# Patient Record
Sex: Female | Born: 1940 | Hispanic: No | Smoking: Never smoker
Health system: Southern US, Community
[De-identification: ages and names within clinical notes are randomized; demographics above are authoritative.]

## PROBLEM LIST (undated history)

## (undated) DIAGNOSIS — R531 Weakness: Secondary | ICD-10-CM

## (undated) DIAGNOSIS — D649 Anemia, unspecified: Secondary | ICD-10-CM

## (undated) DIAGNOSIS — R55 Syncope and collapse: Secondary | ICD-10-CM

## (undated) DIAGNOSIS — I10 Essential (primary) hypertension: Secondary | ICD-10-CM

## (undated) DIAGNOSIS — M1990 Unspecified osteoarthritis, unspecified site: Secondary | ICD-10-CM

## (undated) DIAGNOSIS — M810 Age-related osteoporosis without current pathological fracture: Secondary | ICD-10-CM

## (undated) DIAGNOSIS — N189 Chronic kidney disease, unspecified: Secondary | ICD-10-CM

## (undated) DIAGNOSIS — E119 Type 2 diabetes mellitus without complications: Secondary | ICD-10-CM

## (undated) DIAGNOSIS — R001 Bradycardia, unspecified: Secondary | ICD-10-CM

## (undated) DIAGNOSIS — H269 Unspecified cataract: Secondary | ICD-10-CM

## (undated) HISTORY — DX: Age-related osteoporosis without current pathological fracture: M81.0

## (undated) HISTORY — DX: Anemia, unspecified: D64.9

## (undated) HISTORY — PX: EYE SURGERY: SHX253

## (undated) HISTORY — DX: Essential (primary) hypertension: I10

## (undated) HISTORY — PX: COLON SURGERY: SHX602

## (undated) HISTORY — DX: Unspecified cataract: H26.9

## (undated) HISTORY — DX: Bradycardia, unspecified: R00.1

## (undated) HISTORY — DX: Chronic kidney disease, unspecified: N18.9

## (undated) HISTORY — DX: Unspecified osteoarthritis, unspecified site: M19.90

## (undated) HISTORY — DX: Weakness: R53.1

## (undated) HISTORY — PX: FRACTURE SURGERY: SHX138

## (undated) HISTORY — DX: Type 2 diabetes mellitus without complications: E11.9

## (undated) HISTORY — DX: Syncope and collapse: R55

## (undated) HISTORY — PX: ABDOMINAL HYSTERECTOMY: SHX81

---

## 2011-06-30 ENCOUNTER — Ambulatory Visit (INDEPENDENT_AMBULATORY_CARE_PROVIDER_SITE_OTHER): Payer: Medicare Other

## 2011-06-30 DIAGNOSIS — E119 Type 2 diabetes mellitus without complications: Secondary | ICD-10-CM

## 2011-06-30 DIAGNOSIS — E7800 Pure hypercholesterolemia, unspecified: Secondary | ICD-10-CM

## 2011-06-30 DIAGNOSIS — Z79899 Other long term (current) drug therapy: Secondary | ICD-10-CM

## 2011-06-30 DIAGNOSIS — I10 Essential (primary) hypertension: Secondary | ICD-10-CM

## 2011-07-22 ENCOUNTER — Ambulatory Visit (INDEPENDENT_AMBULATORY_CARE_PROVIDER_SITE_OTHER): Payer: Medicare Other | Admitting: Family Medicine

## 2011-07-22 VITALS — BP 157/72 | HR 53 | Temp 98.7°F | Resp 18 | Ht 65.5 in | Wt 197.8 lb

## 2011-07-22 DIAGNOSIS — I10 Essential (primary) hypertension: Secondary | ICD-10-CM | POA: Insufficient documentation

## 2011-07-22 DIAGNOSIS — E1165 Type 2 diabetes mellitus with hyperglycemia: Secondary | ICD-10-CM | POA: Insufficient documentation

## 2011-07-22 DIAGNOSIS — E1169 Type 2 diabetes mellitus with other specified complication: Secondary | ICD-10-CM | POA: Insufficient documentation

## 2011-07-22 DIAGNOSIS — E119 Type 2 diabetes mellitus without complications: Secondary | ICD-10-CM

## 2011-07-22 DIAGNOSIS — R5381 Other malaise: Secondary | ICD-10-CM

## 2011-07-22 DIAGNOSIS — R531 Weakness: Secondary | ICD-10-CM

## 2011-07-22 LAB — POCT CBC
Granulocyte percent: 38.4 %G (ref 37–80)
HCT, POC: 37.9 % (ref 37.7–47.9)
Hemoglobin: 11.8 g/dL — AB (ref 12.2–16.2)
Lymph, poc: 2.2 (ref 0.6–3.4)
MCH, POC: 26.4 pg — AB (ref 27–31.2)
MCHC: 31.1 g/dL — AB (ref 31.8–35.4)
MCV: 84.8 fL (ref 80–97)
MID (cbc): 0.2 (ref 0–0.9)
MPV: 13.8 fL (ref 0–99.8)
POC Granulocyte: 1.5 — AB (ref 2–6.9)
POC LYMPH PERCENT: 55.6 %L — AB (ref 10–50)
POC MID %: 6 %M (ref 0–12)
Platelet Count, POC: 140 10*3/uL — AB (ref 142–424)
RBC: 4.47 M/uL (ref 4.04–5.48)
RDW, POC: 15 %
WBC: 4 10*3/uL — AB (ref 4.6–10.2)

## 2011-07-22 LAB — POCT GLYCOSYLATED HEMOGLOBIN (HGB A1C): Hemoglobin A1C: 7

## 2011-07-22 MED ORDER — KETOCONAZOLE 2 % EX CREA: TOPICAL_CREAM | Freq: Two times a day (BID) | CUTANEOUS | Status: AC

## 2011-07-22 MED ORDER — GLIPIZIDE 5 MG PO TABS: 5.0000 mg | ORAL_TABLET | Freq: Every day | ORAL | Status: DC

## 2011-07-22 NOTE — Patient Instructions (Signed)
Weakness Weakness can be caused by many things. Your doctor has checked for the most common causes. HOME CARE  Rest.   Eat a well-balanced diet.   Exercise every day.   Go to follow-up appointments.  GET HELP RIGHT AWAY IF:   There is chest pain or chest pressure.   You have problems breathing.   You cannot do usual daily activities.   You have problems speaking or swallowing.   You have a very bad headache or belly (abdominal) pain.   The heartbeat does not feel normal or the pulse is very fast.   You are confused.   You have vision problems or problems walking.   You have chills.   You or your child has a temperature by mouth above 102 F (38.9 C), not controlled by medicine.   Your baby is older than 3 months with a rectal temperature of 102 F (38.9 C) or higher.   Your baby is 3 months old or younger with a rectal temperature of 100.4 F (38 C) or higher.  MAKE SURE YOU:   Understand these instructions.   Will watch this condition.   Will get help right away if you are not doing well or get worse.  Document Released: 05/20/2008 Document Revised: 02/17/2011 Document Reviewed: 05/20/2008 ExitCare Patient Information 2012 [employer]. 

## 2011-07-22 NOTE — Progress Notes (Signed)
This is a 71 yo woman from [place] who has been having weakness x 3 days with one fall two days ago.  Associated with polyuria, diabetes, hypertension. No chest pain, shortness of breath, bowel problems, abd pain .    Appetite is fine.   Taking meds as prescribed Seen with husband. Speaks [place]  She also has a rash on her face x 5-6 months:  Dry, scaly, getting worse.  O:  Overweight woman in NAD Facial rash:  Dry, scaly papular rash over malar area of face Edentulous Neck normal Chest clear Heart reg with bradycardia no murmur Ext: 1+ pretibial edema  CBC is essentially normal in woman who has always been slightly anemic HgBA1C is 7%  A:  Weakness may be related to diabetes, low potassium, hypothyroidism. Rash suspicious for tinea corporis  P:  Will check labs Hold Valsartan tonight Start Nizoral cream bid Follow up in a week

## 2011-07-24 LAB — COMPREHENSIVE METABOLIC PANEL
ALT: 16 U/L (ref 0–35)
AST: 22 U/L (ref 0–37)
Albumin: 4.1 g/dL (ref 3.5–5.2)
Alkaline Phosphatase: 79 U/L (ref 39–117)
BUN: 14 mg/dL (ref 6–23)
CO2: 27 mEq/L (ref 19–32)
Calcium: 9.1 mg/dL (ref 8.4–10.5)
Chloride: 101 mEq/L (ref 96–112)
Creat: 0.93 mg/dL (ref 0.50–1.10)
Glucose, Bld: 127 mg/dL — ABNORMAL HIGH (ref 70–99)
Potassium: 3.9 mEq/L (ref 3.5–5.3)
Sodium: 137 mEq/L (ref 135–145)
Total Bilirubin: 0.3 mg/dL (ref 0.3–1.2)
Total Protein: 7 g/dL (ref 6.0–8.3)

## 2011-07-24 LAB — TSH: TSH: 0.919 u[IU]/mL (ref 0.350–4.500)

## 2011-07-30 ENCOUNTER — Ambulatory Visit (INDEPENDENT_AMBULATORY_CARE_PROVIDER_SITE_OTHER): Payer: Medicare Other | Admitting: Family Medicine

## 2011-07-30 VITALS — BP 150/78 | HR 48 | Temp 98.2°F | Resp 16 | Ht 63.25 in | Wt 195.4 lb

## 2011-07-30 DIAGNOSIS — I498 Other specified cardiac arrhythmias: Secondary | ICD-10-CM

## 2011-07-30 DIAGNOSIS — R001 Bradycardia, unspecified: Secondary | ICD-10-CM

## 2011-07-30 DIAGNOSIS — R55 Syncope and collapse: Secondary | ICD-10-CM

## 2011-07-30 NOTE — Progress Notes (Signed)
Results for orders placed in visit on 07/22/11  COMPREHENSIVE METABOLIC PANEL      Component Value Range   Sodium 137  135 - 145 (mEq/L)   Potassium 3.9  3.5 - 5.3 (mEq/L)   Chloride 101  96 - 112 (mEq/L)   CO2 27  19 - 32 (mEq/L)   Glucose, Bld 127 (*) 70 - 99 (mg/dL)   BUN 14  6 - 23 (mg/dL)   Creat 1.47  8.29 - 5.62 (mg/dL)   Total Bilirubin 0.3  0.3 - 1.2 (mg/dL)   Alkaline Phosphatase 79  39 - 117 (U/L)   AST 22  0 - 37 (U/L)   ALT 16  0 - 35 (U/L)   Total Protein 7.0  6.0 - 8.3 (g/dL)   Albumin 4.1  3.5 - 5.2 (g/dL)   Calcium 9.1  8.4 - 13.0 (mg/dL)  POCT CBC      Component Value Range   WBC 4.0 (*) 4.6 - 10.2 (K/uL)   Lymph, poc 2.2  0.6 - 3.4    POC LYMPH PERCENT 55.6 (*) 10 - 50 (%L)   MID (cbc) 0.2  0 - 0.9    POC MID % 6.0  0 - 12 (%M)   POC Granulocyte 1.5 (*) 2 - 6.9    Granulocyte percent 38.4  37 - 80 (%G)   RBC 4.47  4.04 - 5.48 (M/uL)   Hemoglobin 11.8 (*) 12.2 - 16.2 (g/dL)   HCT, POC 86.5  78.4 - 47.9 (%)   MCV 84.8  80 - 97 (fL)   MCH, POC 26.4 (*) 27 - 31.2 (pg)   MCHC 31.1 (*) 31.8 - 35.4 (g/dL)   RDW, POC 69.6     Platelet Count, POC 140 (*) 142 - 424 (K/uL)   MPV 13.8  0 - 99.8 (fL)  POCT GLYCOSYLATED HEMOGLOBIN (HGB A1C)      Component Value Range   Hemoglobin A1C 7.0    TSH      Component Value Range   TSH 0.919  0.350 - 4.500 (uIU/mL)  This 71 yo woman from [place] with dizziness.  She gets orthostatic at times and has experienced syncope.  Mother has pacemaker  O:  NAD HEENT unremarkable Chest clear Heart bradycardic, no murmur Abdomen:  Soft, no hsm, nontender, no masses Ext 1+ edema LLE  A:  Bradycardia, symptomatic  Plan:  Cardiologist

## 2011-07-30 NOTE — Patient Instructions (Signed)
Bradycardia Bradycardia is a term for a heart rate (pulse) that, in adults, is slower than 60 beats per minute. A normal rate is 60 to 100 beats per minute. A heart rate below 60 beats per minute may be normal for some adults with healthy hearts. If the rate is too slow, the heart may have trouble pumping the volume of blood the body needs. If the heart rate gets too low, blood flow to the brain may be decreased and may make you feel lightheaded, dizzy, or faint. The heart has a natural pacemaker in the top of the heart called the SA node (sinoatrial or sinus node). This pacemaker sends out regular electrical signals to the muscle of the heart, telling the heart muscle when to beat (contract). The electrical signal travels from the upper parts of the heart (atria) through the AV node (atrioventricular node), to the lower chambers of the heart (ventricles). The ventricles squeeze, pumping the blood from your heart to your lungs and to the rest of your body. CAUSES   Problem with the heart's electrical system.   Problem with the heart's natural pacemaker.   Heart disease, damage, or infection.   Medications.   Problems with minerals and salts (electrolytes).  SYMPTOMS   Fainting (syncope).   Fatigue and weakness.   Shortness of breath (dyspnea).   Chest pain (angina).   Drowsiness.   Confusion.  DIAGNOSIS   An electrocardiogram (ECG) can help your caregiver determine the type of slow heart rate you have.   If the cause is not seen on an ECG, you may need to wear a heart monitor that records your heart rhythm for several hours or days.   Blood tests.  TREATMENT   Electrolyte supplements.   Medications.   Withholding medication which is causing a slow heart rate.   Pacemaker placement.  SEEK IMMEDIATE MEDICAL CARE IF:   You feel lightheaded or faint.   You develop an irregular heart rate.   You feel chest pain or have trouble breathing.  MAKE SURE YOU:   Understand  these instructions.   Will watch your condition.   Will get help right away if you are not doing well or get worse.  Document Released: 02/27/2002 Document Revised: 02/17/2011 Document Reviewed: 01/24/2008 ExitCare Patient Information 2012 [employer]. 

## 2011-08-02 ENCOUNTER — Encounter: Payer: Self-pay | Admitting: *Deleted

## 2011-08-03 ENCOUNTER — Encounter: Payer: Self-pay | Admitting: *Deleted

## 2011-08-03 ENCOUNTER — Ambulatory Visit (INDEPENDENT_AMBULATORY_CARE_PROVIDER_SITE_OTHER): Payer: Medicare Other | Admitting: Cardiovascular Disease

## 2011-08-03 VITALS — BP 143/81 | HR 59 | Ht 65.0 in | Wt 198.0 lb

## 2011-08-03 DIAGNOSIS — E782 Mixed hyperlipidemia: Secondary | ICD-10-CM

## 2011-08-03 DIAGNOSIS — I498 Other specified cardiac arrhythmias: Secondary | ICD-10-CM

## 2011-08-03 DIAGNOSIS — R001 Bradycardia, unspecified: Secondary | ICD-10-CM | POA: Insufficient documentation

## 2011-08-03 DIAGNOSIS — I10 Essential (primary) hypertension: Secondary | ICD-10-CM

## 2011-08-03 DIAGNOSIS — E119 Type 2 diabetes mellitus without complications: Secondary | ICD-10-CM

## 2011-08-03 DIAGNOSIS — R42 Dizziness and giddiness: Secondary | ICD-10-CM

## 2011-08-03 DIAGNOSIS — R55 Syncope and collapse: Secondary | ICD-10-CM

## 2011-08-03 NOTE — Assessment & Plan Note (Signed)
Will review ECG from [facility] ? HR 49  ECG today fine and not on any drugs to lower HR.  Event monitor Currently no obvious indication for pacer

## 2011-08-03 NOTE — Patient Instructions (Signed)
Your physician recommends that you schedule a follow-up appointment in: AFTER TESTS DONE  Your physician recommends that you continue on your current medications as directed. Please refer to the Current Medication list given to you today. Your physician has requested that you have a stress echocardiogram. For further information please visit [email or web address] Please follow instruction sheet as given. DX SYNCOPE Your physician has recommended that you wear an event monitor. Event monitors are medical devices that record the heart's electrical activity. Doctors most often [place] these monitors to diagnose arrhythmias. Arrhythmias are problems with the speed or rhythm of the heartbeat. The monitor is a small, portable device. You can wear one while you do your normal daily activities. This is usually used to diagnose what is causing palpitations/syncope (passing out). 21 DAY  MONITOR  DX SYNCOPE

## 2011-08-03 NOTE — Assessment & Plan Note (Signed)
Cholesterol is at goal.  Continue current dose of statin and diet Rx.  No myalgias or side effects.  F/U  LFT's in 6 months. No results found for this basename: LDLCALC             

## 2011-08-03 NOTE — Progress Notes (Signed)
Patient ID: Catherine Joseph, female   DOB: Oct 24, 1940, 71 y.o.   MRN: [id number] 71 yo [place] female referred by Dr Faustino Congress for bradycardia and dizzyness.  Two weeks ago was getting coffee for her husband and felt weak and dizzy.  Did not fall or hurt herself But felt dizzy for 5-6 minutes and then had a normal day.  Since then continues to feel weak all day and dizzy in the am.  No SSCP.  No previous w/u for structural heart disease.  No history of seizures or neurologic issues.  CRF;s DM/HTN and elevated lipiids.  No history of hypoglycemia.  Labs reviewed from 1/13 and TSH normal not anemic and HbA1c 7.1  ROS: Denies fever, malais, weight loss, blurry vision, decreased visual acuity, cough, sputum, SOB, hemoptysis, pleuritic pain, palpitaitons, heartburn, abdominal pain, melena, lower extremity edema, claudication, or rash.  All other systems reviewed and negative   General: Not postural Affect appropriate Elderly [place] female HEENT: normal Neck supple with no adenopathy JVP normal no bruits no thyromegaly Lungs clear with no wheezing and good diaphragmatic motion Heart:  S1/S2 no murmur,rub, gallop or click PMI normal Abdomen: benighn, BS positve, no tenderness, no AAA no bruit.  No HSM or HJR Distal pulses intact with no bruits No edema Neuro non-focal Skin warm and dry No muscular weakness  Medications Current Outpatient Prescriptions  Medication Sig Dispense Refill  . glipiZIDE (GLUCOTROL) 5 MG tablet Take 1 tablet (5 mg total) by mouth daily.  30 tablet  3  . ketoconazole (NIZORAL) 2 % cream Apply topically 2 (two) times daily.  30 g  0  . simvastatin (ZOCOR) 40 MG tablet Take 40 mg by mouth every evening.      . valsartan-hydrochlorothiazide (DIOVAN-HCT) 160-25 MG per tablet Take 1 tablet by mouth daily.        Allergies Codeine  Family History: No family history on file.  Social History: History   Social History  . Marital Status: Married    Spouse Name:  N/A    Number of Children: N/A  . Years of Education: N/A   Occupational History  . Not on file.   Social History Main Topics  . Smoking status: Never Smoker   . Smokeless tobacco: Not on file  . Alcohol Use: Not on file  . Drug Use: Not on file  . Sexually Active: Not on file   Other Topics Concern  . Not on file   Social History Narrative  . No narrative on file    Electrocardiogram:  NSR rate 69 PVC;s no conduction abnormalities and no ischemia  Assessment and Plan

## 2011-08-03 NOTE — Assessment & Plan Note (Signed)
Discussed low carb diet.  Target hemoglobin A1c is 6.5 or less.  Continue current medications.  

## 2011-08-03 NOTE — Assessment & Plan Note (Signed)
Not clear that these are related to her heart.  ECG in our office SR 69 with no conduction abnormality.  Will get event monitor, and stress echo.  Stress echo will allow [place] to see HR response to exercise and R/O structural heart disease in regard to her PVC;s.

## 2011-08-03 NOTE — Assessment & Plan Note (Signed)
Well controlled.  Continue current medications and low sodium Dash type diet.    

## 2011-08-13 ENCOUNTER — Encounter (INDEPENDENT_AMBULATORY_CARE_PROVIDER_SITE_OTHER): Payer: Medicare Other

## 2011-08-13 DIAGNOSIS — R55 Syncope and collapse: Secondary | ICD-10-CM

## 2011-08-20 ENCOUNTER — Other Ambulatory Visit (HOSPITAL_COMMUNITY): Payer: Medicare Other

## 2011-08-27 ENCOUNTER — Ambulatory Visit (HOSPITAL_COMMUNITY): Payer: Medicare Other | Attending: Cardiovascular Disease

## 2011-08-27 DIAGNOSIS — I498 Other specified cardiac arrhythmias: Secondary | ICD-10-CM | POA: Insufficient documentation

## 2011-08-27 DIAGNOSIS — I4949 Other premature depolarization: Secondary | ICD-10-CM

## 2011-08-27 DIAGNOSIS — R55 Syncope and collapse: Secondary | ICD-10-CM

## 2011-08-27 DIAGNOSIS — E119 Type 2 diabetes mellitus without complications: Secondary | ICD-10-CM | POA: Insufficient documentation

## 2011-08-27 DIAGNOSIS — I10 Essential (primary) hypertension: Secondary | ICD-10-CM | POA: Insufficient documentation

## 2011-08-27 DIAGNOSIS — R42 Dizziness and giddiness: Secondary | ICD-10-CM | POA: Insufficient documentation

## 2011-08-27 DIAGNOSIS — E785 Hyperlipidemia, unspecified: Secondary | ICD-10-CM | POA: Insufficient documentation

## 2011-09-13 ENCOUNTER — Telehealth: Payer: Self-pay | Admitting: *Deleted

## 2011-09-13 NOTE — Telephone Encounter (Signed)
PT AWARE OF MONITOR RESULTS SR,PAC ,PVC'S NO SIG ARRYTHMIA./CY

## 2011-09-27 ENCOUNTER — Encounter: Payer: Self-pay | Admitting: Internal Medicine

## 2011-09-27 ENCOUNTER — Ambulatory Visit (INDEPENDENT_AMBULATORY_CARE_PROVIDER_SITE_OTHER): Payer: Medicare Other | Admitting: Internal Medicine

## 2011-09-27 VITALS — BP 119/74 | HR 62 | Temp 97.9°F | Resp 16 | Ht 63.5 in | Wt 197.0 lb

## 2011-09-27 DIAGNOSIS — E119 Type 2 diabetes mellitus without complications: Secondary | ICD-10-CM

## 2011-09-27 DIAGNOSIS — Z79899 Other long term (current) drug therapy: Secondary | ICD-10-CM

## 2011-09-27 DIAGNOSIS — R001 Bradycardia, unspecified: Secondary | ICD-10-CM

## 2011-09-27 DIAGNOSIS — IMO0001 Reserved for inherently not codable concepts without codable children: Secondary | ICD-10-CM

## 2011-09-27 DIAGNOSIS — I10 Essential (primary) hypertension: Secondary | ICD-10-CM

## 2011-09-27 NOTE — Progress Notes (Signed)
  Subjective:    Patient ID: NALA KACHEL, female    DOB: Jun 23, 1940, 71 y.o.   MRN: [id number]  HPI New bradycardia and dizzy-- Dr. Genelle Gather is evaluating Niddm is stable HTN controlled  Meds reviewed  Review of Systems     Objective:   Physical Exam  Constitutional: She is oriented to person, place, and time. She appears well-developed and well-nourished.  HENT:  Head: Normocephalic.  Eyes: EOM are normal.  Neck: Normal range of motion. Neck supple. No thyromegaly present.  Cardiovascular: Normal rate, regular rhythm and normal heart sounds.   Pulmonary/Chest: Effort normal and breath sounds normal.  Abdominal: Soft. Bowel sounds are normal.  Neurological: She is alert and oriented to person, place, and time. Coordination normal.  Skin: Skin is warm and dry.  Psychiatric: She has a normal mood and affect.  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 RF meds 4 mos

## 2011-09-28 ENCOUNTER — Ambulatory Visit (INDEPENDENT_AMBULATORY_CARE_PROVIDER_SITE_OTHER): Payer: Medicare Other | Admitting: Cardiovascular Disease

## 2011-09-28 ENCOUNTER — Ambulatory Visit: Payer: Medicare Other | Admitting: Cardiovascular Disease

## 2011-09-28 ENCOUNTER — Encounter: Payer: Self-pay | Admitting: Cardiovascular Disease

## 2011-09-28 VITALS — BP 134/62 | HR 56 | Ht 63.0 in | Wt 197.8 lb

## 2011-09-28 DIAGNOSIS — R001 Bradycardia, unspecified: Secondary | ICD-10-CM

## 2011-09-28 DIAGNOSIS — E782 Mixed hyperlipidemia: Secondary | ICD-10-CM

## 2011-09-28 DIAGNOSIS — E119 Type 2 diabetes mellitus without complications: Secondary | ICD-10-CM

## 2011-09-28 DIAGNOSIS — I498 Other specified cardiac arrhythmias: Secondary | ICD-10-CM

## 2011-09-28 DIAGNOSIS — I10 Essential (primary) hypertension: Secondary | ICD-10-CM

## 2011-09-28 NOTE — Assessment & Plan Note (Signed)
Cholesterol is at goal.  Continue current dose of statin and diet Rx.  No myalgias or side effects.  F/U  LFT's in 6 months. No results found for this basename: LDLCALC             

## 2011-09-28 NOTE — Patient Instructions (Signed)
Your physician wants you to follow-up in:  6 MONTHS WITH DR NISHAN  You will receive a reminder letter in the mail two months in advance. If you don't receive a letter, please call our office to schedule the follow-up appointment. Your physician recommends that you continue on your current medications as directed. Please refer to the Current Medication list given to you today. 

## 2011-09-28 NOTE — Assessment & Plan Note (Signed)
Only checked BS once/week.  F/U Dr Perrin Maltese Continue oral hypoglycemics

## 2011-09-28 NOTE — Assessment & Plan Note (Signed)
Pulse deficit at radial from PAC/PVC  Event monitor benign and no episodes of dizzyness. Observe

## 2011-09-28 NOTE — Assessment & Plan Note (Signed)
Well controlled.  Continue current medications and low sodium Dash type diet.    

## 2011-09-28 NOTE — Progress Notes (Signed)
71 yo [place] female referred by Dr Faustino Congress 2/13  for bradycardia and dizzyness. Two weeks ago was getting coffee for her husband and felt weak and dizzy. Did not fall or hurt herself   But felt dizzy for 5-6 minutes and then had a normal day. Since then continues to feel weak all day and dizzy in the am. No SSCP. No previous w/u for structural heart disease. No history of seizures or neurologic issues. CRF;s DM/HTN and elevated lipiids. No history of hypoglycemia. Labs reviewed from 1/13 and TSH normal not anemic and HbA1c 7.1  Normal stress echo 08/27/11 Reviewed event monitor SR PAC/PVC no significant arrhythmia  ROS: Denies fever, malais, weight loss, blurry vision, decreased visual acuity, cough, sputum, SOB, hemoptysis, pleuritic pain, palpitaitons, heartburn, abdominal pain, melena, lower extremity edema, claudication, or rash.  All other systems reviewed and negative  General: Affect appropriate Healthy:  appears stated age HEENT: normal Neck supple with no adenopathy JVP normal no bruits no thyromegaly Lungs clear with no wheezing and good diaphragmatic motion Heart:  S1/S2 no murmur, no rub, gallop or click PMI normal Abdomen: benighn, BS positve, no tenderness, no AAA no bruit.  No HSM or HJR Distal pulses intact with no bruits No edema Neuro non-focal Skin warm and dry No muscular weakness   Current Outpatient Prescriptions  Medication Sig Dispense Refill  . glipiZIDE (GLUCOTROL) 5 MG tablet Take 1 tablet (5 mg total) by mouth daily.  30 tablet  3  . ketoconazole (NIZORAL) 2 % cream Apply topically 2 (two) times daily.  30 g  0  . simvastatin (ZOCOR) 40 MG tablet Take 40 mg by mouth every evening.      . valsartan-hydrochlorothiazide (DIOVAN-HCT) 160-25 MG per tablet Take 1 tablet by mouth daily.        Allergies  Codeine  Electrocardiogram:  Assessment and Plan

## 2011-12-06 ENCOUNTER — Other Ambulatory Visit: Payer: Self-pay | Admitting: Internal Medicine

## 2012-01-19 ENCOUNTER — Ambulatory Visit (INDEPENDENT_AMBULATORY_CARE_PROVIDER_SITE_OTHER): Payer: Medicare Other | Admitting: Emergency Medicine

## 2012-01-19 ENCOUNTER — Ambulatory Visit: Payer: Medicare Other

## 2012-01-19 VITALS — BP 140/82 | HR 66 | Temp 99.3°F | Resp 16 | Ht 63.0 in | Wt 201.0 lb

## 2012-01-19 DIAGNOSIS — R05 Cough: Secondary | ICD-10-CM

## 2012-01-19 DIAGNOSIS — J329 Chronic sinusitis, unspecified: Secondary | ICD-10-CM

## 2012-01-19 DIAGNOSIS — I517 Cardiomegaly: Secondary | ICD-10-CM

## 2012-01-19 DIAGNOSIS — R059 Cough, unspecified: Secondary | ICD-10-CM

## 2012-01-19 DIAGNOSIS — J309 Allergic rhinitis, unspecified: Secondary | ICD-10-CM

## 2012-01-19 MED ORDER — FLUTICASONE PROPIONATE 50 MCG/ACT NA SUSP: 2.0000 | Freq: Every day | NASAL | Status: DC

## 2012-01-19 MED ORDER — BENZONATATE 100 MG PO CAPS: 100.0000 mg | ORAL_CAPSULE | Freq: Three times a day (TID) | ORAL | Status: AC | PRN

## 2012-01-19 MED ORDER — AMOXICILLIN 500 MG PO CAPS: 500.0000 mg | ORAL_CAPSULE | Freq: Two times a day (BID) | ORAL | Status: AC

## 2012-01-19 MED ORDER — BENZONATATE 100 MG PO CAPS: 100.0000 mg | ORAL_CAPSULE | Freq: Three times a day (TID) | ORAL | Status: DC | PRN

## 2012-01-19 MED ORDER — AMOXICILLIN 500 MG PO CAPS: 500.0000 mg | ORAL_CAPSULE | Freq: Two times a day (BID) | ORAL | Status: DC

## 2012-01-19 NOTE — Progress Notes (Signed)
  Subjective:    Patient ID: Catherine Joseph, female    DOB: June 24, 1940, 71 y.o.   MRN: [id number]  HPI patient states that 8 days ago she started developing symptoms of head congestion associated with postnasal drip and a dry cough the she now has swelling across her face and discomfort when she blows her nose. She denies a productive cough but states she has been running some fever. She has not taken any medications because she has diabetes high blood in her cholesterol to    Review of Systems     Objective:   Physical Exam  Constitutional: She appears well-developed and well-nourished.  HENT:  Head: Normocephalic.  Right Ear: External ear normal.  Left Ear: External ear normal.       She is a significant amount of nasal congestion. I did not see any purulent drainage  Eyes: Pupils are equal, round, and reactive to light.  Neck: No tracheal deviation present.  Pulmonary/Chest: Effort normal and breath sounds normal.    [facility] reading (PRIMARY) by  Dr. Cleta Alberts there is cardiomegaly there is a prominent node in the right hilar area. There is no definite evidence of sinusitis .       Assessment & Plan:   We'll go ahead and cover with amoxicillin and Flonase and Tessalon Perles

## 2012-01-21 ENCOUNTER — Other Ambulatory Visit: Payer: Self-pay | Admitting: Radiology

## 2012-01-21 DIAGNOSIS — R9389 Abnormal findings on diagnostic imaging of other specified body structures: Secondary | ICD-10-CM

## 2012-01-25 ENCOUNTER — Other Ambulatory Visit: Payer: Self-pay | Admitting: Emergency Medicine

## 2012-01-27 ENCOUNTER — Other Ambulatory Visit: Payer: Medicare Other

## 2012-01-27 ENCOUNTER — Ambulatory Visit
Admission: RE | Admit: 2012-01-27 | Discharge: 2012-01-27 | Disposition: A | Discharge status: Home / Self Care | Payer: Medicare Other | Source: Ambulatory Visit | Attending: Emergency Medicine | Admitting: Emergency Medicine

## 2012-01-27 DIAGNOSIS — R9389 Abnormal findings on diagnostic imaging of other specified body structures: Secondary | ICD-10-CM

## 2012-01-27 MED ORDER — IOHEXOL 300 MG/ML  SOLN
75.0000 mL | Freq: Once | INTRAMUSCULAR | Status: AC | PRN
  Administered 2012-01-27: 75 mL via INTRAVENOUS

## 2012-01-31 ENCOUNTER — Other Ambulatory Visit: Payer: Self-pay | Admitting: Radiology

## 2012-01-31 DIAGNOSIS — R9389 Abnormal findings on diagnostic imaging of other specified body structures: Secondary | ICD-10-CM

## 2012-02-01 ENCOUNTER — Ambulatory Visit (INDEPENDENT_AMBULATORY_CARE_PROVIDER_SITE_OTHER): Payer: Medicare Other | Admitting: Internal Medicine

## 2012-02-01 VITALS — BP 144/82 | HR 72 | Temp 98.9°F | Resp 17 | Ht 63.0 in | Wt 200.0 lb

## 2012-02-01 DIAGNOSIS — R059 Cough, unspecified: Secondary | ICD-10-CM

## 2012-02-01 DIAGNOSIS — R05 Cough: Secondary | ICD-10-CM

## 2012-02-01 DIAGNOSIS — R911 Solitary pulmonary nodule: Secondary | ICD-10-CM

## 2012-02-01 DIAGNOSIS — M546 Pain in thoracic spine: Secondary | ICD-10-CM

## 2012-02-01 MED ORDER — HYDROCODONE-ACETAMINOPHEN 7.5-500 MG/15ML PO SOLN: 5.0000 mL | Freq: Four times a day (QID) | ORAL | Status: AC | PRN

## 2012-02-01 NOTE — Progress Notes (Signed)
  Subjective:    Patient ID: Catherine Joseph, female    DOB: 21-Feb-1941, 71 y.o.   MRN: [id number]  HPI Has thoracic back pain on the right, recovering from a bad cough. 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 Counseled at length on nodule and low risk.   Review of Systems     Objective:   Physical Exam        Assessment & Plan:

## 2012-03-08 ENCOUNTER — Other Ambulatory Visit: Payer: Self-pay | Admitting: Physician Assistant

## 2012-03-08 ENCOUNTER — Telehealth: Payer: Self-pay

## 2012-03-08 NOTE — Telephone Encounter (Signed)
PATIENT CALLED REQUESTING REFILL ON PRESCRIPTION (valsartan-hydrochlorothiazide) THAT SHE WOULD LIKE REFILLED TOMORROW BECAUSE SHE RAN OUT. PLEASE CONTACT PATIENT AT HOME NUMBER. THANK YOU!

## 2012-03-08 NOTE — Telephone Encounter (Signed)
Medication refill sent in. 

## 2012-03-09 NOTE — Telephone Encounter (Signed)
Notified pt RF was sent in

## 2012-03-31 ENCOUNTER — Ambulatory Visit (INDEPENDENT_AMBULATORY_CARE_PROVIDER_SITE_OTHER): Payer: Medicare Other | Admitting: Family Medicine

## 2012-03-31 DIAGNOSIS — Z23 Encounter for immunization: Secondary | ICD-10-CM

## 2012-05-01 ENCOUNTER — Encounter: Payer: Self-pay | Admitting: Internal Medicine

## 2012-05-01 ENCOUNTER — Ambulatory Visit (INDEPENDENT_AMBULATORY_CARE_PROVIDER_SITE_OTHER): Payer: Medicare Other | Admitting: Internal Medicine

## 2012-05-01 ENCOUNTER — Other Ambulatory Visit: Payer: Self-pay | Admitting: Physician Assistant

## 2012-05-01 VITALS — BP 130/76 | HR 44 | Temp 97.9°F | Resp 16 | Ht 63.0 in | Wt 196.8 lb

## 2012-05-01 DIAGNOSIS — H6120 Impacted cerumen, unspecified ear: Secondary | ICD-10-CM

## 2012-05-01 DIAGNOSIS — B353 Tinea pedis: Secondary | ICD-10-CM

## 2012-05-01 DIAGNOSIS — Z23 Encounter for immunization: Secondary | ICD-10-CM

## 2012-05-01 DIAGNOSIS — D509 Iron deficiency anemia, unspecified: Secondary | ICD-10-CM

## 2012-05-01 DIAGNOSIS — Z79899 Other long term (current) drug therapy: Secondary | ICD-10-CM

## 2012-05-01 DIAGNOSIS — Z0000 Encounter for general adult medical examination without abnormal findings: Secondary | ICD-10-CM

## 2012-05-01 DIAGNOSIS — E119 Type 2 diabetes mellitus without complications: Secondary | ICD-10-CM

## 2012-05-01 DIAGNOSIS — Z139 Encounter for screening, unspecified: Secondary | ICD-10-CM

## 2012-05-01 DIAGNOSIS — T169XXA Foreign body in ear, unspecified ear, initial encounter: Secondary | ICD-10-CM

## 2012-05-01 DIAGNOSIS — R911 Solitary pulmonary nodule: Secondary | ICD-10-CM

## 2012-05-01 DIAGNOSIS — E782 Mixed hyperlipidemia: Secondary | ICD-10-CM

## 2012-05-01 DIAGNOSIS — I4949 Other premature depolarization: Secondary | ICD-10-CM

## 2012-05-01 DIAGNOSIS — M795 Residual foreign body in soft tissue: Secondary | ICD-10-CM

## 2012-05-01 DIAGNOSIS — R21 Rash and other nonspecific skin eruption: Secondary | ICD-10-CM

## 2012-05-01 DIAGNOSIS — I10 Essential (primary) hypertension: Secondary | ICD-10-CM

## 2012-05-01 LAB — POCT UA - MICROSCOPIC ONLY: Bacteria, U Microscopic: NEGATIVE

## 2012-05-01 LAB — POCT URINALYSIS DIPSTICK
Bilirubin, UA: NEGATIVE
Glucose, UA: NEGATIVE
Ketones, UA: NEGATIVE
Spec Grav, UA: 1.025

## 2012-05-01 LAB — CBC WITH DIFFERENTIAL/PLATELET
Basophils Absolute: 0 10*3/uL (ref 0.0–0.1)
HCT: 40.9 % (ref 36.0–46.0)
Hemoglobin: 14.1 g/dL (ref 12.0–15.0)
Lymphocytes Relative: 64 % — ABNORMAL HIGH (ref 12–46)
Monocytes Absolute: 0.3 10*3/uL (ref 0.1–1.0)
Monocytes Relative: 8 % (ref 3–12)
Neutro Abs: 0.9 10*3/uL — ABNORMAL LOW (ref 1.7–7.7)
RDW: 14.2 % (ref 11.5–15.5)
WBC: 3.5 10*3/uL — ABNORMAL LOW (ref 4.0–10.5)

## 2012-05-01 LAB — POCT GLYCOSYLATED HEMOGLOBIN (HGB A1C): Hemoglobin A1C: 8.3

## 2012-05-01 LAB — COMPREHENSIVE METABOLIC PANEL
AST: 17 U/L (ref 0–37)
Albumin: 4.2 g/dL (ref 3.5–5.2)
BUN: 17 mg/dL (ref 6–23)
Calcium: 9.4 mg/dL (ref 8.4–10.5)
Chloride: 102 mEq/L (ref 96–112)
Potassium: 3.8 mEq/L (ref 3.5–5.3)
Total Protein: 7.5 g/dL (ref 6.0–8.3)

## 2012-05-01 LAB — LIPID PANEL
LDL Cholesterol: 127 mg/dL — ABNORMAL HIGH (ref 0–99)
Triglycerides: 99 mg/dL (ref <150)

## 2012-05-01 MED ORDER — TERBINAFINE HCL 250 MG PO TABS: 250.0000 mg | ORAL_TABLET | Freq: Every day | ORAL | Status: DC

## 2012-05-01 MED ORDER — METFORMIN HCL ER 500 MG PO TB24: 500.0000 mg | ORAL_TABLET | Freq: Every day | ORAL | Status: DC

## 2012-05-01 MED ORDER — VALSARTAN-HYDROCHLOROTHIAZIDE 160-25 MG PO TABS: 1.0000 | ORAL_TABLET | Freq: Every day | ORAL | Status: DC

## 2012-05-01 NOTE — Patient Instructions (Signed)
Athlete's Foot  Athlete's foot (tinea pedis) is a fungal infection of the skin on the feet. It often occurs on the skin between the toes or underneath the toes. It can also occur on the soles of the feet. Athlete's foot is more likely to occur in hot, humid weather. Not washing your feet or changing your socks often enough can contribute to athlete's foot. The infection can spread from person to person (contagious).  CAUSES  Athlete's foot is caused by a fungus. This fungus thrives in warm, moist places. Most people get athlete's foot by sharing shower stalls, towels, and wet floors with an infected person. People with weakened immune systems, including those with diabetes, may be more likely to get athlete's foot.  SYMPTOMS     Itchy areas between the toes or on the soles of the feet.   White, flaky, or scaly areas between the toes or on the soles of the feet.   Tiny, intensely itchy blisters between the toes or on the soles of the feet.   Tiny cuts on the skin. These cuts can develop a bacterial infection.   Thick or discolored toenails.  DIAGNOSIS    Your caregiver can usually tell what the problem is by doing a physical exam. Your caregiver may also take a skin sample from the rash area. The skin sample may be examined under a microscope, or it may be tested to see if fungus will grow in the sample. A sample may also be taken from your toenail for testing.  TREATMENT    Over-the-counter and prescription medicines can be used to kill the fungus. These medicines are available as powders or creams. Your caregiver can suggest medicines for you. Fungal infections respond slowly to treatment. You may need to continue using your medicine for several weeks.  PREVENTION     Do not share towels.   Wear sandals in wet areas, such as shared locker rooms and shared showers.   Keep your feet dry. Wear shoes that allow air to circulate. Wear cotton or wool socks.  HOME CARE INSTRUCTIONS      Take medicines as directed by your caregiver. Do not use steroid creams on athlete's foot.   Keep your feet clean and cool. Wash your feet daily and dry them thoroughly, especially between your toes.   Change your socks every day. Wear cotton or wool socks. In hot climates, you may need to change your socks 2 to 3 times per day.   Wear sandals or canvas tennis shoes with good air circulation.   If you have blisters, soak your feet in Burow's solution or Epsom salts for 20 to 30 minutes, 2 times a day to dry out the blisters. Make sure you dry your feet thoroughly afterward.  SEEK MEDICAL CARE IF:     You have a fever.   You have swelling, soreness, warmth, or redness in your foot.   You are not getting better after 7 days of treatment.   You are not completely cured after 30 days.   You have any problems caused by your medicines.  MAKE SURE YOU:     Understand these instructions.   Will watch your condition.   Will get help right away if you are not doing well or get worse.  Document Released: 06/04/2000 Document Revised: 08/30/2011 Document Reviewed: 03/26/2011  ExitCare Patient Information 2013 [employer].

## 2012-05-01 NOTE — Progress Notes (Signed)
Subjective:    Patient ID: Catherine Joseph, female    DOB: 29-Jan-1941, 71 y.o.   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 Needs shingles and pneumonia vacs. Last colonoscopy13 yrs ago Had some nausea with metformin in past but is willing to try low dose again. See screening forms scanned   Review of Systems  Constitutional: Negative.   HENT: Negative.   Eyes: Negative.   Respiratory: Negative.   Genitourinary: Negative.   Musculoskeletal: Positive for arthralgias.  Neurological: Positive for dizziness. Negative for numbness.  Hematological: Negative.   Psychiatric/Behavioral: Negative.        Objective:   Physical Exam  Constitutional: She is oriented to person, place, and time. She appears well-nourished.  HENT:  Right Ear: Tympanic membrane normal. No swelling or tenderness. A foreign body is present. Decreased hearing is noted.  Left Ear: Tympanic membrane normal. No swelling or tenderness. A foreign body is present. Decreased hearing is noted.  Eyes: EOM are normal. Pupils are equal, round, and reactive to light. No scleral icterus.  Neck: Normal range of motion. No tracheal deviation present. No thyromegaly present.  Cardiovascular: Normal rate, regular rhythm and normal heart sounds.   Pulmonary/Chest: Effort normal and breath sounds normal. She exhibits no tenderness.  Abdominal: Soft. Bowel sounds are normal. She exhibits no mass. There is no tenderness.  Genitourinary: No breast swelling, tenderness, discharge or bleeding.       Rectal exam normal no masses, hemosure done  Musculoskeletal: She exhibits tenderness.  Lymphadenopathy:    She has no cervical adenopathy.       Right: No inguinal adenopathy present.       Left: No inguinal adenopathy present.  Neurological: She is alert and oriented to person, place, and time. She has normal strength and normal reflexes. No sensory deficit. She exhibits normal muscle tone. She  displays a negative Romberg sign. Coordination normal.  Skin: Rash noted.  Psychiatric: She has a normal mood and affect. Her behavior is normal. Judgment and thought content normal.   Has markedly scaly feet, no broken skin or wounds, itchy  rx terbinafin oral Facial bumps present months refer to dermatology.  Results for orders placed in visit on 05/01/12  POCT UA - MICROSCOPIC ONLY      Component Value Range   WBC, Ur, HPF, POC 0-5     RBC, urine, microscopic 0-4     Bacteria, U Microscopic neg     Mucus, UA trace     Epithelial cells, urine per micros 0-5     Crystals, Ur, HPF, POC neg     Casts, Ur, LPF, POC neg     Yeast, UA neg    POCT URINALYSIS DIPSTICK      Component Value Range   Color, UA yellow     Clarity, UA clear     Glucose, UA neg     Bilirubin, UA neg     Ketones, UA neg     Spec Grav, UA 1.025     Blood, UA small     pH, UA 6.5     Protein, UA neg     Urobilinogen, UA 0.2     Nitrite, UA neg     Leukocytes, UA Trace    POCT GLYCOSYLATED HEMOGLOBIN (HGB A1C)      Component Value Range   Hemoglobin A1C 8.3    GLUCOSE, POCT (MANUAL RESULT ENTRY)  Component Value Range   POC Glucose 143 (*) 70 - 99 mg/dl       Assessment & Plan:  Vaccines given Terbinafin 250mg  qd 1 month  Epocrates rev for drug interactions/sed RF all meds 1 yr Start metformin 250mg  bid for better diabetes control Refer for colonocopy and to dermatology

## 2012-05-01 NOTE — Progress Notes (Signed)
  Subjective:    Patient ID: Catherine Joseph, female    DOB: [birth year], 71 y.o.   MRN: [id number]  HPI    Review of Systems  Constitutional: Positive for fatigue and unexpected weight change.  HENT: Positive for facial swelling.   Eyes: Positive for itching.  Respiratory: Negative.   Cardiovascular: Positive for leg swelling.  Gastrointestinal: Negative.   Genitourinary: Positive for frequency.  Musculoskeletal: Positive for back pain, joint swelling and arthralgias.  Skin: Negative.   Neurological: Negative.   Hematological: Negative.   Psychiatric/Behavioral: Negative.        Objective:   Physical Exam        Assessment & Plan:

## 2012-07-05 ENCOUNTER — Other Ambulatory Visit: Payer: Self-pay | Admitting: Internal Medicine

## 2012-07-10 ENCOUNTER — Ambulatory Visit: Payer: Medicare Other | Admitting: Internal Medicine

## 2012-07-28 ENCOUNTER — Other Ambulatory Visit: Payer: Medicare Other

## 2012-07-28 ENCOUNTER — Telehealth: Payer: Self-pay | Admitting: Family Medicine

## 2012-07-28 NOTE — Telephone Encounter (Signed)
Called pt and encouraged her to reschedule CT Chest, and that it was recommended that she have a repeat CT in 6 - 12 mos. Gave pt [facility] phone # and she agreed to call to reschedule.

## 2012-07-28 NOTE — Telephone Encounter (Signed)
Catherine Joseph from [facility] called to let us know this patient was a no show for CT Chest today

## 2012-07-28 NOTE — Telephone Encounter (Signed)
Call patient and encourage her that I would advise her to reschedule her CT of the chest

## 2012-07-28 NOTE — Telephone Encounter (Signed)
Clydie Braun from [facility] called to let you know this patient was a no show for CT Chest today

## 2012-07-31 ENCOUNTER — Ambulatory Visit (INDEPENDENT_AMBULATORY_CARE_PROVIDER_SITE_OTHER): Payer: Medicare Other | Admitting: Internal Medicine

## 2012-07-31 ENCOUNTER — Encounter: Payer: Self-pay | Admitting: Internal Medicine

## 2012-07-31 VITALS — BP 132/76 | HR 48 | Temp 97.8°F | Resp 16 | Ht 62.75 in | Wt 193.2 lb

## 2012-07-31 DIAGNOSIS — Z79899 Other long term (current) drug therapy: Secondary | ICD-10-CM

## 2012-07-31 DIAGNOSIS — I10 Essential (primary) hypertension: Secondary | ICD-10-CM

## 2012-07-31 DIAGNOSIS — I493 Ventricular premature depolarization: Secondary | ICD-10-CM | POA: Insufficient documentation

## 2012-07-31 DIAGNOSIS — R6889 Other general symptoms and signs: Secondary | ICD-10-CM

## 2012-07-31 DIAGNOSIS — E119 Type 2 diabetes mellitus without complications: Secondary | ICD-10-CM

## 2012-07-31 LAB — COMPREHENSIVE METABOLIC PANEL
ALT: 13 U/L (ref 0–35)
AST: 15 U/L (ref 0–37)
Albumin: 4.1 g/dL (ref 3.5–5.2)
Calcium: 9.2 mg/dL (ref 8.4–10.5)
Chloride: 103 mEq/L (ref 96–112)
Potassium: 4.2 mEq/L (ref 3.5–5.3)
Sodium: 142 mEq/L (ref 135–145)
Total Protein: 7.4 g/dL (ref 6.0–8.3)

## 2012-07-31 NOTE — Patient Instructions (Signed)

## 2012-07-31 NOTE — Progress Notes (Signed)
  Subjective:    Patient ID: CLORINE SWING, female    DOB: 1940-11-05, 72 y.o.   MRN: [id number]  HPI NIDDM well controlled  HTN well controlled Tolerates meds. Had full cardiology eval with stress echo and event monitor, etc with Dr. Juliann Pares last year. EKG today sinus bradycardia and unifocal pvcs.   Review of Systems     Objective:   Physical Exam  Vitals reviewed. Constitutional: She is oriented to person, place, and time. She appears well-developed and well-nourished.  Cardiovascular: Normal rate, regular rhythm and normal heart sounds.   Pulmonary/Chest: Effort normal and breath sounds normal.  Abdominal: Soft. Bowel sounds are normal.  Neurological: She is alert and oriented to person, place, and time. No sensory deficit. Coordination and gait normal.  Psychiatric: She has a normal mood and affect.     Results for orders placed in visit on 07/31/12  POCT GLYCOSYLATED HEMOGLOBIN (HGB A1C)      Result Value Range   Hemoglobin A1C 6.8    GLUCOSE, POCT (MANUAL RESULT ENTRY)      Result Value Range   POC Glucose 154 (*) 70 - 99 mg/dl        Assessment & Plan:  NIDDM and HTN controlled RF meds 1 year.

## 2012-08-02 ENCOUNTER — Encounter: Payer: Self-pay | Admitting: *Deleted

## 2012-08-08 ENCOUNTER — Other Ambulatory Visit: Payer: Self-pay | Admitting: Physician Assistant

## 2012-08-08 ENCOUNTER — Other Ambulatory Visit: Payer: Self-pay | Admitting: Internal Medicine

## 2012-08-09 ENCOUNTER — Other Ambulatory Visit: Payer: Self-pay | Admitting: Physician Assistant

## 2012-08-09 NOTE — Telephone Encounter (Signed)
Per last OV note from Dr. Perrin Maltese 07/31/2012, RF meds for 1 yr

## 2012-09-05 ENCOUNTER — Other Ambulatory Visit: Payer: Self-pay | Admitting: Internal Medicine

## 2012-09-05 NOTE — Telephone Encounter (Signed)
PT CALLED AND NEEDS A REFILL OF THE MEDICINE SHE USES FOR ITCHING. SHE USED THE [facility] ON [address]  [phone]

## 2012-09-05 NOTE — Telephone Encounter (Signed)
Forward to Dr. Perrin Maltese.

## 2012-09-09 ENCOUNTER — Other Ambulatory Visit: Payer: Self-pay | Admitting: Internal Medicine

## 2012-09-14 NOTE — Telephone Encounter (Signed)
RTC

## 2012-09-19 ENCOUNTER — Ambulatory Visit (INDEPENDENT_AMBULATORY_CARE_PROVIDER_SITE_OTHER): Payer: Medicare Other | Admitting: Internal Medicine

## 2012-09-19 VITALS — BP 138/80 | HR 56 | Temp 98.1°F | Resp 18 | Ht 63.0 in | Wt 195.8 lb

## 2012-09-19 DIAGNOSIS — N760 Acute vaginitis: Secondary | ICD-10-CM

## 2012-09-19 DIAGNOSIS — L293 Anogenital pruritus, unspecified: Secondary | ICD-10-CM

## 2012-09-19 DIAGNOSIS — N898 Other specified noninflammatory disorders of vagina: Secondary | ICD-10-CM

## 2012-09-19 DIAGNOSIS — E119 Type 2 diabetes mellitus without complications: Secondary | ICD-10-CM

## 2012-09-19 LAB — POCT WET PREP WITH KOH: Clue Cells Wet Prep HPF POC: NEGATIVE

## 2012-09-19 LAB — GLUCOSE, POCT (MANUAL RESULT ENTRY): POC Glucose: 157 mg/dl — AB (ref 70–99)

## 2012-09-19 MED ORDER — FLUCONAZOLE 150 MG PO TABS: 150.0000 mg | ORAL_TABLET | Freq: Once | ORAL | Status: DC

## 2012-09-19 NOTE — Patient Instructions (Signed)
Monilial Vaginitis Vaginitis in a soreness, swelling and redness (inflammation) of the vagina and vulva. Monilial vaginitis is not a sexually transmitted infection. CAUSES  Yeast vaginitis is caused by yeast (candida) that is normally found in your vagina. With a yeast infection, the candida has overgrown in number to a point that upsets the chemical balance. SYMPTOMS   White, thick vaginal discharge.  Swelling, itching, redness and irritation of the vagina and possibly the lips of the vagina (vulva).  Burning or painful urination.  Painful intercourse. DIAGNOSIS  Things that may contribute to monilial vaginitis are:  Postmenopausal and virginal states.  Pregnancy.  Infections.  Being tired, sick or stressed, especially if you had monilial vaginitis in the past.  Diabetes. Good control will help lower the chance.  Birth control pills.  Tight fitting garments.  Using bubble bath, feminine sprays, douches or deodorant tampons.  Taking certain medications that kill germs (antibiotics).  Sporadic recurrence can occur if you become ill. TREATMENT  Your caregiver will give you medication.  There are several kinds of anti monilial vaginal creams and suppositories specific for monilial vaginitis. For recurrent yeast infections, use a suppository or cream in the vagina 2 times a week, or as directed.  Anti-monilial or steroid cream for the itching or irritation of the vulva may also be used. Get your caregiver's permission.  Painting the vagina with methylene blue solution may help if the monilial cream does not work.  Eating yogurt may help prevent monilial vaginitis. HOME CARE INSTRUCTIONS   Finish all medication as prescribed.  Do not have sex until treatment is completed or after your caregiver tells you it is okay.  Take warm sitz baths.  Do not douche.  Do not use tampons, especially scented ones.  Wear cotton underwear.  Avoid tight pants and panty  hose.  Tell your sexual partner that you have a yeast infection. They should go to their caregiver if they have symptoms such as mild rash or itching.  Your sexual partner should be treated as well if your infection is difficult to eliminate.  Practice safer sex. Use condoms.  Some vaginal medications cause latex condoms to fail. Vaginal medications that harm condoms are:  Cleocin cream.  Butoconazole (Femstat).  Terconazole (Terazol) vaginal suppository.  Miconazole (Monistat) (may be purchased over the counter). SEEK MEDICAL CARE IF:   You have a temperature by mouth above 102 F (38.9 C).  The infection is getting worse after 2 days of treatment.  The infection is not getting better after 3 days of treatment.  You develop blisters in or around your vagina.  You develop vaginal bleeding, and it is not your menstrual period.  You have pain when you urinate.  You develop intestinal problems.  You have pain with sexual intercourse. Document Released: 03/17/2005 Document Revised: 08/30/2011 Document Reviewed: 11/29/2008 Exit[employer] Patient Information 2013 [employer].  

## 2012-09-19 NOTE — Progress Notes (Signed)
  Subjective:    Patient ID: Catherine Joseph, female    DOB: [birth year], 72 y.o.   MRN: [id number]  HPI Diabetes doing well. Simvastatin 40mg  makes her nauseas, will reduce to 20mg  Has itch vaginal area, no rash.   Review of Systems     Objective:   Physical Exam  Vitals reviewed. Constitutional: She is oriented to person, place, and time. She appears well-developed and well-nourished.  Eyes: EOM are normal.  Cardiovascular: Normal rate, regular rhythm and normal heart sounds.   Pulmonary/Chest: Effort normal and breath sounds normal.  Genitourinary: Vagina normal. No vaginal discharge found.  Neurological: She is alert and oriented to person, place, and time. No sensory deficit. Coordination normal.  Psychiatric: She has a normal mood and affect.     Results for orders placed in visit on 09/19/12  POCT GLYCOSYLATED HEMOGLOBIN (HGB A1C)      Result Value Range   Hemoglobin A1C 7.3    GLUCOSE, POCT (MANUAL RESULT ENTRY)      Result Value Range   POC Glucose 157 (*) 70 - 99 mg/dl  KOH      Assessment & Plan:  Diflucan 150mg  for vaginitis

## 2013-01-02 ENCOUNTER — Other Ambulatory Visit: Payer: Self-pay | Admitting: Physician Assistant

## 2013-02-07 ENCOUNTER — Other Ambulatory Visit: Payer: Self-pay | Admitting: Physician Assistant

## 2013-02-20 ENCOUNTER — Ambulatory Visit (INDEPENDENT_AMBULATORY_CARE_PROVIDER_SITE_OTHER): Payer: Medicare Other | Admitting: Family Medicine

## 2013-02-20 VITALS — BP 148/88 | HR 60 | Temp 98.3°F | Resp 18 | Ht 63.0 in | Wt 196.2 lb

## 2013-02-20 DIAGNOSIS — Z23 Encounter for immunization: Secondary | ICD-10-CM

## 2013-02-20 DIAGNOSIS — E119 Type 2 diabetes mellitus without complications: Secondary | ICD-10-CM

## 2013-02-20 DIAGNOSIS — E663 Overweight: Secondary | ICD-10-CM

## 2013-02-20 DIAGNOSIS — R2242 Localized swelling, mass and lump, left lower limb: Secondary | ICD-10-CM

## 2013-02-20 DIAGNOSIS — D72819 Decreased white blood cell count, unspecified: Secondary | ICD-10-CM

## 2013-02-20 DIAGNOSIS — Z0000 Encounter for general adult medical examination without abnormal findings: Secondary | ICD-10-CM

## 2013-02-20 LAB — POCT CBC
Granulocyte percent: 36.7 %G — AB (ref 37–80)
Hemoglobin: 13.7 g/dL (ref 12.2–16.2)
MID (cbc): 0.3 (ref 0–0.9)
MPV: 10.7 fL (ref 0–99.8)
POC Granulocyte: 1.8 — AB (ref 2–6.9)
POC MID %: 6.9 %M (ref 0–12)
Platelet Count, POC: 162 10*3/uL (ref 142–424)
RBC: 4.91 M/uL (ref 4.04–5.48)

## 2013-02-20 LAB — COMPREHENSIVE METABOLIC PANEL
ALT: 14 U/L (ref 0–35)
Albumin: 4.5 g/dL (ref 3.5–5.2)
Alkaline Phosphatase: 84 U/L (ref 39–117)
CO2: 30 mEq/L (ref 19–32)
Potassium: 3.9 mEq/L (ref 3.5–5.3)
Sodium: 138 mEq/L (ref 135–145)
Total Bilirubin: 0.4 mg/dL (ref 0.3–1.2)
Total Protein: 7.6 g/dL (ref 6.0–8.3)

## 2013-02-20 LAB — LDL CHOLESTEROL, DIRECT: Direct LDL: 119 mg/dL — ABNORMAL HIGH

## 2013-02-20 LAB — POCT GLYCOSYLATED HEMOGLOBIN (HGB A1C): Hemoglobin A1C: 7

## 2013-02-20 MED ORDER — SIMVASTATIN 20 MG PO TABS: ORAL_TABLET | ORAL | Status: DC

## 2013-02-20 MED ORDER — GLIPIZIDE 5 MG PO TABS: ORAL_TABLET | ORAL | Status: DC

## 2013-02-20 NOTE — Progress Notes (Signed)
[facility] [address], [place] [address] [phone]  Date:  02/20/2013   Name:  Catherine Joseph   DOB:  07-Oct-1940   MRN:  [id number]  PCP:  Tally Due, MD    Chief Complaint: Annual Exam   History of Present Illness:  Catherine Joseph is a 72 y.o. very pleasant female patient who presents with the following:  Last seen in April of this year for DM check-up.  Pt of Dr. Perrin Maltese.   Her FBG was 132 this morning.   She stopped taking her metfomin last year.  She is taking glucotrol and 20 mg of simvastatin at this time.   She has had a hysterectomy.   Last mammogram was within the last couple of years.   She had had a colonoscopy- she thinks in 2010.  This looked ok.    She just ate while she was here at clinic  She is also concerned about a firm bump on the top of her left foot that she has noted for a few years.  Not painful  Patient Active Problem List   Diagnosis Date Noted  . 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 Lung nodule 02/01/2012  . Mixed hyperlipidemia 08/03/2011  . Bradycardia 08/03/2011  . Dizzy spells 08/03/2011  . Hypertension 07/22/2011  . Diabetes mellitus type II 07/22/2011    Past Medical History  Diagnosis Date  . Hypertension   . Diabetes mellitus type II   . Syncope   . Bradycardia   . Weakness   . Cataract   . Osteoporosis     Past Surgical History  Procedure Laterality Date  . Eye surgery    . Cesarean section    . Abdominal hysterectomy      History  Substance Use Topics  . Smoking status: Never Smoker   . Smokeless tobacco: Not on file  . Alcohol Use: No    Family History  Problem Relation Age of Onset  . Diabetes Sister     Allergies  Allergen Reactions  . Codeine Other (See Comments)    vertigo    Medication list has been reviewed and updated.  Current Outpatient Prescriptions on File Prior to Visit  Medication Sig Dispense Refill  . fluconazole (DIFLUCAN) 150 MG tablet  Take 1 tablet (150 mg total) by mouth once.  3 tablet  1  . glipiZIDE (GLUCOTROL) 5 MG tablet take 1 tablet by mouth every morning before BREAKFAST  90 tablet  0  . simvastatin (ZOCOR) 40 MG tablet take 1 tablet by mouth every evening  90 tablet  1  . valsartan-hydrochlorothiazide (DIOVAN-HCT) 160-25 MG per tablet take 1 tablet by mouth once daily  90 tablet  1  . fluticasone (FLONASE) 50 MCG/ACT nasal spray Place 2 sprays into the nose daily.  16 g  6  . metFORMIN (GLUCOPHAGE-XR) 500 MG 24 hr tablet Take 1 tablet (500 mg total) by mouth daily with breakfast.  30 tablet  11  . terbinafine (LAMISIL) 250 MG tablet Take 1 tablet (250 mg total) by mouth daily.  30 tablet  0   No current facility-administered medications on file prior to visit.    Review of Systems:  As per HPI- otherwise negative.   Physical Examination: Filed Vitals:   02/20/13 1602  BP: 148/88  Pulse: 60  Temp: 98.3 F (36.8 C)  Resp: 18   Filed Vitals:   02/20/13 1602  Height: 5\' 3"  (1.6 m)  Weight:  196 lb 3.2 oz (88.996 kg)   Body mass index is 34.76 kg/(m^2). Ideal Body Weight: Weight in (lb) to have BMI = 25: 140.8  GEN: WDWN, NAD, Non-toxic, A & O x 3, looks well HEENT: Atraumatic, Normocephalic. Neck supple. No masses, No LAD. Ears and Nose: No external deformity. CV: RRR, No M/G/R. No JVD. No thrill. No extra heart sounds. PULM: CTA B, no wheezes, crackles, rhonchi. No retractions. No resp. distress. No accessory muscle use. ABD: S, NT, ND, +BS. No rebound. No HSM. EXTR: No c/c/e NEURO Normal gait.  PSYCH: Normally interactive. Conversant. Not depressed or anxious appearing.  Calm demeanor.  Foot exam: normal filament perception bilaterally.  On the dorsum of the left foot there are two small, adjacent mobile masses over the extensor tendons.  Suspect cysts.    Results for orders placed in visit on 02/20/13  POCT GLYCOSYLATED HEMOGLOBIN (HGB A1C)      Result Value Range   Hemoglobin A1C 7.0     POCT CBC      Result Value Range   WBC 4.9  4.6 - 10.2 K/uL   Lymph, poc 2.8  0.6 - 3.4   POC LYMPH PERCENT 56.4 (*) 10 - 50 %L   MID (cbc) 0.3  0 - 0.9   POC MID % 6.9  0 - 12 %M   POC Granulocyte 1.8 (*) 2 - 6.9   Granulocyte percent 36.7 (*) 37 - 80 %G   RBC 4.91  4.04 - 5.48 M/uL   Hemoglobin 13.7  12.2 - 16.2 g/dL   HCT, POC 40.9  81.1 - 47.9 %   MCV 88.4  80 - 97 fL   MCH, POC 27.9  27 - 31.2 pg   MCHC 31.6 (*) 31.8 - 35.4 g/dL   RDW, POC 91.4     Platelet Count, POC 162  142 - 424 K/uL   MPV 10.7  0 - 99.8 fL    Assessment and Plan: Physical exam - Plan: Comprehensive metabolic panel, Flu Vaccine QUAD 36+ mos IM  Type II or unspecified type diabetes mellitus without mention of complication, not stated as uncontrolled - Plan: POCT glycosylated hemoglobin (Hb A1C), Microalbumin, urine, LDL cholesterol, direct, glipiZIDE (GLUCOTROL) 5 MG tablet, simvastatin (ZOCOR) 20 MG tablet  Leukopenia - Plan: POCT CBC  Overweight  Mass of foot, left - Plan: [place] Misc Soft Tissue  Updated her annual flu shot today.  DM control is ok.   Formerly noted leukopenia is resolved Await the rest of her labs- Will plan further follow- up pending results. Ultrasound to evaluate like cysts on her foot.    Signed Abbe Amsterdam, MD

## 2013-02-20 NOTE — Patient Instructions (Addendum)
We will be in touch with the rest of your labs.   You have sensitive skin- it may be that most soaps and other products are too harsh for you.   You might be able to wash with a sensitive skin cleanser such as oil of olay.   Also, [employer] carries the [employer]s product line- they have a good sensitive skin moisturizer that you might try

## 2013-02-21 ENCOUNTER — Other Ambulatory Visit: Payer: Self-pay | Admitting: Radiology

## 2013-02-21 ENCOUNTER — Encounter: Payer: Self-pay | Admitting: Family Medicine

## 2013-02-21 DIAGNOSIS — R2242 Localized swelling, mass and lump, left lower limb: Secondary | ICD-10-CM

## 2013-02-21 NOTE — Addendum Note (Signed)
Addended by: Abbe Amsterdam C on: 02/21/2013 06:38 AM   Modules accepted: Orders

## 2013-02-26 ENCOUNTER — Ambulatory Visit: Payer: Medicare Other | Admitting: Internal Medicine

## 2013-02-26 ENCOUNTER — Ambulatory Visit
Admission: RE | Admit: 2013-02-26 | Discharge: 2013-02-26 | Disposition: A | Discharge status: Home / Self Care | Payer: Medicare Other | Source: Ambulatory Visit | Attending: Family Medicine | Admitting: Family Medicine

## 2013-02-26 ENCOUNTER — Ambulatory Visit
Admission: RE | Admit: 2013-02-26 | Discharge: 2013-02-26 | Disposition: A | Discharge status: Home / Self Care | Payer: Medicare Other | Source: Ambulatory Visit | Attending: Emergency Medicine | Admitting: Emergency Medicine

## 2013-02-26 DIAGNOSIS — R2242 Localized swelling, mass and lump, left lower limb: Secondary | ICD-10-CM

## 2013-02-26 DIAGNOSIS — R9389 Abnormal findings on diagnostic imaging of other specified body structures: Secondary | ICD-10-CM

## 2013-02-27 ENCOUNTER — Encounter: Payer: Self-pay | Admitting: Family Medicine

## 2013-05-09 ENCOUNTER — Other Ambulatory Visit: Payer: Self-pay | Admitting: Internal Medicine

## 2013-05-14 ENCOUNTER — Other Ambulatory Visit: Payer: Self-pay | Admitting: Internal Medicine

## 2013-05-14 ENCOUNTER — Telehealth: Payer: Self-pay

## 2013-05-14 DIAGNOSIS — N898 Other specified noninflammatory disorders of vagina: Secondary | ICD-10-CM

## 2013-05-14 DIAGNOSIS — E119 Type 2 diabetes mellitus without complications: Secondary | ICD-10-CM

## 2013-05-14 NOTE — Telephone Encounter (Signed)
Called, what medication? No answer.

## 2013-05-14 NOTE — Telephone Encounter (Signed)
Patient would like [place] to call her about refilling one of her medications she said her pharmacy asked her to call us

## 2013-05-15 MED ORDER — FLUCONAZOLE 150 MG PO TABS: 150.0000 mg | ORAL_TABLET | Freq: Once | ORAL | Status: DC

## 2013-05-15 NOTE — Telephone Encounter (Signed)
Tried to call - no answer, no VM.

## 2013-05-15 NOTE — Telephone Encounter (Signed)
Please get details on why the patient needs this.  We typically do not refill this medication without a visit.

## 2013-05-15 NOTE — Telephone Encounter (Signed)
Sent.  Pt will need OV for more.  Last seen for this in April 2014

## 2013-05-15 NOTE — Telephone Encounter (Signed)
Called again. She needs diflucan, would like refill, pended please advise.

## 2013-05-16 NOTE — Telephone Encounter (Signed)
Sent in yesterday.

## 2013-05-16 NOTE — Telephone Encounter (Signed)
Called her again, no answer

## 2013-05-18 NOTE — Telephone Encounter (Signed)
Unable to reach.

## 2013-05-22 ENCOUNTER — Emergency Department (HOSPITAL_COMMUNITY): Payer: Medicare Other

## 2013-05-22 ENCOUNTER — Encounter (HOSPITAL_COMMUNITY): Payer: Self-pay | Admitting: Emergency Medicine

## 2013-05-22 ENCOUNTER — Emergency Department (HOSPITAL_COMMUNITY)
Admission: EM | Admit: 2013-05-22 | Discharge: 2013-05-22 | Disposition: A | Discharge status: Home / Self Care | Payer: Medicare Other | Attending: Emergency Medicine | Admitting: Emergency Medicine

## 2013-05-22 DIAGNOSIS — IMO0002 Reserved for concepts with insufficient information to code with codable children: Secondary | ICD-10-CM | POA: Insufficient documentation

## 2013-05-22 DIAGNOSIS — I10 Essential (primary) hypertension: Secondary | ICD-10-CM | POA: Insufficient documentation

## 2013-05-22 DIAGNOSIS — Z90710 Acquired absence of both cervix and uterus: Secondary | ICD-10-CM | POA: Insufficient documentation

## 2013-05-22 DIAGNOSIS — R109 Unspecified abdominal pain: Secondary | ICD-10-CM

## 2013-05-22 DIAGNOSIS — R1012 Left upper quadrant pain: Secondary | ICD-10-CM | POA: Insufficient documentation

## 2013-05-22 DIAGNOSIS — E119 Type 2 diabetes mellitus without complications: Secondary | ICD-10-CM | POA: Insufficient documentation

## 2013-05-22 DIAGNOSIS — R112 Nausea with vomiting, unspecified: Secondary | ICD-10-CM | POA: Insufficient documentation

## 2013-05-22 DIAGNOSIS — Z79899 Other long term (current) drug therapy: Secondary | ICD-10-CM | POA: Insufficient documentation

## 2013-05-22 DIAGNOSIS — Z8739 Personal history of other diseases of the musculoskeletal system and connective tissue: Secondary | ICD-10-CM | POA: Insufficient documentation

## 2013-05-22 DIAGNOSIS — Z8669 Personal history of other diseases of the nervous system and sense organs: Secondary | ICD-10-CM | POA: Insufficient documentation

## 2013-05-22 LAB — URINALYSIS, ROUTINE W REFLEX MICROSCOPIC
Glucose, UA: NEGATIVE mg/dL
Specific Gravity, Urine: 1.016 (ref 1.005–1.030)
pH: 7.5 (ref 5.0–8.0)

## 2013-05-22 LAB — CBC WITH DIFFERENTIAL/PLATELET
Eosinophils Absolute: 0 10*3/uL (ref 0.0–0.7)
Eosinophils Relative: 1 % (ref 0–5)
HCT: 40.9 % (ref 36.0–46.0)
Hemoglobin: 13.9 g/dL (ref 12.0–15.0)
Lymphs Abs: 1.6 10*3/uL (ref 0.7–4.0)
MCH: 28.5 pg (ref 26.0–34.0)
MCV: 84 fL (ref 78.0–100.0)
Monocytes Absolute: 0.2 10*3/uL (ref 0.1–1.0)
Monocytes Relative: 4 % (ref 3–12)
RBC: 4.87 MIL/uL (ref 3.87–5.11)

## 2013-05-22 LAB — COMPREHENSIVE METABOLIC PANEL
Albumin: 3.8 g/dL (ref 3.5–5.2)
Alkaline Phosphatase: 84 U/L (ref 39–117)
BUN: 16 mg/dL (ref 6–23)
Calcium: 9.3 mg/dL (ref 8.4–10.5)
Creatinine, Ser: 0.83 mg/dL (ref 0.50–1.10)
GFR calc Af Amer: 79 mL/min — ABNORMAL LOW (ref >90)
GFR calc non Af Amer: 68 mL/min — ABNORMAL LOW (ref >90)
Glucose, Bld: 176 mg/dL — ABNORMAL HIGH (ref 70–99)
Potassium: 3.8 mEq/L (ref 3.5–5.1)
Total Protein: 7.7 g/dL (ref 6.0–8.3)

## 2013-05-22 LAB — URINE MICROSCOPIC-ADD ON

## 2013-05-22 MED ORDER — FENTANYL CITRATE 0.05 MG/ML IJ SOLN
50.0000 ug | Freq: Once | INTRAMUSCULAR | Status: AC
  Administered 2013-05-22: 50 ug via INTRAVENOUS
  Filled 2013-05-22: qty 2

## 2013-05-22 MED ORDER — SODIUM CHLORIDE 0.9 % IV BOLUS (SEPSIS)
500.0000 mL | Freq: Once | INTRAVENOUS | Status: AC
  Administered 2013-05-22: 500 mL via INTRAVENOUS

## 2013-05-22 MED ORDER — IOHEXOL 300 MG/ML  SOLN
25.0000 mL | INTRAMUSCULAR | Status: DC | PRN
  Administered 2013-05-22: 25 mL via ORAL

## 2013-05-22 MED ORDER — IOHEXOL 300 MG/ML  SOLN
100.0000 mL | Freq: Once | INTRAMUSCULAR | Status: AC | PRN
  Administered 2013-05-22: 100 mL via INTRAVENOUS

## 2013-05-22 MED ORDER — ONDANSETRON HCL 4 MG/2ML IJ SOLN
4.0000 mg | Freq: Once | INTRAMUSCULAR | Status: AC
  Administered 2013-05-22: 4 mg via INTRAVENOUS
  Filled 2013-05-22: qty 2

## 2013-05-22 NOTE — ED Notes (Signed)
Transported to CT. Scan on stretcher

## 2013-05-22 NOTE — ED Notes (Signed)
MD Zavitz at bedside  

## 2013-05-22 NOTE — ED Provider Notes (Signed)
CSN: [id number]     Arrival date & time 05/22/13  0751 History   First MD Initiated Contact with Patient 05/22/13 [phone]     Chief Complaint  Patient presents with  . Abdominal Pain   (Consider location/radiation/quality/duration/timing/severity/associated sxs/prior Treatment) HPI Comments: 72 yo female with dm, htn, chol presents with left upper/ epig pain since this am with two non bloody vomiting episodes.  Fairly constant, mild back radiation, similar event years ago unknown diagnosis. No abdo surgery hx.  No cp or sob or diaphoresis.  Improved since.  No urinary sxs.    Patient is a 72 y.o. female presenting with abdominal pain. The history is provided by the patient.  Abdominal Pain Associated symptoms: nausea and vomiting   Associated symptoms: no chest pain, no chills, no dysuria, no fever and no shortness of breath     Past Medical History  Diagnosis Date  . Hypertension   . Diabetes mellitus type II   . Syncope   . Bradycardia   . Weakness   . Cataract   . Osteoporosis    Past Surgical History  Procedure Laterality Date  . Eye surgery    . Cesarean section    . Abdominal hysterectomy     Family History  Problem Relation Age of Onset  . Diabetes Sister    History  Substance Use Topics  . Smoking status: Never Smoker   . Smokeless tobacco: Not on file  . Alcohol Use: No   OB History   Grav Para Term Preterm Abortions TAB SAB Ect Mult Living                 Review of Systems  Constitutional: Positive for appetite change. Negative for fever and chills.  HENT: Negative for congestion.   Eyes: Negative for visual disturbance.  Respiratory: Negative for shortness of breath.   Cardiovascular: Negative for chest pain.  Gastrointestinal: Positive for nausea, vomiting and abdominal pain. Negative for blood in stool.  Genitourinary: Negative for dysuria and flank pain.  Musculoskeletal: Negative for back pain, neck pain and neck stiffness.  Skin: Negative for rash.   Neurological: Negative for light-headedness and headaches.    Allergies  Codeine  Home Medications   Current Outpatient Rx  Name  Route  Sig  Dispense  Refill  . glipiZIDE (GLUCOTROL) 5 MG tablet      take 1 tablet by mouth every morning before BREAKFAST   90 tablet   3   . simvastatin (ZOCOR) 20 MG tablet      take 1 tablet by mouth every evening   90 tablet   3   . valsartan-hydrochlorothiazide (DIOVAN-HCT) 160-25 MG per tablet      take 1 tablet by mouth once daily   90 tablet   1   . fluconazole (DIFLUCAN) 150 MG tablet   Oral   Take 1 tablet (150 mg total) by mouth once.   2 tablet   0   . EXPIRED: fluticasone (FLONASE) 50 MCG/ACT nasal spray   Nasal   Place 2 sprays into the nose daily.   16 g   6    BP 154/83  Pulse 67  Temp(Src) 97.5 F (36.4 C) (Oral)  SpO2 100% Physical Exam  Nursing note and vitals reviewed. Constitutional: She is oriented to person, place, and time. She appears well-developed and well-nourished.  HENT:  Head: Normocephalic and atraumatic.  Eyes: Conjunctivae are normal. Right eye exhibits no discharge. Left eye exhibits no discharge.  Neck:  Normal range of motion. Neck supple. No tracheal deviation present.  Cardiovascular: Normal rate and regular rhythm.   Pulmonary/Chest: Effort normal and breath sounds normal.  Abdominal: Soft. She exhibits no distension. There is tenderness (mild epig and left upper). There is no guarding.  Musculoskeletal: She exhibits no edema.  Neurological: She is alert and oriented to person, place, and time.  Skin: Skin is warm. No rash noted.  Psychiatric: She has a normal mood and affect.    ED Course  Procedures (including critical care time) Labs Review Labs Reviewed  GLUCOSE, CAPILLARY - Abnormal; Notable for the following:    Glucose-Capillary 167 (*)    All other components within normal limits  COMPREHENSIVE METABOLIC PANEL - Abnormal; Notable for the following:    Glucose, Bld 176  (*)    GFR calc non Af Amer 68 (*)    GFR calc Af Amer 79 (*)    All other components within normal limits  URINALYSIS, ROUTINE W REFLEX MICROSCOPIC - Abnormal; Notable for the following:    Hgb urine dipstick TRACE (*)    Leukocytes, UA SMALL (*)    All other components within normal limits  CBC WITH DIFFERENTIAL  LIPASE, BLOOD  TROPONIN I  URINE MICROSCOPIC-ADD ON   Imaging Review Ct Abdomen Pelvis W Contrast  05/22/2013   CLINICAL DATA:  Left upper abdominal pain.  EXAM: CT ABDOMEN AND PELVIS WITH CONTRAST  TECHNIQUE: Multidetector CT imaging of the abdomen and pelvis was performed using the standard protocol following bolus administration of intravenous contrast.  CONTRAST:  OMNIPAQUE IOHEXOL 300 MG/ML  SOLN  COMPARISON:  None.  FINDINGS: The lung bases are clear.  The liver demonstrates no focal abnormality. There is no intrahepatic or extrahepatic biliary ductal dilatation. The gallbladder is normal. The spleen demonstrates no focal abnormality. The kidneys, adrenal glands and pancreas are normal. The bladder is unremarkable.  The stomach, duodenum, small intestine, and large intestine demonstrate no contrast extravasation or dilatation. There is a small fat containing umbilical hernia. There is no pneumoperitoneum, pneumatosis, or portal venous gas. There is no abdominal or pelvic free fluid. There is no lymphadenopathy.  The abdominal aorta is normal in caliber with atherosclerosis.  There are no lytic or sclerotic osseous lesions.  IMPRESSION: No acute abdominal or pelvic pathology   Electronically Signed   By: Elige Ko   On: 05/22/2013 10:35    EKG Interpretation    Date/Time:  Tuesday May 22 2013 09:13:14 EST Ventricular Rate:  57 PR Interval:  182 QRS Duration: 84 QT Interval:  458 QTC Calculation: 446 R Axis:   18 Text Interpretation:  Sinus rhythm No acute findings Confirmed by Tala Eber  MD, Rikita Grabert ([id number]) on 05/22/2013 9:18:29 AM            MDM   1.  Left sided abdominal pain    Concern for gastritis vs partial bowel obst vs less likely cardiac. With risk factors/ age and left upper ekg/ troponin screen. CT abd pelvis for further details with labs. Pain meds and zofran.  CT no acute findings.  Sxs improved.   Results and differential diagnosis were discussed with the patient. Close follow up outpatient was discussed, patient comfortable with the plan.   Diagnosis: Abd pain    Enid Skeens, MD 05/24/13 0000

## 2013-05-22 NOTE — ED Notes (Signed)
Ist. Cup of contrast given to drink.

## 2013-05-22 NOTE — ED Notes (Signed)
Patient c/o abd. Pain onset this am with 2 episodes of vomiting. States she had a normal bowel movement this am. No change in pain. Rates pain 8/10

## 2013-05-25 ENCOUNTER — Ambulatory Visit (INDEPENDENT_AMBULATORY_CARE_PROVIDER_SITE_OTHER): Payer: Medicare Other | Admitting: Family Medicine

## 2013-05-25 VITALS — BP 122/74 | HR 56 | Temp 98.4°F | Resp 16 | Ht 63.0 in | Wt 198.8 lb

## 2013-05-25 DIAGNOSIS — Z1239 Encounter for other screening for malignant neoplasm of breast: Secondary | ICD-10-CM

## 2013-05-25 DIAGNOSIS — N951 Menopausal and female climacteric states: Secondary | ICD-10-CM

## 2013-05-25 DIAGNOSIS — E119 Type 2 diabetes mellitus without complications: Secondary | ICD-10-CM

## 2013-05-25 LAB — POCT GLYCOSYLATED HEMOGLOBIN (HGB A1C): Hemoglobin A1C: 6.9

## 2013-05-25 MED ORDER — GLIPIZIDE 5 MG PO TABS: ORAL_TABLET | ORAL | Status: DC

## 2013-05-25 MED ORDER — GLUCOSE BLOOD VI STRP: ORAL_STRIP | Status: DC

## 2013-05-25 NOTE — Patient Instructions (Addendum)
[phone]) [id number]. [facility].  [address] [place], [place] [address]  The breast center can take care of both your mammogram and your bone density scan.   My computer is not allowing me to make the bone density scan referral because medicare does not accept my code.  I suspect the breast center staff knows how to deal with this.  I will be in touch with your labs when they come in  Your A1c looks great.

## 2013-05-25 NOTE — Progress Notes (Signed)
[facility] [address], [place] [address] [phone]  Date:  05/25/2013   Name:  Catherine Joseph   DOB:  Nov 17, 1940   MRN:  [id number]  PCP:  Tally Due, MD    Chief Complaint: Hospital Follow Up, Medication Refill and Diabetes Check   History of Present Illness:  Catherine Joseph is a 72 y.o. very pleasant female patient who presents with the following:  Here today for ED follow-up.  She was at the ED on 12/2 with abdominal pain.  She had a CT which was negative.  She also had negative troponin.  She now feels better, her pain is resolved.  She is eating- not a lot but some.   She did have vomiting prior to going to the ER- this is now resovled.  No diarrhea.   She has not noted a fever.  Generally she is feeling much better.  Here today more for follow-up of her DM.   Per he insurance company she needs to have an A1c test.   She checks her glucose daily but is not on insulin  Her last mammogram was in 2010.  She would like to have this done and get a dexa scan.    Patient Active Problem List   Diagnosis Date Noted  . 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 Lung nodule 02/01/2012  . Mixed hyperlipidemia 08/03/2011  . Bradycardia 08/03/2011  . Dizzy spells 08/03/2011  . Hypertension 07/22/2011  . Diabetes mellitus type II 07/22/2011    Past Medical History  Diagnosis Date  . Hypertension   . Diabetes mellitus type II   . Syncope   . Bradycardia   . Weakness   . Cataract   . Osteoporosis     Past Surgical History  Procedure Laterality Date  . Eye surgery    . Cesarean section    . Abdominal hysterectomy      History  Substance Use Topics  . Smoking status: Never Smoker   . Smokeless tobacco: Not on file  . Alcohol Use: No    Family History  Problem Relation Age of Onset  . Diabetes Sister     Allergies  Allergen Reactions  . Codeine Other (See Comments)    vertigo    Medication list has been reviewed and  updated.  Current Outpatient Prescriptions on File Prior to Visit  Medication Sig Dispense Refill  . glipiZIDE (GLUCOTROL) 5 MG tablet take 1 tablet by mouth every morning before BREAKFAST  90 tablet  3  . simvastatin (ZOCOR) 20 MG tablet take 1 tablet by mouth every evening  90 tablet  3  . valsartan-hydrochlorothiazide (DIOVAN-HCT) 160-25 MG per tablet take 1 tablet by mouth once daily  90 tablet  1  . fluconazole (DIFLUCAN) 150 MG tablet Take 1 tablet (150 mg total) by mouth once.  2 tablet  0  . fluticasone (FLONASE) 50 MCG/ACT nasal spray Place 2 sprays into the nose daily.  16 g  6   No current facility-administered medications on file prior to visit.    Review of Systems:  As per HPI- otherwise negative.   Physical Examination: Filed Vitals:   05/25/13 1626  BP: 122/74  Pulse: 56  Temp: 98.4 F (36.9 C)  Resp: 16   Filed Vitals:   05/25/13 1626  Height: 5\' 3"  (1.6 m)  Weight: 198 lb 12.8 oz (90.175 kg)   Body mass index is 35.22 kg/(m^2). Ideal Body Weight: Weight in (  lb) to have BMI = 25: 140.8  GEN: WDWN, NAD, Non-toxic, A & O x 3, obese, looks well HEENT: Atraumatic, Normocephalic. Neck supple. No masses, No LAD. Ears and Nose: No external deformity. CV: RRR, No M/G/R. No JVD. No thrill. No extra heart sounds. PULM: CTA B, no wheezes, crackles, rhonchi. No retractions. No resp. distress. No accessory muscle use. ABD: S, NT, ND, +BS. No rebound. No HSM.  benign EXTR: No c/c/e NEURO Normal gait.  PSYCH: Normally interactive. Conversant. Not depressed or anxious appearing.  Calm demeanor.  Foot exam performed today.  She has normal monofilament sensation and normal NV status both feet.  No ulcers  Results for orders placed in visit on 05/25/13  POCT GLYCOSYLATED HEMOGLOBIN (HGB A1C)      Result Value Range   Hemoglobin A1C 6.9      Assessment and Plan: Type II or unspecified type diabetes mellitus without mention of complication, not stated as uncontrolled  - Plan: glucose blood ([facility] N GLUCOSE TEST) test strip, glipiZIDE (GLUCOTROL) 5 MG tablet, POCT glycosylated hemoglobin (Hb A1C), Lipid panel, HM DIABETES FOOT EXAM  Screening for breast cancer  Menopausal state  DM is controlled.  Refilled her medication.  She can stop such frequent glucose testing.  She is relieved about this news.   Will plan further follow- up pending labs. See patient instructions for more details.     Signed Abbe Amsterdam, MD

## 2013-05-26 ENCOUNTER — Encounter: Payer: Self-pay | Admitting: Family Medicine

## 2013-05-26 LAB — LIPID PANEL
Cholesterol: 163 mg/dL (ref 0–200)
HDL: 52 mg/dL (ref >39)
VLDL: 33 mg/dL (ref 0–40)

## 2013-05-30 ENCOUNTER — Telehealth: Payer: Self-pay | Admitting: Radiology

## 2013-05-30 DIAGNOSIS — M8580 Other specified disorders of bone density and structure, unspecified site: Secondary | ICD-10-CM

## 2013-05-30 DIAGNOSIS — E2839 Other primary ovarian failure: Secondary | ICD-10-CM

## 2013-05-30 DIAGNOSIS — Z1239 Encounter for other screening for malignant neoplasm of breast: Secondary | ICD-10-CM

## 2013-05-30 NOTE — Telephone Encounter (Signed)
Estrogen deficiency diagnosis will cover bone density study. Where were previous mammograms done?

## 2013-06-05 ENCOUNTER — Telehealth: Payer: Self-pay | Admitting: Radiology

## 2013-06-05 NOTE — Telephone Encounter (Signed)
Phone call from pharmacy about the test strips. Catherine Joseph has taken the call

## 2013-06-11 NOTE — Telephone Encounter (Signed)
Per Amy we can use the code estrogen deficiency to cover her bone density.  Called her- she would like to go ahead and set this up

## 2013-08-06 ENCOUNTER — Other Ambulatory Visit: Payer: Self-pay | Admitting: Internal Medicine

## 2013-08-07 NOTE — Telephone Encounter (Signed)
Dr Lorelei Pont you saw pt in 02/2013 and then 05/2013, but HTN not specifically addressed. Can we RF? Pended.

## 2013-08-15 ENCOUNTER — Ambulatory Visit (INDEPENDENT_AMBULATORY_CARE_PROVIDER_SITE_OTHER): Payer: Medicare HMO | Admitting: Family Medicine

## 2013-08-15 VITALS — BP 146/80 | HR 67 | Temp 99.0°F | Resp 16 | Ht 63.0 in | Wt 197.6 lb

## 2013-08-15 DIAGNOSIS — E119 Type 2 diabetes mellitus without complications: Secondary | ICD-10-CM

## 2013-08-15 DIAGNOSIS — M810 Age-related osteoporosis without current pathological fracture: Secondary | ICD-10-CM

## 2013-08-15 DIAGNOSIS — Z1239 Encounter for other screening for malignant neoplasm of breast: Secondary | ICD-10-CM

## 2013-08-15 DIAGNOSIS — L293 Anogenital pruritus, unspecified: Secondary | ICD-10-CM

## 2013-08-15 DIAGNOSIS — N898 Other specified noninflammatory disorders of vagina: Secondary | ICD-10-CM

## 2013-08-15 DIAGNOSIS — I10 Essential (primary) hypertension: Secondary | ICD-10-CM

## 2013-08-15 LAB — COMPREHENSIVE METABOLIC PANEL
ALK PHOS: 68 U/L (ref 39–117)
ALT: 14 U/L (ref 0–35)
AST: 18 U/L (ref 0–37)
Albumin: 4.2 g/dL (ref 3.5–5.2)
BUN: 19 mg/dL (ref 6–23)
CO2: 30 mEq/L (ref 19–32)
Calcium: 9.2 mg/dL (ref 8.4–10.5)
Chloride: 103 mEq/L (ref 96–112)
Creat: 0.9 mg/dL (ref 0.50–1.10)
Glucose, Bld: 72 mg/dL (ref 70–99)
Potassium: 4 mEq/L (ref 3.5–5.3)
SODIUM: 139 meq/L (ref 135–145)
TOTAL PROTEIN: 7.2 g/dL (ref 6.0–8.3)
Total Bilirubin: 0.4 mg/dL (ref 0.2–1.2)

## 2013-08-15 LAB — CBC
HCT: 40.3 % (ref 36.0–46.0)
Hemoglobin: 13.4 g/dL (ref 12.0–15.0)
MCH: 27.3 pg (ref 26.0–34.0)
MCHC: 33.3 g/dL (ref 30.0–36.0)
MCV: 82.2 fL (ref 78.0–100.0)
Platelets: 184 10*3/uL (ref 150–400)
RBC: 4.9 MIL/uL (ref 3.87–5.11)
RDW: 15 % (ref 11.5–15.5)
WBC: 4 10*3/uL (ref 4.0–10.5)

## 2013-08-15 LAB — POCT GLYCOSYLATED HEMOGLOBIN (HGB A1C): HEMOGLOBIN A1C: 7.2

## 2013-08-15 MED ORDER — VALSARTAN-HYDROCHLOROTHIAZIDE 160-25 MG PO TABS: ORAL_TABLET | ORAL | Status: DC

## 2013-08-15 MED ORDER — FLUCONAZOLE 150 MG PO TABS: 150.0000 mg | ORAL_TABLET | Freq: Once | ORAL | Status: DC

## 2013-08-15 MED ORDER — SIMVASTATIN 20 MG PO TABS: ORAL_TABLET | ORAL | Status: DC

## 2013-08-15 MED ORDER — GLIPIZIDE 5 MG PO TABS: ORAL_TABLET | ORAL | Status: DC

## 2013-08-15 NOTE — Patient Instructions (Signed)
Work on diet and exercising a bit more with a goal of losing about 10 lbs.    Please come and see me in about 3 months.

## 2013-08-15 NOTE — Progress Notes (Signed)
[facility] [address], [place] [address] [phone]  Date:  08/15/2013   Name:  Catherine Joseph   DOB:  December 13, 1940   MRN:  [id number]  PCP:  Kennon Portela, MD    Chief Complaint: Follow-up and Medication Refill   History of Present Illness:  Catherine Joseph is a 73 y.o. very pleasant female patient who presents with the following:  She is here today for a recheck of her DM.  She notes some arthritis pains since the weather has been so cold- o/w she feels well.   She has been out of her HTN meds for 3 days today.   She is not sure of the date of her last tetanus shot but would prefer to have it the next time she is seen and not today.  She would like a referral for a dexa scan and mammogram as well  Patient Active Problem List   Diagnosis Date Noted  . 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 Lung nodule 02/01/2012  . Mixed hyperlipidemia 08/03/2011  . Bradycardia 08/03/2011  . Dizzy spells 08/03/2011  . Hypertension 07/22/2011  . Diabetes mellitus type II 07/22/2011    Past Medical History  Diagnosis Date  . Hypertension   . Diabetes mellitus type II   . Syncope   . Bradycardia   . Weakness   . Cataract   . Osteoporosis     Past Surgical History  Procedure Laterality Date  . Eye surgery    . Cesarean section    . Abdominal hysterectomy      History  Substance Use Topics  . Smoking status: Never Smoker   . Smokeless tobacco: Not on file  . Alcohol Use: No    Family History  Problem Relation Age of Onset  . Diabetes Sister     Allergies  Allergen Reactions  . Codeine Other (See Comments)    vertigo    Medication list has been reviewed and updated.  Current Outpatient Prescriptions on File Prior to Visit  Medication Sig Dispense Refill  . fluconazole (DIFLUCAN) 150 MG tablet Take 1 tablet (150 mg total) by mouth once.  2 tablet  0  . glipiZIDE (GLUCOTROL) 5 MG tablet take 1 tablet by mouth every morning  before BREAKFAST  90 tablet  3  . glucose blood (CARESENS N GLUCOSE TEST) test strip Use as instructed  100 each  12  . simvastatin (ZOCOR) 20 MG tablet take 1 tablet by mouth every evening  90 tablet  3  . valsartan-hydrochlorothiazide (DIOVAN-HCT) 160-25 MG per tablet take 1 tablet by mouth once daily  90 tablet  1  . fluticasone (FLONASE) 50 MCG/ACT nasal spray Place 2 sprays into the nose daily.  16 g  6  . glucose blood test strip 1 each by Other route as needed for other. Use as instructed. Care Sens N Brand Strips       No current facility-administered medications on file prior to visit.    Review of Systems:  As per HPI- otherwise negative.   Physical Examination: Filed Vitals:   08/15/13 1551  BP: 146/80  Pulse: 67  Temp: 99 F (37.2 C)  Resp: 16   Filed Vitals:   08/15/13 1551  Height: 5\' 3"  (1.6 m)  Weight: 197 lb 9.6 oz (89.631 kg)   Body mass index is 35.01 kg/(m^2). Ideal Body Weight: Weight in (lb) to have BMI = 25: 140.8  GEN: WDWN, NAD, Non-toxic, A &  O x 3, overweight, looks well HEENT: Atraumatic, Normocephalic. Neck supple. No masses, No LAD. Ears and Nose: No external deformity. CV: RRR, No M/G/R. No JVD. No thrill. No extra heart sounds. PULM: CTA B, no wheezes, crackles, rhonchi. No retractions. No resp. distress. No accessory muscle use. ABD: S, NT, ND, +BS. No rebound. No HSM. EXTR: No c/c/e NEURO Normal gait.  PSYCH: Normally interactive. Conversant. Not depressed or anxious appearing.  Calm demeanor.  Foot exam normal bilaterlly today  Results for orders placed in visit on 08/15/13  POCT GLYCOSYLATED HEMOGLOBIN (HGB A1C)      Result Value Ref Range   Hemoglobin A1C 7.2       Assessment and Plan: Diabetes - Plan: POCT glycosylated hemoglobin (Hb A1C), fluconazole (DIFLUCAN) 150 MG tablet  Vaginal itching - Plan: CBC, fluconazole (DIFLUCAN) 150 MG tablet  Type II or unspecified type diabetes mellitus without mention of complication, not  stated as uncontrolled - Plan: Comprehensive metabolic panel, glipiZIDE (GLUCOTROL) 5 MG tablet, simvastatin (ZOCOR) 20 MG tablet  HTN (hypertension) - Plan: valsartan-hydrochlorothiazide (DIOVAN-HCT) 160-25 MG per tablet  Screening for breast cancer - Plan: DG Bone Density  Post-menopausal osteoporosis - Plan: HM MAMMOGRAPHY   Needs a referral for mammogram and dexa. Will place these today Her A1c is up a litle bit.  She is interested in weight loss.  Discussed strategies for weight loss- 30 minutes of brisk walking daily, build up to an hour.  Try to eat about 10% less than usual.  Refilled meds- will need to check her GFT to make sure she can continue glipizide.    Signed Lamar Blinks, MD

## 2013-08-16 ENCOUNTER — Encounter: Payer: Self-pay | Admitting: Family Medicine

## 2013-08-17 ENCOUNTER — Other Ambulatory Visit: Payer: Self-pay | Admitting: Radiology

## 2013-08-17 DIAGNOSIS — E2839 Other primary ovarian failure: Secondary | ICD-10-CM

## 2013-08-20 ENCOUNTER — Telehealth: Payer: Self-pay | Admitting: *Deleted

## 2013-08-20 NOTE — Telephone Encounter (Signed)
Message copied by Doylene Canning on Mon Aug 20, 2013 11:56 AM ------      Message from: Sandre Kitty      Created: Fri Aug 17, 2013 11:03 AM      Regarding: DEXA ORDER      Contact: [phone]       Medicare has recently changed the approved diagnoses for DEXA exams.   Below is what is now considered to be eligible according to [employer]:            All qualified people with Medicare who are at risk for osteoporosis and meet one or more of these conditions:            #A woman whose doctor determines she's estrogen deficient and at risk for osteoporosis, based on her medical history and other findings.      #A person whose X-rays show possible osteoporosis, osteopenia, or vertebral fractures.      #A person taking prednisone or steroid-type drugs or is planning to begin this treatment.      #A person who has been diagnosed with primary hyperparathyroidism.      #A person who is being monitored to see if their osteoporosis therapy is working.            Please update the diagnosis in EPIC if your patient qualifies under one of the above diagnoses.     Thank you!       ------

## 2013-08-20 NOTE — Telephone Encounter (Signed)
Dx code: 51.39

## 2013-08-22 NOTE — Telephone Encounter (Signed)
Called her- I got a message in my inbox that her mammogram was "overdue."  She has not yet heard about this appt.  Advised her that it might be easiest to schedule this herself- she is ok with this and I gave her the phone number for the breast center of [facility]. She will call.

## 2013-08-24 ENCOUNTER — Other Ambulatory Visit: Payer: Self-pay | Admitting: Family Medicine

## 2013-08-24 DIAGNOSIS — Z1231 Encounter for screening mammogram for malignant neoplasm of breast: Secondary | ICD-10-CM

## 2013-09-07 ENCOUNTER — Ambulatory Visit (INDEPENDENT_AMBULATORY_CARE_PROVIDER_SITE_OTHER): Payer: Medicare HMO | Admitting: Family Medicine

## 2013-09-07 ENCOUNTER — Ambulatory Visit: Payer: Medicare HMO

## 2013-09-07 VITALS — BP 112/60 | HR 61 | Temp 98.0°F | Resp 20 | Ht 63.0 in | Wt 196.0 lb

## 2013-09-07 DIAGNOSIS — K5900 Constipation, unspecified: Secondary | ICD-10-CM

## 2013-09-07 DIAGNOSIS — R0781 Pleurodynia: Secondary | ICD-10-CM

## 2013-09-07 DIAGNOSIS — E119 Type 2 diabetes mellitus without complications: Secondary | ICD-10-CM

## 2013-09-07 DIAGNOSIS — R109 Unspecified abdominal pain: Secondary | ICD-10-CM

## 2013-09-07 DIAGNOSIS — R0789 Other chest pain: Secondary | ICD-10-CM

## 2013-09-07 DIAGNOSIS — R079 Chest pain, unspecified: Secondary | ICD-10-CM

## 2013-09-07 DIAGNOSIS — R10A1 Flank pain, right side: Secondary | ICD-10-CM

## 2013-09-07 LAB — POCT URINALYSIS DIPSTICK
Bilirubin, UA: NEGATIVE
GLUCOSE UA: 250
KETONES UA: NEGATIVE
LEUKOCYTES UA: NEGATIVE
NITRITE UA: NEGATIVE
PH UA: 6
Protein, UA: NEGATIVE
Spec Grav, UA: 1.02
Urobilinogen, UA: 0.2

## 2013-09-07 LAB — POCT UA - MICROSCOPIC ONLY
Bacteria, U Microscopic: NEGATIVE
Casts, Ur, LPF, POC: NEGATIVE
Crystals, Ur, HPF, POC: NEGATIVE
Epithelial cells, urine per micros: NEGATIVE
YEAST UA: NEGATIVE

## 2013-09-07 LAB — POCT CBC
Granulocyte percent: 46.9 %G (ref 37–80)
HCT, POC: 45.5 % (ref 37.7–47.9)
Hemoglobin: 14.4 g/dL (ref 12.2–16.2)
LYMPH, POC: 1.9 (ref 0.6–3.4)
MCH, POC: 27.7 pg (ref 27–31.2)
MCHC: 31.6 g/dL — AB (ref 31.8–35.4)
MCV: 87.7 fL (ref 80–97)
MID (cbc): 0.3 (ref 0–0.9)
MPV: 11.3 fL (ref 0–99.8)
PLATELET COUNT, POC: 194 10*3/uL (ref 142–424)
POC GRANULOCYTE: 1.9 — AB (ref 2–6.9)
POC LYMPH %: 46.7 % (ref 10–50)
POC MID %: 6.4 % (ref 0–12)
RBC: 5.19 M/uL (ref 4.04–5.48)
RDW, POC: 14.5 %
WBC: 4.1 10*3/uL — AB (ref 4.6–10.2)

## 2013-09-07 MED ORDER — DICLOFENAC SODIUM 75 MG PO TBEC: 75.0000 mg | DELAYED_RELEASE_TABLET | Freq: Two times a day (BID) | ORAL | Status: DC

## 2013-09-07 MED ORDER — TRAMADOL HCL 50 MG PO TABS: 50.0000 mg | ORAL_TABLET | Freq: Three times a day (TID) | ORAL | Status: DC | PRN

## 2013-09-07 NOTE — Progress Notes (Signed)
Subjective: 73 year old lady who has been hurting in her right flank from a month. She has not seen a doctor for it. It came on gradually and now is hurts most of the time though it comes and goes some. It hurts a lot if she raises up or walks. She says it hurts in the ribs. No nausea vomiting diarrhea or constipation. No urinary symptoms. No blood in the stool urine. She is diabetic and hypertensive. She is originally from [place] but speaks [place] well with an accident.  Objective: Pleasant lady in no major distress, lying on the exam table. Chest clear. Heart regular without murmurs. Abdomen soft, normal bowel sounds, no masses or tenderness. No CVA tenderness. No specific rib tenderness but she hurts around the right lateral flank area and it seems to be mostly deep to the lateral short ribs.  Assessment: Right flank pain, etiology unclear  Plan: KUB and right ribs, CBC, urinalysis  [facility] reading (PRIMARY) by  Dr. Linna Darner No rib fractures Nonspecific abdomen on KUB  Results for orders placed in visit on 09/07/13  POCT CBC      Result Value Ref Range   WBC 4.1 (*) 4.6 - 10.2 K/uL   Lymph, poc 1.9  0.6 - 3.4   POC LYMPH PERCENT 46.7  10 - 50 %L   MID (cbc) 0.3  0 - 0.9   POC MID % 6.4  0 - 12 %M   POC Granulocyte 1.9 (*) 2 - 6.9   Granulocyte percent 46.9  37 - 80 %G   RBC 5.19  4.04 - 5.48 M/uL   Hemoglobin 14.4  12.2 - 16.2 g/dL   HCT, POC 45.5  37.7 - 47.9 %   MCV 87.7  80 - 97 fL   MCH, POC 27.7  27 - 31.2 pg   MCHC 31.6 (*) 31.8 - 35.4 g/dL   RDW, POC 14.5     Platelet Count, POC 194  142 - 424 K/uL   MPV 11.3  0 - 99.8 fL  POCT UA - MICROSCOPIC ONLY      Result Value Ref Range   WBC, Ur, HPF, POC 0-1     RBC, urine, microscopic 0-3     Bacteria, U Microscopic NEG     Mucus, UA TRACE     Epithelial cells, urine per micros NEG     Crystals, Ur, HPF, POC NEG     Casts, Ur, LPF, POC NEG     Yeast, UA NEG    POCT URINALYSIS DIPSTICK      Result Value Ref Range   Color,  UA YELLOW     Clarity, UA CLEAR     Glucose, UA 250     Bilirubin, UA NEG     Ketones, UA NEG     Spec Grav, UA 1.020     Blood, UA TRACE-INTACT     pH, UA 6.0     Protein, UA NEG     Urobilinogen, UA 0.2     Nitrite, UA NEG     Leukocytes, UA Negative     .  Treat with anti-inflammatory pain relief and pain relievers for when necessary use. If symptoms don't improve may have to do a CT scan of the abdomen and low ribs.

## 2013-09-07 NOTE — Patient Instructions (Signed)
Take diclofenac one twice daily with breakfast and supper for pain and inflammation of right chest wall and flank. If symptoms improve, may discontinue  For bad pain take tramadol 1 every 8 hours if needed  Return if not improving

## 2013-09-11 ENCOUNTER — Ambulatory Visit: Payer: No Typology Code available for payment source

## 2013-09-11 ENCOUNTER — Ambulatory Visit: Admission: RE | Admit: 2013-09-11 | Payer: Medicare HMO | Source: Ambulatory Visit

## 2013-09-11 ENCOUNTER — Other Ambulatory Visit: Payer: Self-pay

## 2013-09-11 ENCOUNTER — Ambulatory Visit
Admission: RE | Admit: 2013-09-11 | Discharge: 2013-09-11 | Disposition: A | Discharge status: Home / Self Care | Payer: Medicare Other | Source: Ambulatory Visit | Attending: Family Medicine | Admitting: Family Medicine

## 2013-09-11 ENCOUNTER — Ambulatory Visit
Admission: RE | Admit: 2013-09-11 | Discharge: 2013-09-11 | Disposition: A | Discharge status: Home / Self Care | Payer: Medicare HMO | Source: Ambulatory Visit

## 2013-09-11 ENCOUNTER — Ambulatory Visit
Admission: RE | Admit: 2013-09-11 | Discharge: 2013-09-11 | Disposition: A | Discharge status: Home / Self Care | Payer: Medicare HMO | Source: Ambulatory Visit | Attending: Family Medicine | Admitting: Family Medicine

## 2013-09-11 DIAGNOSIS — E2839 Other primary ovarian failure: Secondary | ICD-10-CM

## 2013-09-11 DIAGNOSIS — Z1231 Encounter for screening mammogram for malignant neoplasm of breast: Secondary | ICD-10-CM

## 2013-09-11 DIAGNOSIS — Z1239 Encounter for other screening for malignant neoplasm of breast: Secondary | ICD-10-CM

## 2013-09-17 ENCOUNTER — Encounter: Payer: Self-pay | Admitting: Family Medicine

## 2013-10-16 ENCOUNTER — Telehealth: Payer: Self-pay

## 2013-10-16 MED ORDER — GLUCOSE BLOOD VI STRP: ORAL_STRIP | Status: DC

## 2013-10-16 NOTE — Telephone Encounter (Signed)
[facility] called and stated pt req'd One Touch Ultra test strips. Gave OK to fill w/this brand and will put in EPIC.

## 2013-10-26 ENCOUNTER — Encounter: Payer: Self-pay | Admitting: Family Medicine

## 2013-12-03 ENCOUNTER — Encounter: Payer: Self-pay | Admitting: Family Medicine

## 2013-12-03 ENCOUNTER — Ambulatory Visit (INDEPENDENT_AMBULATORY_CARE_PROVIDER_SITE_OTHER): Payer: Medicare HMO | Admitting: Family Medicine

## 2013-12-03 VITALS — BP 134/66 | HR 55 | Temp 98.4°F | Resp 16 | Ht 63.0 in | Wt 194.6 lb

## 2013-12-03 DIAGNOSIS — E119 Type 2 diabetes mellitus without complications: Secondary | ICD-10-CM

## 2013-12-03 DIAGNOSIS — IMO0001 Reserved for inherently not codable concepts without codable children: Secondary | ICD-10-CM

## 2013-12-03 DIAGNOSIS — Z23 Encounter for immunization: Secondary | ICD-10-CM

## 2013-12-03 DIAGNOSIS — E1165 Type 2 diabetes mellitus with hyperglycemia: Principal | ICD-10-CM

## 2013-12-03 DIAGNOSIS — R079 Chest pain, unspecified: Secondary | ICD-10-CM

## 2013-12-03 DIAGNOSIS — I10 Essential (primary) hypertension: Secondary | ICD-10-CM

## 2013-12-03 LAB — POCT GLYCOSYLATED HEMOGLOBIN (HGB A1C): Hemoglobin A1C: 7.4

## 2013-12-03 MED ORDER — GLIPIZIDE 5 MG PO TABS: ORAL_TABLET | ORAL | Status: DC

## 2013-12-03 MED ORDER — SIMVASTATIN 20 MG PO TABS: ORAL_TABLET | ORAL | Status: DC

## 2013-12-03 MED ORDER — VALSARTAN-HYDROCHLOROTHIAZIDE 160-25 MG PO TABS: ORAL_TABLET | ORAL | Status: DC

## 2013-12-03 MED ORDER — GLUCOSE BLOOD VI STRP: ORAL_STRIP | Status: DC

## 2013-12-03 NOTE — Patient Instructions (Signed)
Your hemoglobin A1c looks ok today.  Keep up the good work and please come and see [place] in about 4 months for a recheck.   Please give your heart doctor a call regarding your episode of chest pain last week and schedule a follow-up appointment.

## 2013-12-03 NOTE — Progress Notes (Signed)
[facility] [address], [place] [address] [phone]  Date:  12/03/2013   Name:  Catherine Joseph   DOB:  30-Sep-1940   MRN:  [id number]  PCP:  Kennon Portela, MD    Chief Complaint: Follow-up   History of Present Illness:  Catherine Joseph is a 73 y.o. very pleasant female patient who presents with the following:  She is here today to follow-up her DM . She just needs an A1c today, the rest of her labs are ok.  She does notice "tingling in all my body." She has noted this for about one month.    She had a negative stress echo and cardiology evaluation in 2013.  Last week she aparently had an episode of CP that occurred at rest and lasted 5 minutes.  This has not recurred.  She is noted to have a history of bradycardia but has not had any dizziness more recently.    Patient Active Problem List   Diagnosis Date Noted  . 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 Lung nodule 02/01/2012  . Mixed hyperlipidemia 08/03/2011  . Bradycardia 08/03/2011  . Dizzy spells 08/03/2011  . Hypertension 07/22/2011  . Diabetes mellitus type II 07/22/2011    Past Medical History  Diagnosis Date  . Hypertension   . Diabetes mellitus type II   . Syncope   . Bradycardia   . Weakness   . Cataract   . Osteoporosis     Past Surgical History  Procedure Laterality Date  . Eye surgery    . Cesarean section    . Abdominal hysterectomy      History  Substance Use Topics  . Smoking status: Never Smoker   . Smokeless tobacco: Not on file  . Alcohol Use: No    Family History  Problem Relation Age of Onset  . Diabetes Sister     Allergies  Allergen Reactions  . Codeine Other (See Comments)    vertigo    Medication list has been reviewed and updated.  Current Outpatient Prescriptions on File Prior to Visit  Medication Sig Dispense Refill  . fluconazole (DIFLUCAN) 150 MG tablet Take 1 tablet (150 mg total) by mouth once. Can use weekly as needed  6  tablet  0  . glipiZIDE (GLUCOTROL) 5 MG tablet take 1 tablet by mouth every morning before BREAKFAST  90 tablet  3  . glucose blood test strip 1 each by Other route as needed for other. Use as instructed. Care Sens N Brand Strips      . simvastatin (ZOCOR) 20 MG tablet take 1 tablet by mouth every evening  90 tablet  3  . valsartan-hydrochlorothiazide (DIOVAN-HCT) 160-25 MG per tablet take 1 tablet by mouth once daily  90 tablet  2  . diclofenac (VOLTAREN) 75 MG EC tablet Take 1 tablet (75 mg total) by mouth 2 (two) times daily.  30 tablet  0  . glucose blood test strip Test blood sugar daily. Dx code: 250.00  100 each  3  . traMADol (ULTRAM) 50 MG tablet Take 1 tablet (50 mg total) by mouth every 8 (eight) hours as needed.  30 tablet  0   No current facility-administered medications on file prior to visit.    Review of Systems:  As per HPI- otherwise negative.   Physical Examination: Filed Vitals:   12/03/13 0851  BP: 134/66  Pulse: 55  Temp: 98.4 F (36.9 C)  Resp: 16   Filed  Vitals:   12/03/13 0851  Height: 5\' 3"  (1.6 m)  Weight: 194 lb 9.6 oz (88.27 kg)   Body mass index is 34.48 kg/(m^2). Ideal Body Weight: Weight in (lb) to have BMI = 25: 140.8  GEN: WDWN, NAD, Non-toxic, A & O x 3, obese, looks well HEENT: Atraumatic, Normocephalic. Neck supple. No masses, No LAD. Ears and Nose: No external deformity. CV: RRR, No M/G/R. No JVD. No thrill. No extra heart sounds. PULM: CTA B, no wheezes, crackles, rhonchi. No retractions. No resp. distress. No accessory muscle use. EXTR: No c/c/e NEURO Normal gait.  PSYCH: Normally interactive. Conversant. Not depressed or anxious appearing.  Calm demeanor.  Foot exam performed today  EKG:  Sinus bradycardia- OW normal   Results for orders placed in visit on 12/03/13  POCT GLYCOSYLATED HEMOGLOBIN (HGB A1C)      Result Value Ref Range   Hemoglobin A1C 7.4       Assessment and Plan: Type II or unspecified type diabetes  mellitus without mention of complication, uncontrolled - Plan: HM Diabetes Foot Exam, POCT glycosylated hemoglobin (Hb A1C)  Type II or unspecified type diabetes mellitus without mention of complication, not stated as uncontrolled - Plan: glipiZIDE (GLUCOTROL) 5 MG tablet, glucose blood test strip, simvastatin (ZOCOR) 20 MG tablet  HTN (hypertension) - Plan: valsartan-hydrochlorothiazide (DIOVAN-HCT) 160-25 MG per tablet  Chest pain - Plan: EKG 12-Lead  Immunization due  Need for prophylactic vaccination against Streptococcus pneumoniae (pneumococcus) - Plan: Pneumococcal conjugate vaccine 13-valent IM  A1c is acceptable today.  Given prevnar today.  Asked to to schedule a follow-up with Dr. Johnsie Cancel regarding her recent CP- she agreed to do so.   If any further episodes of CP she will seek care.    Signed Lamar Blinks, MD

## 2014-01-09 ENCOUNTER — Telehealth: Payer: Self-pay

## 2014-01-09 NOTE — Telephone Encounter (Signed)
90 day supply with 3 refills was sent in at [facility] in June. Called pharmacy they have the prescription. Pt needs to contact pharmacy. Pt notified.

## 2014-01-09 NOTE — Telephone Encounter (Signed)
PT STATES DR COPLAND GAVE HER ALL HER MEDS EXCEPT THE VALSARTAN, SHE ONLY HAVE 5 LEFT AND HAVE BEEN TRYING TO CALL THE PHARMACY PLEASE CALL [phone]    [facility] [address]

## 2014-02-11 ENCOUNTER — Encounter: Payer: Self-pay | Admitting: Cardiovascular Disease

## 2014-02-11 ENCOUNTER — Ambulatory Visit (INDEPENDENT_AMBULATORY_CARE_PROVIDER_SITE_OTHER): Payer: Medicare HMO | Admitting: Cardiovascular Disease

## 2014-02-11 VITALS — BP 132/78 | HR 50 | Ht 64.0 in | Wt 196.8 lb

## 2014-02-11 DIAGNOSIS — I10 Essential (primary) hypertension: Secondary | ICD-10-CM

## 2014-02-11 DIAGNOSIS — R001 Bradycardia, unspecified: Secondary | ICD-10-CM

## 2014-02-11 DIAGNOSIS — I498 Other specified cardiac arrhythmias: Secondary | ICD-10-CM

## 2014-02-11 DIAGNOSIS — R079 Chest pain, unspecified: Secondary | ICD-10-CM

## 2014-02-11 NOTE — Assessment & Plan Note (Signed)
Atypical normal exam and ECG  F/U ETT

## 2014-02-11 NOTE — Assessment & Plan Note (Signed)
Chronic asymptomatic with no AV block  ETT for assessment of chronotropic competence Avoid beta blockers

## 2014-02-11 NOTE — Patient Instructions (Signed)
Your physician wants you to follow-up in:   YEAR WITH  DR NISHAN You will receive a reminder letter in the mail two months in advance. If you don't receive a letter, please call our office to schedule the follow-up appointment.  Your physician recommends that you continue on your current medications as directed. Please refer to the Current Medication list given to you today.   Your physician has requested that you have an exercise tolerance test. For further information please visit [email or web address]. Please also follow instruction sheet, as given.  

## 2014-02-11 NOTE — Progress Notes (Signed)
Patient ID: Catherine Joseph, female   DOB: 12/13/1940, 73 y.o.   MRN: [id number] 73 yo [place] female initially  referred by Dr Verline Lema 2/13 for bradycardia and dizzyness. Basically weak and "whoozy"  . No SSCP. No previous w/u for structural heart disease. No history of seizures or neurologic issues. CRF;s DM/HTN and elevated lipiids. No history of hypoglycemia. Labs reviewed from 1/13 and TSH normal not anemic and HbA1c 7.1   Normal stress echo 08/27/11  Reviewed event monitor SR PAC/PVC no significant arrhythmia  Referred back by Dr Edilia Bo for atypical chest pain Last few weeks has had "tingling" in chest  Not exertional lasts minutes More at night Can go to gym and Zumba class without difficulty.     ROS: Denies fever, malais, weight loss, blurry vision, decreased visual acuity, cough, sputum, SOB, hemoptysis, pleuritic pain, palpitaitons, heartburn, abdominal pain, melena, lower extremity edema, claudication, or rash.  All other systems reviewed and negative  General: Affect appropriate Healthy:  appears stated age 73: normal Neck supple with no adenopathy JVP normal no bruits no thyromegaly Lungs clear with no wheezing and good diaphragmatic motion Heart:  S1/S2 no murmur, no rub, gallop or click PMI normal Abdomen: benighn, BS positve, no tenderness, no AAA no bruit.  No HSM or HJR Distal pulses intact with no bruits No edema Neuro non-focal Skin warm and dry No muscular weakness   Current Outpatient Prescriptions  Medication Sig Dispense Refill  . diclofenac (VOLTAREN) 75 MG EC tablet Take 1 tablet (75 mg total) by mouth 2 (two) times daily.  30 tablet  0  . fluconazole (DIFLUCAN) 150 MG tablet Take 1 tablet (150 mg total) by mouth once. Can use weekly as needed  6 tablet  0  . glipiZIDE (GLUCOTROL) 5 MG tablet take 1 tablet by mouth every morning before BREAKFAST  90 tablet  3  . glucose blood test strip Test blood sugar daily. Dx code: 250.00  100 each  3  .  glucose blood test strip Use as instructed. Care Sens N Brand Strips  100 each  6  . simvastatin (ZOCOR) 20 MG tablet take 1 tablet by mouth every evening  90 tablet  3  . traMADol (ULTRAM) 50 MG tablet Take 1 tablet (50 mg total) by mouth every 8 (eight) hours as needed.  30 tablet  0  . valsartan-hydrochlorothiazide (DIOVAN-HCT) 160-25 MG per tablet take 1 tablet by mouth once daily  90 tablet  3   No current facility-administered medications for this visit.    Allergies  Codeine  Electrocardiogram:  6/15  SR rate 49 normal   Assessment and Plan

## 2014-02-11 NOTE — Assessment & Plan Note (Signed)
Well controlled.  Continue current medications and low sodium Dash type diet.    

## 2014-02-11 NOTE — Assessment & Plan Note (Signed)
Discussed low carb diet.  Target hemoglobin A1c is 6.5 or less.  Continue current medications. On ARB for renal protection

## 2014-03-11 ENCOUNTER — Encounter: Payer: Self-pay | Admitting: Nurse Practitioner

## 2014-03-11 ENCOUNTER — Encounter: Payer: Self-pay | Admitting: *Deleted

## 2014-03-11 ENCOUNTER — Encounter (INDEPENDENT_AMBULATORY_CARE_PROVIDER_SITE_OTHER): Payer: Medicare HMO

## 2014-03-11 ENCOUNTER — Ambulatory Visit (INDEPENDENT_AMBULATORY_CARE_PROVIDER_SITE_OTHER): Payer: Medicare HMO | Admitting: Nurse Practitioner

## 2014-03-11 VITALS — BP 147/74 | HR 56

## 2014-03-11 DIAGNOSIS — I495 Sick sinus syndrome: Secondary | ICD-10-CM

## 2014-03-11 DIAGNOSIS — I493 Ventricular premature depolarization: Secondary | ICD-10-CM

## 2014-03-11 DIAGNOSIS — I498 Other specified cardiac arrhythmias: Secondary | ICD-10-CM

## 2014-03-11 DIAGNOSIS — R001 Bradycardia, unspecified: Secondary | ICD-10-CM

## 2014-03-11 DIAGNOSIS — I491 Atrial premature depolarization: Secondary | ICD-10-CM

## 2014-03-11 DIAGNOSIS — R079 Chest pain, unspecified: Secondary | ICD-10-CM

## 2014-03-11 DIAGNOSIS — I4949 Other premature depolarization: Secondary | ICD-10-CM

## 2014-03-11 NOTE — Patient Instructions (Signed)
We are going to place a 24 hour heart monitor  We are going to arrange for an echocardiogram and a chemical stress test Carlton Adam)  See Dr. Johnsie Cancel back for discussion.  Call the [place] office at [phone] if you have any questions, problems or concerns.

## 2014-03-11 NOTE — Progress Notes (Signed)
Patient ID: Catherine Joseph, female   DOB: 09-Jun-1941, 73 y.o.   MRN: [id number] [employer] 24 hour holter monitor applied to patient.

## 2014-03-11 NOTE — Progress Notes (Signed)
Exercise Treadmill Test  Pre-Exercise Testing Evaluation Rhythm: sinus bradycardia  Rate: 57     Test  Exercise Tolerance Test Ordering MD: Jenkins Rouge, MD  Interpreting MD: Truitt Merle, NP  Unique Test No: 1  Treadmill:  1  Indication for ETT: chest pain - rule out ischemia  Contraindication to ETT: No   Stress Modality: exercise - treadmill  Cardiac Imaging Performed: non   Protocol: standard Bruce - maximal  Max BP:  197/102  Max MPHR (bpm):  145 85% MPR (bpm):  123  MPHR obtained (bpm):  108 % MPHR obtained:  75%  Reached 85% MPHR (min:sec):  NA Total Exercise Time (min-sec):  2:20  Workload in METS:  4.6 Borg Scale: 13  Reason ETT Terminated:  patient's desire to stop    ST Segment Analysis At Rest: normal ST segments - no evidence of significant ST depression With Exercise: no evidence of significant ST depression  Other Information Arrhythmia:  Yes Angina during ETT:  absent (0) Quality of ETT:  non-diagnostic  ETT Interpretation:  Non-diagnostic. Target HR not achieved.   Comments: Patient presents today for routine GXT. Has long standing HTN and DM. Referred for GXT for atypical chest pain. Says she is now doing ok.  She does endorse palpitations and presyncope as well. She is limited by knee pain. She has a resting bradycardia noted on resting EKG.   Today the patient exercised on the standard Bruce protocol for a total of 2:20 minutes.  Poor exercise tolerance. Mets of only 4.6.  Mildly hypertensive blood pressure response.  Clinically negative for chest pain. Test was stopped due to patient's desire to stop/fatigue.  EKG negative for ischemia but the study is non-diagnostic. She has multiple PVCs, PACs, and multiple runs of PSVT/?AF.   Recommendations: 24 hour Holter to assess her HR - looks to have tachy brady.  Lexiscan - she is not able to walk on a treadmill - limited by knee pain.  Echocardiogram to rule out structural defects.  Follow up with Dr. Johnsie Cancel  - may need EP referral for PPM.  I have left her on her current medicines - no changes made today.   Patient is agreeable to this plan and will call if any problems develop in the interim.   Burtis Junes, RN, [facility] [address] [place], [place]  [address] [phone]

## 2014-03-21 ENCOUNTER — Ambulatory Visit (HOSPITAL_BASED_OUTPATIENT_CLINIC_OR_DEPARTMENT_OTHER): Payer: Medicare HMO | Admitting: Radiology

## 2014-03-21 ENCOUNTER — Ambulatory Visit (HOSPITAL_COMMUNITY): Payer: Medicare HMO | Attending: Cardiology | Admitting: Radiology

## 2014-03-21 VITALS — BP 133/79 | Ht 64.0 in | Wt 194.0 lb

## 2014-03-21 DIAGNOSIS — I10 Essential (primary) hypertension: Secondary | ICD-10-CM | POA: Insufficient documentation

## 2014-03-21 DIAGNOSIS — R001 Bradycardia, unspecified: Secondary | ICD-10-CM

## 2014-03-21 DIAGNOSIS — E785 Hyperlipidemia, unspecified: Secondary | ICD-10-CM | POA: Insufficient documentation

## 2014-03-21 DIAGNOSIS — E119 Type 2 diabetes mellitus without complications: Secondary | ICD-10-CM | POA: Diagnosis not present

## 2014-03-21 DIAGNOSIS — R42 Dizziness and giddiness: Secondary | ICD-10-CM | POA: Diagnosis present

## 2014-03-21 DIAGNOSIS — I495 Sick sinus syndrome: Secondary | ICD-10-CM

## 2014-03-21 DIAGNOSIS — I491 Atrial premature depolarization: Secondary | ICD-10-CM | POA: Insufficient documentation

## 2014-03-21 DIAGNOSIS — R079 Chest pain, unspecified: Secondary | ICD-10-CM

## 2014-03-21 DIAGNOSIS — I493 Ventricular premature depolarization: Secondary | ICD-10-CM

## 2014-03-21 MED ORDER — TECHNETIUM TC 99M SESTAMIBI GENERIC - CARDIOLITE
33.0000 | Freq: Once | INTRAVENOUS | Status: AC | PRN
  Administered 2014-03-21: 33 via INTRAVENOUS

## 2014-03-21 MED ORDER — REGADENOSON 0.4 MG/5ML IV SOLN
0.4000 mg | Freq: Once | INTRAVENOUS | Status: AC
  Administered 2014-03-21: 0.4 mg via INTRAVENOUS

## 2014-03-21 MED ORDER — TECHNETIUM TC 99M SESTAMIBI GENERIC - CARDIOLITE
11.0000 | Freq: Once | INTRAVENOUS | Status: AC | PRN
Start: 2014-03-21 — End: 2014-03-21
  Administered 2014-03-21: 11 via INTRAVENOUS

## 2014-03-21 NOTE — Progress Notes (Signed)
[facility] [address] [place], [place] [address] [phone]    Cardiology Nuclear Med Study  [name] is a 73 y.o. female     MRN : [id number]     DOB: 05-05-1941  Procedure Date: 03/21/2014  Nuclear Med Background Indication for Stress Test:  Evaluation for Ischemia and 9/15 undiagnostic GXT History:  n/a Cardiac Risk Factors: Hypertension, NIDDM  Symptoms:  Chest Pain and Dizziness   Nuclear Pre-Procedure Caffeine/Decaff Intake:  None NPO After: 7:00pm   Lungs:  clear O2 Sat: 98% on room air. IV 0.9% NS with Angio Cath:  22g  IV Site: R Hand  IV Started by:  Crissie Figures, RN  Chest Size (in):  38 Cup Size: C  Height: 5\' 4"  (1.626 m)  Weight:  194 lb (87.998 kg)  BMI:  Body mass index is 33.28 kg/(m^2). Tech Comments:  N/A    Nuclear Med Study 1 or 2 day study: 1 day  Stress Test Type:  Lexiscan  Reading MD: N/A  Order Authorizing Provider:  Jenkins Rouge, MD  Resting Radionuclide: Technetium 77m Sestamibi  Resting Radionuclide Dose: 11.0 mCi   Stress Radionuclide:  Technetium 73m Sestamibi  Stress Radionuclide Dose: 33.0 mCi           Stress Protocol Rest HR: 47 Stress HR: 78  Rest BP: 133/79 Stress BP: 171/110  Exercise Time (min): n/a METS: n/a   Predicted Max HR: 145 bpm % Max HR: 53.79 bpm Rate Pressure Product: 13338   Dose of Adenosine (mg):  n/a Dose of Lexiscan: 0.4 mg  Dose of Atropine (mg): n/a Dose of Dobutamine: n/a mcg/kg/min (at max HR)  Stress Test Technologist: Perrin Maltese, EMT-P  Nuclear Technologist:  Earl Many, CNMT     Rest Procedure:  Myocardial perfusion imaging was performed at rest 45 minutes following the intravenous administration of Technetium 82m Sestamibi. Rest ECG: Sinus bradycardia. Normal EKG  Stress Procedure:  The patient received IV Lexiscan 0.4 mg over 15-seconds.  Technetium 42m Sestamibi injected at 30-seconds. This patient had throat tightness, abdominal pain, and  was hot with the Lexiscan injection. Quantitative spect images were obtained after a 45 minute delay. Stress ECG: There are mild ST-T wave changes with infusion.  QPS Raw Data Images:  Normal; no motion artifact; normal heart/lung ratio. Stress Images:  Normal homogeneous uptake in all areas of the myocardium. Rest Images:  Normal homogeneous uptake in all areas of the myocardium. Subtraction (SDS):  No evidence of ischemia. Transient Ischemic Dilatation (Normal <1.22):  10.4 Lung/Heart Ratio (Normal <0.45):  0.28  Quantitative Gated Spect Images QGS EDV:  85 ml QGS ESV:  30 ml  Impression Exercise Capacity:  Lexiscan with no exercise. BP Response:  Normal blood pressure response. Clinical Symptoms:  Patient had throat tightness and abdominal discomfort and felt hot ECG Impression:  There are mild ST-T wave changes with infusion Comparison with Prior Nuclear Study: No images to compare  Overall Impression:  Normal stress nuclear study. There is no scar or ischemia. This is a low risk scan.  LV Ejection Fraction: 64%.  LV Wall Motion:  Normal Wall Motion.  Dola Argyle, MD

## 2014-03-21 NOTE — Progress Notes (Signed)
Echocardiogram performed.  

## 2014-03-27 ENCOUNTER — Ambulatory Visit (INDEPENDENT_AMBULATORY_CARE_PROVIDER_SITE_OTHER): Payer: Medicare HMO

## 2014-03-27 ENCOUNTER — Ambulatory Visit (INDEPENDENT_AMBULATORY_CARE_PROVIDER_SITE_OTHER): Payer: Medicare HMO | Admitting: Emergency Medicine

## 2014-03-27 ENCOUNTER — Ambulatory Visit (HOSPITAL_COMMUNITY)
Admission: RE | Admit: 2014-03-27 | Discharge: 2014-03-27 | Disposition: A | Discharge status: Home / Self Care | Payer: Medicare HMO | Source: Ambulatory Visit | Attending: Emergency Medicine | Admitting: Emergency Medicine

## 2014-03-27 VITALS — BP 124/78 | HR 65 | Temp 98.4°F | Resp 16 | Ht 63.75 in | Wt 190.4 lb

## 2014-03-27 DIAGNOSIS — R1031 Right lower quadrant pain: Secondary | ICD-10-CM

## 2014-03-27 DIAGNOSIS — R319 Hematuria, unspecified: Secondary | ICD-10-CM

## 2014-03-27 DIAGNOSIS — R109 Unspecified abdominal pain: Secondary | ICD-10-CM | POA: Insufficient documentation

## 2014-03-27 DIAGNOSIS — R312 Other microscopic hematuria: Secondary | ICD-10-CM | POA: Insufficient documentation

## 2014-03-27 DIAGNOSIS — R1110 Vomiting, unspecified: Secondary | ICD-10-CM | POA: Diagnosis not present

## 2014-03-27 LAB — POCT UA - MICROSCOPIC ONLY
Bacteria, U Microscopic: NEGATIVE
CRYSTALS, UR, HPF, POC: NEGATIVE
Casts, Ur, LPF, POC: NEGATIVE
Mucus, UA: NEGATIVE
YEAST UA: NEGATIVE

## 2014-03-27 LAB — POCT CBC
Granulocyte percent: 73.9 %G (ref 37–80)
HCT, POC: 44.5 % (ref 37.7–47.9)
Hemoglobin: 13.8 g/dL (ref 12.2–16.2)
Lymph, poc: 1.5 (ref 0.6–3.4)
MCH, POC: 27 pg (ref 27–31.2)
MCHC: 30.9 g/dL — AB (ref 31.8–35.4)
MCV: 87.3 fL (ref 80–97)
MID (CBC): 0.1 (ref 0–0.9)
MPV: 9.2 fL (ref 0–99.8)
PLATELET COUNT, POC: 147 10*3/uL (ref 142–424)
POC Granulocyte: 4.6 (ref 2–6.9)
POC LYMPH %: 24 % (ref 10–50)
POC MID %: 2.1 % (ref 0–12)
RBC: 5.1 M/uL (ref 4.04–5.48)
RDW, POC: 15.4 %
WBC: 6.2 10*3/uL (ref 4.6–10.2)

## 2014-03-27 LAB — POCT URINALYSIS DIPSTICK
Bilirubin, UA: NEGATIVE
GLUCOSE UA: NEGATIVE
Ketones, UA: NEGATIVE
Leukocytes, UA: NEGATIVE
NITRITE UA: NEGATIVE
PROTEIN UA: 100
SPEC GRAV UA: 1.025
UROBILINOGEN UA: 0.2
pH, UA: 7.5

## 2014-03-27 MED ORDER — DICYCLOMINE HCL 20 MG PO TABS: 20.0000 mg | ORAL_TABLET | Freq: Three times a day (TID) | ORAL | Status: DC

## 2014-03-27 NOTE — Progress Notes (Signed)
Subjective:    Patient ID: Catherine Joseph, female    DOB: 07/22/1940, 73 y.o.   MRN: [id number]  HPI Pt is here complaining of abdominal pain that started this morning around 5:00. She describes this as a sharp pain. She reports being unable to sleep because the pain was so severe. She has vomited 2 times today. She denies being constipated.She states she had a normal bowel movement this morning. She denies any surgery on her abdomen and states she still has her appendix and gallbladder.  She denies abdominal bloating or cramping. She denies fever or chills. She denies urinary symptoms. She reports having this same type of abdominal pain in her 20's.     Review of Systems     Objective:   Physical Exam HEENT exam is unremarkable. Chest is clear to auscultation and percussion. Heart regular rate no murmurs. Abdomen soft. There is significant tenderness deep in the right lower quadrant      Results for orders placed in visit on 03/27/14  POCT CBC      Result Value Ref Range   WBC 6.2  4.6 - 10.2 K/uL   Lymph, poc 1.5  0.6 - 3.4   POC LYMPH PERCENT 24.0  10 - 50 %L   MID (cbc) 0.1  0 - 0.9   POC MID % 2.1  0 - 12 %M   POC Granulocyte 4.6  2 - 6.9   Granulocyte percent 73.9  37 - 80 %G   RBC 5.10  4.04 - 5.48 M/uL   Hemoglobin 13.8  12.2 - 16.2 g/dL   HCT, POC 44.5  37.7 - 47.9 %   MCV 87.3  80 - 97 fL   MCH, POC 27.0  27 - 31.2 pg   MCHC 30.9 (*) 31.8 - 35.4 g/dL   RDW, POC 15.4     Platelet Count, POC 147  142 - 424 K/uL   MPV 9.2  0 - 99.8 fL  POCT URINALYSIS DIPSTICK      Result Value Ref Range   Color, UA yellow     Clarity, UA clear     Glucose, UA neg     Bilirubin, UA neg     Ketones, UA neg     Spec Grav, UA 1.025     Blood, UA moderate     pH, UA 7.5     Protein, UA 100     Urobilinogen, UA 0.2     Nitrite, UA neg     Leukocytes, UA Negative    POCT UA - MICROSCOPIC ONLY      Result Value Ref Range   WBC, Ur, HPF, POC 0-3     RBC, urine, microscopic  4-6     Bacteria, U Microscopic neg     Mucus, UA neg     Epithelial cells, urine per micros 0-2     Crystals, Ur, HPF, POC neg     Casts, Ur, LPF, POC neg     Yeast, UA neg     [facility] reading (PRIMARY) by  Dr. Everlene Farrier there is a large amount of stool. No stones are seen. There is no free air the chest x-ray is normal   Assessment & Plan:  Patient has significant right lower quadrant abdominal discomfort associated with hematuria raising the possibility of a stone. She will need a CT.   Spoke with pt's daughter Catherine Joseph. CT scan is negative for a kidney stone. Will start on Miralax 1-2 x qd  and Bentyl 20mg  TID #30 1 RF

## 2014-03-27 NOTE — Patient Instructions (Signed)
Your CT scan will be done at [facility]. Please head straight there and go into admitting. There they will register you and send you straight to CT. After your scan, they will call Dr. Everlene Farrier with your results and we will contact you

## 2014-03-28 ENCOUNTER — Telehealth: Payer: Self-pay | Admitting: Emergency Medicine

## 2014-03-28 ENCOUNTER — Other Ambulatory Visit: Payer: Self-pay | Admitting: Emergency Medicine

## 2014-03-28 DIAGNOSIS — K439 Ventral hernia without obstruction or gangrene: Secondary | ICD-10-CM

## 2014-03-29 ENCOUNTER — Telehealth: Payer: Self-pay | Admitting: Emergency Medicine

## 2014-03-29 NOTE — Telephone Encounter (Signed)
Pt's daughter called to request a different pain rx. The side effects from the dicyclomine are too severe.

## 2014-03-29 NOTE — Telephone Encounter (Signed)
I called and spoke with patient. She is feeling better. We'll schedule patient to see general surgeon regarding her hernia

## 2014-03-29 NOTE — Telephone Encounter (Signed)
Patient's daughter called again. Please return call. [phone]

## 2014-03-30 ENCOUNTER — Telehealth: Payer: Self-pay | Admitting: Emergency Medicine

## 2014-03-30 NOTE — Telephone Encounter (Signed)
Doing better. Stop dicyclomine. Cont miralax and ibuprofen. Referral to surgeon

## 2014-03-30 NOTE — Telephone Encounter (Signed)
See other phone messages. Pt is feeling better.

## 2014-04-02 ENCOUNTER — Telehealth: Payer: Self-pay | Admitting: *Deleted

## 2014-04-02 NOTE — Telephone Encounter (Signed)
ATTEMPTED TO CALL PT  PHONE  JUST RINGS NO  ANSWER  RE  MONITOR  PER  DR NISHAN   FLUTTER , PVC"S PAC'S  HISTORY OF BRADYCARDIA   START  FLECAINIDE 50 MG  BID  AND  F/U  WITH  EP  WILL TRY  PT  AT  A LATER  DATE .Adonis Housekeeper

## 2014-04-05 MED ORDER — FLECAINIDE ACETATE 50 MG PO TABS: 50.0000 mg | ORAL_TABLET | Freq: Two times a day (BID) | ORAL | Status: DC

## 2014-04-05 NOTE — Telephone Encounter (Signed)
LEFT MESSAGE ON DAUGHTERS MOBILE  NUMBER  TO CALL BACK  RE   MONITOR RESULTS .Adonis Housekeeper

## 2014-04-05 NOTE — Telephone Encounter (Signed)
PT'S DAUGHTER AWARE OF  MONITOR RESULTS./CY

## 2014-04-07 ENCOUNTER — Encounter (HOSPITAL_COMMUNITY): Payer: Self-pay | Admitting: Emergency Medicine

## 2014-04-07 ENCOUNTER — Emergency Department (HOSPITAL_COMMUNITY)
Admission: EM | Admit: 2014-04-07 | Discharge: 2014-04-07 | Disposition: A | Discharge status: Home / Self Care | Payer: Medicare HMO | Attending: Emergency Medicine | Admitting: Emergency Medicine

## 2014-04-07 ENCOUNTER — Emergency Department (HOSPITAL_COMMUNITY): Payer: Medicare HMO

## 2014-04-07 DIAGNOSIS — Z79899 Other long term (current) drug therapy: Secondary | ICD-10-CM | POA: Diagnosis not present

## 2014-04-07 DIAGNOSIS — R509 Fever, unspecified: Secondary | ICD-10-CM | POA: Insufficient documentation

## 2014-04-07 DIAGNOSIS — R1030 Lower abdominal pain, unspecified: Secondary | ICD-10-CM | POA: Diagnosis present

## 2014-04-07 DIAGNOSIS — Z8739 Personal history of other diseases of the musculoskeletal system and connective tissue: Secondary | ICD-10-CM | POA: Insufficient documentation

## 2014-04-07 DIAGNOSIS — R059 Cough, unspecified: Secondary | ICD-10-CM

## 2014-04-07 DIAGNOSIS — Z8669 Personal history of other diseases of the nervous system and sense organs: Secondary | ICD-10-CM | POA: Diagnosis not present

## 2014-04-07 DIAGNOSIS — E119 Type 2 diabetes mellitus without complications: Secondary | ICD-10-CM | POA: Diagnosis not present

## 2014-04-07 DIAGNOSIS — I10 Essential (primary) hypertension: Secondary | ICD-10-CM | POA: Insufficient documentation

## 2014-04-07 DIAGNOSIS — R748 Abnormal levels of other serum enzymes: Secondary | ICD-10-CM | POA: Diagnosis not present

## 2014-04-07 DIAGNOSIS — Z9049 Acquired absence of other specified parts of digestive tract: Secondary | ICD-10-CM | POA: Diagnosis not present

## 2014-04-07 DIAGNOSIS — R1084 Generalized abdominal pain: Secondary | ICD-10-CM | POA: Insufficient documentation

## 2014-04-07 DIAGNOSIS — R05 Cough: Secondary | ICD-10-CM | POA: Insufficient documentation

## 2014-04-07 DIAGNOSIS — R319 Hematuria, unspecified: Secondary | ICD-10-CM

## 2014-04-07 LAB — URINALYSIS, ROUTINE W REFLEX MICROSCOPIC
Glucose, UA: NEGATIVE mg/dL
Ketones, ur: NEGATIVE mg/dL
NITRITE: NEGATIVE
Protein, ur: 30 mg/dL — AB
SPECIFIC GRAVITY, URINE: 1.014 (ref 1.005–1.030)
Urobilinogen, UA: 2 mg/dL — ABNORMAL HIGH (ref 0.0–1.0)
pH: 5.5 (ref 5.0–8.0)

## 2014-04-07 LAB — URINE MICROSCOPIC-ADD ON

## 2014-04-07 LAB — COMPREHENSIVE METABOLIC PANEL
ALK PHOS: 244 U/L — AB (ref 39–117)
ALT: 34 U/L (ref 0–35)
AST: 30 U/L (ref 0–37)
Albumin: 2.5 g/dL — ABNORMAL LOW (ref 3.5–5.2)
Anion gap: 13 (ref 5–15)
BUN: 8 mg/dL (ref 6–23)
CO2: 25 meq/L (ref 19–32)
Calcium: 9.2 mg/dL (ref 8.4–10.5)
Chloride: 98 mEq/L (ref 96–112)
Creatinine, Ser: 0.91 mg/dL (ref 0.50–1.10)
GFR, EST AFRICAN AMERICAN: 70 mL/min — AB (ref >90)
GFR, EST NON AFRICAN AMERICAN: 60 mL/min — AB (ref >90)
GLUCOSE: 115 mg/dL — AB (ref 70–99)
POTASSIUM: 4.1 meq/L (ref 3.7–5.3)
SODIUM: 136 meq/L — AB (ref 137–147)
Total Bilirubin: 0.6 mg/dL (ref 0.3–1.2)
Total Protein: 8 g/dL (ref 6.0–8.3)

## 2014-04-07 LAB — CBC WITH DIFFERENTIAL/PLATELET
BASOS PCT: 0 % (ref 0–1)
Basophils Absolute: 0 10*3/uL (ref 0.0–0.1)
EOS PCT: 0 % (ref 0–5)
Eosinophils Absolute: 0 10*3/uL (ref 0.0–0.7)
HEMATOCRIT: 38.5 % (ref 36.0–46.0)
HEMOGLOBIN: 12.4 g/dL (ref 12.0–15.0)
LYMPHS PCT: 14 % (ref 12–46)
Lymphs Abs: 2 10*3/uL (ref 0.7–4.0)
MCH: 27.3 pg (ref 26.0–34.0)
MCHC: 32.2 g/dL (ref 30.0–36.0)
MCV: 84.8 fL (ref 78.0–100.0)
Monocytes Absolute: 0.9 10*3/uL (ref 0.1–1.0)
Monocytes Relative: 6 % (ref 3–12)
NEUTROS PCT: 80 % — AB (ref 43–77)
Neutro Abs: 11.6 10*3/uL — ABNORMAL HIGH (ref 1.7–7.7)
Platelets: 296 10*3/uL (ref 150–400)
RBC: 4.54 MIL/uL (ref 3.87–5.11)
RDW: 14.4 % (ref 11.5–15.5)
WBC: 14.5 10*3/uL — ABNORMAL HIGH (ref 4.0–10.5)

## 2014-04-07 LAB — I-STAT TROPONIN, ED: Troponin i, poc: 0.01 ng/mL (ref 0.00–0.08)

## 2014-04-07 LAB — LIPASE, BLOOD: Lipase: 27 U/L (ref 11–59)

## 2014-04-07 LAB — I-STAT CG4 LACTIC ACID, ED: LACTIC ACID, VENOUS: 2.08 mmol/L (ref 0.5–2.2)

## 2014-04-07 MED ORDER — MORPHINE SULFATE 4 MG/ML IJ SOLN
4.0000 mg | Freq: Once | INTRAMUSCULAR | Status: AC
  Administered 2014-04-07: 4 mg via INTRAVENOUS
  Filled 2014-04-07: qty 1

## 2014-04-07 MED ORDER — SODIUM CHLORIDE 0.9 % IV BOLUS (SEPSIS)
1000.0000 mL | Freq: Once | INTRAVENOUS | Status: AC
  Administered 2014-04-07: 1000 mL via INTRAVENOUS

## 2014-04-07 MED ORDER — ACETAMINOPHEN 325 MG PO TABS
650.0000 mg | ORAL_TABLET | Freq: Once | ORAL | Status: AC
  Administered 2014-04-07: 650 mg via ORAL
  Filled 2014-04-07: qty 2

## 2014-04-07 MED ORDER — ONDANSETRON HCL 4 MG PO TABS: 4.0000 mg | ORAL_TABLET | Freq: Three times a day (TID) | ORAL | Status: DC | PRN

## 2014-04-07 MED ORDER — ONDANSETRON 4 MG PO TBDP
4.0000 mg | ORAL_TABLET | Freq: Once | ORAL | Status: AC
  Administered 2014-04-07: 4 mg via ORAL
  Filled 2014-04-07: qty 1

## 2014-04-07 MED ORDER — TRAMADOL HCL 50 MG PO TABS: 50.0000 mg | ORAL_TABLET | Freq: Four times a day (QID) | ORAL | Status: DC | PRN

## 2014-04-07 NOTE — ED Provider Notes (Signed)
CSN: [id number]     Arrival date & time 04/07/14  1444 History   First MD Initiated Contact with Patient 04/07/14 1655     Chief Complaint  Patient presents with  . Abdominal Pain     (Consider location/radiation/quality/duration/timing/severity/associated sxs/prior Treatment) HPI  Catherine Joseph This 73 year old female who presents emergency department chief complaint of abdominal pain. She was seen one week ago for the same complaint of of the physicians at the local urgent care. Chest diagnosis having constipation, given Bentyl and MiraLax. The patient states that she has not had one very large bowel movement yet. She states that last week she had a lot of nausea and vomiting. She states she vomited mostly phlegm. She's also had a slight cough. She states that her abdominal pain has been persistent and localizes it to her umbilical hernia. She complains of itching but she denies fevers, chest pain, shortness of breath. She noticed darkening of her urine and one green stool today.  She denies diarrhea.   Past Medical History  Diagnosis Date  . Hypertension   . Diabetes mellitus type II   . Syncope   . Bradycardia   . Weakness   . Cataract   . Osteoporosis    Past Surgical History  Procedure Laterality Date  . Eye surgery    . Cesarean section    . Abdominal hysterectomy     Family History  Problem Relation Age of Onset  . Diabetes Sister    History  Substance Use Topics  . Smoking status: Never Smoker   . Smokeless tobacco: Not on file  . Alcohol Use: No   OB History   Grav Para Term Preterm Abortions TAB SAB Ect Mult Living                 Review of Systems  Ten systems reviewed and are negative for acute change, except as noted in the HPI.    Allergies  Codeine  Home Medications   Prior to Admission medications   Medication Sig Start Date End Date Taking? Authorizing Provider  glipiZIDE (GLUCOTROL) 5 MG tablet Take 5 mg by mouth daily before  breakfast.   Yes Historical Provider, MD  simvastatin (ZOCOR) 20 MG tablet Take 20 mg by mouth daily.   Yes Historical Provider, MD  valsartan-hydrochlorothiazide (DIOVAN-HCT) 160-25 MG per tablet Take 1 tablet by mouth daily.   Yes Historical Provider, MD  dicyclomine (BENTYL) 20 MG tablet Take 1 tablet (20 mg total) by mouth 4 (four) times daily -  before meals and at bedtime. 03/27/14   Darlyne Russian, MD  flecainide (TAMBOCOR) 50 MG tablet Take 1 tablet (50 mg total) by mouth 2 (two) times daily. 04/05/14   Josue Hector, MD   BP 129/57  Pulse 73  Temp(Src) 99.1 F (37.3 C) (Oral)  Resp 17  SpO2 95% Physical Exam  Constitutional: She is oriented to person, place, and time. She appears well-developed and well-nourished. No distress.  HENT:  Head: Normocephalic and atraumatic.  Eyes: Conjunctivae are normal. No scleral icterus.  Neck: Normal range of motion.  Cardiovascular: Normal rate, regular rhythm and normal heart sounds.  Exam reveals no gallop and no friction rub.   No murmur heard. Pulmonary/Chest: Effort normal and breath sounds normal. No respiratory distress.  Abdominal: Soft. Bowel sounds are normal. She exhibits distension. She exhibits no mass. There is tenderness. There is no guarding.  Protuberant abdomen, diffuse abdominal tenderness. Tenderness localizes over the ventral hernia. Tissue  is easily reducible.  Musculoskeletal: Normal range of motion.  Neurological: She is alert and oriented to person, place, and time.  Skin: Skin is warm and dry. She is not diaphoretic.    ED Course  Procedures (including critical care time) Labs Review Labs Reviewed  CBC WITH DIFFERENTIAL - Abnormal; Notable for the following:    WBC 14.5 (*)    Neutrophils Relative % 80 (*)    Neutro Abs 11.6 (*)    All other components within normal limits  COMPREHENSIVE METABOLIC PANEL - Abnormal; Notable for the following:    Sodium 136 (*)    Glucose, Bld 115 (*)    Albumin 2.5 (*)     Alkaline Phosphatase 244 (*)    GFR calc non Af Amer 60 (*)    GFR calc Af Amer 70 (*)    All other components within normal limits  URINALYSIS, ROUTINE W REFLEX MICROSCOPIC - Abnormal; Notable for the following:    Color, Urine AMBER (*)    APPearance CLOUDY (*)    Hgb urine dipstick LARGE (*)    Bilirubin Urine SMALL (*)    Protein, ur 30 (*)    Urobilinogen, UA 2.0 (*)    Leukocytes, UA TRACE (*)    All other components within normal limits  LIPASE, BLOOD  URINE MICROSCOPIC-ADD ON  I-STAT TROPOININ, ED  I-STAT CG4 LACTIC ACID, ED    Imaging Review Dg Abd Acute W/chest  04/07/2014   CLINICAL DATA:  Abdominal pain for 2 weeks.  Vomiting last week.  EXAM: ACUTE ABDOMEN SERIES (ABDOMEN 2 VIEW & CHEST 1 VIEW)  COMPARISON:  CT abdomen and pelvis 03/27/2014. Chest in two views abdomen 03/27/2014.  FINDINGS: Single view of the chest demonstrates cardiomegaly without edema. No pneumothorax or pleural fluid.  Two views of the abdomen show no free intraperitoneal air. The bowel gas pattern is normal. No abnormal abdominal calcification is seen.  IMPRESSION: No acute finding chest or abdomen.  Cardiomegaly.   Electronically Signed   By: Inge Rise M.D.   On: 04/07/2014 18:38     EKG Interpretation None      MDM   Final diagnoses:  None    7:33 PM Filed Vitals:   04/07/14 1455 04/07/14 1855  BP: 133/57 129/57  Pulse: 72 73  Temp: 101.2 F (38.4 C) 99.1 F (37.3 C)  TempSrc: Oral Oral  Resp: 18 17  SpO2: 99% 95%   Patient here with abdominal pain. She has a white count which has risen since her previous labs drawn one week ago. She is slightly elevated glucose level. Normal lactate and troponin. Her urine shows large hemoglobin and urobilinogen. Nausea, urobilinogen in her urine endocrine staff concern for possible hepatic abnormality. She also has elevation in her alkaline phosphatase without transaminitis. Patient is febrile I am unsure of the etiology of her symptoms at  this time. We'll send urine for culture. Chest x-ray is negative for pneumonia and there is no large stone burden.  Filed Vitals:   04/07/14 1855 04/07/14 2039 04/07/14 2057 04/07/14 2142  BP: 129/57 144/62  121/40  Pulse: 73 63  74  Temp: 99.1 F (37.3 C) 98.1 F (36.7 C) 98.3 F (36.8 C)   TempSrc: Oral Oral Oral   Resp: 17 20  20   SpO2: 95% 99%  96%   Patient fever is resolved. +leukocytosis UA sent for culture. She is feeling much better and asking to eat and drink. i feel that her abdominal pain is  related to her ventral hernia. Fever may be related  URI?    Patient is nontoxic, nonseptic appearing, in no apparent distress.  Patient's pain and other symptoms adequately managed in emergency department.  Fluid bolus given.  Labs, imaging and vitals reviewed.  Patient does not meet the SIRS or Sepsis criteria.  On repeat exam patient does not have a surgical abdomin and there are no peritoneal signs.  No indication of appendicitis, bowel obstruction, bowel perforation, cholecystitis, diverticulitis  Patient discharged home with symptomatic treatment and given strict instructions for follow-up with their primary care physician.  I have also discussed reasons to return immediately to the ER.  Patient expresses understanding and agrees with plan. I have discussed the case with Dr. Betsey Holiday who agrees patient is safe for discharge at this time.    Margarita Mail, PA-C 04/08/14 0106  Margarita Mail, PA-C 04/09/14 2046

## 2014-04-07 NOTE — Discharge Instructions (Signed)
Abdominal (belly) pain can be caused by many things. Your caregiver performed an examination and possibly ordered blood/urine tests and imaging (CT scan, x-rays, ultrasound). Many cases can be observed and treated at home after initial evaluation in the emergency department. Even though you are being discharged home, abdominal pain can be unpredictable. Therefore, you need a repeated exam if your pain does not resolve, returns, or worsens. Most patients with abdominal pain don't have to be admitted to the hospital or have surgery, but serious problems like appendicitis and gallbladder attacks can start out as nonspecific pain. Many abdominal conditions cannot be diagnosed in one visit, so follow-up evaluations are very important. SEEK IMMEDIATE MEDICAL ATTENTION IF: The pain does not go away or becomes severe.  A temperature above 101 develops.  Repeated vomiting occurs (multiple episodes).  The pain becomes localized to portions of the abdomen. The right side could possibly be appendicitis. In an adult, the left lower portion of the abdomen could be colitis or diverticulitis.  Blood is being passed in stools or vomit (bright red or black tarry stools).  Return also if you develop chest pain, difficulty breathing, dizziness or fainting, or become confused, poorly responsive, or inconsolable (young children).  Please use tylenol or Advil if you feel that you are having a fever.   Fever, Adult A fever is a higher than normal body temperature. In an adult, an oral temperature around 98.6 F (37 C) is considered normal. A temperature of 100.4 F (38 C) or higher is generally considered a fever. Mild or moderate fevers generally have no long-term effects and often do not require treatment. Extreme fever (greater than or equal to 106 F or 41.1 C) can cause seizures. The sweating that may occur with repeated or prolonged fever may cause dehydration. Elderly people can develop confusion during a fever. A  measured temperature can vary with:  Age.  Time of day.  Method of measurement (mouth, underarm, rectal, or ear). The fever is confirmed by taking a temperature with a thermometer. Temperatures can be taken different ways. Some methods are accurate and some are not.  An oral temperature is used most commonly. Electronic thermometers are fast and accurate.  An ear temperature will only be accurate if the thermometer is positioned as recommended by the manufacturer.  A rectal temperature is accurate and done for those adults who have a condition where an oral temperature cannot be taken.  An underarm (axillary) temperature is not accurate and not recommended. Fever is a symptom, not a disease.  CAUSES   Infections commonly cause fever.  Some noninfectious causes for fever include:  Some arthritis conditions.  Some thyroid or adrenal gland conditions.  Some immune system conditions.  Some types of cancer.  A medicine reaction.  High doses of certain street drugs such as methamphetamine.  Dehydration.  Exposure to high outside or room temperatures.  Occasionally, the source of a fever cannot be determined. This is sometimes called a "fever of unknown origin" (FUO).  Some situations may lead to a temporary rise in body temperature that may go away on its own. Examples are:  Childbirth.  Surgery.  Intense exercise. HOME CARE INSTRUCTIONS   Take appropriate medicines for fever. Follow dosing instructions carefully. If you use acetaminophen to reduce the fever, be careful to avoid taking other medicines that also contain acetaminophen. Do not take aspirin for a fever if you are younger than age 65. There is an association with Reye's syndrome. Reye's syndrome is a  rare but potentially deadly disease.  If an infection is present and antibiotics have been prescribed, take them as directed. Finish them even if you start to feel better.  Rest as needed.  Maintain an  adequate fluid intake. To prevent dehydration during an illness with prolonged or recurrent fever, you may need to drink extra fluid.Drink enough fluids to keep your urine clear or pale yellow.  Sponging or bathing with room temperature water may help reduce body temperature. Do not use ice water or alcohol sponge baths.  Dress comfortably, but do not over-bundle. SEEK MEDICAL CARE IF:   You are unable to keep fluids down.  You develop vomiting or diarrhea.  You are not feeling at least partly better after 3 days.  You develop new symptoms or problems. SEEK IMMEDIATE MEDICAL CARE IF:   You have shortness of breath or trouble breathing.  You develop excessive weakness.  You are dizzy or you faint.  You are extremely thirsty or you are making little or no urine.  You develop new pain that was not there before (such as in the head, neck, chest, back, or abdomen).  You have persistent vomiting and diarrhea for more than 1 to 2 days.  You develop a stiff neck or your eyes become sensitive to light.  You develop a skin rash.  You have a fever or persistent symptoms for more than 2 to 3 days.  You have a fever and your symptoms suddenly get worse. MAKE SURE YOU:   Understand these instructions.  Will watch your condition.  Will get help right away if you are not doing well or get worse. Document Released: 12/01/2000 Document Revised: 10/22/2013 Document Reviewed: 04/08/2011 [facility] Patient Information 2015 [place], [place]. This information is not intended to replace advice given to you by your health care provider. Make sure you discuss any questions you have with your health care provider.  Cough, Adult  A cough is a reflex that helps clear your throat and airways. It can help heal the body or may be a reaction to an irritated airway. A cough may only last 2 or 3 weeks (acute) or may last more than 8 weeks (chronic).  CAUSES Acute cough:  Viral or bacterial  infections. Chronic cough:  Infections.  Allergies.  Asthma.  Post-nasal drip.  Smoking.  Heartburn or acid reflux.  Some medicines.  Chronic lung problems (COPD).  Cancer. SYMPTOMS   Cough.  Fever.  Chest pain.  Increased breathing rate.  High-pitched whistling sound when breathing (wheezing).  Colored mucus that you cough up (sputum). TREATMENT   A bacterial cough may be treated with antibiotic medicine.  A viral cough must run its course and will not respond to antibiotics.  Your caregiver may recommend other treatments if you have a chronic cough. HOME CARE INSTRUCTIONS   Only take over-the-counter or prescription medicines for pain, discomfort, or fever as directed by your caregiver. Use cough suppressants only as directed by your caregiver.  Use a cold steam vaporizer or humidifier in your bedroom or home to help loosen secretions.  Sleep in a semi-upright position if your cough is worse at night.  Rest as needed.  Stop smoking if you smoke. SEEK IMMEDIATE MEDICAL CARE IF:   You have pus in your sputum.  Your cough starts to worsen.  You cannot control your cough with suppressants and are losing sleep.  You begin coughing up blood.  You have difficulty breathing.  You develop pain which is getting worse  or is uncontrolled with medicine.  You have a fever. MAKE SURE YOU:   Understand these instructions.  Will watch your condition.  Will get help right away if you are not doing well or get worse. Document Released: 12/04/2010 Document Revised: 08/30/2011 Document Reviewed: 12/04/2010 [facility] Patient Information 2015 [place], [place]. This information is not intended to replace advice given to you by your health care provider. Make sure you discuss any questions you have with your health care provider.

## 2014-04-07 NOTE — ED Notes (Signed)
Pt from home c/o abdominal pain since Oct7. Had CT showed large amount of fecal matter which was resolved with miralax but abdominal pain persists. She reports a nonproductive cough as well.

## 2014-04-07 NOTE — ED Provider Notes (Signed)
Patient presented to the ER with abdominal pain. Patient with symptoms for more than a week. When she primary doctor, had CT scan that showed constipation. Has been treated with MiraLax without improvement.  Face to face Exam: HEENT - PERRLA Lungs - CTAB Heart - RRR, no M/R/G Abd - mildly distended, decreased bowel sounds. Diffuse tenderness without guarding or rebound. Neuro - alert, oriented x3  Plan: Blood work with leukocytosis, slightly elevated alkaline phosphatase. Reviewed his CT scan, no acute pathology other than constipation seen. Will perform x-ray and ultrasound.   Orpah Greek, MD 04/07/14 [phone]

## 2014-04-07 NOTE — ED Notes (Signed)
Pt given sandwich and drink per PA Abigail

## 2014-04-07 NOTE — ED Notes (Addendum)
Ultrasound at bedside, procedure in progress.

## 2014-04-09 ENCOUNTER — Ambulatory Visit (INDEPENDENT_AMBULATORY_CARE_PROVIDER_SITE_OTHER): Payer: Medicare HMO | Admitting: Family Medicine

## 2014-04-09 ENCOUNTER — Inpatient Hospital Stay (HOSPITAL_COMMUNITY)
Admission: EM | Admit: 2014-04-09 | Discharge: 2014-04-17 | DRG: 871 | Disposition: A | Discharge status: Home / Self Care | Payer: Medicare HMO | Attending: Internal Medicine | Admitting: Internal Medicine

## 2014-04-09 ENCOUNTER — Ambulatory Visit (INDEPENDENT_AMBULATORY_CARE_PROVIDER_SITE_OTHER): Payer: Medicare HMO

## 2014-04-09 ENCOUNTER — Encounter (HOSPITAL_COMMUNITY): Payer: Self-pay | Admitting: Emergency Medicine

## 2014-04-09 ENCOUNTER — Emergency Department (HOSPITAL_COMMUNITY): Payer: Medicare HMO

## 2014-04-09 VITALS — BP 136/64 | HR 87 | Temp 102.1°F | Resp 18 | Ht 63.75 in | Wt 197.4 lb

## 2014-04-09 DIAGNOSIS — R531 Weakness: Secondary | ICD-10-CM | POA: Diagnosis present

## 2014-04-09 DIAGNOSIS — A419 Sepsis, unspecified organism: Secondary | ICD-10-CM | POA: Diagnosis present

## 2014-04-09 DIAGNOSIS — E876 Hypokalemia: Secondary | ICD-10-CM | POA: Diagnosis present

## 2014-04-09 DIAGNOSIS — I10 Essential (primary) hypertension: Secondary | ICD-10-CM | POA: Diagnosis present

## 2014-04-09 DIAGNOSIS — B9620 Unspecified Escherichia coli [E. coli] as the cause of diseases classified elsewhere: Secondary | ICD-10-CM | POA: Diagnosis present

## 2014-04-09 DIAGNOSIS — E782 Mixed hyperlipidemia: Secondary | ICD-10-CM

## 2014-04-09 DIAGNOSIS — Z23 Encounter for immunization: Secondary | ICD-10-CM | POA: Diagnosis not present

## 2014-04-09 DIAGNOSIS — E119 Type 2 diabetes mellitus without complications: Secondary | ICD-10-CM | POA: Diagnosis present

## 2014-04-09 DIAGNOSIS — I493 Ventricular premature depolarization: Secondary | ICD-10-CM

## 2014-04-09 DIAGNOSIS — R1031 Right lower quadrant pain: Secondary | ICD-10-CM

## 2014-04-09 DIAGNOSIS — Z885 Allergy status to narcotic agent status: Secondary | ICD-10-CM

## 2014-04-09 DIAGNOSIS — Z79899 Other long term (current) drug therapy: Secondary | ICD-10-CM | POA: Diagnosis not present

## 2014-04-09 DIAGNOSIS — Z79891 Long term (current) use of opiate analgesic: Secondary | ICD-10-CM

## 2014-04-09 DIAGNOSIS — A4151 Sepsis due to Escherichia coli [E. coli]: Secondary | ICD-10-CM

## 2014-04-09 DIAGNOSIS — R059 Cough, unspecified: Secondary | ICD-10-CM

## 2014-04-09 DIAGNOSIS — K5720 Diverticulitis of large intestine with perforation and abscess without bleeding: Secondary | ICD-10-CM

## 2014-04-09 DIAGNOSIS — E669 Obesity, unspecified: Secondary | ICD-10-CM

## 2014-04-09 DIAGNOSIS — Z833 Family history of diabetes mellitus: Secondary | ICD-10-CM

## 2014-04-09 DIAGNOSIS — R05 Cough: Secondary | ICD-10-CM

## 2014-04-09 DIAGNOSIS — Z791 Long term (current) use of non-steroidal anti-inflammatories (NSAID): Secondary | ICD-10-CM | POA: Diagnosis not present

## 2014-04-09 DIAGNOSIS — E1169 Type 2 diabetes mellitus with other specified complication: Secondary | ICD-10-CM

## 2014-04-09 DIAGNOSIS — R911 Solitary pulmonary nodule: Secondary | ICD-10-CM

## 2014-04-09 DIAGNOSIS — K353 Acute appendicitis with localized peritonitis: Secondary | ICD-10-CM | POA: Diagnosis present

## 2014-04-09 DIAGNOSIS — D638 Anemia in other chronic diseases classified elsewhere: Secondary | ICD-10-CM | POA: Diagnosis present

## 2014-04-09 DIAGNOSIS — E1165 Type 2 diabetes mellitus with hyperglycemia: Secondary | ICD-10-CM

## 2014-04-09 DIAGNOSIS — M810 Age-related osteoporosis without current pathological fracture: Secondary | ICD-10-CM | POA: Diagnosis present

## 2014-04-09 DIAGNOSIS — R001 Bradycardia, unspecified: Secondary | ICD-10-CM

## 2014-04-09 DIAGNOSIS — R42 Dizziness and giddiness: Secondary | ICD-10-CM

## 2014-04-09 DIAGNOSIS — K651 Peritoneal abscess: Secondary | ICD-10-CM | POA: Diagnosis present

## 2014-04-09 DIAGNOSIS — K5700 Diverticulitis of small intestine with perforation and abscess without bleeding: Secondary | ICD-10-CM

## 2014-04-09 DIAGNOSIS — N739 Female pelvic inflammatory disease, unspecified: Secondary | ICD-10-CM

## 2014-04-09 LAB — POCT URINALYSIS DIPSTICK
Glucose, UA: NEGATIVE
Nitrite, UA: NEGATIVE
PH UA: 5.5
PROTEIN UA: 100
SPEC GRAV UA: 1.02
Urobilinogen, UA: >=8

## 2014-04-09 LAB — URINALYSIS, ROUTINE W REFLEX MICROSCOPIC
Bilirubin Urine: NEGATIVE
GLUCOSE, UA: NEGATIVE mg/dL
KETONES UR: NEGATIVE mg/dL
Nitrite: NEGATIVE
PH: 6 (ref 5.0–8.0)
Protein, ur: NEGATIVE mg/dL
Specific Gravity, Urine: 1.01 (ref 1.005–1.030)
Urobilinogen, UA: 2 mg/dL — ABNORMAL HIGH (ref 0.0–1.0)

## 2014-04-09 LAB — COMPREHENSIVE METABOLIC PANEL
ALK PHOS: 184 U/L — AB (ref 39–117)
ALT: 24 U/L (ref 0–35)
AST: 26 U/L (ref 0–37)
Albumin: 2.2 g/dL — ABNORMAL LOW (ref 3.5–5.2)
Anion gap: 15 (ref 5–15)
BUN: 7 mg/dL (ref 6–23)
CO2: 22 mEq/L (ref 19–32)
Calcium: 8.5 mg/dL (ref 8.4–10.5)
Chloride: 100 mEq/L (ref 96–112)
Creatinine, Ser: 0.88 mg/dL (ref 0.50–1.10)
GFR calc non Af Amer: 63 mL/min — ABNORMAL LOW (ref >90)
GFR, EST AFRICAN AMERICAN: 73 mL/min — AB (ref >90)
GLUCOSE: 115 mg/dL — AB (ref 70–99)
Potassium: 3.8 mEq/L (ref 3.7–5.3)
Sodium: 137 mEq/L (ref 137–147)
TOTAL PROTEIN: 7 g/dL (ref 6.0–8.3)
Total Bilirubin: 0.6 mg/dL (ref 0.3–1.2)

## 2014-04-09 LAB — CBC WITH DIFFERENTIAL/PLATELET
Basophils Absolute: 0 10*3/uL (ref 0.0–0.1)
Basophils Relative: 0 % (ref 0–1)
EOS ABS: 0 10*3/uL (ref 0.0–0.7)
EOS PCT: 0 % (ref 0–5)
HCT: 32.6 % — ABNORMAL LOW (ref 36.0–46.0)
Hemoglobin: 10.7 g/dL — ABNORMAL LOW (ref 12.0–15.0)
Lymphocytes Relative: 10 % — ABNORMAL LOW (ref 12–46)
Lymphs Abs: 1.2 10*3/uL (ref 0.7–4.0)
MCH: 27.4 pg (ref 26.0–34.0)
MCHC: 32.8 g/dL (ref 30.0–36.0)
MCV: 83.6 fL (ref 78.0–100.0)
MONOS PCT: 8 % (ref 3–12)
Monocytes Absolute: 0.9 10*3/uL (ref 0.1–1.0)
Neutro Abs: 9.5 10*3/uL — ABNORMAL HIGH (ref 1.7–7.7)
Neutrophils Relative %: 82 % — ABNORMAL HIGH (ref 43–77)
Platelets: 285 10*3/uL (ref 150–400)
RBC: 3.9 MIL/uL (ref 3.87–5.11)
RDW: 14.3 % (ref 11.5–15.5)
WBC: 11.6 10*3/uL — ABNORMAL HIGH (ref 4.0–10.5)

## 2014-04-09 LAB — POCT CBC
Granulocyte percent: 81.4 %G — AB (ref 37–80)
HEMATOCRIT: 34.8 % — AB (ref 37.7–47.9)
HEMOGLOBIN: 11.1 g/dL — AB (ref 12.2–16.2)
LYMPH, POC: 1.9 (ref 0.6–3.4)
MCH: 26.9 pg — AB (ref 27–31.2)
MCHC: 31.8 g/dL (ref 31.8–35.4)
MCV: 84.6 fL (ref 80–97)
MID (cbc): 0.7 (ref 0–0.9)
MPV: 8.5 fL (ref 0–99.8)
POC Granulocyte: 11.6 — AB (ref 2–6.9)
POC LYMPH PERCENT: 13.6 %L (ref 10–50)
POC MID %: 5 % (ref 0–12)
Platelet Count, POC: 306 10*3/uL (ref 142–424)
RBC: 4.11 M/uL (ref 4.04–5.48)
RDW, POC: 15.2 %
WBC: 14.3 10*3/uL — AB (ref 4.6–10.2)

## 2014-04-09 LAB — URINE CULTURE

## 2014-04-09 LAB — POCT UA - MICROSCOPIC ONLY
Casts, Ur, LPF, POC: NEGATIVE
Crystals, Ur, HPF, POC: NEGATIVE
Yeast, UA: NEGATIVE

## 2014-04-09 LAB — POCT INFLUENZA A/B
Influenza A, POC: NEGATIVE
Influenza B, POC: NEGATIVE

## 2014-04-09 LAB — URINE MICROSCOPIC-ADD ON

## 2014-04-09 LAB — GLUCOSE, POCT (MANUAL RESULT ENTRY): POC Glucose: 137 mg/dl — AB (ref 70–99)

## 2014-04-09 LAB — POCT GLYCOSYLATED HEMOGLOBIN (HGB A1C): Hemoglobin A1C: 7.1

## 2014-04-09 LAB — I-STAT CG4 LACTIC ACID, ED: Lactic Acid, Venous: 0.65 mmol/L (ref 0.5–2.2)

## 2014-04-09 MED ORDER — ACETAMINOPHEN 325 MG PO TABS
650.0000 mg | ORAL_TABLET | Freq: Four times a day (QID) | ORAL | Status: DC | PRN
  Administered 2014-04-09 – 2014-04-11 (×4): 650 mg via ORAL
  Filled 2014-04-09 (×4): qty 2

## 2014-04-09 MED ORDER — VANCOMYCIN HCL IN DEXTROSE 1-5 GM/200ML-% IV SOLN
1000.0000 mg | Freq: Once | INTRAVENOUS | Status: AC
  Administered 2014-04-10: 1000 mg via INTRAVENOUS
  Filled 2014-04-09: qty 200

## 2014-04-09 MED ORDER — IOHEXOL 300 MG/ML  SOLN
50.0000 mL | Freq: Once | INTRAMUSCULAR | Status: AC | PRN
  Administered 2014-04-09: 50 mL via ORAL

## 2014-04-09 MED ORDER — PIPERACILLIN-TAZOBACTAM 3.375 G IVPB 30 MIN
3.3750 g | Freq: Once | INTRAVENOUS | Status: AC
  Administered 2014-04-09: 3.375 g via INTRAVENOUS
  Filled 2014-04-09: qty 50

## 2014-04-09 MED ORDER — IOHEXOL 300 MG/ML  SOLN
100.0000 mL | Freq: Once | INTRAMUSCULAR | Status: AC | PRN
  Administered 2014-04-09: 100 mL via INTRAVENOUS

## 2014-04-09 MED ORDER — HEPARIN SODIUM (PORCINE) 5000 UNIT/ML IJ SOLN
5000.0000 [IU] | Freq: Three times a day (TID) | INTRAMUSCULAR | Status: DC
  Administered 2014-04-10 – 2014-04-17 (×23): 5000 [IU] via SUBCUTANEOUS
  Filled 2014-04-09 (×26): qty 1

## 2014-04-09 MED ORDER — INSULIN ASPART 100 UNIT/ML ~~LOC~~ SOLN
0.0000 [IU] | SUBCUTANEOUS | Status: DC
  Administered 2014-04-10: 1 [IU] via SUBCUTANEOUS
  Administered 2014-04-10: 2 [IU] via SUBCUTANEOUS
  Administered 2014-04-10: 1 [IU] via SUBCUTANEOUS
  Administered 2014-04-11: 3 [IU] via SUBCUTANEOUS
  Administered 2014-04-11 (×2): 1 [IU] via SUBCUTANEOUS
  Administered 2014-04-11 – 2014-04-12 (×3): 2 [IU] via SUBCUTANEOUS

## 2014-04-09 MED ORDER — SODIUM CHLORIDE 0.9 % IV SOLN
INTRAVENOUS | Status: DC
  Administered 2014-04-10 – 2014-04-15 (×6): via INTRAVENOUS

## 2014-04-09 MED ORDER — SODIUM CHLORIDE 0.9 % IV BOLUS (SEPSIS)
1000.0000 mL | Freq: Once | INTRAVENOUS | Status: AC
  Administered 2014-04-09: 1000 mL via INTRAVENOUS

## 2014-04-09 NOTE — ED Notes (Signed)
Pt being sent by Dr Kriste Basque. Pt was seen recently in ED for abdominal pain, had CT recently. Pt reports abdominal pain continued. Now in offie pt has fever and elevated WBC. Dr Kriste Basque thinks pt needs another CT scan.

## 2014-04-09 NOTE — Progress Notes (Signed)
[facility] [address] [place] [address] [phone]  Date:  04/09/2014   Name:  Catherine Joseph   DOB:  [birth year]   MRN:  [id number]  PCP:  Kennon Portela, MD    Chief Complaint: Dizziness, Abdominal Pain and Cough   History of Present Illness:  Catherine Joseph is a 73 y.o. very pleasant female patient who presents with the following:  She is here today because "I don't feel good at all."  She reports she has felt bad since she was here on the 7th of this month.  She had a CT scan for abdominal pain that was negative and her pain seemed to get better temporarily with treatment for constipation.  However her pain returned and she has noted abdominal pain again for a week.  She did use miralax a few times for her constipation and has noted loose stools since.    She notes that she has a cough, her mouth "is sour", she can't eat, she feels weak.   She has had the cough for a week.    She was in the ER on 10/18 for evaluation of abdominal pain.  At that time she had a fever and leukocytosis.  An abdominal ultrasound was done which was normal She thinks that she "has pneumonia."    Last a1c was 7.4% in June.    Patient Active Problem List   Diagnosis Date Noted  . Chest pain 02/11/2014  . 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 Lung nodule 02/01/2012  . Mixed hyperlipidemia 08/03/2011  . Bradycardia 08/03/2011  . Dizzy spells 08/03/2011  . Hypertension 07/22/2011  . Diabetes mellitus, type II 07/22/2011    Past Medical History  Diagnosis Date  . Hypertension   . Diabetes mellitus type II   . Syncope   . Bradycardia   . Weakness   . Cataract   . Osteoporosis     Past Surgical History  Procedure Laterality Date  . Eye surgery    . Cesarean section    . Abdominal hysterectomy      History  Substance Use Topics  . Smoking status: Never Smoker   . Smokeless tobacco: Not on file  . Alcohol Use: No    Family History   Problem Relation Age of Onset  . Diabetes Sister     Allergies  Allergen Reactions  . Codeine Other (See Comments)    vertigo    Medication list has been reviewed and updated.  Current Outpatient Prescriptions on File Prior to Visit  Medication Sig Dispense Refill  . flecainide (TAMBOCOR) 50 MG tablet Take 1 tablet (50 mg total) by mouth 2 (two) times daily.  60 tablet  11  . glipiZIDE (GLUCOTROL) 5 MG tablet Take 5 mg by mouth daily before breakfast.      . simvastatin (ZOCOR) 20 MG tablet Take 20 mg by mouth daily.      . valsartan-hydrochlorothiazide (DIOVAN-HCT) 160-25 MG per tablet Take 1 tablet by mouth daily.      Marland Kitchen dicyclomine (BENTYL) 20 MG tablet Take 1 tablet (20 mg total) by mouth 4 (four) times daily -  before meals and at bedtime.  30 tablet  1  . ondansetron (ZOFRAN) 4 MG tablet Take 1 tablet (4 mg total) by mouth every 8 (eight) hours as needed for nausea or vomiting.  10 tablet  0  . traMADol (ULTRAM) 50 MG tablet Take 1 tablet (50 mg total) by mouth every  6 (six) hours as needed.  15 tablet  0   No current facility-administered medications on file prior to visit.    Review of Systems:  As per HPI- otherwise negative.   Physical Examination: Filed Vitals:   04/09/14 1527  BP: 136/64  Pulse: 87  Temp: 102.1 F (38.9 C)  Resp: 18   Filed Vitals:   04/09/14 1527  Height: 5' 3.75" (1.619 m)  Weight: 197 lb 6.4 oz (89.54 kg)   Body mass index is 34.16 kg/(m^2). Ideal Body Weight: Weight in (lb) to have BMI = 25: 144.2  GEN: WDWN, NAD, Non-toxic, A & O x 3, obese, poor dentition. History is somewhat disjointed.   HEENT: Atraumatic, Normocephalic. Neck supple. No masses, No LAD. Ears and Nose: No external deformity. CV: RRR, No M/G/R. No JVD. No thrill. No extra heart sounds. PULM: CTA B, no wheezes, crackles, rhonchi. No retractions. No resp. distress. No accessory muscle use. ABD: S,distended,+BS. No rebound. No HSM.  She is tender all over her  abdomen, more in the RLQ.  This tenderness is not consistent on re-exam  EXTR: No c/c/e NEURO Normal gait.  PSYCH: Normally interactive. Conversant. Not depressed or anxious appearing.  Calm demeanor.   [facility] reading (PRIMARY) by  Dr. Lorelei Pont. CXR: stable cardiomegaly. No definite infiltrate CLINICAL DATA: Cough for 2 weeks.  EXAM: CHEST 2 VIEW  COMPARISON: Single view of the chest 04/07/2014 and 03/27/2014. CT chest 02/26/2013.  FINDINGS: There is cardiomegaly without edema. No pneumothorax or pleural effusion.  IMPRESSION: Cardiomegaly without acute disease.  Results for orders placed in visit on 04/09/14  POCT CBC      Result Value Ref Range   WBC 14.3 (*) 4.6 - 10.2 K/uL   Lymph, poc 1.9  0.6 - 3.4   POC LYMPH PERCENT 13.6  10 - 50 %L   MID (cbc) 0.7  0 - 0.9   POC MID % 5.0  0 - 12 %M   POC Granulocyte 11.6 (*) 2 - 6.9   Granulocyte percent 81.4 (*) 37 - 80 %G   RBC 4.11  4.04 - 5.48 M/uL   Hemoglobin 11.1 (*) 12.2 - 16.2 g/dL   HCT, POC 34.8 (*) 37.7 - 47.9 %   MCV 84.6  80 - 97 fL   MCH, POC 26.9 (*) 27 - 31.2 pg   MCHC 31.8  31.8 - 35.4 g/dL   RDW, POC 15.2     Platelet Count, POC 306  142 - 424 K/uL   MPV 8.5  0 - 99.8 fL  GLUCOSE, POCT (MANUAL RESULT ENTRY)      Result Value Ref Range   POC Glucose 137 (*) 70 - 99 mg/dl  POCT GLYCOSYLATED HEMOGLOBIN (HGB A1C)      Result Value Ref Range   Hemoglobin A1C 7.1    POCT UA - MICROSCOPIC ONLY      Result Value Ref Range   WBC, Ur, HPF, POC 6-10     RBC, urine, microscopic 2-8     Bacteria, U Microscopic trace     Mucus, UA large     Epithelial cells, urine per micros 0-3     Crystals, Ur, HPF, POC neg     Casts, Ur, LPF, POC neg     Yeast, UA neg    POCT URINALYSIS DIPSTICK      Result Value Ref Range   Color, UA red     Clarity, UA clear     Glucose, UA neg  Bilirubin, UA moderate     Ketones, UA trace     Spec Grav, UA 1.020     Blood, UA moderate     pH, UA 5.5     Protein, UA 100      Urobilinogen, UA >=8.0     Nitrite, UA neg     Leukocytes, UA Trace    POCT INFLUENZA A/B      Result Value Ref Range   Influenza A, POC Negative     Influenza B, POC Negative       Assessment and Plan: Right lower quadrant abdominal pain - Plan: POCT CBC, POCT UA - Microscopic Only, POCT urinalysis dipstick, Comprehensive metabolic panel, Urine culture  Cough - Plan: DG Chest 2 View  Diabetes mellitus type 2 in obese - Plan: POCT glucose (manual entry), POCT glycosylated hemoglobin (Hb A1C)  Here today with persistent somewhat vague malaise.  However she also now has a true fever over the last several days. Today over 102.  She has complaint of abdominal pain and cough. I do not see pneumonia, will send her urine for a culture but she does not appear to have a significant UTI.  Discussed with her daughter- I would like them to take her back to the ER as I am concerned there is some other abdominal pathology.    Signed Lamar Blinks, MD

## 2014-04-09 NOTE — ED Provider Notes (Signed)
CSN: [id number]     Arrival date & time 04/09/14  1857 History   First MD Initiated Contact with Patient 04/09/14 2017     Chief Complaint  Patient presents with  . Abdominal Pain  . Cough  . Fever     (Consider location/radiation/quality/duration/timing/severity/associated sxs/prior Treatment) The history is provided by the patient.  LORELEI HEIKKILA is a 73 y.o. female hx of HTN, DM, here presenting with fever abdominal pain. Has been having intermittent abdominal pain since October 7. Had a CT that showed constipation so was started on MiraLax subsequently had diarrhea. Had fever for the last 3 days. Fever 102-103 daily. Also has some nonproductive cough. As well as diffuse abdominal pain. Came in 2 days ago and had WBC count 14. Had chest x-ray that was unremarkable and thought to have URI. She went to see PCP today and was again febrile and WBC 14 so sent in for another evaluation. In the office, repeat chest x-ray was unremarkable and flu neg. UA ? UTI.    Past Medical History  Diagnosis Date  . Hypertension   . Diabetes mellitus type II   . Syncope   . Bradycardia   . Weakness   . Cataract   . Osteoporosis    Past Surgical History  Procedure Laterality Date  . Eye surgery    . Cesarean section    . Abdominal hysterectomy     Family History  Problem Relation Age of Onset  . Diabetes Sister    History  Substance Use Topics  . Smoking status: Never Smoker   . Smokeless tobacco: Not on file  . Alcohol Use: No   OB History   Grav Para Term Preterm Abortions TAB SAB Ect Mult Living                 Review of Systems  Constitutional: Positive for fever.  Respiratory: Positive for cough.   Gastrointestinal: Positive for abdominal pain.  All other systems reviewed and are negative.     Allergies  Codeine  Home Medications   Prior to Admission medications   Medication Sig Start Date End Date Taking? Authorizing Provider  flecainide (TAMBOCOR) 50 MG  tablet Take 1 tablet (50 mg total) by mouth 2 (two) times daily. 04/05/14  Yes Josue Hector, MD  glipiZIDE (GLUCOTROL) 5 MG tablet Take 5 mg by mouth daily before breakfast.   Yes Historical Provider, MD  ibuprofen (ADVIL,MOTRIN) 200 MG tablet Take 400 mg by mouth every 6 (six) hours as needed for moderate pain.   Yes Historical Provider, MD  simvastatin (ZOCOR) 20 MG tablet Take 20 mg by mouth daily.   Yes Historical Provider, MD  valsartan-hydrochlorothiazide (DIOVAN-HCT) 160-25 MG per tablet Take 1 tablet by mouth daily.   Yes Historical Provider, MD  ondansetron (ZOFRAN) 4 MG tablet Take 1 tablet (4 mg total) by mouth every 8 (eight) hours as needed for nausea or vomiting. 04/07/14   Margarita Mail, PA-C  traMADol (ULTRAM) 50 MG tablet Take 1 tablet (50 mg total) by mouth every 6 (six) hours as needed. 04/07/14   Abigail Harris, PA-C   BP 131/62  Pulse 90  Temp(Src) 99.4 F (37.4 C) (Oral)  Resp 27  SpO2 97% Physical Exam  Nursing note and vitals reviewed. Constitutional: She is oriented to person, place, and time.  Uncomfortable   HENT:  Head: Normocephalic.  MM dry   Eyes: Conjunctivae are normal. Pupils are equal, round, and reactive to light.  Neck:  Normal range of motion. Neck supple.  Cardiovascular: Normal rate, regular rhythm and normal heart sounds.   Pulmonary/Chest: Effort normal and breath sounds normal. No respiratory distress. She has no wheezes. She has no rales.  Abdominal: Soft. Bowel sounds are normal.  Mild diffuse tenderness, worse in periumbilical area.   Musculoskeletal: Normal range of motion. She exhibits no edema and no tenderness.  Neurological: She is alert and oriented to person, place, and time. No cranial nerve deficit. Coordination normal.  Skin: Skin is warm and dry.  Psychiatric: She has a normal mood and affect. Her behavior is normal. Judgment and thought content normal.    ED Course  Procedures (including critical care time) Labs  Review Labs Reviewed  CBC WITH DIFFERENTIAL - Abnormal; Notable for the following:    WBC 11.6 (*)    Hemoglobin 10.7 (*)    HCT 32.6 (*)    Neutrophils Relative % 82 (*)    Neutro Abs 9.5 (*)    Lymphocytes Relative 10 (*)    All other components within normal limits  COMPREHENSIVE METABOLIC PANEL - Abnormal; Notable for the following:    Glucose, Bld 115 (*)    Albumin 2.2 (*)    Alkaline Phosphatase 184 (*)    GFR calc non Af Amer 63 (*)    GFR calc Af Amer 73 (*)    All other components within normal limits  URINALYSIS, ROUTINE W REFLEX MICROSCOPIC - Abnormal; Notable for the following:    Color, Urine AMBER (*)    Hgb urine dipstick MODERATE (*)    Urobilinogen, UA 2.0 (*)    Leukocytes, UA SMALL (*)    All other components within normal limits  URINE MICROSCOPIC-ADD ON - Abnormal; Notable for the following:    Bacteria, UA FEW (*)    All other components within normal limits  CULTURE, BLOOD (ROUTINE X 2)  CULTURE, BLOOD (ROUTINE X 2)  URINE CULTURE  I-STAT CG4 LACTIC ACID, ED    Imaging Review Dg Chest 2 View  04/09/2014   CLINICAL DATA:  Cough for 2 weeks.  EXAM: CHEST  2 VIEW  COMPARISON:  Single view of the chest 04/07/2014 and 03/27/2014. CT chest 02/26/2013.  FINDINGS: There is cardiomegaly without edema. No pneumothorax or pleural effusion.  IMPRESSION: Cardiomegaly without acute disease.   Electronically Signed   By: Inge Rise M.D.   On: 04/09/2014 18:04   Ct Abdomen Pelvis W Contrast  04/09/2014   CLINICAL DATA:  Progressive lower pelvic pain with nausea for 2 weeks. Fever.  EXAM: CT ABDOMEN AND PELVIS WITH CONTRAST  TECHNIQUE: Multidetector CT imaging of the abdomen and pelvis was performed using the standard protocol following bolus administration of intravenous contrast.  CONTRAST:  172mL OMNIPAQUE IOHEXOL 300 MG/ML SOLN, 23mL OMNIPAQUE IOHEXOL 300 MG/ML SOLN  COMPARISON:  03/27/2014  FINDINGS: Patchy airspace disease in the lung bases suggesting  infiltration or edema. Minimal bilateral pleural effusions.  The liver, spleen, gallbladder, pancreas, adrenal glands, kidneys, abdominal aorta, and inferior vena cava are unremarkable. There is inflammatory stranding in the retroperitoneum. Retroperitoneal lymph nodes are not pathologically enlarged and are likely reactive. Small accessory spleen. Portal veins appear patent. There are varices demonstrated in the left upper quadrant. Stomach, small bowel, and colon are not abnormally distended. Small umbilical hernia containing fat.  Pelvis: There is a large abscess in the anterior mid pelvis oriented slightly towards the right. The abscess measures 6.5 x 10.1 by 9.4 cm. There is extensive inflammatory stranding throughout  the pelvis and extending up into the retroperitoneum. Smaller adjacent loculated fluid collections are present in the periphery of the main abscess. There is inflammatory stranding and thickening involving the sigmoid colon. Likely this represents locally perforated diverticulitis with diverticular abscess. No free intraperitoneal air is noted. Small amount of free fluid in the pelvis is likely reactive. Uterus appears surgically absent. No abnormal adnexal masses. Appendix is not identified. Degenerative changes in the lumbar spine. No destructive bone lesions appreciated.  IMPRESSION: Large pelvic abscess with associated inflammatory stranding throughout the pelvis and extending into the right lower quadrant and retroperitoneum. Small amount of free fluid in the pelvis. No free intraperitoneal air. This likely results from diverticulitis with local perforation and abscess formation. Appendix is not identified and abnormal appendix is not excluded.   Electronically Signed   By: Lucienne Capers M.D.   On: 04/09/2014 22:23     EKG Interpretation None      MDM   Final diagnoses:  None   ALANNAH AVERHART is a 73 y.o. female here with ab pain, cough, fever. No meningeal signs. Lungs  clear and CXR today is clear. Some abdominal tenderness. Will get CT ab/pel.   10:46 PM  CT showed pelvic abscess likely from diverticulitis vs appendicitis. I called Dr. Harlow Asa, who recommend IV abx, medicine admission. Called Dr. Ernestina Patches, who will admit.      Wandra Arthurs, MD 04/09/14 2251

## 2014-04-09 NOTE — H&P (Addendum)
Hospitalist Admission History and Physical  Patient name: Catherine Joseph Medical record number: [id number] Date of birth: September 28, 1940 Age: 73 y.o. Gender: female  Primary Care Provider: Kennon Portela, MD  Chief Complaint: sepsis, intra-abdominal abscess   History of Present Illness:This is a 73 y.o. year old female with significant past medical history of HTN, NIDDM presenting with sepsis, intra-abdominal abscess. Pt states that she has had generalized malaise over the past week. Was initially seen at [facility] on 10/7. Had CT done that was negative for infectious process. Was started on miralax. Per pt, sxs seemed to progressively worsen over the past week. Presented back to [facility] with tmax aroun 103 and severe abd pain. Pt was redirected to ER. Pt denies any prior hx/o surgery in the past apart from c-section in [place].   On presentation, tmax 103, HR in 90s, BP 10s-20s, BP 110s-150s. Satting >96% on RA. WBC 14.5,Hgb 11, Cr 0.88, Glu 115.Lactate WNL. UA mildly indicative of infection. CT done that shows Large pelvic abscess with associated inflammatory stranding throughout the pelvis and extending into the right lower quadrant and retroperitoneum with likely diverticulitis with local perforation and abscess formation. CXR shows cardiomegaly without acute disease. EDP spoke with Dr. Harlow Asa with surgery who recommends IV abx. Will likely need perc drained placed. They will consult in am. Blood cultures drawn x2. Started on zosyn in ER.    Assessment and Plan: Catherine Joseph is a 73 y.o. year old female presenting with sepsis, intra-abdominal abscess   Active Problems:   Sepsis   Intra-abdominal abscess   1- Sepsis/intra-abdominal abscess -sepsis likely secondary to intra-abdominal abscess -Continue IV zosyn -add on vanc empirically  -exam fairly reassuring clinically- hemodynamically stable-NAD -tele bed overnight (stepdown full at [facility] and [facility]) -Cont to follow closely  -f/u  surg recs   2- NIDDM -SSI  -A1C  3- HTN -stable  -hold oral meds    FEN/GI: NPO  Prophylaxis: sub q heparin  Disposition: pending further evaluation  Code Status:Full Code    Patient Active Problem List   Diagnosis Date Noted  . Sepsis 04/09/2014  . Intra-abdominal abscess 04/09/2014  . Chest pain 02/11/2014  . 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 Lung nodule 02/01/2012  . Mixed hyperlipidemia 08/03/2011  . Bradycardia 08/03/2011  . Dizzy spells 08/03/2011  . Hypertension 07/22/2011  . Diabetes mellitus, type II 07/22/2011   Past Medical History: Past Medical History  Diagnosis Date  . Hypertension   . Diabetes mellitus type II   . Syncope   . Bradycardia   . Weakness   . Cataract   . Osteoporosis     Past Surgical History: Past Surgical History  Procedure Laterality Date  . Eye surgery    . Cesarean section    . Abdominal hysterectomy      Social History: History   Social History  . Marital Status: Widowed    Spouse Name: N/A    Number of Children: N/A  . Years of Education: N/A   Social History Main Topics  . Smoking status: Never Smoker   . Smokeless tobacco: None  . Alcohol Use: No  . Drug Use: No  . Sexual Activity: None   Other Topics Concern  . None   Social History Narrative  . None    Family History: Family History  Problem Relation Age of Onset  . Diabetes Sister     Allergies: Allergies  Allergen Reactions  . Codeine Other (See Comments)    vertigo  Current Facility-Administered Medications  Medication Dose Route Frequency Provider Last Rate Last Dose  . 0.9 %  sodium chloride infusion   Intravenous Continuous Shanda Howells, MD      . acetaminophen (TYLENOL) tablet 650 mg  650 mg Oral Q6H PRN Wandra Arthurs, MD   650 mg at 04/09/14 2102  . heparin injection 5,000 Units  5,000 Units Subcutaneous 3 times per day Shanda Howells, MD      . Derrill Memo ON 04/10/2014] insulin aspart (novoLOG) injection 0-9 Units  0-9 Units  Subcutaneous 6 times per day Shanda Howells, MD      . piperacillin-tazobactam (ZOSYN) IVPB 3.375 g  3.375 g Intravenous Once Wandra Arthurs, MD 100 mL/hr at 04/09/14 2304 3.375 g at 04/09/14 2304   Current Outpatient Prescriptions  Medication Sig Dispense Refill  . flecainide (TAMBOCOR) 50 MG tablet Take 1 tablet (50 mg total) by mouth 2 (two) times daily.  60 tablet  11  . glipiZIDE (GLUCOTROL) 5 MG tablet Take 5 mg by mouth daily before breakfast.      . ibuprofen (ADVIL,MOTRIN) 200 MG tablet Take 400 mg by mouth every 6 (six) hours as needed for moderate pain.      . simvastatin (ZOCOR) 20 MG tablet Take 20 mg by mouth daily.      . valsartan-hydrochlorothiazide (DIOVAN-HCT) 160-25 MG per tablet Take 1 tablet by mouth daily.      . ondansetron (ZOFRAN) 4 MG tablet Take 1 tablet (4 mg total) by mouth every 8 (eight) hours as needed for nausea or vomiting.  10 tablet  0  . traMADol (ULTRAM) 50 MG tablet Take 1 tablet (50 mg total) by mouth every 6 (six) hours as needed.  15 tablet  0   Review Of Systems: 12 point ROS negative except as noted above in HPI.  Physical Exam: Filed Vitals:   04/09/14 2254  BP: 117/50  Pulse:   Temp: 98.7 F (37.1 C)  Resp: 12    General: alert and cooperative HEENT: PERRLA and extra ocular movement intact Heart: S1, S2 normal, no murmur, rub or gallop, regular rate and rhythm Lungs: clear to auscultation, no wheezes or rales and unlabored breathing Abdomen: + bowel sounds, minimal abd TTP  Extremities: extremities normal, atraumatic, no cyanosis or edema Skin:no rashes, no ecchymoses Neurology: normal without focal findings  Labs and Imaging: Lab Results  Component Value Date/Time   NA 137 04/09/2014  8:46 PM   K 3.8 04/09/2014  8:46 PM   CL 100 04/09/2014  8:46 PM   CO2 22 04/09/2014  8:46 PM   BUN 7 04/09/2014  8:46 PM   CREATININE 0.88 04/09/2014  8:46 PM   CREATININE 0.90 08/15/2013  4:27 PM   GLUCOSE 115* 04/09/2014  8:46 PM   Lab Results   Component Value Date   WBC 11.6* 04/09/2014   HGB 10.7* 04/09/2014   HCT 32.6* 04/09/2014   MCV 83.6 04/09/2014   PLT 285 04/09/2014   Urinalysis    Component Value Date/Time   COLORURINE AMBER* 04/09/2014 2120   APPEARANCEUR CLEAR 04/09/2014 2120   LABSPEC 1.010 04/09/2014 2120   PHURINE 6.0 04/09/2014 2120   GLUCOSEU NEGATIVE 04/09/2014 2120   HGBUR MODERATE* 04/09/2014 2120   BILIRUBINUR NEGATIVE 04/09/2014 2120   BILIRUBINUR moderate 04/09/2014 1750   KETONESUR NEGATIVE 04/09/2014 2120   PROTEINUR NEGATIVE 04/09/2014 2120   PROTEINUR 100 04/09/2014 1750   UROBILINOGEN 2.0* 04/09/2014 2120   UROBILINOGEN >=8.0 04/09/2014 1750   NITRITE NEGATIVE  04/09/2014 2120   NITRITE neg 04/09/2014 1750   LEUKOCYTESUR SMALL* 04/09/2014 2120       Dg Chest 2 View  04/09/2014   CLINICAL DATA:  Cough for 2 weeks.  EXAM: CHEST  2 VIEW  COMPARISON:  Single view of the chest 04/07/2014 and 03/27/2014. CT chest 02/26/2013.  FINDINGS: There is cardiomegaly without edema. No pneumothorax or pleural effusion.  IMPRESSION: Cardiomegaly without acute disease.   Electronically Signed   By: Inge Rise M.D.   On: 04/09/2014 18:04   Ct Abdomen Pelvis W Contrast  04/09/2014   CLINICAL DATA:  Progressive lower pelvic pain with nausea for 2 weeks. Fever.  EXAM: CT ABDOMEN AND PELVIS WITH CONTRAST  TECHNIQUE: Multidetector CT imaging of the abdomen and pelvis was performed using the standard protocol following bolus administration of intravenous contrast.  CONTRAST:  168mL OMNIPAQUE IOHEXOL 300 MG/ML SOLN, 25mL OMNIPAQUE IOHEXOL 300 MG/ML SOLN  COMPARISON:  03/27/2014  FINDINGS: Patchy airspace disease in the lung bases suggesting infiltration or edema. Minimal bilateral pleural effusions.  The liver, spleen, gallbladder, pancreas, adrenal glands, kidneys, abdominal aorta, and inferior vena cava are unremarkable. There is inflammatory stranding in the retroperitoneum. Retroperitoneal lymph nodes are not  pathologically enlarged and are likely reactive. Small accessory spleen. Portal veins appear patent. There are varices demonstrated in the left upper quadrant. Stomach, small bowel, and colon are not abnormally distended. Small umbilical hernia containing fat.  Pelvis: There is a large abscess in the anterior mid pelvis oriented slightly towards the right. The abscess measures 6.5 x 10.1 by 9.4 cm. There is extensive inflammatory stranding throughout the pelvis and extending up into the retroperitoneum. Smaller adjacent loculated fluid collections are present in the periphery of the main abscess. There is inflammatory stranding and thickening involving the sigmoid colon. Likely this represents locally perforated diverticulitis with diverticular abscess. No free intraperitoneal air is noted. Small amount of free fluid in the pelvis is likely reactive. Uterus appears surgically absent. No abnormal adnexal masses. Appendix is not identified. Degenerative changes in the lumbar spine. No destructive bone lesions appreciated.  IMPRESSION: Large pelvic abscess with associated inflammatory stranding throughout the pelvis and extending into the right lower quadrant and retroperitoneum. Small amount of free fluid in the pelvis. No free intraperitoneal air. This likely results from diverticulitis with local perforation and abscess formation. Appendix is not identified and abnormal appendix is not excluded.   Electronically Signed   By: Lucienne Capers M.D.   On: 04/09/2014 22:23           Shanda Howells MD  Pager: [phone]

## 2014-04-10 ENCOUNTER — Encounter (HOSPITAL_COMMUNITY): Payer: Self-pay | Admitting: General Surgery

## 2014-04-10 ENCOUNTER — Inpatient Hospital Stay (HOSPITAL_COMMUNITY): Payer: Medicare HMO

## 2014-04-10 ENCOUNTER — Ambulatory Visit: Payer: Medicare HMO | Admitting: Cardiovascular Disease

## 2014-04-10 DIAGNOSIS — D638 Anemia in other chronic diseases classified elsewhere: Secondary | ICD-10-CM | POA: Diagnosis present

## 2014-04-10 DIAGNOSIS — N732 Unspecified parametritis and pelvic cellulitis: Secondary | ICD-10-CM

## 2014-04-10 LAB — COMPREHENSIVE METABOLIC PANEL
ALBUMIN: 2.2 g/dL — AB (ref 3.5–5.2)
ALT: 23 U/L (ref 0–35)
ALT: 21 U/L (ref 0–35)
ANION GAP: 12 (ref 5–15)
AST: 25 U/L (ref 0–37)
AST: 23 U/L (ref 0–37)
Albumin: 2.8 g/dL — ABNORMAL LOW (ref 3.5–5.2)
Alkaline Phosphatase: 189 U/L — ABNORMAL HIGH (ref 39–117)
Alkaline Phosphatase: 170 U/L — ABNORMAL HIGH (ref 39–117)
BUN: 8 mg/dL (ref 6–23)
BUN: 6 mg/dL (ref 6–23)
CALCIUM: 8.5 mg/dL (ref 8.4–10.5)
CHLORIDE: 100 meq/L (ref 96–112)
CO2: 27 meq/L (ref 19–32)
CO2: 26 mEq/L (ref 19–32)
CREATININE: 0.86 mg/dL (ref 0.50–1.10)
CREATININE: 0.9 mg/dL (ref 0.50–1.10)
Calcium: 8.4 mg/dL (ref 8.4–10.5)
Chloride: 101 mEq/L (ref 96–112)
GFR calc Af Amer: 71 mL/min — ABNORMAL LOW (ref >90)
GFR calc non Af Amer: 61 mL/min — ABNORMAL LOW (ref >90)
Glucose, Bld: 117 mg/dL — ABNORMAL HIGH (ref 70–99)
Glucose, Bld: 125 mg/dL — ABNORMAL HIGH (ref 70–99)
Potassium: 4.1 mEq/L (ref 3.5–5.3)
Potassium: 3.7 mEq/L (ref 3.7–5.3)
Sodium: 137 mEq/L (ref 135–145)
Sodium: 139 mEq/L (ref 137–147)
TOTAL PROTEIN: 6.7 g/dL (ref 6.0–8.3)
TOTAL PROTEIN: 6.8 g/dL (ref 6.0–8.3)
Total Bilirubin: 0.8 mg/dL (ref 0.2–1.2)
Total Bilirubin: 0.7 mg/dL (ref 0.3–1.2)

## 2014-04-10 LAB — CBC WITH DIFFERENTIAL/PLATELET
BASOS ABS: 0 10*3/uL (ref 0.0–0.1)
Basophils Relative: 0 % (ref 0–1)
Eosinophils Absolute: 0.1 10*3/uL (ref 0.0–0.7)
Eosinophils Relative: 1 % (ref 0–5)
HEMATOCRIT: 31.9 % — AB (ref 36.0–46.0)
HEMOGLOBIN: 10.3 g/dL — AB (ref 12.0–15.0)
LYMPHS ABS: 1.4 10*3/uL (ref 0.7–4.0)
Lymphocytes Relative: 12 % (ref 12–46)
MCH: 27.1 pg (ref 26.0–34.0)
MCHC: 32.3 g/dL (ref 30.0–36.0)
MCV: 83.9 fL (ref 78.0–100.0)
MONOS PCT: 6 % (ref 3–12)
Monocytes Absolute: 0.7 10*3/uL (ref 0.1–1.0)
NEUTROS ABS: 9.7 10*3/uL — AB (ref 1.7–7.7)
Neutrophils Relative %: 81 % — ABNORMAL HIGH (ref 43–77)
Platelets: 292 10*3/uL (ref 150–400)
RBC: 3.8 MIL/uL — AB (ref 3.87–5.11)
RDW: 14.5 % (ref 11.5–15.5)
WBC: 12 10*3/uL — AB (ref 4.0–10.5)

## 2014-04-10 LAB — GLUCOSE, CAPILLARY
GLUCOSE-CAPILLARY: 126 mg/dL — AB (ref 70–99)
GLUCOSE-CAPILLARY: 84 mg/dL (ref 70–99)
Glucose-Capillary: 108 mg/dL — ABNORMAL HIGH (ref 70–99)
Glucose-Capillary: 110 mg/dL — ABNORMAL HIGH (ref 70–99)
Glucose-Capillary: 140 mg/dL — ABNORMAL HIGH (ref 70–99)
Glucose-Capillary: 176 mg/dL — ABNORMAL HIGH (ref 70–99)

## 2014-04-10 LAB — URINE CULTURE
COLONY COUNT: NO GROWTH
Colony Count: 30000
ORGANISM ID, BACTERIA: NO GROWTH

## 2014-04-10 LAB — HEMOGLOBIN A1C
HEMOGLOBIN A1C: 7.6 % — AB (ref <5.7)
MEAN PLASMA GLUCOSE: 171 mg/dL — AB (ref <117)

## 2014-04-10 LAB — PROTIME-INR
INR: 1.23 (ref 0.00–1.49)
Prothrombin Time: 15.6 seconds — ABNORMAL HIGH (ref 11.6–15.2)

## 2014-04-10 MED ORDER — MIDAZOLAM HCL 2 MG/2ML IJ SOLN
INTRAMUSCULAR | Status: AC
  Filled 2014-04-10: qty 4

## 2014-04-10 MED ORDER — FENTANYL CITRATE 0.05 MG/ML IJ SOLN
INTRAMUSCULAR | Status: AC
  Filled 2014-04-10: qty 4

## 2014-04-10 MED ORDER — PIPERACILLIN-TAZOBACTAM 3.375 G IVPB
3.3750 g | Freq: Three times a day (TID) | INTRAVENOUS | Status: DC
  Administered 2014-04-10 – 2014-04-13 (×10): 3.375 g via INTRAVENOUS
  Filled 2014-04-10 (×11): qty 50

## 2014-04-10 MED ORDER — VANCOMYCIN HCL IN DEXTROSE 750-5 MG/150ML-% IV SOLN
750.0000 mg | Freq: Two times a day (BID) | INTRAVENOUS | Status: DC
  Administered 2014-04-10 – 2014-04-12 (×5): 750 mg via INTRAVENOUS
  Filled 2014-04-10 (×6): qty 150

## 2014-04-10 MED ORDER — GUAIFENESIN-DM 100-10 MG/5ML PO SYRP
5.0000 mL | ORAL_SOLUTION | Freq: Four times a day (QID) | ORAL | Status: DC | PRN
  Administered 2014-04-10 – 2014-04-12 (×4): 5 mL via ORAL
  Filled 2014-04-10 (×4): qty 10

## 2014-04-10 MED ORDER — INFLUENZA VAC SPLIT QUAD 0.5 ML IM SUSY
0.5000 mL | PREFILLED_SYRINGE | INTRAMUSCULAR | Status: AC
  Administered 2014-04-11: 0.5 mL via INTRAMUSCULAR
  Filled 2014-04-10 (×2): qty 0.5

## 2014-04-10 MED ORDER — FENTANYL CITRATE 0.05 MG/ML IJ SOLN
INTRAMUSCULAR | Status: AC | PRN
  Administered 2014-04-10 (×2): 50 ug via INTRAVENOUS

## 2014-04-10 MED ORDER — MIDAZOLAM HCL 2 MG/2ML IJ SOLN
INTRAMUSCULAR | Status: AC | PRN
  Administered 2014-04-10: 1 mg via INTRAVENOUS
  Administered 2014-04-10: 2 mg via INTRAVENOUS

## 2014-04-10 NOTE — Progress Notes (Signed)
ANTIBIOTIC CONSULT NOTE - INITIAL  Pharmacy Consult for Vancomycin and Zosyn  Indication: Sepsis  Allergies  Allergen Reactions  . Codeine Other (See Comments)    vertigo    Patient Measurements: Height: 5' 3.75" (161.9 cm) Weight: 197 lb (89.359 kg) IBW/kg (Calculated) : 54.13 Adjusted Body Weight:   Vital Signs: Temp: 98.8 F (37.1 C) (10/21 0504) Temp Source: Oral (10/21 0504) BP: 152/63 mmHg (10/21 0504) Pulse Rate: 69 (10/21 0504) Intake/Output from previous day: 10/20 0701 - 10/21 0700 In: 436.7 [I.V.:186.7; IV Piggyback:250] Out: -  Intake/Output from this shift: Total I/O In: 436.7 [I.V.:186.7; IV Piggyback:250] Out: -   Labs:  Recent Labs  04/07/14 1535 04/09/14 1745 04/09/14 2046  WBC 14.5* 14.3* 11.6*  HGB 12.4 11.1* 10.7*  PLT 296  --  285  CREATININE 0.91  --  0.88   Estimated Creatinine Clearance: 59.5 ml/min (by C-G formula based on Cr of 0.88). No results found for this basename: VANCOTROUGH, Corlis Leak, VANCORANDOM, GENTTROUGH, GENTPEAK, GENTRANDOM, TOBRATROUGH, TOBRAPEAK, TOBRARND, AMIKACINPEAK, AMIKACINTROU, AMIKACIN,  in the last 72 hours   Microbiology: Recent Results (from the past 720 hour(s))  URINE CULTURE     Status: None   Collection Time    04/07/14  3:26 PM      Result Value Ref Range Status   Specimen Description URINE, CLEAN CATCH   Final   Special Requests NONE   Final   Culture  Setup Time     Final   Value: 04/08/2014 08:54     Performed at [facility]     Final   Value: 20,OOO COLONIES/ML     Performed at [facility]   Culture     Final   Value: Multiple bacterial morphotypes present, none predominant. Suggest appropriate recollection if clinically indicated.     Performed at [facility]   Report Status 04/09/2014 FINAL   Final    Medical History: Past Medical History  Diagnosis Date  . Hypertension   . Diabetes mellitus type II   . Syncope   . Bradycardia   . Weakness    . Cataract   . Osteoporosis     Medications:  Anti-infectives   Start     Dose/Rate Route Frequency Ordered Stop   04/10/14 1000  vancomycin (VANCOCIN) IVPB 750 mg/150 ml premix     750 mg 150 mL/hr over 60 Minutes Intravenous Every 12 hours 04/10/14 0523     04/10/14 0600  piperacillin-tazobactam (ZOSYN) IVPB 3.375 g     3.375 g 12.5 mL/hr over 240 Minutes Intravenous 3 times per day 04/10/14 0523     04/09/14 2315  vancomycin (VANCOCIN) IVPB 1000 mg/200 mL premix     1,000 mg 200 mL/hr over 60 Minutes Intravenous  Once 04/09/14 2314 04/10/14 0155   04/09/14 2230  piperacillin-tazobactam (ZOSYN) IVPB 3.375 g     3.375 g 100 mL/hr over 30 Minutes Intravenous  Once 04/09/14 2227 04/09/14 2348     Assessment: Patient with sepsis, intra-abdominal abscess.  First dose of antibiotics already given.  Goal of Therapy:  Vancomycin trough level 15-20 mcg/ml Zosyn based on renal function   Plan:  Measure antibiotic drug levels at steady state Follow up culture results Vancomycin 750mg  iv q12hr Zosyn 3.375g IV Q8H infused over 4hrs.   Tyler Deis, Shea Stakes Crowford 04/10/2014,5:23 AM

## 2014-04-10 NOTE — Consult Note (Signed)
Reason for Consult: intra-abdominal abscess, sepsis Referring Physician: Dr. Leisa Lenz   HPI: Catherine Joseph is a 73 year old female with a history of DM, HTN, obesity who presented to [facility] with abdominal pain.  Duration of symptoms is 2 weeks.  Onset was sudden.  Coarse is improving.  Time pattern is intermittent.  Moderate in severity.  Aggravated by bowel movements.  Alleviating factors; none. Associated with fevers, chills and sweats.  She denies diarrhea, melena or hematochezia.  She was nauseated, but now has resolved.  Denies previous symptoms.  She reports having a colonoscopy at age 25 in [place], does not recall any abnormal findings.  She was initially seen by her primary care doctor and started on miralax which initially helped her symptoms.  At this time, she had a CT of abdomen and pelvis which showed sigmoid diverticulosis without diverticulitis and a small fat containing ventral hernia.  Her symptoms worsened and was seen in the ED on the 18th, an abdominal US was unremarkable.  She presented once again with ongoing symptoms.  A CT of abdomen and pelvis revealed a large pelvic abscess with stranding throughout the pelvis in the RLQ and retroperitoneum.  White count  14.3, then 11.6, now 12K.  Tmax 103, no tachycardia or hypotension.   We have been asked to evaluate the patient for the intra-abdominal abscesses.   Past Medical History  Diagnosis Date  . Hypertension   . Diabetes mellitus type II   . Syncope   . Bradycardia   . Weakness   . Cataract   . Osteoporosis     Past Surgical History  Procedure Laterality Date  . Eye surgery    . Cesarean section    . Abdominal hysterectomy      Family History  Problem Relation Age of Onset  . Diabetes Sister     Social History:  reports that she has never smoked. She does not have any smokeless tobacco history on file. She reports that she does not drink alcohol or use illicit drugs.  Allergies:  Allergies  Allergen  Reactions  . Codeine Other (See Comments)    vertigo    Medications:  Scheduled Meds: . heparin  5,000 Units Subcutaneous 3 times per day  . [START ON 04/11/2014] Influenza vac split quadrivalent PF  0.5 mL Intramuscular Tomorrow-1000  . insulin aspart  0-9 Units Subcutaneous 6 times per day  . piperacillin-tazobactam (ZOSYN)  IV  3.375 g Intravenous 3 times per day  . vancomycin  750 mg Intravenous Q12H   Continuous Infusions: . sodium chloride 100 mL/hr at 04/10/14 0008   PRN Meds:.acetaminophen, guaiFENesin-dextromethorphan   Results for orders placed during the hospital encounter of 04/09/14 (from the past 48 hour(s))  CBC WITH DIFFERENTIAL     Status: Abnormal   Collection Time    04/09/14  8:46 PM      Result Value Ref Range   WBC 11.6 (*) 4.0 - 10.5 K/uL   RBC 3.90  3.87 - 5.11 MIL/uL   Hemoglobin 10.7 (*) 12.0 - 15.0 g/dL   HCT 32.6 (*) 36.0 - 46.0 %   MCV 83.6  78.0 - 100.0 fL   MCH 27.4  26.0 - 34.0 pg   MCHC 32.8  30.0 - 36.0 g/dL   RDW 14.3  11.5 - 15.5 %   Platelets 285  150 - 400 K/uL   Neutrophils Relative % 82 (*) 43 - 77 %   Neutro Abs 9.5 (*) 1.7 - 7.7 K/uL  Lymphocytes Relative 10 (*) 12 - 46 %   Lymphs Abs 1.2  0.7 - 4.0 K/uL   Monocytes Relative 8  3 - 12 %   Monocytes Absolute 0.9  0.1 - 1.0 K/uL   Eosinophils Relative 0  0 - 5 %   Eosinophils Absolute 0.0  0.0 - 0.7 K/uL   Basophils Relative 0  0 - 1 %   Basophils Absolute 0.0  0.0 - 0.1 K/uL  COMPREHENSIVE METABOLIC PANEL     Status: Abnormal   Collection Time    04/09/14  8:46 PM      Result Value Ref Range   Sodium 137  137 - 147 mEq/L   Potassium 3.8  3.7 - 5.3 mEq/L   Chloride 100  96 - 112 mEq/L   CO2 22  19 - 32 mEq/L   Glucose, Bld 115 (*) 70 - 99 mg/dL   BUN 7  6 - 23 mg/dL   Creatinine, Ser 0.88  0.50 - 1.10 mg/dL   Calcium 8.5  8.4 - 10.5 mg/dL   Total Protein 7.0  6.0 - 8.3 g/dL   Albumin 2.2 (*) 3.5 - 5.2 g/dL   AST 26  0 - 37 U/L   ALT 24  0 - 35 U/L   Alkaline  Phosphatase 184 (*) 39 - 117 U/L   Total Bilirubin 0.6  0.3 - 1.2 mg/dL   GFR calc non Af Amer 63 (*) >90 mL/min   GFR calc Af Amer 73 (*) >90 mL/min   Comment: (NOTE)     The eGFR has been calculated using the CKD EPI equation.     This calculation has not been validated in all clinical situations.     eGFR's persistently <90 mL/min signify possible Chronic Kidney     Disease.   Anion gap 15  5 - 15  URINALYSIS, ROUTINE W REFLEX MICROSCOPIC     Status: Abnormal   Collection Time    04/09/14  9:20 PM      Result Value Ref Range   Color, Urine AMBER (*) YELLOW   Comment: BIOCHEMICALS MAY BE AFFECTED BY COLOR   APPearance CLEAR  CLEAR   Specific Gravity, Urine 1.010  1.005 - 1.030   pH 6.0  5.0 - 8.0   Glucose, UA NEGATIVE  NEGATIVE mg/dL   Hgb urine dipstick MODERATE (*) NEGATIVE   Bilirubin Urine NEGATIVE  NEGATIVE   Ketones, ur NEGATIVE  NEGATIVE mg/dL   Protein, ur NEGATIVE  NEGATIVE mg/dL   Urobilinogen, UA 2.0 (*) 0.0 - 1.0 mg/dL   Nitrite NEGATIVE  NEGATIVE   Leukocytes, UA SMALL (*) NEGATIVE  URINE MICROSCOPIC-ADD ON     Status: Abnormal   Collection Time    04/09/14  9:20 PM      Result Value Ref Range   Squamous Epithelial / LPF RARE  RARE   WBC, UA 7-10  <3 WBC/hpf   RBC / HPF 3-6  <3 RBC/hpf   Bacteria, UA FEW (*) RARE   Urine-Other MUCOUS PRESENT    I-STAT CG4 LACTIC ACID, ED     Status: None   Collection Time    04/09/14 10:25 PM      Result Value Ref Range   Lactic Acid, Venous 0.65  0.5 - 2.2 mmol/L  HEMOGLOBIN A1C     Status: Abnormal   Collection Time    04/09/14 11:12 PM      Result Value Ref Range   Hemoglobin A1C 7.6 (*) <5.7 %  Comment: (NOTE)                                                                               According to the ADA Clinical Practice Recommendations for 2011, when     HbA1c is used as a screening test:      >=6.5%   Diagnostic of Diabetes Mellitus               (if abnormal result is confirmed)     5.7-6.4%   Increased  risk of developing Diabetes Mellitus     References:Diagnosis and Classification of Diabetes Mellitus,Diabetes     CZYS,0630,16(WFUXN 1):S62-S69 and Standards of Medical Care in             Diabetes - 2011,Diabetes ATFT,7322,02 (Suppl 1):S11-S61.   Mean Plasma Glucose 171 (*) <117 mg/dL   Comment: Performed at [place], [facility]     Status: Abnormal   Collection Time    04/10/14 12:25 AM      Result Value Ref Range   Glucose-Capillary 110 (*) 70 - 99 mg/dL   Comment 1 Notify RN    GLUCOSE, CAPILLARY     Status: Abnormal   Collection Time    04/10/14  4:23 AM      Result Value Ref Range   Glucose-Capillary 108 (*) 70 - 99 mg/dL   Comment 1 Notify RN    COMPREHENSIVE METABOLIC PANEL     Status: Abnormal   Collection Time    04/10/14  5:30 AM      Result Value Ref Range   Sodium 139  137 - 147 mEq/L   Potassium 3.7  3.7 - 5.3 mEq/L   Chloride 101  96 - 112 mEq/L   CO2 26  19 - 32 mEq/L   Glucose, Bld 125 (*) 70 - 99 mg/dL   BUN 6  6 - 23 mg/dL   Creatinine, Ser 0.90  0.50 - 1.10 mg/dL   Calcium 8.4  8.4 - 10.5 mg/dL   Total Protein 6.8  6.0 - 8.3 g/dL   Albumin 2.2 (*) 3.5 - 5.2 g/dL   AST 23  0 - 37 U/L   ALT 21  0 - 35 U/L   Alkaline Phosphatase 170 (*) 39 - 117 U/L   Total Bilirubin 0.7  0.3 - 1.2 mg/dL   GFR calc non Af Amer 61 (*) >90 mL/min   GFR calc Af Amer 71 (*) >90 mL/min   Comment: (NOTE)     The eGFR has been calculated using the CKD EPI equation.     This calculation has not been validated in all clinical situations.     eGFR's persistently <90 mL/min signify possible Chronic Kidney     Disease.   Anion gap 12  5 - 15  CBC WITH DIFFERENTIAL     Status: Abnormal   Collection Time    04/10/14  5:30 AM      Result Value Ref Range   WBC 12.0 (*) 4.0 - 10.5 K/uL   RBC 3.80 (*) 3.87 - 5.11 MIL/uL   Hemoglobin 10.3 (*) 12.0 - 15.0 g/dL   HCT 31.9 (*) 36.0 - 46.0 %  MCV 83.9  78.0 - 100.0 fL   MCH 27.1  26.0 - 34.0 pg   MCHC 32.3  30.0 -  36.0 g/dL   RDW 14.5  11.5 - 15.5 %   Platelets 292  150 - 400 K/uL   Neutrophils Relative % 81 (*) 43 - 77 %   Neutro Abs 9.7 (*) 1.7 - 7.7 K/uL   Lymphocytes Relative 12  12 - 46 %   Lymphs Abs 1.4  0.7 - 4.0 K/uL   Monocytes Relative 6  3 - 12 %   Monocytes Absolute 0.7  0.1 - 1.0 K/uL   Eosinophils Relative 1  0 - 5 %   Eosinophils Absolute 0.1  0.0 - 0.7 K/uL   Basophils Relative 0  0 - 1 %   Basophils Absolute 0.0  0.0 - 0.1 K/uL  GLUCOSE, CAPILLARY     Status: Abnormal   Collection Time    04/10/14  7:26 AM      Result Value Ref Range   Glucose-Capillary 140 (*) 70 - 99 mg/dL   Comment 1 Documented in Chart     Comment 2 Notify RN      Dg Chest 2 View  04/09/2014   CLINICAL DATA:  Cough for 2 weeks.  EXAM: CHEST  2 VIEW  COMPARISON:  Single view of the chest 04/07/2014 and 03/27/2014. CT chest 02/26/2013.  FINDINGS: There is cardiomegaly without edema. No pneumothorax or pleural effusion.  IMPRESSION: Cardiomegaly without acute disease.   Electronically Signed   By: Inge Rise M.D.   On: 04/09/2014 18:04   Ct Abdomen Pelvis W Contrast  04/09/2014   CLINICAL DATA:  Progressive lower pelvic pain with nausea for 2 weeks. Fever.  EXAM: CT ABDOMEN AND PELVIS WITH CONTRAST  TECHNIQUE: Multidetector CT imaging of the abdomen and pelvis was performed using the standard protocol following bolus administration of intravenous contrast.  CONTRAST:  125mL OMNIPAQUE IOHEXOL 300 MG/ML SOLN, 95mL OMNIPAQUE IOHEXOL 300 MG/ML SOLN  COMPARISON:  03/27/2014  FINDINGS: Patchy airspace disease in the lung bases suggesting infiltration or edema. Minimal bilateral pleural effusions.  The liver, spleen, gallbladder, pancreas, adrenal glands, kidneys, abdominal aorta, and inferior vena cava are unremarkable. There is inflammatory stranding in the retroperitoneum. Retroperitoneal lymph nodes are not pathologically enlarged and are likely reactive. Small accessory spleen. Portal veins appear patent.  There are varices demonstrated in the left upper quadrant. Stomach, small bowel, and colon are not abnormally distended. Small umbilical hernia containing fat.  Pelvis: There is a large abscess in the anterior mid pelvis oriented slightly towards the right. The abscess measures 6.5 x 10.1 by 9.4 cm. There is extensive inflammatory stranding throughout the pelvis and extending up into the retroperitoneum. Smaller adjacent loculated fluid collections are present in the periphery of the main abscess. There is inflammatory stranding and thickening involving the sigmoid colon. Likely this represents locally perforated diverticulitis with diverticular abscess. No free intraperitoneal air is noted. Small amount of free fluid in the pelvis is likely reactive. Uterus appears surgically absent. No abnormal adnexal masses. Appendix is not identified. Degenerative changes in the lumbar spine. No destructive bone lesions appreciated.  IMPRESSION: Large pelvic abscess with associated inflammatory stranding throughout the pelvis and extending into the right lower quadrant and retroperitoneum. Small amount of free fluid in the pelvis. No free intraperitoneal air. This likely results from diverticulitis with local perforation and abscess formation. Appendix is not identified and abnormal appendix is not excluded.   Electronically Signed  By: Lucienne Capers M.D.   On: 04/09/2014 22:23    Review of Systems  All other systems reviewed and are negative.  Blood pressure 152/63, pulse 69, temperature 98.8 F (37.1 C), temperature source Oral, resp. rate 18, height 5' 3.75" (1.619 m), weight 197 lb (89.359 kg), SpO2 96.00%. Physical Exam  Constitutional: She is oriented to person, place, and time. She appears well-developed and well-nourished. No distress.  Neck: Normal range of motion. Neck supple.  Cardiovascular: Normal rate, regular rhythm, normal heart sounds and intact distal pulses.  Exam reveals no gallop and no  friction rub.   No murmur heard. Respiratory: Effort normal and breath sounds normal. No respiratory distress. She has no wheezes. She has no rales. She exhibits no tenderness.  GI: Soft. Bowel sounds are normal. She exhibits no distension and no mass. There is no rebound.  TTP RLQ with voluntary guarding, no rebound tenderness or peritoneal signs.  Lower midline scar.  I was unable to appreciate the ventral hernia.  Musculoskeletal: Normal range of motion. She exhibits no edema and no tenderness.  Lymphadenopathy:    She has no cervical adenopathy.  Neurological: She is alert and oriented to person, place, and time.  Skin: Skin is warm and dry. No rash noted. She is not diaphoretic. No erythema. No pallor.  Psychiatric: She has a normal mood and affect. Her behavior is normal. Judgment and thought content normal.    Assessment/Plan: Sepsis Intra-abdominal abscess, unclear whether from diverticulitis or appendicitis -Will ask IR to evaluate for possible percutaneous drain placement -agree with zosyn, would recommend stopping Vanc unless there are other indications -bowel rest -IVF -pain control  -there are no indications for urgent surgical intervention at present time.  Should she fail conservative management, then we will proceed with surgical intervention.  Surgery will follow along. -She will likely need a colonoscopy in about 6 weeks.   Ventral hernia, fat containing, asymptomatic  -unable to appreciate on exam, having BMs.   Zelma Snead ANP-BC 04/10/2014, 11:14 AM

## 2014-04-10 NOTE — H&P (Signed)
Referring Physician(s): [facility], Dr. Excell Seltzer  Subjective: 72 yo female admitted with abdominal pain and findings of pelvic abscess with possible sources including diverticulitis vs appendicitis. Surgery team was consulted and has ordered IR to eval for perc abscess drainage. Chart, meds, labs, and imaging reviewed. Pt has been NPO since this am. Last SQ heparin was at 0500.  Past Medical History  Diagnosis Date  . Hypertension   . Diabetes mellitus type II   . Syncope   . Bradycardia   . Weakness   . Cataract   . Osteoporosis    Past Surgical History  Procedure Laterality Date  . Eye surgery    . Cesarean section    . Abdominal hysterectomy     History   Social History  . Marital Status: Widowed    Spouse Name: N/A    Number of Children: N/A  . Years of Education: N/A   Occupational History  . Not on file.   Social History Main Topics  . Smoking status: Never Smoker   . Smokeless tobacco: Not on file  . Alcohol Use: No  . Drug Use: No  . Sexual Activity: Not on file   Other Topics Concern  . Not on file   Social History Narrative  . No narrative on file     Allergies: Codeine  Medications: Prior to Admission medications   Medication Sig Start Date End Date Taking? Authorizing Provider  flecainide (TAMBOCOR) 50 MG tablet Take 1 tablet (50 mg total) by mouth 2 (two) times daily. 04/05/14  Yes Josue Hector, MD  glipiZIDE (GLUCOTROL) 5 MG tablet Take 5 mg by mouth daily before breakfast.   Yes Historical Provider, MD  ibuprofen (ADVIL,MOTRIN) 200 MG tablet Take 400 mg by mouth every 6 (six) hours as needed for moderate pain.   Yes Historical Provider, MD  simvastatin (ZOCOR) 20 MG tablet Take 20 mg by mouth daily.   Yes Historical Provider, MD  valsartan-hydrochlorothiazide (DIOVAN-HCT) 160-25 MG per tablet Take 1 tablet by mouth daily.   Yes Historical Provider, MD  ondansetron (ZOFRAN) 4 MG tablet Take 1 tablet (4 mg total) by mouth  every 8 (eight) hours as needed for nausea or vomiting. 04/07/14   Margarita Mail, PA-C  traMADol (ULTRAM) 50 MG tablet Take 1 tablet (50 mg total) by mouth every 6 (six) hours as needed. 04/07/14   Margarita Mail, PA-C    Review of Systems  Vital Signs: BP 128/87  Pulse 75  Temp(Src) 97.7 F (36.5 C) (Oral)  Resp 18  Ht 5' 3.75" (1.619 m)  Wt 197 lb (89.359 kg)  BMI 34.09 kg/m2  SpO2 100%  Physical Exam  Constitutional: She is oriented to person, place, and time. She appears well-developed and well-nourished. No distress.  HENT:  Head: Normocephalic and atraumatic.  Neck: Normal range of motion. Neck supple. No tracheal deviation present.  Cardiovascular: Normal rate, regular rhythm and normal heart sounds.   No murmur heard. Pulmonary/Chest: Effort normal and breath sounds normal. No respiratory distress.  Abdominal: Soft. Bowel sounds are normal. There is tenderness.  RLQ tenderness    Neurological: She is alert and oriented to person, place, and time. No cranial nerve deficit.  Psychiatric: She has a normal mood and affect. Her behavior is normal. Judgment normal.    Imaging: Dg Chest 2 View  04/09/2014   CLINICAL DATA:  Cough for 2 weeks.  EXAM: CHEST  2 VIEW  COMPARISON:  Single view of the chest 04/07/2014  and 03/27/2014. CT chest 02/26/2013.  FINDINGS: There is cardiomegaly without edema. No pneumothorax or pleural effusion.  IMPRESSION: Cardiomegaly without acute disease.   Electronically Signed   By: Inge Rise M.D.   On: 04/09/2014 18:04   US Abdomen Complete  04/07/2014   CLINICAL DATA:  Generalized abdominal pain.  EXAM: ULTRASOUND ABDOMEN COMPLETE  COMPARISON:  Radiography same day.  CT 03/27/2014  FINDINGS: Gallbladder: No gallstones or wall thickening visualized. No sonographic Murphy sign noted.  Common bile duct: Diameter: 6 mm, normal  Liver: No focal lesion identified. Within normal limits in parenchymal echogenicity.  IVC: No abnormality visualized.   Pancreas: Visualized portion unremarkable.  Spleen: Size and appearance within normal limits.  Length 8.0 cm.  Right Kidney: Length: 10.9 cm. Echogenicity within normal limits. No mass or hydronephrosis visualized.  Left Kidney: Length: 11.5 cm. Echogenicity within normal limits. No mass or hydronephrosis visualized.  Abdominal aorta: Atherosclerotic change but no aneurysm  Other findings: No ascites  IMPRESSION: No pathologic finding.   Electronically Signed   By: Nelson Chimes M.D.   On: 04/07/2014 20:27   Ct Abdomen Pelvis W Contrast  04/09/2014   CLINICAL DATA:  Progressive lower pelvic pain with nausea for 2 weeks. Fever.  EXAM: CT ABDOMEN AND PELVIS WITH CONTRAST  TECHNIQUE: Multidetector CT imaging of the abdomen and pelvis was performed using the standard protocol following bolus administration of intravenous contrast.  CONTRAST:  132mL OMNIPAQUE IOHEXOL 300 MG/ML SOLN, 53mL OMNIPAQUE IOHEXOL 300 MG/ML SOLN  COMPARISON:  03/27/2014  FINDINGS: Patchy airspace disease in the lung bases suggesting infiltration or edema. Minimal bilateral pleural effusions.  The liver, spleen, gallbladder, pancreas, adrenal glands, kidneys, abdominal aorta, and inferior vena cava are unremarkable. There is inflammatory stranding in the retroperitoneum. Retroperitoneal lymph nodes are not pathologically enlarged and are likely reactive. Small accessory spleen. Portal veins appear patent. There are varices demonstrated in the left upper quadrant. Stomach, small bowel, and colon are not abnormally distended. Small umbilical hernia containing fat.  Pelvis: There is a large abscess in the anterior mid pelvis oriented slightly towards the right. The abscess measures 6.5 x 10.1 by 9.4 cm. There is extensive inflammatory stranding throughout the pelvis and extending up into the retroperitoneum. Smaller adjacent loculated fluid collections are present in the periphery of the main abscess. There is inflammatory stranding and thickening  involving the sigmoid colon. Likely this represents locally perforated diverticulitis with diverticular abscess. No free intraperitoneal air is noted. Small amount of free fluid in the pelvis is likely reactive. Uterus appears surgically absent. No abnormal adnexal masses. Appendix is not identified. Degenerative changes in the lumbar spine. No destructive bone lesions appreciated.  IMPRESSION: Large pelvic abscess with associated inflammatory stranding throughout the pelvis and extending into the right lower quadrant and retroperitoneum. Small amount of free fluid in the pelvis. No free intraperitoneal air. This likely results from diverticulitis with local perforation and abscess formation. Appendix is not identified and abnormal appendix is not excluded.   Electronically Signed   By: Lucienne Capers M.D.   On: 04/09/2014 22:23   Dg Abd Acute W/chest  04/07/2014   CLINICAL DATA:  Abdominal pain for 2 weeks.  Vomiting last week.  EXAM: ACUTE ABDOMEN SERIES (ABDOMEN 2 VIEW & CHEST 1 VIEW)  COMPARISON:  CT abdomen and pelvis 03/27/2014. Chest in two views abdomen 03/27/2014.  FINDINGS: Single view of the chest demonstrates cardiomegaly without edema. No pneumothorax or pleural fluid.  Two views of the abdomen show no  free intraperitoneal air. The bowel gas pattern is normal. No abnormal abdominal calcification is seen.  IMPRESSION: No acute finding chest or abdomen.  Cardiomegaly.   Electronically Signed   By: Inge Rise M.D.   On: 04/07/2014 18:38    Labs:  CBC:  Recent Labs  08/15/13 1627  04/07/14 1535 04/09/14 1745 04/09/14 2046 04/10/14 0530  WBC 4.0  < > 14.5* 14.3* 11.6* 12.0*  HGB 13.4  < > 12.4 11.1* 10.7* 10.3*  HCT 40.3  < > 38.5 34.8* 32.6* 31.9*  PLT 184  --  296  --  285 292  < > = values in this interval not displayed.  COAGS:  Recent Labs  04/10/14 1215  INR 1.23    BMP:  Recent Labs  05/22/13 0820  04/07/14 1535 04/09/14 1740 04/09/14 2046 04/10/14 0530    NA 135  < > 136* 137 137 139  K 3.8  < > 4.1 4.1 3.8 3.7  CL 98  < > 98 100 100 101  CO2 27  < > 25 27 22 26   GLUCOSE 176*  < > 115* 117* 115* 125*  BUN 16  < > 8 8 7 6   CALCIUM 9.3  < > 9.2 8.5 8.5 8.4  CREATININE 0.83  < > 0.91 0.86 0.88 0.90  GFRNONAA 68*  --  60*  --  63* 61*  GFRAA 79*  --  70*  --  73* 71*  < > = values in this interval not displayed.  LIVER FUNCTION TESTS:  Recent Labs  04/07/14 1535 04/09/14 1740 04/09/14 2046 04/10/14 0530  BILITOT 0.6 0.8 0.6 0.7  AST 30 25 26 23   ALT 34 23 24 21   ALKPHOS 244* 189* 184* 170*  PROT 8.0 6.7 7.0 6.8  ALBUMIN 2.5* 2.8* 2.2* 2.2*    Assessment and Plan: Intra-abdominal/pelvic abscess, appearance favors appendicitis with fecalith present in abscess cavity. Dr. Anselm Pancoast has reviewed and feels attempt at perc drain placement is warranted. Explained procedure, risks, complications, use of sedation to pt. Labs reviewed, ok Consent signed in chart   I spent a total of 20 minutes face to face in clinical consultation/evaluation, greater than 50% of which was counseling/coordinating care for drainage of intra-abdominal abscess.  SignedAscencion Dike 04/10/2014, 2:01 PM

## 2014-04-10 NOTE — Consult Note (Signed)
Patient interviewed and examined, agree with NP note above. This likely represents a diverticular abscess. Cannot rule out appendix a source but seems less likely. Agree with percutaneous drainage and antibiotics as initial therapy Edward Jolly MD, FACS  04/10/2014 2:33 PM

## 2014-04-10 NOTE — ED Provider Notes (Signed)
Medical screening examination/treatment/procedure(s) were conducted as a shared visit with non-physician practitioner(s) and myself.  I personally evaluated the patient during the encounter.  Please see separate associated note for evaluation and plan.    EKG Interpretation None       Orpah Greek, MD 04/10/14 1444

## 2014-04-10 NOTE — H&P (Addendum)
[facility] PROGRESS NOTE  Catherine Joseph EHU:[id number] DOB: Aug 31, 1940 DOA: 04/09/2014 PCP: Kennon Portela, MD  Brief narrative: 73 y.o. year old female with HTN, NIDDM presented to [place] main concern of several days duration of progressively worsening abdominal pain, constant and 7/10 in severity, non radiating, associated with fevers, chills, poor oral intake, malaise, no similar events in the past. She was initially seen at [facility] on 10/7. Had CT done that was negative for specific abd process that could explain the symptoms. She was started on miralax. Per pt, symptoms were getting worse and she has presented back to [facility] with T 103 F and severe abd pain. Pt was referred to ED.  In ED, VS notable for Tmax 103F, HR in 90's. Blood work with WBC 14.5, Hgb 11, Cr 0.88, Lactate WNL. UA suggestive of an infection. CT with Large pelvic abscess with associated inflammatory stranding throughout the pelvis and extending into the right lower quadrant and retroperitoneum with likely diverticulitis with local perforation and abscess formation. CXR with cardiomegaly without acute disease. Zosyn started in ED and surgery consulted.   Assessment/Plan:    Principal Problem:   Sepsis  Sepsis criteria met: T 103F, hypotension, respiratory rate 28 bpm, leukocytosis 14.3  Secondary to intraabdominal abscess as noted on CT abd/pelvis  Pt is clinically stable this AM  Surgery consulted, will follow up on recommendations  Continue Vancomycin and Zosyn day #2  Follow up on blood culture  Active Problems:   Intra-abdominal abscess from ? diverticulitis and local perforation   ABS as noted above  Continue IVF, analgesia and antiemetics as needed   Follow up on surgery recommendations    Hypertension  Stable with SBP in 150's this AM  At home takes Diovan but will continue to hold here  Place on Hydralazine IV as needed for now    Diabetes mellitus, type II  Reasonable  inpatient control   Continue SSI only as pt is NPO    Anemia of chronic disease  Drop in Hg since admission  Dilutional component, no signs of active bleeding   CBC monitoring daily  DVT Prophylaxis: heparin sq  Code Status: Full.  Family Communication:  plan of care discussed with the patient Disposition Plan: Home when stable.   IV Access:   Peripheral IV Procedures and diagnostic studies:    CXR 04/09/2014  Cardiomegaly without acute disease.     Ct Abd/Pelvis W/C  04/09/2014  Large pelvic abscess with associated inflammatory stranding throughout the pelvis and extending into the right lower quadrant and retroperitoneum. Small amount of free fluid in the pelvis. No free intraperitoneal air. This likely results from diverticulitis with local perforation and abscess formation. Appendix is not identified and abnormal appendix is not excluded.    Medical Consultants:   Surgery  Other Consultants:   None  Anti-Infectives:   Vancomycin 10/20 --> Zosyn 10/20 -->  Leisa Lenz, MD  [facility] Pager [phone]  If 7PM-7AM, please contact night-coverage [email or web address] Password [email or web address] 04/10/2014, 10:10 AM   LOS: 1 day    HPI/Subjective: No acute overnight events.  Objective: Filed Vitals:   04/09/14 2254 04/09/14 2333 04/10/14 0015 04/10/14 0504  BP: 117/50  144/61 152/63  Pulse:   68 69  Temp: 98.7 F (37.1 C)  98.4 F (36.9 C) 98.8 F (37.1 C)  TempSrc:   Oral Oral  Resp: _0 Height:  5' 3.75" (1.619 m)    Weight:  89.359 kg (197 lb)  SpO2: 98%  99% 96%    Intake/Output Summary (Last 24 hours) at 04/10/14 1010 Last data filed at 04/10/14 0604  Gross per 24 hour  Intake 893.34 ml  Output      0 ml  Net 893.34 ml    Exam:   General:  Pt is alert, follows commands appropriately, not in acute distress  Cardiovascular: Regular rate and rhythm, S1/S2, no murmurs  Respiratory: Clear to auscultation bilaterally, no wheezing, no crackles, no  rhonchi  Abdomen: Soft, non tender, non distended, bowel sounds present  Extremities: No edema, pulses DP and PT palpable bilaterally  Neuro: Grossly nonfocal  Data Reviewed: Basic Metabolic Panel:  Recent Labs Lab 04/07/14 1535 04/09/14 2046 04/10/14 0530  NA 136* 137 139  K 4.1 3.8 3.7  CL 98 100 101  CO2 _0 GLUCOSE 115* 115* 125*  BUN _1 CREATININE 0.91 0.88 0.90  CALCIUM 9.2 8.5 8.4   Liver Function Tests:  Recent Labs Lab 04/07/14 1535 04/09/14 2046 04/10/14 0530  AST _2 ALT 34 24 21  ALKPHOS 244* 184* 170*  BILITOT 0.6 0.6 0.7  PROT 8.0 7.0 6.8  ALBUMIN 2.5* 2.2* 2.2*    Recent Labs Lab 04/07/14 1535  LIPASE 27   CBC:  Recent Labs Lab 04/07/14 1535 04/09/14 1745 04/09/14 2046 04/10/14 0530  WBC 14.5* 14.3* 11.6* 12.0*  NEUTROABS 11.6*  --  9.5* 9.7*  HGB 12.4 11.1* 10.7* 10.3*  HCT 38.5 34.8* 32.6* 31.9*  MCV 84.8 84.6 83.6 83.9  PLT 296  --  285 292   CBG:  Recent Labs Lab 04/10/14 0025 04/10/14 0423 04/10/14 0726  GLUCAP 110* 108* 140*    Recent Results (from the past 240 hour(s))  URINE CULTURE     Status: None   Collection Time    04/07/14  3:26 PM      Result Value Ref Range Status   Specimen Description URINE, CLEAN CATCH   Final   Special Requests NONE   Final   Culture  Setup Time     Final   Value: 04/08/2014 08:54     Performed at [place]     Final   Value: 20,OOO COLONIES/ML     Performed at [facility]   Culture     Final   Value: Multiple bacterial morphotypes present, none predominant. Suggest appropriate recollection if clinically indicated.     Performed at [facility]   Report Status 04/09/2014 FINAL   Final     Scheduled Meds: . heparin  5,000 Units Subcutaneous 3 times per day  . [START ON 04/11/2014] Influenza vac split quadrivalent PF  0.5 mL Intramuscular Tomorrow-1000  . insulin aspart  0-9 Units Subcutaneous 6 times per day  .  piperacillin-tazobactam (ZOSYN)  IV  3.375 g Intravenous 3 times per day  . vancomycin  750 mg Intravenous Q12H   Continuous Infusions: . sodium chloride 100 mL/hr at 04/10/14 0008

## 2014-04-10 NOTE — Procedures (Signed)
CT guided drainage of lower abdominal abscess.  75 ml of foul smelling purulent yellow fluid removed.  Placed a 10 [place] drain.  No immediate complication.

## 2014-04-11 ENCOUNTER — Encounter: Payer: Self-pay | Admitting: Family Medicine

## 2014-04-11 DIAGNOSIS — A4151 Sepsis due to Escherichia coli [E. coli]: Secondary | ICD-10-CM | POA: Diagnosis not present

## 2014-04-11 LAB — BASIC METABOLIC PANEL
ANION GAP: 11 (ref 5–15)
BUN: 5 mg/dL — ABNORMAL LOW (ref 6–23)
CALCIUM: 8.4 mg/dL (ref 8.4–10.5)
CHLORIDE: 103 meq/L (ref 96–112)
CO2: 25 meq/L (ref 19–32)
Creatinine, Ser: 0.82 mg/dL (ref 0.50–1.10)
GFR calc Af Amer: 79 mL/min — ABNORMAL LOW (ref >90)
GFR calc non Af Amer: 68 mL/min — ABNORMAL LOW (ref >90)
GLUCOSE: 106 mg/dL — AB (ref 70–99)
POTASSIUM: 3.4 meq/L — AB (ref 3.7–5.3)
SODIUM: 139 meq/L (ref 137–147)

## 2014-04-11 LAB — CBC
HCT: 32.4 % — ABNORMAL LOW (ref 36.0–46.0)
HEMOGLOBIN: 10.5 g/dL — AB (ref 12.0–15.0)
MCH: 27.3 pg (ref 26.0–34.0)
MCHC: 32.4 g/dL (ref 30.0–36.0)
MCV: 84.4 fL (ref 78.0–100.0)
PLATELETS: 339 10*3/uL (ref 150–400)
RBC: 3.84 MIL/uL — ABNORMAL LOW (ref 3.87–5.11)
RDW: 14.5 % (ref 11.5–15.5)
WBC: 10 10*3/uL (ref 4.0–10.5)

## 2014-04-11 LAB — GLUCOSE, CAPILLARY
GLUCOSE-CAPILLARY: 109 mg/dL — AB (ref 70–99)
Glucose-Capillary: 108 mg/dL — ABNORMAL HIGH (ref 70–99)
Glucose-Capillary: 124 mg/dL — ABNORMAL HIGH (ref 70–99)
Glucose-Capillary: 127 mg/dL — ABNORMAL HIGH (ref 70–99)
Glucose-Capillary: 160 mg/dL — ABNORMAL HIGH (ref 70–99)
Glucose-Capillary: 208 mg/dL — ABNORMAL HIGH (ref 70–99)

## 2014-04-11 MED ORDER — HYDRALAZINE HCL 20 MG/ML IJ SOLN: 10.0000 mg | Freq: Four times a day (QID) | INTRAMUSCULAR | Status: DC | PRN

## 2014-04-11 MED ORDER — POTASSIUM CHLORIDE CRYS ER 20 MEQ PO TBCR
40.0000 meq | EXTENDED_RELEASE_TABLET | Freq: Once | ORAL | Status: AC
  Administered 2014-04-11: 40 meq via ORAL
  Filled 2014-04-11: qty 2

## 2014-04-11 NOTE — Progress Notes (Signed)
Patient ID: Catherine Joseph, female   DOB: March 11, 1941, 73 y.o.   MRN: [id number]    Subjective: Feels better than yesterday. Less abdominal pain. No nuchal complaints.  Objective: Vital signs in last 24 hours: Temp:  [97.6 F (36.4 C)-100.4 F (38 C)] 98.2 F (36.8 C) (10/22 0700) Pulse Rate:  [59-89] 64 (10/22 0700) Resp:  [16-37] 20 (10/22 0700) BP: (124-151)/(56-76) 145/72 mmHg (10/22 0700) SpO2:  [86 %-100 %] 100 % (10/22 0700)    Intake/Output from previous day: 10/21 0701 - 10/22 0700 In: -  Out: 310 [Urine:200; Drains:110] Intake/Output this shift:    General appearance: alert, cooperative and no distress GI: mild lower abdominal tenderness, improved. Her percutaneous drain in place right lower quadrant cloudy serosanguineous drainage  Lab Results:   Recent Labs  04/10/14 0530 04/11/14 0515  WBC 12.0* 10.0  HGB 10.3* 10.5*  HCT 31.9* 32.4*  PLT 292 339   BMET  Recent Labs  04/10/14 0530 04/11/14 0515  NA 139 139  K 3.7 3.4*  CL 101 103  CO2 26 25  GLUCOSE 125* 106*  BUN 6 5*  CREATININE 0.90 0.82  CALCIUM 8.4 8.4     Studies/Results: Dg Chest 2 View  04/09/2014   CLINICAL DATA:  Cough for 2 weeks.  EXAM: CHEST  2 VIEW  COMPARISON:  Single view of the chest 04/07/2014 and 03/27/2014. CT chest 02/26/2013.  FINDINGS: There is cardiomegaly without edema. No pneumothorax or pleural effusion.  IMPRESSION: Cardiomegaly without acute disease.   Electronically Signed   By: Inge Rise M.D.   On: 04/09/2014 18:04   Ct Abdomen Pelvis W Contrast  04/09/2014   CLINICAL DATA:  Progressive lower pelvic pain with nausea for 2 weeks. Fever.  EXAM: CT ABDOMEN AND PELVIS WITH CONTRAST  TECHNIQUE: Multidetector CT imaging of the abdomen and pelvis was performed using the standard protocol following bolus administration of intravenous contrast.  CONTRAST:  110mL OMNIPAQUE IOHEXOL 300 MG/ML SOLN, 62mL OMNIPAQUE IOHEXOL 300 MG/ML SOLN  COMPARISON:  03/27/2014   FINDINGS: Patchy airspace disease in the lung bases suggesting infiltration or edema. Minimal bilateral pleural effusions.  The liver, spleen, gallbladder, pancreas, adrenal glands, kidneys, abdominal aorta, and inferior vena cava are unremarkable. There is inflammatory stranding in the retroperitoneum. Retroperitoneal lymph nodes are not pathologically enlarged and are likely reactive. Small accessory spleen. Portal veins appear patent. There are varices demonstrated in the left upper quadrant. Stomach, small bowel, and colon are not abnormally distended. Small umbilical hernia containing fat.  Pelvis: There is a large abscess in the anterior mid pelvis oriented slightly towards the right. The abscess measures 6.5 x 10.1 by 9.4 cm. There is extensive inflammatory stranding throughout the pelvis and extending up into the retroperitoneum. Smaller adjacent loculated fluid collections are present in the periphery of the main abscess. There is inflammatory stranding and thickening involving the sigmoid colon. Likely this represents locally perforated diverticulitis with diverticular abscess. No free intraperitoneal air is noted. Small amount of free fluid in the pelvis is likely reactive. Uterus appears surgically absent. No abnormal adnexal masses. Appendix is not identified. Degenerative changes in the lumbar spine. No destructive bone lesions appreciated.  IMPRESSION: Large pelvic abscess with associated inflammatory stranding throughout the pelvis and extending into the right lower quadrant and retroperitoneum. Small amount of free fluid in the pelvis. No free intraperitoneal air. This likely results from diverticulitis with local perforation and abscess formation. Appendix is not identified and abnormal appendix is not excluded.  Electronically Signed   By: Lucienne Capers M.D.   On: 04/09/2014 22:23   Ct Image Guided Drainage By Percutaneous Catheter  04/10/2014   CLINICAL DATA:  73 year old female with an  abdominal abscess in the lower abdomen. Abscess is likely from a perforated appendicitis.  EXAM: CT GUIDED DRAINAGE OF LOWER ABDOMINAL ABSCESS  ANESTHESIA/SEDATION: 3 mg versed, 100 mcg fentanyl. A radiology nurse monitored the patient for moderate sedation.  Total Moderate Sedation Time:  25 minutes  PROCEDURE: The procedure was explained to the patient. The risks and benefits of the procedure were discussed and the patient's questions were addressed. Informed consent was obtained from the patient. The patient was placed on the CT scanner and the right side was mildly elevated. CT images through the lower abdomen and pelvis were obtained. The right side of the abdomen was prepped and draped in sterile fashion. Skin and soft tissues were anesthetized with 1% lidocaine. An 18 gauge needle was directed towards the intra-abdominal abscess with CT guidance. Foul-smelling yellow purulent fluid was aspirated from the needle. A stiff Amplatz wire was advanced into the collection. The tract was dilated and a 10 [place] multipurpose drain was placed. 75 mL of purulent fluid was removed. Sample was sent for culture. Catheter was attached to a suction bulb and sutured to the skin.  COMPLICATIONS: None  FINDINGS: Large abscess in the lower abdomen. There is a calcification along the superior aspect of the collection which is most compatible with an appendicolith. There is wall thickening in the sigmoid colon and adjacent small bowel loops.  IMPRESSION: CT-guided drainage of the lower abdominal abscess.   Electronically Signed   By: Markus Daft M.D.   On: 04/10/2014 17:53    Anti-infectives: Anti-infectives   Start     Dose/Rate Route Frequency Ordered Stop   04/10/14 1000  vancomycin (VANCOCIN) IVPB 750 mg/150 ml premix     750 mg 150 mL/hr over 60 Minutes Intravenous Every 12 hours 04/10/14 0523     04/10/14 0600  piperacillin-tazobactam (ZOSYN) IVPB 3.375 g     3.375 g 12.5 mL/hr over 240 Minutes Intravenous 3 times  per day 04/10/14 0523     04/09/14 2315  vancomycin (VANCOCIN) IVPB 1000 mg/200 mL premix     1,000 mg 200 mL/hr over 60 Minutes Intravenous  Once 04/09/14 2314 04/10/14 0155   04/09/14 2230  piperacillin-tazobactam (ZOSYN) IVPB 3.375 g     3.375 g 100 mL/hr over 30 Minutes Intravenous  Once 04/09/14 2227 04/09/14 2348      Assessment/Plan: Doing well following percutaneous drainage of pelvic abscess with less pain and white count normalized. Less tenderness. Possible appendiceal versus diverticular abscess. Continue drainage and antibiotics.     LOS: 2 days    Brea Coleson T 04/11/2014

## 2014-04-11 NOTE — Progress Notes (Signed)
Patient ID: Catherine Joseph, female   DOB: 1941-02-16, 73 y.o.   MRN: [id number]  [facility] PROGRESS NOTE  Catherine Joseph XBJ:[id number] DOB: Jan 10, 1941 DOA: 04/09/2014 PCP: Kennon Portela, MD  Brief narrative:  73 y.o. year old female with HTN, NIDDM presented to [facility] main concern of several days duration of progressively worsening abdominal pain, constant and 7/10 in severity, non radiating, associated with fevers, chills, poor oral intake, malaise, no similar events in the past. She was initially seen at [facility] on 10/7. Had CT done that was negative for specific abd process that could explain the symptoms. She was started on miralax. Per pt, symptoms were getting worse and she has presented back to [facility] with T 103 F and severe abd pain. Pt was referred to ED.   In ED, VS notable for Tmax 103F, HR in 90's. Blood work with WBC 14.5, Hgb 11, Cr 0.88, Lactate WNL. UA suggestive of an infection. CT with Large pelvic abscess with associated inflammatory stranding throughout the pelvis and extending into the right lower quadrant and retroperitoneum with likely diverticulitis with local perforation and abscess formation. CXR with cardiomegaly without acute disease. Zosyn started in ED and surgery consulted.   Assessment/Plan:   Principal Problem:  Sepsis  Sepsis criteria met: T 103F, hypotension, respiratory rate 28 bpm, leukocytosis 14.3  Secondary to intraabdominal abscess as noted on CT abd/pelvis  Pt is s/p CT guided drain placed 10/21, post op day #1, doing well and clinically stable (done by IR Dr. Anselm Pancoast)  Continue Vancomycin and Zosyn day #3 Follow up on blood culture  Appreciate surgery and IR help  Active Problems:  Intra-abdominal abscess from ? diverticulitis and local perforation  S/P Ct guided drain placement as noted above  Continue ABX as noted above  Continue IVF, analgesia and antiemetics as needed  Hypertension  Stable with SBP in 130's this AM  At home  takes Diovan, continue to hold until PO intake improves  Place on Hydralazine IV as needed for now  Diabetes mellitus, type II  Reasonable inpatient control  Continue SSI only as pt now on clear liquids  Anemia of chronic disease  Drop in Hg since admission but remains stable at 10.5 Dilutional component, no signs of active bleeding  CBC monitoring daily Hypokalemia  Mild, supplement and repeat BMP in AM  DVT Prophylaxis: heparin sq   Code Status: Full.  Family Communication: plan of care discussed with the patient  Disposition Plan: Home when stable.   IV Access:    Peripheral IV Procedures and diagnostic studies:    CXR 04/09/2014 Cardiomegaly without acute disease.   Ct Abd/Pelvis W/C 04/09/2014 Large pelvic abscess with associated inflammatory stranding throughout the pelvis and extending into the right lower quadrant and retroperitoneum. Small amount of free fluid in the pelvis. No free intraperitoneal air. This likely results from diverticulitis with local perforation and abscess formation. Appendix is not identified and abnormal appendix is not excluded.   04/10/2014   CT-guided drainage of the lower abdominal abscess.    Medical Consultants:    Surgery  Other Consultants:    None  Anti-Infectives:    Vancomycin 10/20 -->   Zosyn 10/20 -->  Leisa Lenz, MD  [facility] Pager [phone]  If 7PM-7AM, please contact night-coverage [email or web address] Password [facility] 04/11/2014, 11:51 AM   LOS: 2 days   HPI/Subjective: No acute overnight events.  Objective: Filed Vitals:   04/10/14 2054 04/11/14 0545 04/11/14 0700 04/11/14 0957  BP: 137/76  140/64 145/72 135/63  Pulse: 71 59 64 68  Temp: 99.1 F (37.3 C) 97.6 F (36.4 C) 98.2 F (36.8 C) 98.2 F (36.8 C)  TempSrc: Oral Oral Oral Oral  Resp: $Remo'20 18 20 18  'nCXtS$ Height:      Weight:      SpO2: 100% 100% 100%     Intake/Output Summary (Last 24 hours) at 04/11/14 1151 Last data filed at 04/11/14 0944   Gross per 24 hour  Intake    480 ml  Output    310 ml  Net    170 ml    Exam:   General:  Pt is alert, follows commands appropriately, not in acute distress  Cardiovascular: Regular rate and rhythm, S1/S2, no murmurs  Respiratory: Clear to auscultation bilaterally, no wheezing, no crackles, no rhonchi  Abdomen: lower abdominal tenderness, perc drain in place in RLQ with cloudy serosanguineous drainage  Extremities: No edema, pulses DP and PT palpable bilaterally  Neuro: Grossly nonfocal  Data Reviewed: Basic Metabolic Panel:  Recent Labs Lab 04/07/14 1535 04/09/14 1740 04/09/14 2046 04/10/14 0530 04/11/14 0515  NA 136* 137 137 139 139  K 4.1 4.1 3.8 3.7 3.4*  CL 98 100 100 101 103  CO2 $Re'25 27 22 26 25  'OKP$ GLUCOSE 115* 117* 115* 125* 106*  BUN $Re'8 8 7 6 'bOc$ 5*  CREATININE 0.91 0.86 0.88 0.90 0.82  CALCIUM 9.2 8.5 8.5 8.4 8.4   Liver Function Tests:  Recent Labs Lab 04/07/14 1535 04/09/14 1740 04/09/14 2046 04/10/14 0530  AST $Re'30 25 26 23  'wCu$ ALT 34 $Remo'23 24 21  'tMnNb$ ALKPHOS 244* 189* 184* 170*  BILITOT 0.6 0.8 0.6 0.7  PROT 8.0 6.7 7.0 6.8  ALBUMIN 2.5* 2.8* 2.2* 2.2*    Recent Labs Lab 04/07/14 1535  LIPASE 27   No results found for this basename: AMMONIA,  in the last 168 hours CBC:  Recent Labs Lab 04/07/14 1535 04/09/14 1745 04/09/14 2046 04/10/14 0530 04/11/14 0515  WBC 14.5* 14.3* 11.6* 12.0* 10.0  NEUTROABS 11.6*  --  9.5* 9.7*  --   HGB 12.4 11.1* 10.7* 10.3* 10.5*  HCT 38.5 34.8* 32.6* 31.9* 32.4*  MCV 84.8 84.6 83.6 83.9 84.4  PLT 296  --  285 292 339   Cardiac Enzymes: No results found for this basename: CKTOTAL, CKMB, CKMBINDEX, TROPONINI,  in the last 168 hours BNP: No components found with this basename: POCBNP,  CBG:  Recent Labs Lab 04/10/14 1803 04/10/14 1948 04/11/14 0009 04/11/14 0357 04/11/14 0750  GLUCAP 84 176* 160* 127* 108*    Recent Results (from the past 240 hour(s))  URINE CULTURE     Status: None   Collection Time     04/07/14  3:26 PM      Result Value Ref Range Status   Specimen Description URINE, CLEAN CATCH   Final   Special Requests NONE   Final   Culture  Setup Time     Final   Value: 04/08/2014 08:54     Performed at [place]     Final   Value: 20,OOO COLONIES/ML     Performed at [facility]   Culture     Final   Value: Multiple bacterial morphotypes present, none predominant. Suggest appropriate recollection if clinically indicated.     Performed at [facility]   Report Status 04/09/2014 FINAL   Final  URINE CULTURE     Status: None   Collection Time  04/09/14  5:40 PM      Result Value Ref Range Status   Colony Count NO GROWTH   Final   Organism ID, Bacteria NO GROWTH   Final  CULTURE, BLOOD (ROUTINE X 2)     Status: None   Collection Time    04/09/14  8:46 PM      Result Value Ref Range Status   Specimen Description BLOOD LEFT ANTECUBITAL   Final   Special Requests BOTTLES DRAWN AEROBIC AND ANAEROBIC 5ML   Final   Culture  Setup Time     Final   Value: 04/10/2014 00:35     Performed at [facility]   Culture     Final   Value:        BLOOD CULTURE RECEIVED NO GROWTH TO DATE CULTURE WILL BE HELD FOR 5 DAYS BEFORE ISSUING A FINAL NEGATIVE REPORT     Performed at [facility]   Report Status PENDING   Incomplete  CULTURE, BLOOD (ROUTINE X 2)     Status: None   Collection Time    04/09/14  8:46 PM      Result Value Ref Range Status   Specimen Description BLOOD LEFT HAND   Final   Special Requests BOTTLES DRAWN AEROBIC AND ANAEROBIC 5ML   Final   Culture  Setup Time     Final   Value: 04/10/2014 00:35     Performed at [facility]   Culture     Final   Value:        BLOOD CULTURE RECEIVED NO GROWTH TO DATE CULTURE WILL BE HELD FOR 5 DAYS BEFORE ISSUING A FINAL NEGATIVE REPORT     Performed at [facility]   Report Status PENDING   Incomplete  URINE CULTURE     Status: None   Collection Time     04/09/14  9:20 PM      Result Value Ref Range Status   Specimen Description URINE, CLEAN CATCH   Final   Special Requests NONE   Final   Culture  Setup Time     Final   Value: 04/10/2014 00:53     Performed at [place]     Final   Value: 30,000 COLONIES/ML     Performed at [facility]   Culture     Final   Value: Multiple bacterial morphotypes present, none predominant. Suggest appropriate recollection if clinically indicated.     Performed at [facility]   Report Status 04/10/2014 FINAL   Final  CULTURE, ROUTINE-ABSCESS     Status: None   Collection Time    04/10/14  5:30 PM      Result Value Ref Range Status   Specimen Description PERITONEAL CAVITY   Final   Special Requests NONE   Final   Gram Stain     Final   Value: ABUNDANT WBC PRESENT,BOTH PMN AND MONONUCLEAR     NO SQUAMOUS EPITHELIAL CELLS SEEN     ABUNDANT GRAM NEGATIVE RODS     Performed at [facility]     Final   Value: Culture reincubated for better growth     Performed at [facility]   Report Status PENDING   Incomplete     Scheduled Meds: . heparin  5,000 Units Subcutaneous 3 times per day  . insulin aspart  0-9 Units Subcutaneous 6 times per day  . piperacillin-tazobactam (ZOSYN)  IV  3.375 g Intravenous 3 times per day  . potassium chloride  40 mEq Oral Once  . vancomycin  750 mg Intravenous Q12H   Continuous Infusions: . sodium chloride 100 mL/hr at 04/10/14 0008

## 2014-04-11 NOTE — Progress Notes (Signed)
Subjective: Patient states her abdominal pain is improving after drain placement.   Allergies: Codeine  Medications: Prior to Admission medications   Medication Sig Start Date End Date Taking? Authorizing Provider  flecainide (TAMBOCOR) 50 MG tablet Take 1 tablet (50 mg total) by mouth 2 (two) times daily. 04/05/14  Yes Josue Hector, MD  glipiZIDE (GLUCOTROL) 5 MG tablet Take 5 mg by mouth daily before breakfast.   Yes Historical Provider, MD  ibuprofen (ADVIL,MOTRIN) 200 MG tablet Take 400 mg by mouth every 6 (six) hours as needed for moderate pain.   Yes Historical Provider, MD  simvastatin (ZOCOR) 20 MG tablet Take 20 mg by mouth daily.   Yes Historical Provider, MD  valsartan-hydrochlorothiazide (DIOVAN-HCT) 160-25 MG per tablet Take 1 tablet by mouth daily.   Yes Historical Provider, MD  ondansetron (ZOFRAN) 4 MG tablet Take 1 tablet (4 mg total) by mouth every 8 (eight) hours as needed for nausea or vomiting. 04/07/14   Margarita Mail, PA-C  traMADol (ULTRAM) 50 MG tablet Take 1 tablet (50 mg total) by mouth every 6 (six) hours as needed. 04/07/14   Margarita Mail, PA-C    Review of Systems  Vital Signs: BP 138/73  Pulse 63  Temp(Src) 98.6 F (37 C) (Oral)  Resp 22  Ht 5' 3.75" (1.619 m)  Wt 197 lb (89.359 kg)  BMI 34.09 kg/m2  SpO2 100%  Physical Exam General: A&Ox3, NAD Abd: RLQ perc drain intact, 50 cc cloudy serosang output in JP bulb, 24 hour output 110cc, NT  Imaging: Dg Chest 2 View  04/09/2014   CLINICAL DATA:  Cough for 2 weeks.  EXAM: CHEST  2 VIEW  COMPARISON:  Single view of the chest 04/07/2014 and 03/27/2014. CT chest 02/26/2013.  FINDINGS: There is cardiomegaly without edema. No pneumothorax or pleural effusion.  IMPRESSION: Cardiomegaly without acute disease.   Electronically Signed   By: Inge Rise M.D.   On: 04/09/2014 18:04   US Abdomen Complete  04/07/2014   CLINICAL DATA:  Generalized abdominal pain.  EXAM: ULTRASOUND ABDOMEN COMPLETE   COMPARISON:  Radiography same day.  CT 03/27/2014  FINDINGS: Gallbladder: No gallstones or wall thickening visualized. No sonographic Murphy sign noted.  Common bile duct: Diameter: 6 mm, normal  Liver: No focal lesion identified. Within normal limits in parenchymal echogenicity.  IVC: No abnormality visualized.  Pancreas: Visualized portion unremarkable.  Spleen: Size and appearance within normal limits.  Length 8.0 cm.  Right Kidney: Length: 10.9 cm. Echogenicity within normal limits. No mass or hydronephrosis visualized.  Left Kidney: Length: 11.5 cm. Echogenicity within normal limits. No mass or hydronephrosis visualized.  Abdominal aorta: Atherosclerotic change but no aneurysm  Other findings: No ascites  IMPRESSION: No pathologic finding.   Electronically Signed   By: Nelson Chimes M.D.   On: 04/07/2014 20:27   Ct Abdomen Pelvis W Contrast  04/09/2014   CLINICAL DATA:  Progressive lower pelvic pain with nausea for 2 weeks. Fever.  EXAM: CT ABDOMEN AND PELVIS WITH CONTRAST  TECHNIQUE: Multidetector CT imaging of the abdomen and pelvis was performed using the standard protocol following bolus administration of intravenous contrast.  CONTRAST:  142mL OMNIPAQUE IOHEXOL 300 MG/ML SOLN, 1mL OMNIPAQUE IOHEXOL 300 MG/ML SOLN  COMPARISON:  03/27/2014  FINDINGS: Patchy airspace disease in the lung bases suggesting infiltration or edema. Minimal bilateral pleural effusions.  The liver, spleen, gallbladder, pancreas, adrenal glands, kidneys, abdominal aorta, and inferior vena cava are unremarkable. There is inflammatory stranding in the retroperitoneum. Retroperitoneal  lymph nodes are not pathologically enlarged and are likely reactive. Small accessory spleen. Portal veins appear patent. There are varices demonstrated in the left upper quadrant. Stomach, small bowel, and colon are not abnormally distended. Small umbilical hernia containing fat.  Pelvis: There is a large abscess in the anterior mid pelvis oriented  slightly towards the right. The abscess measures 6.5 x 10.1 by 9.4 cm. There is extensive inflammatory stranding throughout the pelvis and extending up into the retroperitoneum. Smaller adjacent loculated fluid collections are present in the periphery of the main abscess. There is inflammatory stranding and thickening involving the sigmoid colon. Likely this represents locally perforated diverticulitis with diverticular abscess. No free intraperitoneal air is noted. Small amount of free fluid in the pelvis is likely reactive. Uterus appears surgically absent. No abnormal adnexal masses. Appendix is not identified. Degenerative changes in the lumbar spine. No destructive bone lesions appreciated.  IMPRESSION: Large pelvic abscess with associated inflammatory stranding throughout the pelvis and extending into the right lower quadrant and retroperitoneum. Small amount of free fluid in the pelvis. No free intraperitoneal air. This likely results from diverticulitis with local perforation and abscess formation. Appendix is not identified and abnormal appendix is not excluded.   Electronically Signed   By: Lucienne Capers M.D.   On: 04/09/2014 22:23   Dg Abd Acute W/chest  04/07/2014   CLINICAL DATA:  Abdominal pain for 2 weeks.  Vomiting last week.  EXAM: ACUTE ABDOMEN SERIES (ABDOMEN 2 VIEW & CHEST 1 VIEW)  COMPARISON:  CT abdomen and pelvis 03/27/2014. Chest in two views abdomen 03/27/2014.  FINDINGS: Single view of the chest demonstrates cardiomegaly without edema. No pneumothorax or pleural fluid.  Two views of the abdomen show no free intraperitoneal air. The bowel gas pattern is normal. No abnormal abdominal calcification is seen.  IMPRESSION: No acute finding chest or abdomen.  Cardiomegaly.   Electronically Signed   By: Inge Rise M.D.   On: 04/07/2014 18:38   Ct Image Guided Drainage By Percutaneous Catheter  04/10/2014   CLINICAL DATA:  73 year old female with an abdominal abscess in the lower  abdomen. Abscess is likely from a perforated appendicitis.  EXAM: CT GUIDED DRAINAGE OF LOWER ABDOMINAL ABSCESS  ANESTHESIA/SEDATION: 3 mg versed, 100 mcg fentanyl. A radiology nurse monitored the patient for moderate sedation.  Total Moderate Sedation Time:  25 minutes  PROCEDURE: The procedure was explained to the patient. The risks and benefits of the procedure were discussed and the patient's questions were addressed. Informed consent was obtained from the patient. The patient was placed on the CT scanner and the right side was mildly elevated. CT images through the lower abdomen and pelvis were obtained. The right side of the abdomen was prepped and draped in sterile fashion. Skin and soft tissues were anesthetized with 1% lidocaine. An 18 gauge needle was directed towards the intra-abdominal abscess with CT guidance. Foul-smelling yellow purulent fluid was aspirated from the needle. A stiff Amplatz wire was advanced into the collection. The tract was dilated and a 10 [place] multipurpose drain was placed. 75 mL of purulent fluid was removed. Sample was sent for culture. Catheter was attached to a suction bulb and sutured to the skin.  COMPLICATIONS: None  FINDINGS: Large abscess in the lower abdomen. There is a calcification along the superior aspect of the collection which is most compatible with an appendicolith. There is wall thickening in the sigmoid colon and adjacent small bowel loops.  IMPRESSION: CT-guided drainage of the lower abdominal  abscess.   Electronically Signed   By: Markus Daft M.D.   On: 04/10/2014 17:53    Labs:  CBC:  Recent Labs  04/07/14 1535 04/09/14 1745 04/09/14 2046 04/10/14 0530 04/11/14 0515  WBC 14.5* 14.3* 11.6* 12.0* 10.0  HGB 12.4 11.1* 10.7* 10.3* 10.5*  HCT 38.5 34.8* 32.6* 31.9* 32.4*  PLT 296  --  285 292 339    COAGS:  Recent Labs  04/10/14 1215  INR 1.23    BMP:  Recent Labs  04/07/14 1535 04/09/14 1740 04/09/14 2046 04/10/14 0530  04/11/14 0515  NA 136* 137 137 139 139  K 4.1 4.1 3.8 3.7 3.4*  CL 98 100 100 101 103  CO2 25 27 22 26 25   GLUCOSE 115* 117* 115* 125* 106*  BUN 8 8 7 6  5*  CALCIUM 9.2 8.5 8.5 8.4 8.4  CREATININE 0.91 0.86 0.88 0.90 0.82  GFRNONAA 60*  --  63* 61* 68*  GFRAA 70*  --  73* 71* 79*    LIVER FUNCTION TESTS:  Recent Labs  04/07/14 1535 04/09/14 1740 04/09/14 2046 04/10/14 0530  BILITOT 0.6 0.8 0.6 0.7  AST 30 25 26 23   ALT 34 23 24 21   ALKPHOS 244* 189* 184* 170*  PROT 8.0 6.7 7.0 6.8  ALBUMIN 2.5* 2.8* 2.2* 2.2*    Assessment and Plan: Intra-abdominal/pelvic abscess S/p perc drain placed 10/21 Good output, wbc wnl, symptoms improving, Cx gram (-) rods/pending Plans per [facility]     I spent a total of 15 minutes face to face in clinical consultation/evaluation, greater than 50% of which was counseling/coordinating care  Signed: Hedy Jacob 04/11/2014, 3:52 PM

## 2014-04-12 DIAGNOSIS — A419 Sepsis, unspecified organism: Secondary | ICD-10-CM

## 2014-04-12 DIAGNOSIS — E119 Type 2 diabetes mellitus without complications: Secondary | ICD-10-CM

## 2014-04-12 DIAGNOSIS — K651 Peritoneal abscess: Secondary | ICD-10-CM

## 2014-04-12 DIAGNOSIS — I10 Essential (primary) hypertension: Secondary | ICD-10-CM

## 2014-04-12 LAB — GLUCOSE, CAPILLARY
GLUCOSE-CAPILLARY: 97 mg/dL (ref 70–99)
GLUCOSE-CAPILLARY: 173 mg/dL — AB (ref 70–99)
GLUCOSE-CAPILLARY: 122 mg/dL — AB (ref 70–99)
Glucose-Capillary: 113 mg/dL — ABNORMAL HIGH (ref 70–99)
Glucose-Capillary: 148 mg/dL — ABNORMAL HIGH (ref 70–99)
Glucose-Capillary: 164 mg/dL — ABNORMAL HIGH (ref 70–99)

## 2014-04-12 LAB — CBC
HCT: 31.2 % — ABNORMAL LOW (ref 36.0–46.0)
Hemoglobin: 9.9 g/dL — ABNORMAL LOW (ref 12.0–15.0)
MCH: 26.7 pg (ref 26.0–34.0)
MCHC: 31.7 g/dL (ref 30.0–36.0)
MCV: 84.1 fL (ref 78.0–100.0)
Platelets: 358 10*3/uL (ref 150–400)
RBC: 3.71 MIL/uL — ABNORMAL LOW (ref 3.87–5.11)
RDW: 14.5 % (ref 11.5–15.5)
WBC: 6.1 10*3/uL (ref 4.0–10.5)

## 2014-04-12 LAB — BASIC METABOLIC PANEL
Anion gap: 9 (ref 5–15)
BUN: 4 mg/dL — AB (ref 6–23)
CHLORIDE: 106 meq/L (ref 96–112)
CO2: 28 meq/L (ref 19–32)
Calcium: 8.3 mg/dL — ABNORMAL LOW (ref 8.4–10.5)
Creatinine, Ser: 0.85 mg/dL (ref 0.50–1.10)
GFR calc Af Amer: 76 mL/min — ABNORMAL LOW (ref >90)
GFR calc non Af Amer: 65 mL/min — ABNORMAL LOW (ref >90)
Glucose, Bld: 104 mg/dL — ABNORMAL HIGH (ref 70–99)
POTASSIUM: 3.7 meq/L (ref 3.7–5.3)
Sodium: 143 mEq/L (ref 137–147)

## 2014-04-12 MED ORDER — INSULIN ASPART 100 UNIT/ML ~~LOC~~ SOLN
0.0000 [IU] | Freq: Three times a day (TID) | SUBCUTANEOUS | Status: DC
  Administered 2014-04-12: 1 [IU] via SUBCUTANEOUS
  Administered 2014-04-13: 2 [IU] via SUBCUTANEOUS
  Administered 2014-04-13 – 2014-04-14 (×2): 1 [IU] via SUBCUTANEOUS
  Administered 2014-04-14: 2 [IU] via SUBCUTANEOUS
  Administered 2014-04-14: 1 [IU] via SUBCUTANEOUS
  Administered 2014-04-15: 2 [IU] via SUBCUTANEOUS
  Administered 2014-04-15 (×2): 1 [IU] via SUBCUTANEOUS
  Administered 2014-04-16 – 2014-04-17 (×2): 2 [IU] via SUBCUTANEOUS
  Administered 2014-04-17: 1 [IU] via SUBCUTANEOUS

## 2014-04-12 MED ORDER — INSULIN ASPART 100 UNIT/ML ~~LOC~~ SOLN
0.0000 [IU] | Freq: Every day | SUBCUTANEOUS | Status: DC
  Administered 2014-04-13: 1 [IU] via SUBCUTANEOUS
  Administered 2014-04-15: 2 [IU] via SUBCUTANEOUS
  Administered 2014-04-16: 3 [IU] via SUBCUTANEOUS

## 2014-04-12 NOTE — Progress Notes (Signed)
ANTIBIOTIC CONSULT NOTE   Pharmacy Consult for Vancomycin and Zosyn  Indication: Sepsis  Allergies  Allergen Reactions  . Codeine Other (See Comments)    vertigo    Patient Measurements: Height: 5' 3.75" (161.9 cm) Weight: 197 lb (89.359 kg) IBW/kg (Calculated) : 54.13 Adjusted Body Weight:   Vital Signs: Temp: 98.8 F (37.1 C) (10/23 0521) Temp Source: Oral (10/23 0521) BP: 101/81 mmHg (10/23 0521) Pulse Rate: 65 (10/23 0521) Intake/Output from previous day: 10/22 0701 - 10/23 0700 In: 960 [P.O.:960] Out: 180 [Urine:155; Drains:25] Intake/Output from this shift: Total I/O In: 240 [P.O.:240] Out: 35 [Drains:35]  Labs:  Recent Labs  04/10/14 0530 04/11/14 0515 04/12/14 0453  WBC 12.0* 10.0 6.1  HGB 10.3* 10.5* 9.9*  PLT 292 339 358  CREATININE 0.90 0.82 0.85   Estimated Creatinine Clearance: 61.6 ml/min (by C-G formula based on Cr of 0.85). No results found for this basename: VANCOTROUGH, Corlis Leak, VANCORANDOM, GENTTROUGH, GENTPEAK, GENTRANDOM, TOBRATROUGH, TOBRAPEAK, TOBRARND, AMIKACINPEAK, AMIKACINTROU, AMIKACIN,  in the last 72 hours   Microbiology: Recent Results (from the past 720 hour(s))  URINE CULTURE     Status: None   Collection Time    04/07/14  3:26 PM      Result Value Ref Range Status   Specimen Description URINE, CLEAN CATCH   Final   Special Requests NONE   Final   Culture  Setup Time     Final   Value: 04/08/2014 08:54     Performed at [place]     Final   Value: 20,OOO COLONIES/ML     Performed at [facility]   Culture     Final   Value: Multiple bacterial morphotypes present, none predominant. Suggest appropriate recollection if clinically indicated.     Performed at [facility]   Report Status 04/09/2014 FINAL   Final  URINE CULTURE     Status: None   Collection Time    04/09/14  5:40 PM      Result Value Ref Range Status   Colony Count NO GROWTH   Final   Organism ID, Bacteria NO  GROWTH   Final  CULTURE, BLOOD (ROUTINE X 2)     Status: None   Collection Time    04/09/14  8:46 PM      Result Value Ref Range Status   Specimen Description BLOOD LEFT ANTECUBITAL   Final   Special Requests BOTTLES DRAWN AEROBIC AND ANAEROBIC 5ML   Final   Culture  Setup Time     Final   Value: 04/10/2014 00:35     Performed at [facility]   Culture     Final   Value:        BLOOD CULTURE RECEIVED NO GROWTH TO DATE CULTURE WILL BE HELD FOR 5 DAYS BEFORE ISSUING A FINAL NEGATIVE REPORT     Performed at [facility]   Report Status PENDING   Incomplete  CULTURE, BLOOD (ROUTINE X 2)     Status: None   Collection Time    04/09/14  8:46 PM      Result Value Ref Range Status   Specimen Description BLOOD LEFT HAND   Final   Special Requests BOTTLES DRAWN AEROBIC AND ANAEROBIC 5ML   Final   Culture  Setup Time     Final   Value: 04/10/2014 00:35     Performed at [place]     Final  Value:        BLOOD CULTURE RECEIVED NO GROWTH TO DATE CULTURE WILL BE HELD FOR 5 DAYS BEFORE ISSUING A FINAL NEGATIVE REPORT     Performed at [facility]   Report Status PENDING   Incomplete  URINE CULTURE     Status: None   Collection Time    04/09/14  9:20 PM      Result Value Ref Range Status   Specimen Description URINE, CLEAN CATCH   Final   Special Requests NONE   Final   Culture  Setup Time     Final   Value: 04/10/2014 00:53     Performed at [place]     Final   Value: 30,000 COLONIES/ML     Performed at [facility]   Culture     Final   Value: Multiple bacterial morphotypes present, none predominant. Suggest appropriate recollection if clinically indicated.     Performed at [facility]   Report Status 04/10/2014 FINAL   Final  CULTURE, ROUTINE-ABSCESS     Status: None   Collection Time    04/10/14  5:30 PM      Result Value Ref Range Status   Specimen Description PERITONEAL CAVITY   Final    Special Requests NONE   Final   Gram Stain     Final   Value: ABUNDANT WBC PRESENT,BOTH PMN AND MONONUCLEAR     NO SQUAMOUS EPITHELIAL CELLS SEEN     ABUNDANT GRAM NEGATIVE RODS     Performed at [facility]   Culture     Final   Value: MODERATE GRAM NEGATIVE RODS     Performed at [facility]   Report Status PENDING   Incomplete   Medications:  Anti-infectives   Start     Dose/Rate Route Frequency Ordered Stop   04/10/14 1000  vancomycin (VANCOCIN) IVPB 750 mg/150 ml premix     750 mg 150 mL/hr over 60 Minutes Intravenous Every 12 hours 04/10/14 0523     04/10/14 0600  piperacillin-tazobactam (ZOSYN) IVPB 3.375 g     3.375 g 12.5 mL/hr over 240 Minutes Intravenous 3 times per day 04/10/14 0523     04/09/14 2315  vancomycin (VANCOCIN) IVPB 1000 mg/200 mL premix     1,000 mg 200 mL/hr over 60 Minutes Intravenous  Once 04/09/14 2314 04/10/14 0155   04/09/14 2230  piperacillin-tazobactam (ZOSYN) IVPB 3.375 g     3.375 g 100 mL/hr over 30 Minutes Intravenous  Once 04/09/14 2227 04/09/14 2348     Assessment: 57 YOF presents with abdominal pain and sepsis.  Source from intra-abdominal abscess s/p drain placement by IR.  Exact source unknown - diverticular and appendicitis. Currently on vancomycin and zosyn.  Abscess culture growing GNR.     04/10/2014 >> vanc >> 04/10/2014 >> zosyn >>  Tmax:Afeb WBCs:WNL Renal:SCr WNL, CrCl 67(N)  10/20 blood:NGTD 10/20 urine: multiple bacteria present, none predominat 10/20 wound: GNR  Goal of Therapy:  Vancomycin trough level 15-20 mcg/ml Zosyn based on renal function   Plan:  Day #3 vancomycin and zosyn  Based on culture results with GNR, consider stopping vancomycin   If vancomycin not stopped then trough will be indicated  Continue zosyn 3.375gm IV q8h over 4h infusion  Doreene Eland, PharmD, BCPS.   Pager: [phone]  04/12/2014,12:06 PM

## 2014-04-12 NOTE — Progress Notes (Signed)
Progress Note   Catherine Joseph LKG:[id number] DOB: [birth year] DOA: 04/09/2014 PCP: Kennon Portela, MD   Brief Narrative:   Catherine Joseph is an 73 y.o. female with a PMH of hypertension and diabetes who was admitted on 04/09/14 with a chief complaint of abdominal pain associated with fevers and chills. Upon initial evaluation, a CT scan of the abdomen showed a large pelvic abscess with associated inflammatory stranding, thought to be diverticular in origin.  Assessment/Plan:   Principal Problem:  Sepsis secondary to intra-abdominal abscess/local perforation (diverticular)   Sepsis criteria met: T 103F, hypotension, respiratory rate 28 bpm, leukocytosis 14.3.  Secondary to intraabdominal abscess as noted on CT abd/pelvis.  Pt is s/p CT guided drain placed 10/21, doing well and clinically stable (done by IR Dr. Anselm Pancoast)  Continue  Zosyn. Discontinue vancomycin given preliminary cultures growing gram-negative rods.  Follow up on blood cultures.   Active Problems:  Hypertension   Stable.  Continue PRN Hydralazine.  Resume Diovan when BP consistently up.  Diabetes mellitus, type II   Currently being managed with every 4 hour SSI. CBGs 97-164.  Change SSI to q. A.c./at bedtime.  Anemia of chronic disease   Drop in Hg since admission likely dilutional.   Monitor.  Hypokalemia   Resolved with supplementation.  DVT Prophylaxis  Continue heparin.  Code Status: Full.  Family Communication: Plan of care discussed with the patient.  Disposition Plan: Home when stable.     IV Access:    Peripheral IV   Procedures and diagnostic studies:   CXR 04/09/2014 Cardiomegaly without acute disease.   Ct Abd/Pelvis W/C 04/09/2014 Large pelvic abscess with associated inflammatory stranding throughout the pelvis and extending into the right lower quadrant and retroperitoneum. Small amount of free fluid in the pelvis. No free intraperitoneal air. This likely  results from diverticulitis with local perforation and abscess formation. Appendix is not identified and abnormal appendix is not excluded.   04/10/2014 CT-guided drainage of the lower abdominal abscess.   Medical Consultants:    Dr. Excell Seltzer, Surgery  Dr. Jacqulynn Cadet, IR  Anti-Infectives:    Vancomycin 04/09/14 --> 04/12/14  Zosyn 04/09/14-->   Subjective:   Catherine Joseph feels well, without any specific complaints. No nausea or vomiting. Reports that her bowels moved today, but that it was a small liquid stool. Some soreness at the drain site, but otherwise without significant pain. Tolerating a clear liquid diet.  Objective:    Filed Vitals:   04/11/14 1421 04/11/14 1436 04/11/14 2106 04/12/14 0521  BP: 138/78 138/73 132/71 101/81  Pulse: 64 63 71 65  Temp: 97.9 F (36.6 C) 98.6 F (37 C) 98.5 F (36.9 C) 98.8 F (37.1 C)  TempSrc: Oral Oral Oral Oral  Resp: $Remo'20 22 20 20  'qsPFY$ Height:      Weight:      SpO2: 100% 100% 100% 100%    Intake/Output Summary (Last 24 hours) at 04/12/14 0837 Last data filed at 04/12/14 0757  Gross per 24 hour  Intake    960 ml  Output    195 ml  Net    765 ml    Exam: Gen:  NAD Cardiovascular:  RRR, No M/R/G Respiratory:  Lungs CTAB Gastrointestinal:  Abdomen soft, NT/ND, + BS Extremities:  No C/E/C   Data Reviewed:    Labs: Basic Metabolic Panel:  Recent Labs Lab 04/09/14 1740 04/09/14 2046 04/10/14 0530 04/11/14 0515 04/12/14 0453  NA 137 137 139 139 143  K 4.1 3.8 3.7 3.4* 3.7  CL 100 100 101 103 106  CO2 $Re'27 22 26 25 28  'WJI$ GLUCOSE 117* 115* 125* 106* 104*  BUN $Re'8 7 6 'Twj$ 5* 4*  CREATININE 0.86 0.88 0.90 0.82 0.85  CALCIUM 8.5 8.5 8.4 8.4 8.3*   GFR Estimated Creatinine Clearance: 61.6 ml/min (by C-G formula based on Cr of 0.85). Liver Function Tests:  Recent Labs Lab 04/07/14 1535 04/09/14 1740 04/09/14 2046 04/10/14 0530  AST $Re'30 25 26 23  'GOG$ ALT 34 $Remo'23 24 21  'PPudW$ ALKPHOS 244* 189* 184* 170*    BILITOT 0.6 0.8 0.6 0.7  PROT 8.0 6.7 7.0 6.8  ALBUMIN 2.5* 2.8* 2.2* 2.2*    Recent Labs Lab 04/07/14 1535  LIPASE 27   Coagulation profile  Recent Labs Lab 04/10/14 1215  INR 1.23    CBC:  Recent Labs Lab 04/07/14 1535 04/09/14 1745 04/09/14 2046 04/10/14 0530 04/11/14 0515 04/12/14 0453  WBC 14.5* 14.3* 11.6* 12.0* 10.0 6.1  NEUTROABS 11.6*  --  9.5* 9.7*  --   --   HGB 12.4 11.1* 10.7* 10.3* 10.5* 9.9*  HCT 38.5 34.8* 32.6* 31.9* 32.4* 31.2*  MCV 84.8 84.6 83.6 83.9 84.4 84.1  PLT 296  --  285 292 339 358   CBG:  Recent Labs Lab 04/11/14 1653 04/11/14 1957 04/11/14 2332 04/12/14 0409 04/12/14 0712  GLUCAP 109* 124* 164* 97 113*   Hgb A1c:  Recent Labs  04/09/14 1753 04/09/14 2312  HGBA1C 7.1 7.6*   Sepsis Labs:  Recent Labs Lab 04/07/14 1812  04/09/14 2046 04/09/14 2225 04/10/14 0530 04/11/14 0515 04/12/14 0453  WBC  --   < > 11.6*  --  12.0* 10.0 6.1  LATICACIDVEN 2.08  --   --  0.65  --   --   --   < > = values in this interval not displayed. Microbiology Recent Results (from the past 240 hour(s))  URINE CULTURE     Status: None   Collection Time    04/07/14  3:26 PM      Result Value Ref Range Status   Specimen Description URINE, CLEAN CATCH   Final   Special Requests NONE   Final   Culture  Setup Time     Final   Value: 04/08/2014 08:54     Performed at [facility]     Final   Value: 20,OOO COLONIES/ML     Performed at [facility]   Culture     Final   Value: Multiple bacterial morphotypes present, none predominant. Suggest appropriate recollection if clinically indicated.     Performed at [facility]   Report Status 04/09/2014 FINAL   Final  URINE CULTURE     Status: None   Collection Time    04/09/14  5:40 PM      Result Value Ref Range Status   Colony Count NO GROWTH   Final   Organism ID, Bacteria NO GROWTH   Final  CULTURE, BLOOD (ROUTINE X 2)     Status: None    Collection Time    04/09/14  8:46 PM      Result Value Ref Range Status   Specimen Description BLOOD LEFT ANTECUBITAL   Final   Special Requests BOTTLES DRAWN AEROBIC AND ANAEROBIC 5ML   Final   Culture  Setup Time     Final   Value: 04/10/2014 00:35     Performed at [facility]  Culture     Final   Value:        BLOOD CULTURE RECEIVED NO GROWTH TO DATE CULTURE WILL BE HELD FOR 5 DAYS BEFORE ISSUING A FINAL NEGATIVE REPORT     Performed at [facility]   Report Status PENDING   Incomplete  CULTURE, BLOOD (ROUTINE X 2)     Status: None   Collection Time    04/09/14  8:46 PM      Result Value Ref Range Status   Specimen Description BLOOD LEFT HAND   Final   Special Requests BOTTLES DRAWN AEROBIC AND ANAEROBIC 5ML   Final   Culture  Setup Time     Final   Value: 04/10/2014 00:35     Performed at [facility]   Culture     Final   Value:        BLOOD CULTURE RECEIVED NO GROWTH TO DATE CULTURE WILL BE HELD FOR 5 DAYS BEFORE ISSUING A FINAL NEGATIVE REPORT     Performed at [facility]   Report Status PENDING   Incomplete  URINE CULTURE     Status: None   Collection Time    04/09/14  9:20 PM      Result Value Ref Range Status   Specimen Description URINE, CLEAN CATCH   Final   Special Requests NONE   Final   Culture  Setup Time     Final   Value: 04/10/2014 00:53     Performed at [facility]     Final   Value: 30,000 COLONIES/ML     Performed at [facility]   Culture     Final   Value: Multiple bacterial morphotypes present, none predominant. Suggest appropriate recollection if clinically indicated.     Performed at [facility]   Report Status 04/10/2014 FINAL   Final  CULTURE, ROUTINE-ABSCESS     Status: None   Collection Time    04/10/14  5:30 PM      Result Value Ref Range Status   Specimen Description PERITONEAL CAVITY   Final   Special Requests NONE   Final   Gram Stain     Final   Value:  ABUNDANT WBC PRESENT,BOTH PMN AND MONONUCLEAR     NO SQUAMOUS EPITHELIAL CELLS SEEN     ABUNDANT GRAM NEGATIVE RODS     Performed at [facility]   Culture     Final   Value: MODERATE GRAM NEGATIVE RODS     Performed at [facility]   Report Status PENDING   Incomplete     Medications:   . heparin  5,000 Units Subcutaneous 3 times per day  . insulin aspart  0-9 Units Subcutaneous 6 times per day  . piperacillin-tazobactam (ZOSYN)  IV  3.375 g Intravenous 3 times per day  . vancomycin  750 mg Intravenous Q12H   Continuous Infusions: . sodium chloride 75 mL/hr at 04/11/14 1411    Time spent: 25 minutes.   LOS: 3 days   [facility] Pager [phone]. If unable to reach me by pager, please call my cell phone at [phone].  *Please refer to [email or web address], password [email or web address] to get updated schedule on who will round on this patient, as hospitalists switch teams weekly. If 7PM-7AM, please contact night-coverage at [email or web address], password [email or web address] for any overnight needs.  04/12/2014, 8:37 AM

## 2014-04-12 NOTE — Progress Notes (Signed)
Patient ID: Catherine Joseph, female   DOB: 11/20/1940, 73 y.o.   MRN: [id number]   Referring Physician(s): CCS  Subjective:  Pt feeling a little drowsy this am; wants to eat; denies N/V; has had small liquid stool; mild soreness at right pelvic drain site  Allergies: Codeine  Medications: Prior to Admission medications   Medication Sig Start Date End Date Taking? Authorizing Provider  flecainide (TAMBOCOR) 50 MG tablet Take 1 tablet (50 mg total) by mouth 2 (two) times daily. 04/05/14  Yes Josue Hector, MD  glipiZIDE (GLUCOTROL) 5 MG tablet Take 5 mg by mouth daily before breakfast.   Yes Historical Provider, MD  ibuprofen (ADVIL,MOTRIN) 200 MG tablet Take 400 mg by mouth every 6 (six) hours as needed for moderate pain.   Yes Historical Provider, MD  simvastatin (ZOCOR) 20 MG tablet Take 20 mg by mouth daily.   Yes Historical Provider, MD  valsartan-hydrochlorothiazide (DIOVAN-HCT) 160-25 MG per tablet Take 1 tablet by mouth daily.   Yes Historical Provider, MD  ondansetron (ZOFRAN) 4 MG tablet Take 1 tablet (4 mg total) by mouth every 8 (eight) hours as needed for nausea or vomiting. 04/07/14   Margarita Mail, PA-C  traMADol (ULTRAM) 50 MG tablet Take 1 tablet (50 mg total) by mouth every 6 (six) hours as needed. 04/07/14   Margarita Mail, PA-C    Review of Systems  see above  Vital Signs: BP 101/81  Pulse 65  Temp(Src) 98.8 F (37.1 C) (Oral)  Resp 20  Ht 5' 3.75" (1.619 m)  Wt 197 lb (89.359 kg)  BMI 34.09 kg/m2  SpO2 100%  Physical Exam pt awake/alert; RLQ drain intact, insertion site ok, mildly tender; output 15 cc's reddish-beige fluid; cx's pend - gram neg rods; drain flushed with 10 cc's sterile NS without difficulty  Imaging: Dg Chest 2 View  04/09/2014   CLINICAL DATA:  Cough for 2 weeks.  EXAM: CHEST  2 VIEW  COMPARISON:  Single view of the chest 04/07/2014 and 03/27/2014. CT chest 02/26/2013.  FINDINGS: There is cardiomegaly without edema. No pneumothorax or  pleural effusion.  IMPRESSION: Cardiomegaly without acute disease.   Electronically Signed   By: Inge Rise M.D.   On: 04/09/2014 18:04   Ct Abdomen Pelvis W Contrast  04/09/2014   CLINICAL DATA:  Progressive lower pelvic pain with nausea for 2 weeks. Fever.  EXAM: CT ABDOMEN AND PELVIS WITH CONTRAST  TECHNIQUE: Multidetector CT imaging of the abdomen and pelvis was performed using the standard protocol following bolus administration of intravenous contrast.  CONTRAST:  112mL OMNIPAQUE IOHEXOL 300 MG/ML SOLN, 61mL OMNIPAQUE IOHEXOL 300 MG/ML SOLN  COMPARISON:  03/27/2014  FINDINGS: Patchy airspace disease in the lung bases suggesting infiltration or edema. Minimal bilateral pleural effusions.  The liver, spleen, gallbladder, pancreas, adrenal glands, kidneys, abdominal aorta, and inferior vena cava are unremarkable. There is inflammatory stranding in the retroperitoneum. Retroperitoneal lymph nodes are not pathologically enlarged and are likely reactive. Small accessory spleen. Portal veins appear patent. There are varices demonstrated in the left upper quadrant. Stomach, small bowel, and colon are not abnormally distended. Small umbilical hernia containing fat.  Pelvis: There is a large abscess in the anterior mid pelvis oriented slightly towards the right. The abscess measures 6.5 x 10.1 by 9.4 cm. There is extensive inflammatory stranding throughout the pelvis and extending up into the retroperitoneum. Smaller adjacent loculated fluid collections are present in the periphery of the main abscess. There is inflammatory stranding and thickening involving the  sigmoid colon. Likely this represents locally perforated diverticulitis with diverticular abscess. No free intraperitoneal air is noted. Small amount of free fluid in the pelvis is likely reactive. Uterus appears surgically absent. No abnormal adnexal masses. Appendix is not identified. Degenerative changes in the lumbar spine. No destructive bone  lesions appreciated.  IMPRESSION: Large pelvic abscess with associated inflammatory stranding throughout the pelvis and extending into the right lower quadrant and retroperitoneum. Small amount of free fluid in the pelvis. No free intraperitoneal air. This likely results from diverticulitis with local perforation and abscess formation. Appendix is not identified and abnormal appendix is not excluded.   Electronically Signed   By: Lucienne Capers M.D.   On: 04/09/2014 22:23   Ct Image Guided Drainage By Percutaneous Catheter  04/10/2014   CLINICAL DATA:  73 year old female with an abdominal abscess in the lower abdomen. Abscess is likely from a perforated appendicitis.  EXAM: CT GUIDED DRAINAGE OF LOWER ABDOMINAL ABSCESS  ANESTHESIA/SEDATION: 3 mg versed, 100 mcg fentanyl. A radiology nurse monitored the patient for moderate sedation.  Total Moderate Sedation Time:  25 minutes  PROCEDURE: The procedure was explained to the patient. The risks and benefits of the procedure were discussed and the patient's questions were addressed. Informed consent was obtained from the patient. The patient was placed on the CT scanner and the right side was mildly elevated. CT images through the lower abdomen and pelvis were obtained. The right side of the abdomen was prepped and draped in sterile fashion. Skin and soft tissues were anesthetized with 1% lidocaine. An 18 gauge needle was directed towards the intra-abdominal abscess with CT guidance. Foul-smelling yellow purulent fluid was aspirated from the needle. A stiff Amplatz wire was advanced into the collection. The tract was dilated and a 10 [place] multipurpose drain was placed. 75 mL of purulent fluid was removed. Sample was sent for culture. Catheter was attached to a suction bulb and sutured to the skin.  COMPLICATIONS: None  FINDINGS: Large abscess in the lower abdomen. There is a calcification along the superior aspect of the collection which is most compatible with an  appendicolith. There is wall thickening in the sigmoid colon and adjacent small bowel loops.  IMPRESSION: CT-guided drainage of the lower abdominal abscess.   Electronically Signed   By: Markus Daft M.D.   On: 04/10/2014 17:53    Labs:  CBC:  Recent Labs  04/09/14 2046 04/10/14 0530 04/11/14 0515 04/12/14 0453  WBC 11.6* 12.0* 10.0 6.1  HGB 10.7* 10.3* 10.5* 9.9*  HCT 32.6* 31.9* 32.4* 31.2*  PLT 285 292 339 358    COAGS:  Recent Labs  04/10/14 1215  INR 1.23    BMP:  Recent Labs  04/09/14 2046 04/10/14 0530 04/11/14 0515 04/12/14 0453  NA 137 139 139 143  K 3.8 3.7 3.4* 3.7  CL 100 101 103 106  CO2 22 26 25 28   GLUCOSE 115* 125* 106* 104*  BUN 7 6 5* 4*  CALCIUM 8.5 8.4 8.4 8.3*  CREATININE 0.88 0.90 0.82 0.85  GFRNONAA 63* 61* 68* 65*  GFRAA 73* 71* 79* 76*    LIVER FUNCTION TESTS:  Recent Labs  04/07/14 1535 04/09/14 1740 04/09/14 2046 04/10/14 0530  BILITOT 0.6 0.8 0.6 0.7  AST 30 25 26 23   ALT 34 23 24 21   ALKPHOS 244* 189* 184* 170*  PROT 8.0 6.7 7.0 6.8  ALBUMIN 2.5* 2.8* 2.2* 2.2*    Assessment and Plan: S/p RLQ/?appendiceal abscess drainage 10/21; check final  cx's; WBC nl; cont current tx/antbx ; ambulate; check f/u CT with drain injection next week; diet per CCS.      I spent a total of 15 minutes face to face in clinical consultation/evaluation, greater than 50% of which was counseling/coordinating care for RLQ abscess drain.  SignedAutumn Messing 04/12/2014, 8:54 AM

## 2014-04-12 NOTE — Progress Notes (Signed)
Patient ID: Catherine Joseph, female   DOB: 1940-11-06, 73 y.o.   MRN: [id number]     [facility]      [address] [place], [place] [address]    Phone: [phone] FAX: [phone]     Subjective: Afebrile.  VSS.  Normal white count.  Tolerating clears.  Ambulating.  No n/v.  Passing flatus.  No diarrhea over last 35h.  Objective:  Vital signs:  Filed Vitals:   04/11/14 1421 04/11/14 1436 04/11/14 2106 04/12/14 0521  BP: 138/78 138/73 132/71 101/81  Pulse: 64 63 71 65  Temp: 97.9 F (36.6 C) 98.6 F (37 C) 98.5 F (36.9 C) 98.8 F (37.1 C)  TempSrc: Oral Oral Oral Oral  Resp: $Remo'20 22 20 20  'LQQvS$ Height:      Weight:      SpO2: 100% 100% 100% 100%    Last BM Date: 04/11/14  Intake/Output   Yesterday:  10/22 0701 - 10/23 0700 In: 34 [P.O.:960] Out: 180 [Urine:155; Drains:25] This shift:  Total I/O In: -  Out: 15 [Drains:15]  Bowel function:  Drain: RLQ 51ml/24h  Physical Exam: General: Pt awake/alert/oriented x4 in no acute distress Abdomen: Soft.  Nondistended.  Minimal TTP RLQ, drain with purulent output. .  No evidence of peritonitis.  No incarcerated hernias.    Problem List:   Principal Problem:   Sepsis Active Problems:   Hypertension   Diabetes mellitus, type II   Intra-abdominal abscess   Anemia of chronic disease    Results:   Labs: Results for orders placed during the hospital encounter of 04/09/14 (from the past 48 hour(s))  PROTIME-INR     Status: Abnormal   Collection Time    04/10/14 12:15 PM      Result Value Ref Range   Prothrombin Time 15.6 (*) 11.6 - 15.2 seconds   INR 1.23  0.00 - 1.49  GLUCOSE, CAPILLARY     Status: Abnormal   Collection Time    04/10/14 12:17 PM      Result Value Ref Range   Glucose-Capillary 126 (*) 70 - 99 mg/dL   Comment 1 Documented in Chart     Comment 2 Notify RN    CULTURE, ROUTINE-ABSCESS     Status: None   Collection Time    04/10/14  5:30 PM       Result Value Ref Range   Specimen Description PERITONEAL CAVITY     Special Requests NONE     Gram Stain       Value: ABUNDANT WBC PRESENT,BOTH PMN AND MONONUCLEAR     NO SQUAMOUS EPITHELIAL CELLS SEEN     ABUNDANT GRAM NEGATIVE RODS     Performed at [facility]   Culture       Value: MODERATE [place]     Performed at [facility]   Report Status PENDING    GLUCOSE, CAPILLARY     Status: None   Collection Time    04/10/14  6:03 PM      Result Value Ref Range   Glucose-Capillary 84  70 - 99 mg/dL   Comment 1 Documented in Chart     Comment 2 Notify RN    GLUCOSE, CAPILLARY     Status: Abnormal   Collection Time    04/10/14  7:48 PM      Result Value Ref Range   Glucose-Capillary 176 (*) 70 - 99 mg/dL  GLUCOSE, CAPILLARY  Status: Abnormal   Collection Time    04/11/14 12:09 AM      Result Value Ref Range   Glucose-Capillary 160 (*) 70 - 99 mg/dL  GLUCOSE, CAPILLARY     Status: Abnormal   Collection Time    04/11/14  3:57 AM      Result Value Ref Range   Glucose-Capillary 127 (*) 70 - 99 mg/dL  CBC     Status: Abnormal   Collection Time    04/11/14  5:15 AM      Result Value Ref Range   WBC 10.0  4.0 - 10.5 K/uL   RBC 3.84 (*) 3.87 - 5.11 MIL/uL   Hemoglobin 10.5 (*) 12.0 - 15.0 g/dL   HCT 32.4 (*) 36.0 - 46.0 %   MCV 84.4  78.0 - 100.0 fL   MCH 27.3  26.0 - 34.0 pg   MCHC 32.4  30.0 - 36.0 g/dL   RDW 14.5  11.5 - 15.5 %   Platelets 339  150 - 400 K/uL  BASIC METABOLIC PANEL     Status: Abnormal   Collection Time    04/11/14  5:15 AM      Result Value Ref Range   Sodium 139  137 - 147 mEq/L   Potassium 3.4 (*) 3.7 - 5.3 mEq/L   Chloride 103  96 - 112 mEq/L   CO2 25  19 - 32 mEq/L   Glucose, Bld 106 (*) 70 - 99 mg/dL   BUN 5 (*) 6 - 23 mg/dL   Creatinine, Ser 0.82  0.50 - 1.10 mg/dL   Calcium 8.4  8.4 - 10.5 mg/dL   GFR calc non Af Amer 68 (*) >90 mL/min   GFR calc Af Amer 79 (*) >90 mL/min   Comment: (NOTE)     The eGFR  has been calculated using the CKD EPI equation.     This calculation has not been validated in all clinical situations.     eGFR's persistently <90 mL/min signify possible Chronic Kidney     Disease.   Anion gap 11  5 - 15  GLUCOSE, CAPILLARY     Status: Abnormal   Collection Time    04/11/14  7:50 AM      Result Value Ref Range   Glucose-Capillary 108 (*) 70 - 99 mg/dL  GLUCOSE, CAPILLARY     Status: Abnormal   Collection Time    04/11/14 11:53 AM      Result Value Ref Range   Glucose-Capillary 208 (*) 70 - 99 mg/dL  GLUCOSE, CAPILLARY     Status: Abnormal   Collection Time    04/11/14  4:53 PM      Result Value Ref Range   Glucose-Capillary 109 (*) 70 - 99 mg/dL  GLUCOSE, CAPILLARY     Status: Abnormal   Collection Time    04/11/14  7:57 PM      Result Value Ref Range   Glucose-Capillary 124 (*) 70 - 99 mg/dL  GLUCOSE, CAPILLARY     Status: Abnormal   Collection Time    04/11/14 11:32 PM      Result Value Ref Range   Glucose-Capillary 164 (*) 70 - 99 mg/dL  GLUCOSE, CAPILLARY     Status: None   Collection Time    04/12/14  4:09 AM      Result Value Ref Range   Glucose-Capillary 97  70 - 99 mg/dL  CBC     Status: Abnormal   Collection Time  04/12/14  4:53 AM      Result Value Ref Range   WBC 6.1  4.0 - 10.5 K/uL   RBC 3.71 (*) 3.87 - 5.11 MIL/uL   Hemoglobin 9.9 (*) 12.0 - 15.0 g/dL   HCT 31.2 (*) 36.0 - 46.0 %   MCV 84.1  78.0 - 100.0 fL   MCH 26.7  26.0 - 34.0 pg   MCHC 31.7  30.0 - 36.0 g/dL   RDW 14.5  11.5 - 15.5 %   Platelets 358  150 - 400 K/uL  BASIC METABOLIC PANEL     Status: Abnormal   Collection Time    04/12/14  4:53 AM      Result Value Ref Range   Sodium 143  137 - 147 mEq/L   Potassium 3.7  3.7 - 5.3 mEq/L   Chloride 106  96 - 112 mEq/L   CO2 28  19 - 32 mEq/L   Glucose, Bld 104 (*) 70 - 99 mg/dL   BUN 4 (*) 6 - 23 mg/dL   Creatinine, Ser 0.85  0.50 - 1.10 mg/dL   Calcium 8.3 (*) 8.4 - 10.5 mg/dL   GFR calc non Af Amer 65 (*) >90 mL/min    GFR calc Af Amer 76 (*) >90 mL/min   Comment: (NOTE)     The eGFR has been calculated using the CKD EPI equation.     This calculation has not been validated in all clinical situations.     eGFR's persistently <90 mL/min signify possible Chronic Kidney     Disease.   Anion gap 9  5 - 15  GLUCOSE, CAPILLARY     Status: Abnormal   Collection Time    04/12/14  7:12 AM      Result Value Ref Range   Glucose-Capillary 113 (*) 70 - 99 mg/dL   Comment 1 Notify RN      Imaging / Studies: Ct Image Guided Drainage By Percutaneous Catheter  04/10/2014   CLINICAL DATA:  73 year old female with an abdominal abscess in the lower abdomen. Abscess is likely from a perforated appendicitis.  EXAM: CT GUIDED DRAINAGE OF LOWER ABDOMINAL ABSCESS  ANESTHESIA/SEDATION: 3 mg versed, 100 mcg fentanyl. A radiology nurse monitored the patient for moderate sedation.  Total Moderate Sedation Time:  25 minutes  PROCEDURE: The procedure was explained to the patient. The risks and benefits of the procedure were discussed and the patient's questions were addressed. Informed consent was obtained from the patient. The patient was placed on the CT scanner and the right side was mildly elevated. CT images through the lower abdomen and pelvis were obtained. The right side of the abdomen was prepped and draped in sterile fashion. Skin and soft tissues were anesthetized with 1% lidocaine. An 18 gauge needle was directed towards the intra-abdominal abscess with CT guidance. Foul-smelling yellow purulent fluid was aspirated from the needle. A stiff Amplatz wire was advanced into the collection. The tract was dilated and a 10 [place] multipurpose drain was placed. 75 mL of purulent fluid was removed. Sample was sent for culture. Catheter was attached to a suction bulb and sutured to the skin.  COMPLICATIONS: None  FINDINGS: Large abscess in the lower abdomen. There is a calcification along the superior aspect of the collection which is  most compatible with an appendicolith. There is wall thickening in the sigmoid colon and adjacent small bowel loops.  IMPRESSION: CT-guided drainage of the lower abdominal abscess.   Electronically Signed   By: Quita Skye  Anselm Pancoast M.D.   On: 04/10/2014 17:53    Medications / Allergies:  Scheduled Meds: . heparin  5,000 Units Subcutaneous 3 times per day  . insulin aspart  0-9 Units Subcutaneous 6 times per day  . piperacillin-tazobactam (ZOSYN)  IV  3.375 g Intravenous 3 times per day  . vancomycin  750 mg Intravenous Q12H   Continuous Infusions: . sodium chloride 75 mL/hr at 04/11/14 1411   PRN Meds:.acetaminophen, guaiFENesin-dextromethorphan, hydrALAZINE  Antibiotics: Anti-infectives   Start     Dose/Rate Route Frequency Ordered Stop   04/10/14 1000  vancomycin (VANCOCIN) IVPB 750 mg/150 ml premix     750 mg 150 mL/hr over 60 Minutes Intravenous Every 12 hours 04/10/14 0523     04/10/14 0600  piperacillin-tazobactam (ZOSYN) IVPB 3.375 g     3.375 g 12.5 mL/hr over 240 Minutes Intravenous 3 times per day 04/10/14 0523     04/09/14 2315  vancomycin (VANCOCIN) IVPB 1000 mg/200 mL premix     1,000 mg 200 mL/hr over 60 Minutes Intravenous  Once 04/09/14 2314 04/10/14 0155   04/09/14 2230  piperacillin-tazobactam (ZOSYN) IVPB 3.375 g     3.375 g 100 mL/hr over 30 Minutes Intravenous  Once 04/09/14 2227 04/09/14 2348        Assessment/Plan Sepsis  Intra-abdominal abscess, unclear whether from diverticulitis or appendicitis -Afebrile, benign exam, WBC normal. Tolerating clears, advance to fulls -continue drain care -culture GNR -ontinue with Zosyn -consider stopping Vanc -will repeat CT next week unless she has worsening s&s -ambulate -will need a colonoscopy in the future  Erby Pian, ANP-BC [facility] Pager [phone]([id number]:30P)   04/12/2014 9:37 AM

## 2014-04-12 NOTE — Progress Notes (Signed)
Patient interviewed and examined, agree with NP note above.  Edward Jolly MD, FACS  04/12/2014 3:27 PM

## 2014-04-13 DIAGNOSIS — K5700 Diverticulitis of small intestine with perforation and abscess without bleeding: Secondary | ICD-10-CM

## 2014-04-13 DIAGNOSIS — A4151 Sepsis due to Escherichia coli [E. coli]: Principal | ICD-10-CM

## 2014-04-13 DIAGNOSIS — E782 Mixed hyperlipidemia: Secondary | ICD-10-CM

## 2014-04-13 LAB — GLUCOSE, CAPILLARY
GLUCOSE-CAPILLARY: 147 mg/dL — AB (ref 70–99)
Glucose-Capillary: 134 mg/dL — ABNORMAL HIGH (ref 70–99)
Glucose-Capillary: 167 mg/dL — ABNORMAL HIGH (ref 70–99)
Glucose-Capillary: 148 mg/dL — ABNORMAL HIGH (ref 70–99)

## 2014-04-13 LAB — CULTURE, ROUTINE-ABSCESS

## 2014-04-13 MED ORDER — FLECAINIDE ACETATE 50 MG PO TABS
50.0000 mg | ORAL_TABLET | Freq: Two times a day (BID) | ORAL | Status: DC
  Administered 2014-04-13 – 2014-04-17 (×8): 50 mg via ORAL
  Filled 2014-04-13 (×9): qty 1

## 2014-04-13 MED ORDER — SIMVASTATIN 20 MG PO TABS
20.0000 mg | ORAL_TABLET | Freq: Every day | ORAL | Status: DC
  Administered 2014-04-13 – 2014-04-17 (×5): 20 mg via ORAL
  Filled 2014-04-13 (×5): qty 1

## 2014-04-13 MED ORDER — IRBESARTAN 150 MG PO TABS
150.0000 mg | ORAL_TABLET | Freq: Every day | ORAL | Status: DC
  Administered 2014-04-13 – 2014-04-17 (×5): 150 mg via ORAL
  Filled 2014-04-13 (×5): qty 1

## 2014-04-13 MED ORDER — SODIUM CHLORIDE 0.9 % IV SOLN
3.0000 g | Freq: Four times a day (QID) | INTRAVENOUS | Status: DC
  Administered 2014-04-13 – 2014-04-14 (×5): 3 g via INTRAVENOUS
  Filled 2014-04-13 (×5): qty 3

## 2014-04-13 NOTE — Progress Notes (Signed)
Progress Note   [name] TGY:[id number] DOB: 1941/03/29 DOA: 04/09/2014 PCP: Kennon Portela, MD   Brief Narrative:   Catherine Joseph is an 73 y.o. female with a PMH of hypertension and diabetes who was admitted on 04/09/14 with a chief complaint of abdominal pain associated with fevers and chills. Upon initial evaluation, a CT scan of the abdomen showed a large pelvic abscess with associated inflammatory stranding, thought to be diverticular in origin.  Assessment/Plan:   Principal Problem:  Sepsis secondary to intra-abdominal abscess/local perforation (diverticular)   Sepsis criteria met: T 103F, hypotension, respiratory rate 28 bpm, leukocytosis 14.3.  Secondary to intraabdominal abscess as noted on CT abd/pelvis, with plans for repeat imaging next week per surgery.  Pt is s/p CT guided drain placed 10/21, doing well and clinically stable (done by IR Dr. Anselm Pancoast)  Initially treated with vancomycin and Zosyn. Vancomycin discontinued 04/12/14 after preliminary cultures grew GNR.  Cultures now positive for Escherichia coli, broadly sensitive, we'll narrow antibiotics to Unasyn.  Follow up on blood cultures, which remain negative to date.   Active Problems:  Hypertension   Stable.  Continue PRN Hydralazine.  Resume Diovan and flecainide.  Diabetes mellitus, type II   Currently being managed with SSI q. a.c. and at bedtime. CBGs 113-173.  Anemia of chronic disease   Drop in Hg since admission likely dilutional.   Monitor.  Hypokalemia   Resolved with supplementation.  DVT Prophylaxis  Continue heparin.  Code Status: Full.  Family Communication: Plan of care discussed with the patient.  Disposition Plan: Home when stable.     IV Access:    Peripheral IV   Procedures and diagnostic studies:   CXR 04/09/2014 Cardiomegaly without acute disease.   Ct Abd/Pelvis W/C 04/09/2014 Large pelvic abscess with associated inflammatory stranding  throughout the pelvis and extending into the right lower quadrant and retroperitoneum. Small amount of free fluid in the pelvis. No free intraperitoneal air. This likely results from diverticulitis with local perforation and abscess formation. Appendix is not identified and abnormal appendix is not excluded.   04/10/2014 CT-guided drainage of the lower abdominal abscess.   Medical Consultants:    Dr. Excell Seltzer, Surgery  Dr. Jacqulynn Cadet, IR  Anti-Infectives:    Vancomycin 04/09/14 --> 04/12/14  Zosyn 04/09/14-->   Subjective:   Catherine Joseph continues to feel well, without any specific complaints. No nausea or vomiting. Some mild pain at the drain site, but otherwise no significant pain. Tolerating a full liquid diet. Diet subsequently advanced to soft by surgery today.  Objective:    Filed Vitals:   04/12/14 0521 04/12/14 1326 04/12/14 2228 04/13/14 0621  BP: 101/81 133/66 161/76 163/86  Pulse: 65 62 61 60  Temp: 98.8 F (37.1 C) 98.4 F (36.9 C) 98.5 F (36.9 C) 98.5 F (36.9 C)  TempSrc: Oral Oral Oral Oral  Resp: _0 Height:      Weight:      SpO2: 100% 100% 98% 100%    Intake/Output Summary (Last 24 hours) at 04/13/14 2876 Last data filed at 04/13/14 0700  Gross per 24 hour  Intake    695 ml  Output     26 ml  Net    669 ml    Exam: Gen:  NAD Cardiovascular:  RRR, No M/R/G Respiratory:  Lungs CTAB Gastrointestinal:  Abdomen soft, mildly tender at the drain site, + BS Extremities:  No C/E/C   Data Reviewed:  Labs: Basic Metabolic Panel:  Recent Labs Lab 04/09/14 1740 04/09/14 2046 04/10/14 0530 04/11/14 0515 04/12/14 0453  NA 137 137 139 139 143  K 4.1 3.8 3.7 3.4* 3.7  CL 100 100 101 103 106  CO2 _0 GLUCOSE 117* 115* 125* 106* 104*  BUN _1 5* 4*  CREATININE 0.86 0.88 0.90 0.82 0.85  CALCIUM 8.5 8.5 8.4 8.4 8.3*   GFR Estimated Creatinine Clearance: 61.6 ml/min (by C-G formula based on Cr  of 0.85). Liver Function Tests:  Recent Labs Lab 04/07/14 1535 04/09/14 1740 04/09/14 2046 04/10/14 0530  AST _2 ALT 34 _3 ALKPHOS 244* 189* 184* 170*  BILITOT 0.6 0.8 0.6 0.7  PROT 8.0 6.7 7.0 6.8  ALBUMIN 2.5* 2.8* 2.2* 2.2*    Recent Labs Lab 04/07/14 1535  LIPASE 27   Coagulation profile  Recent Labs Lab 04/10/14 1215  INR 1.23    CBC:  Recent Labs Lab 04/07/14 1535 04/09/14 1745 04/09/14 2046 04/10/14 0530 04/11/14 0515 04/12/14 0453  WBC 14.5* 14.3* 11.6* 12.0* 10.0 6.1  NEUTROABS 11.6*  --  9.5* 9.7*  --   --   HGB 12.4 11.1* 10.7* 10.3* 10.5* 9.9*  HCT 38.5 34.8* 32.6* 31.9* 32.4* 31.2*  MCV 84.8 84.6 83.6 83.9 84.4 84.1  PLT 296  --  285 292 339 358   CBG:  Recent Labs Lab 04/12/14 0712 04/12/14 1133 04/12/14 1700 04/12/14 2220 04/13/14 0806  GLUCAP 113* 173* 122* 148* 134*   Hgb A1c: No results found for this basename: HGBA1C,  in the last 72 hours Sepsis Labs:  Recent Labs Lab 04/07/14 1812  04/09/14 2046 04/09/14 2225 04/10/14 0530 04/11/14 0515 04/12/14 0453  WBC  --   < > 11.6*  --  12.0* 10.0 6.1  LATICACIDVEN 2.08  --   --  0.65  --   --   --   < > = values in this interval not displayed. Microbiology Recent Results (from the past 240 hour(s))  URINE CULTURE     Status: None   Collection Time    04/07/14  3:26 PM      Result Value Ref Range Status   Specimen Description URINE, CLEAN CATCH   Final   Special Requests NONE   Final   Culture  Setup Time     Final   Value: 04/08/2014 08:54     Performed at [facility]     Final   Value: 20,OOO COLONIES/ML     Performed at [facility]   Culture     Final   Value: Multiple bacterial morphotypes present, none predominant. Suggest appropriate recollection if clinically indicated.     Performed at [facility]   Report Status 04/09/2014 FINAL   Final  URINE CULTURE     Status: None   Collection Time    04/09/14   5:40 PM      Result Value Ref Range Status   Colony Count NO GROWTH   Final   Organism ID, Bacteria NO GROWTH   Final  CULTURE, BLOOD (ROUTINE X 2)     Status: None   Collection Time    04/09/14  8:46 PM      Result Value Ref Range Status   Specimen Description BLOOD LEFT ANTECUBITAL   Final   Special Requests BOTTLES DRAWN AEROBIC AND ANAEROBIC 5ML   Final   Culture  Setup Time     Final   Value: 04/10/2014 00:35     Performed at [facility]   Culture     Final   Value:        BLOOD CULTURE RECEIVED NO GROWTH TO DATE CULTURE WILL BE HELD FOR 5 DAYS BEFORE ISSUING A FINAL NEGATIVE REPORT     Performed at [facility]   Report Status PENDING   Incomplete  CULTURE, BLOOD (ROUTINE X 2)     Status: None   Collection Time    04/09/14  8:46 PM      Result Value Ref Range Status   Specimen Description BLOOD LEFT HAND   Final   Special Requests BOTTLES DRAWN AEROBIC AND ANAEROBIC 5ML   Final   Culture  Setup Time     Final   Value: 04/10/2014 00:35     Performed at [facility]   Culture     Final   Value:        BLOOD CULTURE RECEIVED NO GROWTH TO DATE CULTURE WILL BE HELD FOR 5 DAYS BEFORE ISSUING A FINAL NEGATIVE REPORT     Performed at [facility]   Report Status PENDING   Incomplete  URINE CULTURE     Status: None   Collection Time    04/09/14  9:20 PM      Result Value Ref Range Status   Specimen Description URINE, CLEAN CATCH   Final   Special Requests NONE   Final   Culture  Setup Time     Final   Value: 04/10/2014 00:53     Performed at [facility]     Final   Value: 30,000 COLONIES/ML     Performed at [facility]   Culture     Final   Value: Multiple bacterial morphotypes present, none predominant. Suggest appropriate recollection if clinically indicated.     Performed at [facility]   Report Status 04/10/2014 FINAL   Final  CULTURE, ROUTINE-ABSCESS     Status: None   Collection Time     04/10/14  5:30 PM      Result Value Ref Range Status   Specimen Description PERITONEAL CAVITY   Final   Special Requests NONE   Final   Gram Stain     Final   Value: ABUNDANT WBC PRESENT,BOTH PMN AND MONONUCLEAR     NO SQUAMOUS EPITHELIAL CELLS SEEN     ABUNDANT GRAM NEGATIVE RODS     Performed at [facility]   Culture     Final   Value: MODERATE ESCHERICHIA COLI     Performed at [facility]   Report Status 04/13/2014 FINAL   Final   Organism ID, Bacteria ESCHERICHIA COLI   Final     Medications:   . heparin  5,000 Units Subcutaneous 3 times per day  . insulin aspart  0-5 Units Subcutaneous QHS  . insulin aspart  0-9 Units Subcutaneous TID WC  . piperacillin-tazobactam (ZOSYN)  IV  3.375 g Intravenous 3 times per day   Continuous Infusions: . sodium chloride 75 mL/hr at 04/11/14 1411    Time spent: 25 minutes.   LOS: 4 days   [facility] Pager [phone]. If unable to reach me by pager, please call my cell phone at [phone].  *Please refer to [email or web address], password [email or web address] to get updated schedule on who will round on this patient, as hospitalists switch  teams weekly. If 7PM-7AM, please contact night-coverage at [email or web address], password [email or web address] for any overnight needs.  04/13/2014, 8:26 AM

## 2014-04-13 NOTE — Progress Notes (Signed)
  Subjective: Pt doing well.  tol PO  Objective: Vital signs in last 24 hours: Temp:  [98.4 F (36.9 C)-98.5 F (36.9 C)] 98.5 F (36.9 C) (10/24 0621) Pulse Rate:  [60-62] 60 (10/24 0621) Resp:  [18] 18 (10/24 0621) BP: (133-163)/(66-86) 163/86 mmHg (10/24 0621) SpO2:  [98 %-100 %] 100 % (10/24 0621) Last BM Date: 04/11/14  Intake/Output from previous day: 10/23 0701 - 10/24 0700 In: 695 [P.O.:240; IV Piggyback:450] Out: 61 [Drains:60; Stool:1] Intake/Output this shift:    General appearance: alert and cooperative Resp: clear to auscultation bilaterally Cardio: regular rate and rhythm, S1, S2 normal, no murmur, click, rub or gallop GI: soft, approp ttp, nd  active BS  Lab Results:   Recent Labs  04/11/14 0515 04/12/14 0453  WBC 10.0 6.1  HGB 10.5* 9.9*  HCT 32.4* 31.2*  PLT 339 358   BMET  Recent Labs  04/11/14 0515 04/12/14 0453  NA 139 143  K 3.4* 3.7  CL 103 106  CO2 25 28  GLUCOSE 106* 104*  BUN 5* 4*  CREATININE 0.82 0.85  CALCIUM 8.4 8.3*   PT/INR  Recent Labs  04/10/14 1215  LABPROT 15.6*  INR 1.23   ABG No results found for this basename: PHART, PCO2, PO2, HCO3,  in the last 72 hours  Studies/Results: No results found.  Anti-infectives: Anti-infectives   Start     Dose/Rate Route Frequency Ordered Stop   04/10/14 1000  vancomycin (VANCOCIN) IVPB 750 mg/150 ml premix  Status:  Discontinued     750 mg 150 mL/hr over 60 Minutes Intravenous Every 12 hours 04/10/14 0523 04/12/14 1535   04/10/14 0600  piperacillin-tazobactam (ZOSYN) IVPB 3.375 g     3.375 g 12.5 mL/hr over 240 Minutes Intravenous 3 times per day 04/10/14 0523     04/09/14 2315  vancomycin (VANCOCIN) IVPB 1000 mg/200 mL premix     1,000 mg 200 mL/hr over 60 Minutes Intravenous  Once 04/09/14 2314 04/10/14 0155   04/09/14 2230  piperacillin-tazobactam (ZOSYN) IVPB 3.375 g     3.375 g 100 mL/hr over 30 Minutes Intravenous  Once 04/09/14 2227 04/09/14 2348       Assessment/Plan: Sepsis  Intra-abdominal abscess, unclear whether from diverticulitis or appendicitis  -Afebrile, benign exam, WBC normal. Tolerating clears, advance to soft -continue drain care  -culture E.coli -change Zosyn to Unasyn  -will repeat CT next week unless she has worsening s&s  -ambulate  -will need a colonoscopy in the future   LOS: 4 days    Rosario Jacks., [facility] 04/13/2014

## 2014-04-13 NOTE — Progress Notes (Signed)
Patient ID: Catherine Joseph, female   DOB: 1941-05-04, 73 y.o.   MRN: [id number]   Referring Physician(s): CCS  Subjective:  Pt without acute changes; a little sleepy this am ; states she didn't sleep well; denies sig abd pain,N/V; some mild soreness at RLQ drain  Allergies: Codeine  Medications: Prior to Admission medications   Medication Sig Start Date End Date Taking? Authorizing Provider  flecainide (TAMBOCOR) 50 MG tablet Take 1 tablet (50 mg total) by mouth 2 (two) times daily. 04/05/14  Yes Josue Hector, MD  glipiZIDE (GLUCOTROL) 5 MG tablet Take 5 mg by mouth daily before breakfast.   Yes Historical Provider, MD  ibuprofen (ADVIL,MOTRIN) 200 MG tablet Take 400 mg by mouth every 6 (six) hours as needed for moderate pain.   Yes Historical Provider, MD  simvastatin (ZOCOR) 20 MG tablet Take 20 mg by mouth daily.   Yes Historical Provider, MD  valsartan-hydrochlorothiazide (DIOVAN-HCT) 160-25 MG per tablet Take 1 tablet by mouth daily.   Yes Historical Provider, MD  ondansetron (ZOFRAN) 4 MG tablet Take 1 tablet (4 mg total) by mouth every 8 (eight) hours as needed for nausea or vomiting. 04/07/14   Margarita Mail, PA-C  traMADol (ULTRAM) 50 MG tablet Take 1 tablet (50 mg total) by mouth every 6 (six) hours as needed. 04/07/14   Margarita Mail, PA-C    Review of Systems see above  Vital Signs: BP 163/86  Pulse 60  Temp(Src) 98.5 F (36.9 C) (Oral)  Resp 18  Ht 5' 3.75" (1.619 m)  Wt 197 lb (89.359 kg)  BMI 34.09 kg/m2  SpO2 100%  Physical Exam pt awake/alert; RLQ drain intact, output 60 cc's; cx's- pan sensitive E coli  Imaging: Dg Chest 2 View  04/09/2014   CLINICAL DATA:  Cough for 2 weeks.  EXAM: CHEST  2 VIEW  COMPARISON:  Single view of the chest 04/07/2014 and 03/27/2014. CT chest 02/26/2013.  FINDINGS: There is cardiomegaly without edema. No pneumothorax or pleural effusion.  IMPRESSION: Cardiomegaly without acute disease.   Electronically Signed   By: Inge Rise M.D.   On: 04/09/2014 18:04   Ct Abdomen Pelvis W Contrast  04/09/2014   CLINICAL DATA:  Progressive lower pelvic pain with nausea for 2 weeks. Fever.  EXAM: CT ABDOMEN AND PELVIS WITH CONTRAST  TECHNIQUE: Multidetector CT imaging of the abdomen and pelvis was performed using the standard protocol following bolus administration of intravenous contrast.  CONTRAST:  144mL OMNIPAQUE IOHEXOL 300 MG/ML SOLN, 88mL OMNIPAQUE IOHEXOL 300 MG/ML SOLN  COMPARISON:  03/27/2014  FINDINGS: Patchy airspace disease in the lung bases suggesting infiltration or edema. Minimal bilateral pleural effusions.  The liver, spleen, gallbladder, pancreas, adrenal glands, kidneys, abdominal aorta, and inferior vena cava are unremarkable. There is inflammatory stranding in the retroperitoneum. Retroperitoneal lymph nodes are not pathologically enlarged and are likely reactive. Small accessory spleen. Portal veins appear patent. There are varices demonstrated in the left upper quadrant. Stomach, small bowel, and colon are not abnormally distended. Small umbilical hernia containing fat.  Pelvis: There is a large abscess in the anterior mid pelvis oriented slightly towards the right. The abscess measures 6.5 x 10.1 by 9.4 cm. There is extensive inflammatory stranding throughout the pelvis and extending up into the retroperitoneum. Smaller adjacent loculated fluid collections are present in the periphery of the main abscess. There is inflammatory stranding and thickening involving the sigmoid colon. Likely this represents locally perforated diverticulitis with diverticular abscess. No free intraperitoneal air is noted.  Small amount of free fluid in the pelvis is likely reactive. Uterus appears surgically absent. No abnormal adnexal masses. Appendix is not identified. Degenerative changes in the lumbar spine. No destructive bone lesions appreciated.  IMPRESSION: Large pelvic abscess with associated inflammatory stranding throughout the  pelvis and extending into the right lower quadrant and retroperitoneum. Small amount of free fluid in the pelvis. No free intraperitoneal air. This likely results from diverticulitis with local perforation and abscess formation. Appendix is not identified and abnormal appendix is not excluded.   Electronically Signed   By: Lucienne Capers M.D.   On: 04/09/2014 22:23   Ct Image Guided Drainage By Percutaneous Catheter  04/10/2014   CLINICAL DATA:  73 year old female with an abdominal abscess in the lower abdomen. Abscess is likely from a perforated appendicitis.  EXAM: CT GUIDED DRAINAGE OF LOWER ABDOMINAL ABSCESS  ANESTHESIA/SEDATION: 3 mg versed, 100 mcg fentanyl. A radiology nurse monitored the patient for moderate sedation.  Total Moderate Sedation Time:  25 minutes  PROCEDURE: The procedure was explained to the patient. The risks and benefits of the procedure were discussed and the patient's questions were addressed. Informed consent was obtained from the patient. The patient was placed on the CT scanner and the right side was mildly elevated. CT images through the lower abdomen and pelvis were obtained. The right side of the abdomen was prepped and draped in sterile fashion. Skin and soft tissues were anesthetized with 1% lidocaine. An 18 gauge needle was directed towards the intra-abdominal abscess with CT guidance. Foul-smelling yellow purulent fluid was aspirated from the needle. A stiff Amplatz wire was advanced into the collection. The tract was dilated and a 10 [place] multipurpose drain was placed. 75 mL of purulent fluid was removed. Sample was sent for culture. Catheter was attached to a suction bulb and sutured to the skin.  COMPLICATIONS: None  FINDINGS: Large abscess in the lower abdomen. There is a calcification along the superior aspect of the collection which is most compatible with an appendicolith. There is wall thickening in the sigmoid colon and adjacent small bowel loops.  IMPRESSION:  CT-guided drainage of the lower abdominal abscess.   Electronically Signed   By: Markus Daft M.D.   On: 04/10/2014 17:53    Labs:  CBC:  Recent Labs  04/09/14 2046 04/10/14 0530 04/11/14 0515 04/12/14 0453  WBC 11.6* 12.0* 10.0 6.1  HGB 10.7* 10.3* 10.5* 9.9*  HCT 32.6* 31.9* 32.4* 31.2*  PLT 285 292 339 358    COAGS:  Recent Labs  04/10/14 1215  INR 1.23    BMP:  Recent Labs  04/09/14 2046 04/10/14 0530 04/11/14 0515 04/12/14 0453  NA 137 139 139 143  K 3.8 3.7 3.4* 3.7  CL 100 101 103 106  CO2 22 26 25 28   GLUCOSE 115* 125* 106* 104*  BUN 7 6 5* 4*  CALCIUM 8.5 8.4 8.4 8.3*  CREATININE 0.88 0.90 0.82 0.85  GFRNONAA 63* 61* 68* 65*  GFRAA 73* 71* 79* 76*    LIVER FUNCTION TESTS:  Recent Labs  04/07/14 1535 04/09/14 1740 04/09/14 2046 04/10/14 0530  BILITOT 0.6 0.8 0.6 0.7  AST 30 25 26 23   ALT 34 23 24 21   ALKPHOS 244* 189* 184* 170*  PROT 8.0 6.7 7.0 6.8  ALBUMIN 2.5* 2.8* 2.2* 2.2*    Assessment and Plan:  S/p RLQ/?appendiceal abscess drainage 10/21; cont current tx; monitor labs; check f/u CT/inj next week; ambulate; other plans as per CCS/IM  I spent a total of 15 minutes face to face in clinical consultation/evaluation, greater than 50% of which was counseling/coordinating care for RLQ abscess drain.  Signed: Autumn Messing 04/13/2014, 9:02 AM

## 2014-04-13 NOTE — Progress Notes (Signed)
ANTIBIOTIC CONSULT NOTE   Pharmacy Consult for Unasyn Indication: Intra-abdominal infection  Allergies  Allergen Reactions  . Codeine Other (See Comments)    vertigo    Patient Measurements: Height: 5' 3.75" (161.9 cm) Weight: 197 lb (89.359 kg) IBW/kg (Calculated) : 54.13  Vital Signs: Temp: 98.5 F (36.9 C) (10/24 0621) Temp Source: Oral (10/24 0621) BP: 163/86 mmHg (10/24 0621) Pulse Rate: 60 (10/24 0621) Intake/Output from previous day: 10/23 0701 - 10/24 0700 In: 695 [P.O.:240; IV Piggyback:450] Out: 61 [Drains:60; Stool:1] Intake/Output from this shift:    Labs:  Recent Labs  04/11/14 0515 04/12/14 0453  WBC 10.0 6.1  HGB 10.5* 9.9*  PLT 339 358  CREATININE 0.82 0.85   Estimated Creatinine Clearance: 61.6 ml/min (by C-G formula based on Cr of 0.85). No results found for this basename: VANCOTROUGH, VANCOPEAK, VANCORANDOM, GENTTROUGH, GENTPEAK, GENTRANDOM, TOBRATROUGH, TOBRAPEAK, TOBRARND, AMIKACINPEAK, AMIKACINTROU, AMIKACIN,  in the last 72 hours    Assessment: 49 YOF presents with abdominal pain and sepsis.  Source from intra-abdominal abscess s/p drain placement by IR.  Exact source unknown - diverticular and appendicitis. Currently on vancomycin and zosyn.  Abscess culture growing GNR.   Pharmacy consulted to switch antibiotics to Unasyn.  10/21 >> Vanc >> 10/23 10/21 >> Zosyn >> 10/24 10/24 >> Unasyn >>  Tmax: afebrile WBCs: WNL Renal: SCr WNL, CrCl~80 ml/min  10/20 blood: NGTD 10/20 urine: multiple bacteria present, none predominat 10/20 wound: E.coli (pan-sensitive)  Goal of Therapy:  Doses adjusted per renal function Eradication of infection  Plan:  Unasyn 3g IV q6h.  Hershal Coria, PharmD, BCPS Pager: [phone] 04/13/2014 10:43 AM

## 2014-04-14 LAB — CBC
HEMATOCRIT: 30.9 % — AB (ref 36.0–46.0)
Hemoglobin: 10 g/dL — ABNORMAL LOW (ref 12.0–15.0)
MCH: 27 pg (ref 26.0–34.0)
MCHC: 32.4 g/dL (ref 30.0–36.0)
MCV: 83.3 fL (ref 78.0–100.0)
Platelets: 383 10*3/uL (ref 150–400)
RBC: 3.71 MIL/uL — ABNORMAL LOW (ref 3.87–5.11)
RDW: 14.5 % (ref 11.5–15.5)
WBC: 5.9 10*3/uL (ref 4.0–10.5)

## 2014-04-14 LAB — GLUCOSE, CAPILLARY
GLUCOSE-CAPILLARY: 139 mg/dL — AB (ref 70–99)
Glucose-Capillary: 125 mg/dL — ABNORMAL HIGH (ref 70–99)
Glucose-Capillary: 168 mg/dL — ABNORMAL HIGH (ref 70–99)
Glucose-Capillary: 140 mg/dL — ABNORMAL HIGH (ref 70–99)

## 2014-04-14 MED ORDER — CEPHALEXIN 500 MG PO CAPS
500.0000 mg | ORAL_CAPSULE | Freq: Four times a day (QID) | ORAL | Status: DC
  Administered 2014-04-14 – 2014-04-17 (×12): 500 mg via ORAL
  Filled 2014-04-14 (×15): qty 1

## 2014-04-14 NOTE — Progress Notes (Signed)
  Subjective: Pt doing well.  No abd pain.  Tol soft PO  Objective: Vital signs in last 24 hours: Temp:  [98 F (36.7 C)-98.6 F (37 C)] 98.4 F (36.9 C) (10/25 0536) Pulse Rate:  [64-73] 64 (10/25 0536) Resp:  [18] 18 (10/25 0536) BP: (140-153)/(57-70) 140/57 mmHg (10/25 0536) SpO2:  [98 %-100 %] 98 % (10/25 0536) Last BM Date: 04/13/14  Intake/Output from previous day: 10/24 0701 - 10/25 0700 In: 240 [P.O.:240] Out: 20 [Drains:20] Intake/Output this shift:    General appearance: alert and cooperative Resp: clear to auscultation bilaterally Cardio: regular rate and rhythm, S1, S2 normal, no murmur, click, rub or gallop GI: soft, non-tender; bowel sounds normal; no masses,  no organomegaly, JP SS  Lab Results:   Recent Labs  04/12/14 0453 04/14/14 0605  WBC 6.1 5.9  HGB 9.9* 10.0*  HCT 31.2* 30.9*  PLT 358 383   BMET  Recent Labs  04/12/14 0453  NA 143  K 3.7  CL 106  CO2 28  GLUCOSE 104*  BUN 4*  CREATININE 0.85  CALCIUM 8.3*   PT/INR No results found for this basename: LABPROT, INR,  in the last 72 hours ABG No results found for this basename: PHART, PCO2, PO2, HCO3,  in the last 72 hours  Studies/Results: No results found.  Anti-infectives: Anti-infectives   Start     Dose/Rate Route Frequency Ordered Stop   04/13/14 1400  Ampicillin-Sulbactam (UNASYN) 3 g in sodium chloride 0.9 % 100 mL IVPB     3 g 100 mL/hr over 60 Minutes Intravenous Every 6 hours 04/13/14 1046     04/10/14 1000  vancomycin (VANCOCIN) IVPB 750 mg/150 ml premix  Status:  Discontinued     750 mg 150 mL/hr over 60 Minutes Intravenous Every 12 hours 04/10/14 0523 04/12/14 1535   04/10/14 0600  piperacillin-tazobactam (ZOSYN) IVPB 3.375 g  Status:  Discontinued     3.375 g 12.5 mL/hr over 240 Minutes Intravenous 3 times per day 04/10/14 0523 04/13/14 1001   04/09/14 2315  vancomycin (VANCOCIN) IVPB 1000 mg/200 mL premix     1,000 mg 200 mL/hr over 60 Minutes Intravenous   Once 04/09/14 2314 04/10/14 0155   04/09/14 2230  piperacillin-tazobactam (ZOSYN) IVPB 3.375 g     3.375 g 100 mL/hr over 30 Minutes Intravenous  Once 04/09/14 2227 04/09/14 2348      Assessment/Plan: s/p * No surgery found * Advance diet to REg IV abx Home in AM if con't to do well on PO abx  LOS: 5 days    Rosario Jacks., [facility] 04/14/2014

## 2014-04-14 NOTE — Progress Notes (Signed)
Progress Note   Catherine Joseph LKJ:[id number] DOB: 1940/08/24 DOA: 04/09/2014 PCP: Kennon Portela, MD   Brief Narrative:   Catherine Joseph is an 73 y.o. female with a PMH of hypertension and diabetes who was admitted on 04/09/14 with a chief complaint of abdominal pain associated with fevers and chills. Upon initial evaluation, a CT scan of the abdomen showed a large pelvic abscess with associated inflammatory stranding, thought to be diverticular in origin.  Assessment/Plan:   Principal Problem:  Sepsis secondary to intra-abdominal abscess/local perforation (diverticular)   Sepsis criteria met: T 103F, hypotension, respiratory rate 28 bpm, leukocytosis 14.3.  Secondary to intraabdominal abscess as noted on CT abd/pelvis, possibly d/c home tomorrow per surgery.  Pt is s/p CT guided drain placed 10/21, doing well and clinically stable (done by IR Dr. Anselm Pancoast)  Initially treated with vancomycin and Zosyn. Vancomycin discontinued 04/12/14, Unasyn substituted for Zosyn 04/13/14.  Follow up on blood cultures, which remain negative to date.   Active Problems:  Hypertension   Stable.  Continue PRN Hydralazine. BP improved with resumption of Diovan and flecainide.  Diabetes mellitus, type II   Currently being managed with SSI q. a.c. and at bedtime. CBGs 125-167.  Anemia of chronic disease   Drop in Hg since admission likely dilutional.   Monitor.  Hypokalemia   Resolved with supplementation.  DVT Prophylaxis  Continue heparin.  Code Status: Full.  Family Communication: Plan of care discussed with the patient.  Disposition Plan: Home when stable.     IV Access:    Peripheral IV   Procedures and diagnostic studies:   CXR 04/09/2014 Cardiomegaly without acute disease.   Ct Abd/Pelvis W/C 04/09/2014 Large pelvic abscess with associated inflammatory stranding throughout the pelvis and extending into the right lower quadrant and retroperitoneum.  Small amount of free fluid in the pelvis. No free intraperitoneal air. This likely results from diverticulitis with local perforation and abscess formation. Appendix is not identified and abnormal appendix is not excluded.   04/10/2014 CT-guided drainage of the lower abdominal abscess.   Medical Consultants:    Dr. Excell Seltzer, Surgery  Dr. Jacqulynn Cadet, IR  Anti-Infectives:    Vancomycin 04/09/14 --> 04/12/14  Zosyn 04/09/14-->04/13/14  Unasyn 04/13/14--->   Subjective:   Catherine Joseph continues to feel well, without any specific complaints. No nausea or vomiting. Some mild pain at the drain site, but otherwise no significant pain. Tolerating a soft diet.   Objective:    Filed Vitals:   04/13/14 0621 04/13/14 1351 04/13/14 2142 04/14/14 0536  BP: 163/86 149/70 153/69 140/57  Pulse: 60 73 71 64  Temp: 98.5 F (36.9 C) 98 F (36.7 C) 98.6 F (37 C) 98.4 F (36.9 C)  TempSrc: Oral Oral Oral Oral  Resp: _0 Height:      Weight:      SpO2: 100% 100% 98% 98%    Intake/Output Summary (Last 24 hours) at 04/14/14 0815 Last data filed at 04/13/14 1826  Gross per 24 hour  Intake    240 ml  Output     20 ml  Net    220 ml    Exam: Gen:  NAD Cardiovascular:  RRR, No M/R/G Respiratory:  Lungs CTAB Gastrointestinal:  Abdomen soft, mildly tender at the drain site, + BS Extremities:  No C/E/C   Data Reviewed:    Labs: Basic Metabolic Panel:  Recent Labs Lab 04/09/14 1740 04/09/14 2046 04/10/14 0530 04/11/14 0515 04/12/14  0453  NA 137 137 139 139 143  K 4.1 3.8 3.7 3.4* 3.7  CL 100 100 101 103 106  CO2 _0 GLUCOSE 117* 115* 125* 106* 104*  BUN _1 5* 4*  CREATININE 0.86 0.88 0.90 0.82 0.85  CALCIUM 8.5 8.5 8.4 8.4 8.3*   GFR Estimated Creatinine Clearance: 61.6 ml/min (by C-G formula based on Cr of 0.85). Liver Function Tests:  Recent Labs Lab 04/07/14 1535 04/09/14 1740 04/09/14 2046 04/10/14 0530  AST  _2 ALT 34 _3 ALKPHOS 244* 189* 184* 170*  BILITOT 0.6 0.8 0.6 0.7  PROT 8.0 6.7 7.0 6.8  ALBUMIN 2.5* 2.8* 2.2* 2.2*    Recent Labs Lab 04/07/14 1535  LIPASE 27   Coagulation profile  Recent Labs Lab 04/10/14 1215  INR 1.23    CBC:  Recent Labs Lab 04/07/14 1535  04/09/14 2046 04/10/14 0530 04/11/14 0515 04/12/14 0453 04/14/14 0605  WBC 14.5*  < > 11.6* 12.0* 10.0 6.1 5.9  NEUTROABS 11.6*  --  9.5* 9.7*  --   --   --   HGB 12.4  < > 10.7* 10.3* 10.5* 9.9* 10.0*  HCT 38.5  < > 32.6* 31.9* 32.4* 31.2* 30.9*  MCV 84.8  < > 83.6 83.9 84.4 84.1 83.3  PLT 296  --  285 292 339 358 383  < > = values in this interval not displayed. CBG:  Recent Labs Lab 04/13/14 0806 04/13/14 1205 04/13/14 1712 04/13/14 2121 04/14/14 0704  GLUCAP 134* 147* 167* 148* 125*   Hgb A1c: No results found for this basename: HGBA1C,  in the last 72 hours Sepsis Labs:  Recent Labs Lab 04/07/14 1812  04/09/14 2225 04/10/14 0530 04/11/14 0515 04/12/14 0453 04/14/14 0605  WBC  --   < >  --  12.0* 10.0 6.1 5.9  LATICACIDVEN 2.08  --  0.65  --   --   --   --   < > = values in this interval not displayed. Microbiology Recent Results (from the past 240 hour(s))  URINE CULTURE     Status: None   Collection Time    04/07/14  3:26 PM      Result Value Ref Range Status   Specimen Description URINE, CLEAN CATCH   Final   Special Requests NONE   Final   Culture  Setup Time     Final   Value: 04/08/2014 08:54     Performed at [place]     Final   Value: 20,OOO COLONIES/ML     Performed at [facility]   Culture     Final   Value: Multiple bacterial morphotypes present, none predominant. Suggest appropriate recollection if clinically indicated.     Performed at [facility]   Report Status 04/09/2014 FINAL   Final  URINE CULTURE     Status: None   Collection Time    04/09/14  5:40 PM      Result Value Ref Range Status    Colony Count NO GROWTH   Final   Organism ID, Bacteria NO GROWTH   Final  CULTURE, BLOOD (ROUTINE X 2)     Status: None   Collection Time    04/09/14  8:46 PM      Result Value Ref Range Status   Specimen Description BLOOD LEFT ANTECUBITAL   Final   Special Requests BOTTLES DRAWN AEROBIC AND  ANAEROBIC 5ML   Final   Culture  Setup Time     Final   Value: 04/10/2014 00:35     Performed at [facility]   Culture     Final   Value:        BLOOD CULTURE RECEIVED NO GROWTH TO DATE CULTURE WILL BE HELD FOR 5 DAYS BEFORE ISSUING A FINAL NEGATIVE REPORT     Performed at [facility]   Report Status PENDING   Incomplete  CULTURE, BLOOD (ROUTINE X 2)     Status: None   Collection Time    04/09/14  8:46 PM      Result Value Ref Range Status   Specimen Description BLOOD LEFT HAND   Final   Special Requests BOTTLES DRAWN AEROBIC AND ANAEROBIC 5ML   Final   Culture  Setup Time     Final   Value: 04/10/2014 00:35     Performed at [facility]   Culture     Final   Value:        BLOOD CULTURE RECEIVED NO GROWTH TO DATE CULTURE WILL BE HELD FOR 5 DAYS BEFORE ISSUING A FINAL NEGATIVE REPORT     Performed at [facility]   Report Status PENDING   Incomplete  URINE CULTURE     Status: None   Collection Time    04/09/14  9:20 PM      Result Value Ref Range Status   Specimen Description URINE, CLEAN CATCH   Final   Special Requests NONE   Final   Culture  Setup Time     Final   Value: 04/10/2014 00:53     Performed at [facility]     Final   Value: 30,000 COLONIES/ML     Performed at [facility]   Culture     Final   Value: Multiple bacterial morphotypes present, none predominant. Suggest appropriate recollection if clinically indicated.     Performed at [facility]   Report Status 04/10/2014 FINAL   Final  CULTURE, ROUTINE-ABSCESS     Status: None   Collection Time    04/10/14  5:30 PM      Result Value Ref Range  Status   Specimen Description PERITONEAL CAVITY   Final   Special Requests NONE   Final   Gram Stain     Final   Value: ABUNDANT WBC PRESENT,BOTH PMN AND MONONUCLEAR     NO SQUAMOUS EPITHELIAL CELLS SEEN     ABUNDANT GRAM NEGATIVE RODS     Performed at [facility]   Culture     Final   Value: MODERATE ESCHERICHIA COLI     Performed at [facility]   Report Status 04/13/2014 FINAL   Final   Organism ID, Bacteria ESCHERICHIA COLI   Final     Medications:   . ampicillin-sulbactam (UNASYN) IV  3 g Intravenous Q6H  . flecainide  50 mg Oral BID  . heparin  5,000 Units Subcutaneous 3 times per day  . insulin aspart  0-5 Units Subcutaneous QHS  . insulin aspart  0-9 Units Subcutaneous TID WC  . irbesartan  150 mg Oral Daily  . simvastatin  20 mg Oral Daily   Continuous Infusions: . sodium chloride 75 mL/hr at 04/14/14 0324    Time spent: 25 minutes.   LOS: 5 days   [facility] Pager [phone]. If unable to reach me by  pager, please call my cell phone at [phone].  *Please refer to [email or web address], password [email or web address] to get updated schedule on who will round on this patient, as hospitalists switch teams weekly. If 7PM-7AM, please contact night-coverage at [email or web address], password [email or web address] for any overnight needs.  04/14/2014, 8:15 AM

## 2014-04-15 DIAGNOSIS — D638 Anemia in other chronic diseases classified elsewhere: Secondary | ICD-10-CM

## 2014-04-15 LAB — GLUCOSE, CAPILLARY
GLUCOSE-CAPILLARY: 156 mg/dL — AB (ref 70–99)
GLUCOSE-CAPILLARY: 236 mg/dL — AB (ref 70–99)
Glucose-Capillary: 125 mg/dL — ABNORMAL HIGH (ref 70–99)
Glucose-Capillary: 145 mg/dL — ABNORMAL HIGH (ref 70–99)

## 2014-04-15 MED ORDER — HYDROCODONE-ACETAMINOPHEN 5-325 MG PO TABS
2.0000 | ORAL_TABLET | Freq: Once | ORAL | Status: AC
Start: 2014-04-15 — End: 2014-04-15
  Administered 2014-04-15: 2 via ORAL
  Filled 2014-04-15: qty 2

## 2014-04-15 MED ORDER — CYCLOBENZAPRINE HCL 5 MG PO TABS
5.0000 mg | ORAL_TABLET | Freq: Once | ORAL | Status: AC
  Administered 2014-04-15: 5 mg via ORAL
  Filled 2014-04-15: qty 1

## 2014-04-15 MED ORDER — DEXTROMETHORPHAN POLISTIREX 30 MG/5ML PO LQCR
30.0000 mg | Freq: Two times a day (BID) | ORAL | Status: DC
  Administered 2014-04-15 – 2014-04-17 (×5): 30 mg via ORAL
  Filled 2014-04-15 (×6): qty 5

## 2014-04-15 MED ORDER — HYDROCHLOROTHIAZIDE 25 MG PO TABS
25.0000 mg | ORAL_TABLET | Freq: Every day | ORAL | Status: DC
  Administered 2014-04-15 – 2014-04-17 (×3): 25 mg via ORAL
  Filled 2014-04-15 (×3): qty 1

## 2014-04-15 NOTE — Progress Notes (Signed)
Patient seen and examined.  Improving overall.  Will check follow up CT tomorrow.  Needs drain study prior to drain removal.

## 2014-04-15 NOTE — Progress Notes (Signed)
Subjective: Abdomen is soft, cloudy drainage from the drain, but not very much.    Objective: Vital signs in last 24 hours: Temp:  [98.3 F (36.8 C)-98.4 F (36.9 C)] 98.3 F (36.8 C) (10/26 0509) Pulse Rate:  [59-68] 64 (10/26 0509) Resp:  [18] 18 (10/26 0509) BP: (161-167)/(69-87) 167/69 mmHg (10/26 0509) SpO2:  [98 %-100 %] 98 % (10/26 0509) Last BM Date: 04/14/14 720 PO Cardiac diet Afebrile, VSS No labs 10 ml from the drain DAy 6 of antibiotics, she has been on Zosyn, Unasyn and Keflex tablets started yesterday Urine culture + 30K, multiorganisms Peritoneal fluid:     ESCHERICHIA COLI      AMPICILLIN  <=2 SENSITIVE S Final        AMPICILLIN/SULBACTAM  <=2 SENSITIVE S Final        CEFAZOLIN  <=4 SENSITIVE S Final        CEFEPIME  <=1 SENSITIVE S Final        CEFTAZIDIME  <=1 SENSITIVE S Final        CEFTRIAXONE  <=1 SENSITIVE S Final        CIPROFLOXACIN  <=0.25 SENSITIVE S Final        GENTAMICIN  4 SENSITIVE S Final        IMIPENEM  1 SENSITIVE S Final        PIP/TAZO  <=4 SENSITIVE S Final        TOBRAMYCIN  2 SENSITIVE S Final        TRIMETH/SULFA  <=20 SENSITIVE S Final                      Comments    Intake/Output from previous day: 10/25 0701 - 10/26 0700 In: 4265.6 [P.O.:720; I.V.:3515.6] Out: 10 [Drains:10] Intake/Output this shift:    General appearance: alert, cooperative and no distress GI: soft, sore at drain site, otherwise nontender with + BS, BM  Lab Results:   Recent Labs  04/14/14 0605  WBC 5.9  HGB 10.0*  HCT 30.9*  PLT 383    BMET No results found for this basename: NA, K, CL, CO2, GLUCOSE, BUN, CREATININE, CALCIUM,  in the last 72 hours PT/INR No results found for this basename: LABPROT, INR,  in the last 72 hours   Recent Labs Lab 04/09/14 1740 04/09/14 2046 04/10/14 0530  AST 25 26 23   ALT 23 24 21   ALKPHOS 189* 184* 170*  BILITOT 0.8 0.6 0.7  PROT 6.7 7.0 6.8  ALBUMIN 2.8* 2.2* 2.2*     Lipase      Component Value Date/Time   LIPASE 27 04/07/2014 1535     Studies/Results: No results found.  Medications: . cephALEXin  500 mg Oral QID  . flecainide  50 mg Oral BID  . heparin  5,000 Units Subcutaneous 3 times per day  . insulin aspart  0-5 Units Subcutaneous QHS  . insulin aspart  0-9 Units Subcutaneous TID WC  . irbesartan  150 mg Oral Daily  . simvastatin  20 mg Oral Daily    Assessment/Plan Intraabdominal abscess, appendicitis versus diverticulitis;  S/p CT guided drainage of lower abdominal abscess. 75 ml of foul smelling purulent yellow fluid removed. Placed a 10 [place] drain Carylon Perches, MD, 04/10/2014  Sepsis with fever and hypotension AODM Type II Anemia Hx of bradycardia /PVC on Flecainide at admit    Plan:  She seems to be doing well, she hasn't walked very much.  Tolerating regular diet.  I will check on timing for repeat CT scan and discuss drain with Dr. Zella Richer.   LOS: 6 days    Catherine Joseph 04/15/2014

## 2014-04-15 NOTE — Care Management Note (Addendum)
    Page 2 of 2   04/18/2014     8:22:50 AM CARE MANAGEMENT NOTE 04/18/2014  Patient:  Catherine Joseph, Catherine Joseph   Account Number:  [id number]  Date Initiated:  04/15/2014  Documentation initiated by:  Dessa Phi  Subjective/Objective Assessment:   74 Y/O F ADMITTED W/SEPSIS.INTRA ABDOMINAL ABSCESS.     Action/Plan:   FROM HOME.   Anticipated DC Date:  04/17/2014   Anticipated DC Plan:  [place]  CM consult      [facility] Choice  HOME HEALTH   Choice offered to / List presented to:  C-1 Patient   DME arranged  NA      DME agency  NA     [place] arranged  HH-1 RN      [place].   Status of service:  Completed, signed off Medicare Important Message given?  YES (If response is "NO", the following Medicare IM given date fields will be blank) Date Medicare IM given:  04/17/2014 Medicare IM given by:  [facility] Date Additional Medicare IM given:   Additional Medicare IM given by:    Discharge Disposition:  [place]  Per UR Regulation:  Reviewed for med. necessity/level of care/duration of stay  If discussed at [place] of Stay Meetings, dates discussed:   04/16/2014    Comments:  04/17/14 08:18 CM spoke with pt to offer choice of home health agency.  Pt chooses to render [facility] services. Address and contact information verified with pt.  Referral called to [facility].  No other needs were communicated. Mariane Masters, BSN, [place].  04/16/14 KATHY MAHABIR RN,BSN NCM [phone] CT-RESOLUTION OF ABSCESS.IR-DRAIN CAN BE REMOVED.NO ANTICIPATED D/C NEEDS.  04/15/14 KATHY MAHABIR RN,BSN NCM [phone] SX FOLLOWING-DRAIN-CT.PT CONS.PO ABX.NO ANTICIPATED D/C NEEDS.

## 2014-04-15 NOTE — Progress Notes (Signed)
Progress Note   Catherine Joseph HBZ:[id number] DOB: [birth year] DOA: 04/09/2014 PCP: Kennon Portela, MD   Brief Narrative:   Catherine Joseph is an 73 y.o. female with a PMH of hypertension and diabetes who was admitted on 04/09/14 with a chief complaint of abdominal pain associated with fevers and chills. Upon initial evaluation, a CT scan of the abdomen showed a large pelvic abscess with associated inflammatory stranding, thought to be diverticular in origin.  Assessment/Plan:   Principal Problem:  Sepsis secondary to intra-abdominal abscess/local perforation (diverticular)   Sepsis criteria met: T 103F, hypotension, respiratory rate 28 bpm, leukocytosis 14.3.  Secondary to intraabdominal abscess as noted on CT abd/pelvis, may need another CT prior to D/C, awaiting surgery recommendations.  Pt is s/p CT guided drain placed 10/21, doing well and clinically stable (done by IR Dr. Anselm Pancoast).  Cultures + for E. Coli.  Initially treated with vancomycin and Zosyn. Vancomycin discontinued 04/12/14, Unasyn substituted for Zosyn 04/13/14, now on PO Keflex.  Follow up on blood cultures, which remain negative to date.   Active Problems:  Hypertension   Stable.  Continue PRN Hydralazine. BP improved with resumption of Diovan and flecainide.  Resume HCTZ and D/C IVF.  Diabetes mellitus, type II   Currently being managed with SSI q. a.c. and at bedtime. CBGs 125-168.  Anemia of chronic disease   Drop in Hg since admission likely dilutional.   Monitor.  Hypokalemia   Resolved with supplementation.  DVT Prophylaxis  Continue heparin.  Code Status: Full.  Family Communication: Plan of care discussed with the patient.  Disposition Plan: Home when stable.     IV Access:    Peripheral IV   Procedures and diagnostic studies:   CXR 04/09/2014 Cardiomegaly without acute disease.   Ct Abd/Pelvis W/C 04/09/2014 Large pelvic abscess with associated inflammatory  stranding throughout the pelvis and extending into the right lower quadrant and retroperitoneum. Small amount of free fluid in the pelvis. No free intraperitoneal air. This likely results from diverticulitis with local perforation and abscess formation. Appendix is not identified and abnormal appendix is not excluded.   04/10/2014 CT-guided drainage of the lower abdominal abscess.   Medical Consultants:    Dr. Excell Seltzer, Surgery  Dr. Jacqulynn Cadet, IR  Anti-Infectives:    Vancomycin 04/09/14 --> 04/12/14  Zosyn 04/09/14-->04/13/14  Unasyn 04/13/14--->   Subjective:   Catherine Joseph continues to feel well, without any specific complaints. No nausea or vomiting. Some mild pain at the drain site, but otherwise no significant pain. Tolerating a soft diet. No dyspnea.  + dry cough.   Objective:    Filed Vitals:   04/14/14 0536 04/14/14 1334 04/14/14 2037 04/15/14 0509  BP: 140/57 161/87 165/75 167/69  Pulse: 64 68 59 64  Temp: 98.4 F (36.9 C) 98.3 F (36.8 C) 98.4 F (36.9 C) 98.3 F (36.8 C)  TempSrc: Oral Oral Oral Oral  Resp: $Remo'18 18 18 18  'Vjkqr$ Height:      Weight:      SpO2: 98% 100% 100% 98%    Intake/Output Summary (Last 24 hours) at 04/15/14 1249 Last data filed at 04/15/14 0017  Gross per 24 hour  Intake 4025.62 ml  Output      0 ml  Net 4025.62 ml    Exam: Gen:  NAD Cardiovascular:  RRR, No M/R/G Respiratory:  Lungs CTAB Gastrointestinal:  Abdomen soft, mildly tender at the drain site, + BS Extremities:  No C/E/C   Data  Reviewed:    Labs: Basic Metabolic Panel:  Recent Labs Lab 04/09/14 1740 04/09/14 2046 04/10/14 0530 04/11/14 0515 04/12/14 0453  NA 137 137 139 139 143  K 4.1 3.8 3.7 3.4* 3.7  CL 100 100 101 103 106  CO2 $Re'27 22 26 25 28  'hIh$ GLUCOSE 117* 115* 125* 106* 104*  BUN $Re'8 7 6 'Vhk$ 5* 4*  CREATININE 0.86 0.88 0.90 0.82 0.85  CALCIUM 8.5 8.5 8.4 8.4 8.3*   GFR Estimated Creatinine Clearance: 61.6 ml/min (by C-G formula  based on Cr of 0.85). Liver Function Tests:  Recent Labs Lab 04/09/14 1740 04/09/14 2046 04/10/14 0530  AST $Re'25 26 23  'XDt$ ALT $R'23 24 21  'Lh$ ALKPHOS 189* 184* 170*  BILITOT 0.8 0.6 0.7  PROT 6.7 7.0 6.8  ALBUMIN 2.8* 2.2* 2.2*   Coagulation profile  Recent Labs Lab 04/10/14 1215  INR 1.23    CBC:  Recent Labs Lab 04/09/14 2046 04/10/14 0530 04/11/14 0515 04/12/14 0453 04/14/14 0605  WBC 11.6* 12.0* 10.0 6.1 5.9  NEUTROABS 9.5* 9.7*  --   --   --   HGB 10.7* 10.3* 10.5* 9.9* 10.0*  HCT 32.6* 31.9* 32.4* 31.2* 30.9*  MCV 83.6 83.9 84.4 84.1 83.3  PLT 285 292 339 358 383   CBG:  Recent Labs Lab 04/14/14 1131 04/14/14 1635 04/14/14 2040 04/15/14 0725 04/15/14 1127  GLUCAP 168* 139* 140* 125* 145*   Hgb A1c: No results found for this basename: HGBA1C,  in the last 72 hours Sepsis Labs:  Recent Labs Lab 04/09/14 2225 04/10/14 0530 04/11/14 0515 04/12/14 0453 04/14/14 0605  WBC  --  12.0* 10.0 6.1 5.9  LATICACIDVEN 0.65  --   --   --   --    Microbiology Recent Results (from the past 240 hour(s))  URINE CULTURE     Status: None   Collection Time    04/07/14  3:26 PM      Result Value Ref Range Status   Specimen Description URINE, CLEAN CATCH   Final   Special Requests NONE   Final   Culture  Setup Time     Final   Value: 04/08/2014 08:54     Performed at [facility]     Final   Value: 20,OOO COLONIES/ML     Performed at [facility]   Culture     Final   Value: Multiple bacterial morphotypes present, none predominant. Suggest appropriate recollection if clinically indicated.     Performed at [facility]   Report Status 04/09/2014 FINAL   Final  URINE CULTURE     Status: None   Collection Time    04/09/14  5:40 PM      Result Value Ref Range Status   Colony Count NO GROWTH   Final   Organism ID, Bacteria NO GROWTH   Final  CULTURE, BLOOD (ROUTINE X 2)     Status: None   Collection Time    04/09/14  8:46  PM      Result Value Ref Range Status   Specimen Description BLOOD LEFT ANTECUBITAL   Final   Special Requests BOTTLES DRAWN AEROBIC AND ANAEROBIC 5ML   Final   Culture  Setup Time     Final   Value: 04/10/2014 00:35     Performed at [facility]   Culture     Final   Value:        BLOOD CULTURE RECEIVED NO GROWTH TO  DATE CULTURE WILL BE HELD FOR 5 DAYS BEFORE ISSUING A FINAL NEGATIVE REPORT     Performed at [facility]   Report Status PENDING   Incomplete  CULTURE, BLOOD (ROUTINE X 2)     Status: None   Collection Time    04/09/14  8:46 PM      Result Value Ref Range Status   Specimen Description BLOOD LEFT HAND   Final   Special Requests BOTTLES DRAWN AEROBIC AND ANAEROBIC 5ML   Final   Culture  Setup Time     Final   Value: 04/10/2014 00:35     Performed at [facility]   Culture     Final   Value:        BLOOD CULTURE RECEIVED NO GROWTH TO DATE CULTURE WILL BE HELD FOR 5 DAYS BEFORE ISSUING A FINAL NEGATIVE REPORT     Performed at [facility]   Report Status PENDING   Incomplete  URINE CULTURE     Status: None   Collection Time    04/09/14  9:20 PM      Result Value Ref Range Status   Specimen Description URINE, CLEAN CATCH   Final   Special Requests NONE   Final   Culture  Setup Time     Final   Value: 04/10/2014 00:53     Performed at [place]     Final   Value: 30,000 COLONIES/ML     Performed at [facility]   Culture     Final   Value: Multiple bacterial morphotypes present, none predominant. Suggest appropriate recollection if clinically indicated.     Performed at [facility]   Report Status 04/10/2014 FINAL   Final  CULTURE, ROUTINE-ABSCESS     Status: None   Collection Time    04/10/14  5:30 PM      Result Value Ref Range Status   Specimen Description PERITONEAL CAVITY   Final   Special Requests NONE   Final   Gram Stain     Final   Value: ABUNDANT WBC PRESENT,BOTH PMN AND  MONONUCLEAR     NO SQUAMOUS EPITHELIAL CELLS SEEN     ABUNDANT GRAM NEGATIVE RODS     Performed at [facility]   Culture     Final   Value: MODERATE ESCHERICHIA COLI     Performed at [facility]   Report Status 04/13/2014 FINAL   Final   Organism ID, Bacteria ESCHERICHIA COLI   Final     Medications:   . cephALEXin  500 mg Oral QID  . flecainide  50 mg Oral BID  . heparin  5,000 Units Subcutaneous 3 times per day  . insulin aspart  0-5 Units Subcutaneous QHS  . insulin aspart  0-9 Units Subcutaneous TID WC  . irbesartan  150 mg Oral Daily  . simvastatin  20 mg Oral Daily   Continuous Infusions: . sodium chloride 75 mL/hr at 04/15/14 1024    Time spent: 25 minutes.   LOS: 6 days   [facility] Pager [phone]. If unable to reach me by pager, please call my cell phone at [phone].  *Please refer to [email or web address], password [email or web address] to get updated schedule on who will round on this patient, as hospitalists switch teams weekly. If 7PM-7AM, please contact night-coverage at [email or web address], password [email or web address] for any overnight needs.  04/15/2014, 12:49 PM

## 2014-04-16 ENCOUNTER — Inpatient Hospital Stay (HOSPITAL_COMMUNITY): Payer: Medicare HMO

## 2014-04-16 ENCOUNTER — Ambulatory Visit (HOSPITAL_COMMUNITY): Payer: Medicare HMO

## 2014-04-16 DIAGNOSIS — K5720 Diverticulitis of large intestine with perforation and abscess without bleeding: Secondary | ICD-10-CM

## 2014-04-16 LAB — BASIC METABOLIC PANEL
ANION GAP: 10 (ref 5–15)
BUN: 7 mg/dL (ref 6–23)
CHLORIDE: 99 meq/L (ref 96–112)
CO2: 30 mEq/L (ref 19–32)
Calcium: 9.1 mg/dL (ref 8.4–10.5)
Creatinine, Ser: 0.95 mg/dL (ref 0.50–1.10)
GFR calc non Af Amer: 57 mL/min — ABNORMAL LOW (ref >90)
GFR, EST AFRICAN AMERICAN: 66 mL/min — AB (ref >90)
Glucose, Bld: 150 mg/dL — ABNORMAL HIGH (ref 70–99)
POTASSIUM: 3.3 meq/L — AB (ref 3.7–5.3)
Sodium: 139 mEq/L (ref 137–147)

## 2014-04-16 LAB — GLUCOSE, CAPILLARY
GLUCOSE-CAPILLARY: 258 mg/dL — AB (ref 70–99)
Glucose-Capillary: 107 mg/dL — ABNORMAL HIGH (ref 70–99)
Glucose-Capillary: 113 mg/dL — ABNORMAL HIGH (ref 70–99)
Glucose-Capillary: 192 mg/dL — ABNORMAL HIGH (ref 70–99)

## 2014-04-16 LAB — CULTURE, BLOOD (ROUTINE X 2)
Culture: NO GROWTH
Culture: NO GROWTH

## 2014-04-16 MED ORDER — IOHEXOL 300 MG/ML  SOLN
100.0000 mL | Freq: Once | INTRAMUSCULAR | Status: AC | PRN
  Administered 2014-04-16: 100 mL via INTRAVENOUS

## 2014-04-16 MED ORDER — POTASSIUM CHLORIDE CRYS ER 20 MEQ PO TBCR
40.0000 meq | EXTENDED_RELEASE_TABLET | Freq: Once | ORAL | Status: AC
  Administered 2014-04-16: 40 meq via ORAL
  Filled 2014-04-16: qty 2

## 2014-04-16 MED ORDER — ENSURE COMPLETE PO LIQD
237.0000 mL | Freq: Every day | ORAL | Status: DC
  Administered 2014-04-16: 237 mL via ORAL

## 2014-04-16 MED ORDER — IOHEXOL 300 MG/ML  SOLN
25.0000 mL | INTRAMUSCULAR | Status: AC
  Administered 2014-04-16 (×2): 25 mL via ORAL

## 2014-04-16 MED ORDER — FENTANYL CITRATE 0.05 MG/ML IJ SOLN
INTRAMUSCULAR | Status: AC
  Filled 2014-04-16: qty 6

## 2014-04-16 MED ORDER — MIDAZOLAM HCL 2 MG/2ML IJ SOLN
INTRAMUSCULAR | Status: DC
Start: 2014-04-16 — End: 2014-04-16
  Filled 2014-04-16: qty 6

## 2014-04-16 NOTE — Progress Notes (Signed)
  Subjective: She is fine, only complaint is the drain site.  Drain is still a cloudy-red-serous appearing fluid.  Objective: Vital signs in last 24 hours: Temp:  [98.2 F (36.8 C)-98.5 F (36.9 C)] 98.2 F (36.8 C) (10/27 0547) Pulse Rate:  [57-66] 66 (10/27 0547) Resp:  [18] 18 (10/27 0547) BP: (149-164)/(72-84) 160/84 mmHg (10/27 0547) SpO2:  [97 %-98 %] 97 % (10/27 0547) Last BM Date: 04/14/14 PO not recorded CArdiac diet 10 ml from drain Afebrile VSS K+3.3 CT pending Intake/Output from previous day: 10/26 0701 - 10/27 0700 In: 5  Out: 10 [Drains:10] Intake/Output this shift:    General appearance: alert, cooperative and no distress GI: soft, non-tender; bowel sounds normal; no masses,  no organomegaly and sore at drain site.-  Lab Results:   Recent Labs  04/14/14 0605  WBC 5.9  HGB 10.0*  HCT 30.9*  PLT 383    BMET  Recent Labs  04/16/14  NA 139  K 3.3*  CL 99  CO2 30  GLUCOSE 150*  BUN 7  CREATININE 0.95  CALCIUM 9.1   PT/INR No results found for this basename: LABPROT, INR,  in the last 72 hours   Recent Labs Lab 04/09/14 1740 04/09/14 2046 04/10/14 0530  AST 25 26 23   ALT 23 24 21   ALKPHOS 189* 184* 170*  BILITOT 0.8 0.6 0.7  PROT 6.7 7.0 6.8  ALBUMIN 2.8* 2.2* 2.2*     Lipase     Component Value Date/Time   LIPASE 27 04/07/2014 1535     Studies/Results: No results found.  Medications: . cephALEXin  500 mg Oral QID  . dextromethorphan  30 mg Oral BID  . flecainide  50 mg Oral BID  . heparin  5,000 Units Subcutaneous 3 times per day  . hydrochlorothiazide  25 mg Oral Daily  . insulin aspart  0-5 Units Subcutaneous QHS  . insulin aspart  0-9 Units Subcutaneous TID WC  . irbesartan  150 mg Oral Daily  . potassium chloride  40 mEq Oral Once  . simvastatin  20 mg Oral Daily    Assessment/Plan Intraabdominal abscess, appendicitis versus diverticulitis; S/p CT guided drainage of lower abdominal abscess. 75 ml of foul  smelling purulent yellow fluid removed. Placed a 10 [place] drain  Catherine Perches, MD, 04/10/2014  Sepsis with fever and hypotension  AODM Type II  Anemia  Hx of bradycardia /PVC on Flecainide at admit   Plan:  Await CT results.  CT scan:  Right pelvic abscess is decompressed by the pigtail catheter  inserted on 04/10/2014. No evidence of a recurrent or new abscess.  There are inflammatory type changes surrounding the pigtail catheter  adjacent to the cecal tip and right adnexa. No normal appendix is  seen. As noted previously, the abscess may have been from  diverticulitis. An ruptured appendix is also possible since a normal  appendix is not visualized.  2. No new acute abnormalities. No evidence of bowel obstruction.   Will discuss with Dr. Zella Richer.  LOS: 7 days    Catherine Joseph 04/16/2014

## 2014-04-16 NOTE — Progress Notes (Signed)
Patient ID: Catherine Joseph, female   DOB: 1941/04/04, 73 y.o.   MRN: [id number]   Referring Physician(s): CCS  Subjective:  Pt doing well; no new c/o ; hungry  Allergies: Codeine  Medications: Prior to Admission medications   Medication Sig Start Date End Date Taking? Authorizing Provider  flecainide (TAMBOCOR) 50 MG tablet Take 1 tablet (50 mg total) by mouth 2 (two) times daily. 04/05/14  Yes Josue Hector, MD  glipiZIDE (GLUCOTROL) 5 MG tablet Take 5 mg by mouth daily before breakfast.   Yes Historical Provider, MD  ibuprofen (ADVIL,MOTRIN) 200 MG tablet Take 400 mg by mouth every 6 (six) hours as needed for moderate pain.   Yes Historical Provider, MD  simvastatin (ZOCOR) 20 MG tablet Take 20 mg by mouth daily.   Yes Historical Provider, MD  valsartan-hydrochlorothiazide (DIOVAN-HCT) 160-25 MG per tablet Take 1 tablet by mouth daily.   Yes Historical Provider, MD  ondansetron (ZOFRAN) 4 MG tablet Take 1 tablet (4 mg total) by mouth every 8 (eight) hours as needed for nausea or vomiting. 04/07/14   Margarita Mail, PA-C  traMADol (ULTRAM) 50 MG tablet Take 1 tablet (50 mg total) by mouth every 6 (six) hours as needed. 04/07/14   Margarita Mail, PA-C    Review of Systems  see above  Vital Signs: BP 160/84  Pulse 66  Temp(Src) 98.2 F (36.8 C) (Oral)  Resp 18  Ht 5' 3.75" (1.619 m)  Wt 197 lb (89.359 kg)  BMI 34.09 kg/m2  SpO2 97%  Physical Exam pt awake/alert; chest- CTA bilat; heart- RRR; abd- soft,+BS, mildly tender rt pelvic region at drain site; drain intact, insertion site ok, output minimal; cx's- e coli  Imaging: Ct Abdomen Pelvis W Contrast  04/16/2014   CLINICAL DATA:  Status post drain given abscess on 04/10/2014. Patient with fever. History of a total abdominal hysterectomy.  EXAM: CT ABDOMEN AND PELVIS WITH CONTRAST  TECHNIQUE: Multidetector CT imaging of the abdomen and pelvis was performed using the standard protocol following bolus administration of  intravenous contrast.  CONTRAST:  122mL OMNIPAQUE IOHEXOL 300 MG/ML  SOLN  COMPARISON:  CTs, 04/10/2014 and 04/09/2014.  FINDINGS: Pigtail catheter curls in the right central pelvis. The complex collection noted on the CT from 04/09/2014 has been evacuated. There is surrounding inflammatory type stranding, but no residual discrete fluid collection.  Uterus is surgically absent.  No adnexal masses.  No pathologically enlarged lymph nodes.  No generalized ascites.  Heart is mildly enlarged. There is mild subsegmental lung base atelectasis. No pleural effusions.  Small area of relative increased enhancement in the liver superficially, not evident on the delayed sequence. This is consistent with transient hepatic attenuation difference, incidental. Liver otherwise unremarkable.  Spleen, gallbladder, pancreas, adrenal glands:  Normal.  4 mm low-density lesion arises from the anterior lower pole of the left kidney, likely a cyst. Kidneys otherwise unremarkable. Normal ureters. Normal bladder.  There are scattered left colon diverticula. No diverticulitis. Mild increased stool is noted in the colon. There is no wall thickening or inflammatory changes. Small bowel is unremarkable. Normal appendix not discretely seen.  Mild degenerative changes of the lower thoracic and lower lumbar spine. No osteoblastic or osteolytic lesions.  IMPRESSION: 1. Right pelvic abscess is decompressed by the pigtail catheter inserted on 04/10/2014. No evidence of a recurrent or new abscess. There are inflammatory type changes surrounding the pigtail catheter adjacent to the cecal tip and right adnexa. No normal appendix is seen. As noted previously, the  abscess may have been from diverticulitis. An ruptured appendix is also possible since a normal appendix is not visualized. 2. No new acute abnormalities.  No evidence of bowel obstruction.   Electronically Signed   By: Lajean Manes M.D.   On: 04/16/2014 09:06    Labs:  CBC:  Recent Labs   04/10/14 0530 04/11/14 0515 04/12/14 0453 04/14/14 0605  WBC 12.0* 10.0 6.1 5.9  HGB 10.3* 10.5* 9.9* 10.0*  HCT 31.9* 32.4* 31.2* 30.9*  PLT 292 339 358 383    COAGS:  Recent Labs  04/10/14 1215  INR 1.23    BMP:  Recent Labs  04/10/14 0530 04/11/14 0515 04/12/14 0453 04/16/14  NA 139 139 143 139  K 3.7 3.4* 3.7 3.3*  CL 101 103 106 99  CO2 26 25 28 30   GLUCOSE 125* 106* 104* 150*  BUN 6 5* 4* 7  CALCIUM 8.4 8.4 8.3* 9.1  CREATININE 0.90 0.82 0.85 0.95  GFRNONAA 61* 68* 65* 57*  GFRAA 71* 79* 76* 66*    LIVER FUNCTION TESTS:  Recent Labs  04/07/14 1535 04/09/14 1740 04/09/14 2046 04/10/14 0530  BILITOT 0.6 0.8 0.6 0.7  AST 30 25 26 23   ALT 34 23 24 21   ALKPHOS 244* 189* 184* 170*  PROT 8.0 6.7 7.0 6.8  ALBUMIN 2.5* 2.8* 2.2* 2.2*    Assessment and Plan: S/p RLQ/?appendiceal abscess drainage 10/21; latest CT (done without oral contrast) reviewed by Dr. Barbie Banner; will give pt oral contrast this am and plan rescan of pelvis later today to give better outline of bowel adjacent to drain and exclude need for additional intervention; may inject drain as well to r/o fistula. Above d/w pt/daughter with their understanding and consent. Other plans per primary /CCS       I spent a total of 15 minutes face to face in clinical consultation/evaluation, greater than 50% of which was counseling/coordinating care for right pelvic abscess drain  Signed: Adriene Knipfer,D KEVIN 04/16/2014, 10:10 AM

## 2014-04-16 NOTE — Evaluation (Signed)
Physical Therapy Evaluation Patient Details Name: Catherine Joseph MRN: [id number] DOB: 1940-07-10 Today's Date: 04/16/2014   History of Present Illness  73 yo female admitted with abdominal pain and findings of pelvic abscess with possible sources including diverticulitis vs appendicitis  Clinical Impression  No further PT  Needs at this time; [name], discussed with daughter;    Follow Up Recommendations No PT follow up    Equipment Recommendations  None recommended by PT    Recommendations for Other Services       Precautions / Restrictions Precautions Precautions: None      Mobility  Bed Mobility               General bed mobility comments: pt in chair  Transfers Overall transfer level: Independent Equipment used: None Transfers: Sit to/from Stand              Ambulation/Gait Ambulation/Gait assistance: Independent Ambulation Distance (Feet): 340 Feet Assistive device: None Gait Pattern/deviations: WFL(Within Functional Limits)        Stairs            Wheelchair Mobility    Modified Rankin (Stroke Patients Only)       Balance             Standing balance-Leahy Scale: Normal               High level balance activites: Backward walking;Direction changes;Turns;Sudden stops;Head turns High Level Balance Comments: no LOB with above or unanticipated minimal, multi-directional perturbations             Pertinent Vitals/Pain Pain Assessment: No/denies pain    Home Living Family/patient expects to be discharged to:: Private residence Living Arrangements: Children Available Help at Discharge: Family Type of Home: House       Home Layout: One level Home Equipment: None      Prior Function Level of Independence: Independent               Hand Dominance        Extremity/Trunk Assessment   Upper Extremity Assessment: Overall WFL for tasks assessed           Lower  Extremity Assessment: Overall WFL for tasks assessed         Communication   Communication: No difficulties  Cognition Arousal/Alertness: Awake/alert Behavior During Therapy: WFL for tasks assessed/performed Overall Cognitive Status: Within Functional Limits for tasks assessed                      General Comments      Exercises        Assessment/Plan    PT Assessment [place] does not need any further PT services  PT Diagnosis Difficulty walking   PT Problem List    PT Treatment Interventions     PT Goals (Current goals can be found in the Care Plan section) Acute Rehab PT Goals PT Goal Formulation: All assessment and education complete, DC therapy    Frequency     Barriers to discharge        Co-evaluation               End of Session   Activity Tolerance: Patient tolerated treatment well Patient left: in chair;with call bell/phone within reach;with family/visitor present           Time: 1110-1120 PT Time Calculation (min): 10 min   Charges:   PT Evaluation $Initial PT Evaluation Tier I:  1 Procedure PT Treatments $Gait Training: 8-22 mins   PT G Codes:          Catherine Joseph 2014-05-13, 12:55 PM

## 2014-04-16 NOTE — Progress Notes (Signed)
Patient ID: Catherine Joseph, female   DOB: April 06, 1941, 73 y.o.   MRN: [id number]     Subjective:  Drain Followup. She had a scan this morning showing a RLQ fluid area that was equivocal. A follow up scan in the PM with oral contrast proves that the fluid is the cecum and that the abscess has resolved.  Allergies: Codeine  Medications: Prior to Admission medications   Medication Sig Start Date End Date Taking? Authorizing Provider  flecainide (TAMBOCOR) 50 MG tablet Take 1 tablet (50 mg total) by mouth 2 (two) times daily. 04/05/14  Yes Josue Hector, MD  glipiZIDE (GLUCOTROL) 5 MG tablet Take 5 mg by mouth daily before breakfast.   Yes Historical Provider, MD  ibuprofen (ADVIL,MOTRIN) 200 MG tablet Take 400 mg by mouth every 6 (six) hours as needed for moderate pain.   Yes Historical Provider, MD  simvastatin (ZOCOR) 20 MG tablet Take 20 mg by mouth daily.   Yes Historical Provider, MD  valsartan-hydrochlorothiazide (DIOVAN-HCT) 160-25 MG per tablet Take 1 tablet by mouth daily.   Yes Historical Provider, MD  ondansetron (ZOFRAN) 4 MG tablet Take 1 tablet (4 mg total) by mouth every 8 (eight) hours as needed for nausea or vomiting. 04/07/14   Margarita Mail, PA-C  traMADol (ULTRAM) 50 MG tablet Take 1 tablet (50 mg total) by mouth every 6 (six) hours as needed. 04/07/14   Margarita Mail, PA-C    Review of Systems  Vital Signs: BP 160/84  Pulse 66  Temp(Src) 98.2 F (36.8 C) (Oral)  Resp 18  Ht 5' 3.75" (1.619 m)  Wt 197 lb (89.359 kg)  BMI 34.09 kg/m2  SpO2 97%  Physical Exam  Imaging: Ct Abdomen Pelvis W Contrast  04/16/2014   CLINICAL DATA:  Status post drain given abscess on 04/10/2014. Patient with fever. History of a total abdominal hysterectomy.  EXAM: CT ABDOMEN AND PELVIS WITH CONTRAST  TECHNIQUE: Multidetector CT imaging of the abdomen and pelvis was performed using the standard protocol following bolus administration of intravenous contrast.  CONTRAST:  176mL  OMNIPAQUE IOHEXOL 300 MG/ML  SOLN  COMPARISON:  CTs, 04/10/2014 and 04/09/2014.  FINDINGS: Pigtail catheter curls in the right central pelvis. The complex collection noted on the CT from 04/09/2014 has been evacuated. There is surrounding inflammatory type stranding, but no residual discrete fluid collection.  Uterus is surgically absent.  No adnexal masses.  No pathologically enlarged lymph nodes.  No generalized ascites.  Heart is mildly enlarged. There is mild subsegmental lung base atelectasis. No pleural effusions.  Small area of relative increased enhancement in the liver superficially, not evident on the delayed sequence. This is consistent with transient hepatic attenuation difference, incidental. Liver otherwise unremarkable.  Spleen, gallbladder, pancreas, adrenal glands:  Normal.  4 mm low-density lesion arises from the anterior lower pole of the left kidney, likely a cyst. Kidneys otherwise unremarkable. Normal ureters. Normal bladder.  There are scattered left colon diverticula. No diverticulitis. Mild increased stool is noted in the colon. There is no wall thickening or inflammatory changes. Small bowel is unremarkable. Normal appendix not discretely seen.  Mild degenerative changes of the lower thoracic and lower lumbar spine. No osteoblastic or osteolytic lesions.  IMPRESSION: 1. Right pelvic abscess is decompressed by the pigtail catheter inserted on 04/10/2014. No evidence of a recurrent or new abscess. There are inflammatory type changes surrounding the pigtail catheter adjacent to the cecal tip and right adnexa. No normal appendix is seen. As noted previously, the abscess  may have been from diverticulitis. An ruptured appendix is also possible since a normal appendix is not visualized. 2. No new acute abnormalities.  No evidence of bowel obstruction.   Electronically Signed   By: Lajean Manes M.D.   On: 04/16/2014 09:06    Labs:  CBC:  Recent Labs  04/10/14 0530 04/11/14 0515  04/12/14 0453 04/14/14 0605  WBC 12.0* 10.0 6.1 5.9  HGB 10.3* 10.5* 9.9* 10.0*  HCT 31.9* 32.4* 31.2* 30.9*  PLT 292 339 358 383    COAGS:  Recent Labs  04/10/14 1215  INR 1.23    BMP:  Recent Labs  04/10/14 0530 04/11/14 0515 04/12/14 0453 04/16/14  NA 139 139 143 139  K 3.7 3.4* 3.7 3.3*  CL 101 103 106 99  CO2 26 25 28 30   GLUCOSE 125* 106* 104* 150*  BUN 6 5* 4* 7  CALCIUM 8.4 8.4 8.3* 9.1  CREATININE 0.90 0.82 0.85 0.95  GFRNONAA 61* 68* 65* 57*  GFRAA 71* 79* 76* 66*    LIVER FUNCTION TESTS:  Recent Labs  04/07/14 1535 04/09/14 1740 04/09/14 2046 04/10/14 0530  BILITOT 0.6 0.8 0.6 0.7  AST 30 25 26 23   ALT 34 23 24 21   ALKPHOS 244* 189* 184* 170*  PROT 8.0 6.7 7.0 6.8  ALBUMIN 2.5* 2.8* 2.2* 2.2*    Assessment and Plan:  Resolved abscess. Drain remains in place.  Will do drain injection under fluoro tomorrow after oral contrast passes to rule out fistula. If there is no fistula, the drain can be removed.     Signed: Chavela Justiniano,ART A 04/16/2014, 12:33 PM

## 2014-04-16 NOTE — Progress Notes (Signed)
Patient seen and examined.  CT shows resolution of the abscess with inflammatory changes in the cecal area and right adnexa area.  DDx is perforated appendicitis with abscess vs diverticulitis with abscess.  Plan:  Drain study in radiology and if no communication with bowel, drain can be removed.  She can then be switched to oral antibiotics for 2 more weeks and follow up with Dr. Excell Seltzer in our office in 2 weeks (call [phone] to make appt.)

## 2014-04-16 NOTE — Progress Notes (Addendum)
INITIAL NUTRITION ASSESSMENT  DOCUMENTATION CODES Per approved criteria  -Obesity grade 1   INTERVENTION: Continue heart healthy diet Ensure Complete po once daily, each supplement provides 350 kcal and 13 grams of protein   NUTRITION DIAGNOSIS: Inadequate oral intake related to decreased appetite as evidenced by patient report .   Goal: Intake of meals and supplements to meet >90% estimated needs.  Monitor:  Intake, labs, weight trend  Reason for Assessment: Length of stay  73 y.o. female  Admitting Dx: Sepsis  ASSESSMENT: Patient admitted with abdominal pain and findings of pelvic abscess with possible sources including diverticulitis vs appendicitis.  Per radiology note, abscess has resolved s/p drain.   Drain injection to be done under fluoro tomorrow after oral contrast passes to rule out fistula.  If no fistula, the drain can be removed.   Patient reports decreased appetite and intake for the past month secondary to increased pain.  Intake fair currently with 50-75% of meals.  UBW 197 lbs per patient which is unchanged  Height: Ht Readings from Last 1 Encounters:  04/09/14 5' 3.75" (1.619 m)    Weight: Wt Readings from Last 1 Encounters:  04/09/14 197 lb (89.359 kg)    Ideal Body Weight: 120 lbs  % Ideal Body Weight: 164  Wt Readings from Last 10 Encounters:  04/09/14 197 lb (89.359 kg)  04/09/14 197 lb 6.4 oz (89.54 kg)  03/27/14 190 lb 6.4 oz (86.365 kg)  03/21/14 194 lb (87.998 kg)  02/11/14 196 lb 12.8 oz (89.268 kg)  12/03/13 194 lb 9.6 oz (88.27 kg)  09/07/13 196 lb (88.905 kg)  08/15/13 197 lb 9.6 oz (89.631 kg)  05/25/13 198 lb 12.8 oz (90.175 kg)  02/20/13 196 lb 3.2 oz (88.996 kg)    Usual Body Weight: 197 lbs  % Usual Body Weight: 100  BMI:  Body mass index is 34.09 kg/(m^2).  Estimated Nutritional Needs: Kcal: 1600-1800 Protein: 85-95 gm Fluid: >2L daily  Skin: intact  Diet Order: Cardiac  EDUCATION NEEDS: -Education needs  addressed   Intake/Output Summary (Last 24 hours) at 04/16/14 1402 Last data filed at 04/16/14 0645  Gross per 24 hour  Intake      5 ml  Output     10 ml  Net     -5 ml     Labs:   Recent Labs Lab 04/11/14 0515 04/12/14 0453 04/16/14  NA 139 143 139  K 3.4* 3.7 3.3*  CL 103 106 99  CO2 25 28 30   BUN 5* 4* 7  CREATININE 0.82 0.85 0.95  CALCIUM 8.4 8.3* 9.1  GLUCOSE 106* 104* 150*    CBG (last 3)   Recent Labs  04/15/14 2052 04/16/14 0734 04/16/14 1121  GLUCAP 236* 107* 113*    Scheduled Meds: . cephALEXin  500 mg Oral QID  . dextromethorphan  30 mg Oral BID  . flecainide  50 mg Oral BID  . heparin  5,000 Units Subcutaneous 3 times per day  . hydrochlorothiazide  25 mg Oral Daily  . insulin aspart  0-5 Units Subcutaneous QHS  . insulin aspart  0-9 Units Subcutaneous TID WC  . irbesartan  150 mg Oral Daily  . simvastatin  20 mg Oral Daily    Continuous Infusions:   Past Medical History  Diagnosis Date  . Hypertension   . Diabetes mellitus type II   . Syncope   . Bradycardia   . Weakness   . Cataract   . Osteoporosis  Past Surgical History  Procedure Laterality Date  . Eye surgery    . Cesarean section    . Abdominal hysterectomy      Antonieta Iba, RD, LDN Clinical Inpatient Dietitian Pager:  [phone] Weekend and after hours pager:  [phone]

## 2014-04-16 NOTE — Progress Notes (Addendum)
Patient ID: Catherine Joseph, female   DOB: Aug 27, 1940, 74 y.o.   MRN: [id number] [facility] PROGRESS NOTE  NAFEESAH LAPAGLIA RXY:[id number] DOB: 12-15-1940 DOA: 04/09/2014 PCP: Kennon Portela, MD  Brief narrative: 73 y.o. female with a PMH of hypertension and diabetes who was admitted on 04/09/14 with a chief complaint of abdominal pain associated with fevers and chills. Upon initial evaluation, a CT scan of the abdomen showed a large pelvic abscess with associated inflammatory stranding, thought to be diverticular in origin.   Assessment/Plan:   Principal Problem:  Sepsis secondary to intra-abdominal abscess/local perforation (diverticular)  Sepsis criteria met: T 103F, hypotension, respiratory rate 28 bpm, leukocytosis 14.3.  Secondary to intraabdominal abscess as noted on CT abd/pelvis. Pt is s/p CT guided drain placed 10/21, doing well and clinically stable (done by IR Dr. Anselm Pancoast). Cultures + for E. Coli. Repeat CT abdomen this am showed right pelvic abscess decompressed by the pigtail catheter inserted on 04/10/2014. No evidence of a recurrent or new abscess.The abscess may have been from diverticulitis. Ruptured appendix is also possible since a normal appendix is not visualized.  Appreciate surgery following and their recommendations.  Initially treated with vancomycin and Zosyn. Vancomycin discontinued 04/12/14, Unasyn substituted for Zosyn 04/13/14, now on PO Keflex.  Bood cultures remain negative to date.  Active Problems:  Hypertension Stable. Continue Diovan, Flecainide, HCTZ  Diabetes mellitus, type II, controlled, no mentioned of complications  Last Q2M 7.6 indicating relatively good glycemic control.  Continue SSI. Anemia of chronic disease  Drop in Hg since admission likely dilutional.  No evidence of bleeding.  Hypokalemia  Likely due to sepsis. Supplemented.  DVT Prophylaxis Continue heparin.  Code Status: Full.  Family Communication: Plan of care  discussed with the patient.  Disposition Plan: Home when stable.   IV Access:   Peripheral IV Procedures and diagnostic studies:   CXR 04/09/2014 Cardiomegaly without acute disease.   Ct Abd/Pelvis W/C 04/09/2014 Large pelvic abscess with associated inflammatory stranding throughout the pelvis and extending into the right lower quadrant and retroperitoneum. Small amount of free fluid in the pelvis. No free intraperitoneal air. This likely results from diverticulitis with local perforation and abscess formation. Appendix is not identified and abnormal appendix is not excluded.   04/10/2014 CT-guided drainage of the lower abdominal abscess.   Ct Pelvis Wo Contrast 04/16/2014  The right lower quadrant indeterminate full collection is now filled with oral contrast and is consistent with the cecum.  Abscess strain remains in place. Abscess has resolved. No new abscess. Inflammatory changes persist. Appendicolith is suspected. Underlying appendicitis may be persistent.    Ct Abdomen Pelvis W Contrast 04/16/2014  1. Right pelvic abscess is decompressed by the pigtail catheter inserted on 04/10/2014. No evidence of a recurrent or new abscess. There are inflammatory type changes surrounding the pigtail catheter adjacent to the cecal tip and right adnexa. No normal appendix is seen. As noted previously, the abscess may have been from diverticulitis. An ruptured appendix is also possible since a normal appendix is not visualized. 2. No new acute abnormalities.  No evidence of bowel obstruction.    Medical Consultants:   Dr. Excell Seltzer, Surgery  Dr. Jacqulynn Cadet, IR Anti-Infectives:   Vancomycin 04/09/14 --> 04/12/14  Zosyn 04/09/14-->04/13/14  Unasyn 04/13/14--->   Leisa Lenz, MD  [facility] Pager [phone]  If 7PM-7AM, please contact night-coverage [email or web address] Password [email or web address] 04/16/2014, 4:59 PM   LOS: 7 days    HPI/Subjective: No acute overnight  events.  Objective:  Filed Vitals:   04/15/14 1404 04/15/14 2200 04/16/14 0547 04/16/14 1411  BP: 149/83 164/72 160/84 148/77  Pulse: 63 57 66 58  Temp: 98.5 F (36.9 C) 98.4 F (36.9 C) 98.2 F (36.8 C) 98.3 F (36.8 C)  TempSrc: Oral Oral Oral Oral  Resp: _0 Height:      Weight:      SpO2: 98% 98% 97% 99%    Intake/Output Summary (Last 24 hours) at 04/16/14 1659 Last data filed at 04/16/14 1426  Gross per 24 hour  Intake      5 ml  Output     15 ml  Net    -10 ml    Exam:   General:  Pt is alert, follows commands appropriately, not in acute distress  Cardiovascular: Regular rate and rhythm, S1/S2, no murmurs  Respiratory: Clear to auscultation bilaterally, no wheezing, no crackles, no rhonchi  Abdomen: Soft, non tender, non distended, still has drain, bowel sounds present  Extremities: No edema, pulses DP and PT palpable bilaterally  Neuro: Grossly nonfocal  Data Reviewed: Basic Metabolic Panel:  Recent Labs Lab 04/09/14 2046 04/10/14 0530 04/11/14 0515 04/12/14 0453 04/16/14  NA 137 139 139 143 139  K 3.8 3.7 3.4* 3.7 3.3*  CL 100 101 103 106 99  CO2 _1 GLUCOSE 115* 125* 106* 104* 150*  BUN 7 6 5* 4* 7  CREATININE 0.88 0.90 0.82 0.85 0.95  CALCIUM 8.5 8.4 8.4 8.3* 9.1   Liver Function Tests:  Recent Labs Lab 04/09/14 1740 04/09/14 2046 04/10/14 0530  AST _2 ALT _3 ALKPHOS 189* 184* 170*  BILITOT 0.8 0.6 0.7  PROT 6.7 7.0 6.8  ALBUMIN 2.8* 2.2* 2.2*   No results found for this basename: LIPASE, AMYLASE,  in the last 168 hours No results found for this basename: AMMONIA,  in the last 168 hours CBC:  Recent Labs Lab 04/09/14 2046 04/10/14 0530 04/11/14 0515 04/12/14 0453 04/14/14 0605  WBC 11.6* 12.0* 10.0 6.1 5.9  NEUTROABS 9.5* 9.7*  --   --   --   HGB 10.7* 10.3* 10.5* 9.9* 10.0*  HCT 32.6* 31.9* 32.4* 31.2* 30.9*  MCV 83.6 83.9 84.4 84.1 83.3  PLT 285 292 339 358 383   Cardiac  Enzymes: No results found for this basename: CKTOTAL, CKMB, CKMBINDEX, TROPONINI,  in the last 168 hours BNP: No components found with this basename: POCBNP,  CBG:  Recent Labs Lab 04/15/14 1657 04/15/14 2052 04/16/14 0734 04/16/14 1121 04/16/14 1624  GLUCAP 156* 236* 107* 113* 192*    URINE CULTURE     Status: None   Collection Time    04/07/14  3:26 PM      Result Value Ref Range Status   Specimen Description URINE, CLEAN CATCH   Final   Value: Multiple bacterial morphotypes present, none predominant. Suggest appropriate recollection if clinically indicated.     Performed at [facility]   Report Status 04/09/2014 FINAL   Final  URINE CULTURE     Status: None   Collection Time    04/09/14  5:40 PM      Result Value Ref Range Status   Colony Count NO GROWTH   Final   Organism ID, Bacteria NO GROWTH   Final  CULTURE, BLOOD (ROUTINE X 2)     Status: None   Collection Time    04/09/14  8:46 PM  Result Value Ref Range Status   Specimen Description BLOOD LEFT ANTECUBITAL   Final   Value: NO GROWTH 5 DAYS     Performed at [facility]   Report Status 04/16/2014 FINAL   Final  CULTURE, BLOOD (ROUTINE X 2)     Status: None   Collection Time    04/09/14  8:46 PM      Result Value Ref Range Status   Specimen Description BLOOD LEFT HAND   Final   Value: NO GROWTH 5 DAYS     Performed at [facility]   Report Status 04/16/2014 FINAL   Final  URINE CULTURE     Status: None   Collection Time    04/09/14  9:20 PM      Result Value Ref Range Status   Specimen Description URINE, CLEAN CATCH   Final   Value: Multiple bacterial morphotypes present, none predominant. Suggest appropriate recollection if clinically indicated.     Performed at [facility]   Report Status 04/10/2014 FINAL   Final  CULTURE, ROUTINE-ABSCESS     Status: None   Collection Time    04/10/14  5:30 PM      Result Value Ref Range Status   Specimen Description PERITONEAL  CAVITY   Final   Value: MODERATE ESCHERICHIA COLI     Performed at [facility]   Report Status 04/13/2014 FINAL   Final   Organism ID, Bacteria ESCHERICHIA COLI   Final     Scheduled Meds: . cephALEXin  500 mg Oral QID  . dextromethorphan  30 mg Oral BID  . feeding supplement (ENSURE COMPLETE)  237 mL Oral Q2000  . flecainide  50 mg Oral BID  . heparin  5,000 Units Subcutaneous 3 times per day  . hydrochlorothiazide  25 mg Oral Daily  . insulin aspart  0-5 Units Subcutaneous QHS  . insulin aspart  0-9 Units Subcutaneous TID WC  . irbesartan  150 mg Oral Daily  . simvastatin  20 mg Oral Daily

## 2014-04-17 ENCOUNTER — Inpatient Hospital Stay (HOSPITAL_COMMUNITY): Payer: Medicare HMO

## 2014-04-17 LAB — CBC
HCT: 36.3 % (ref 36.0–46.0)
Hemoglobin: 11.9 g/dL — ABNORMAL LOW (ref 12.0–15.0)
MCH: 27.3 pg (ref 26.0–34.0)
MCHC: 32.8 g/dL (ref 30.0–36.0)
MCV: 83.3 fL (ref 78.0–100.0)
Platelets: 340 10*3/uL (ref 150–400)
RBC: 4.36 MIL/uL (ref 3.87–5.11)
RDW: 14.8 % (ref 11.5–15.5)
WBC: 5.6 10*3/uL (ref 4.0–10.5)

## 2014-04-17 LAB — BASIC METABOLIC PANEL WITH GFR
Anion gap: 13 (ref 5–15)
BUN: 10 mg/dL (ref 6–23)
CO2: 30 meq/L (ref 19–32)
Calcium: 9.3 mg/dL (ref 8.4–10.5)
Chloride: 96 meq/L (ref 96–112)
Creatinine, Ser: 1.07 mg/dL (ref 0.50–1.10)
GFR calc Af Amer: 57 mL/min — ABNORMAL LOW
GFR calc non Af Amer: 49 mL/min — ABNORMAL LOW
Glucose, Bld: 145 mg/dL — ABNORMAL HIGH (ref 70–99)
Potassium: 3.7 meq/L (ref 3.7–5.3)
Sodium: 139 meq/L (ref 137–147)

## 2014-04-17 LAB — GLUCOSE, CAPILLARY
Glucose-Capillary: 138 mg/dL — ABNORMAL HIGH (ref 70–99)
Glucose-Capillary: 180 mg/dL — ABNORMAL HIGH (ref 70–99)

## 2014-04-17 MED ORDER — METRONIDAZOLE 500 MG PO TABS
500.0000 mg | ORAL_TABLET | Freq: Three times a day (TID) | ORAL | Status: DC
  Administered 2014-04-17: 500 mg via ORAL
  Filled 2014-04-17 (×3): qty 1

## 2014-04-17 MED ORDER — SACCHAROMYCES BOULARDII 250 MG PO CAPS: 250.0000 mg | ORAL_CAPSULE | Freq: Two times a day (BID) | ORAL | Status: DC

## 2014-04-17 MED ORDER — ENSURE COMPLETE PO LIQD: 237.0000 mL | Freq: Every day | ORAL | Status: DC

## 2014-04-17 MED ORDER — AMOXICILLIN-POT CLAVULANATE 875-125 MG PO TABS
1.0000 | ORAL_TABLET | Freq: Two times a day (BID) | ORAL | Status: DC
  Administered 2014-04-17: 1 via ORAL
  Filled 2014-04-17 (×2): qty 1

## 2014-04-17 MED ORDER — AMOXICILLIN-POT CLAVULANATE 875-125 MG PO TABS: 1.0000 | ORAL_TABLET | Freq: Two times a day (BID) | ORAL | Status: DC

## 2014-04-17 MED ORDER — IOHEXOL 300 MG/ML  SOLN
25.0000 mL | Freq: Once | INTRAMUSCULAR | Status: AC | PRN
  Administered 2014-04-17: 25 mL

## 2014-04-17 MED ORDER — SACCHAROMYCES BOULARDII 250 MG PO CAPS
250.0000 mg | ORAL_CAPSULE | Freq: Two times a day (BID) | ORAL | Status: DC
  Administered 2014-04-17: 250 mg via ORAL
  Filled 2014-04-17 (×2): qty 1

## 2014-04-17 MED ORDER — METRONIDAZOLE 500 MG PO TABS: 500.0000 mg | ORAL_TABLET | Freq: Three times a day (TID) | ORAL | Status: DC

## 2014-04-17 NOTE — Progress Notes (Signed)
Discharge instructions given to pt, verbalized understanding. Left the unit in stable condition. 

## 2014-04-17 NOTE — Progress Notes (Signed)
Discussed with patient.  Agree.

## 2014-04-17 NOTE — Discharge Summary (Signed)
Physician Discharge Summary  Catherine Joseph:[id number] DOB: [birth year] DOA: 04/09/2014  PCP: Kennon Portela, MD  Admit date: 04/09/2014 Discharge date: 04/17/2014  Recommendations for Outpatient Follow-up:  1. Pt will follow up with radiation for drain removal. Follow up next week. 2. Pt will continue augmentin and flagyl for 2 weeks on discharge.  Discharge Diagnoses:  Principal Problem:   Sepsis Active Problems:   Intra-abdominal abscess   Hypertension   Diabetes mellitus, type II   Anemia of chronic disease    Discharge Condition: stable   Diet recommendation: as tolerated   History of present illness:  73 y.o. female with a PMH of hypertension and diabetes who was admitted on 04/09/14 with a chief complaint of abdominal pain associated with fevers and chills. Upon initial evaluation, a CT scan of the abdomen showed a large pelvic abscess with associated inflammatory stranding, thought to be diverticular in origin.   Assessment/Plan:   Principal Problem:  Sepsis secondary to intra-abdominal abscess/local perforation (diverticular)  Sepsis criteria met: T 103F, hypotension, respiratory rate 28 bpm, leukocytosis 14.3.  Secondary to intraabdominal abscess as noted on CT abd/pelvis. Pt is s/p CT guided drain placed 10/21, doing well and clinically stable (done by IR Dr. Anselm Pancoast). Cultures + for E. Coli. Repeat CT abdomen 04/16/2014 showed right pelvic abscess decompressed by the pigtail catheter inserted on 04/10/2014. No evidence of a recurrent or new abscess.The abscess may have been from diverticulitis. Ruptured appendix is also possible since a normal appendix is not visualized. 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 As mentioned above, pt will continue Augmentin and flagyl for 2 weeks on discharge and will follow up with IR for drain removal.  Initially treated with vancomycin and Zosyn. Vancomycin discontinued 04/12/14, Unasyn substituted for Zosyn 04/13/14,  then changed to PO Keflex. Pt reported intolerance to Keflex, GI indigestion. Augmentin and flagyl on discharge.  Bood cultures remain negative to date.  Active Problems:  Hypertension  Stable. Continue Diovan, Flecainide, HCTZ  Diabetes mellitus, type II, controlled, no mentioned of complications  Last B4W 7.6 indicating relatively good glycemic control.  Anemia of chronic disease  Drop in Hg since admission likely dilutional.  No evidence of bleeding.  Hypokalemia  Likely due to sepsis. Supplemented.  DVT Prophylaxis  Continue heparin while pt is in hospital.    Code Status: Full.  Family Communication: Plan of care discussed with the patient.  Disposition Plan: Home when stable.    IV Access:   Peripheral IV Procedures and diagnostic studies:   CXR 04/09/2014 Cardiomegaly without acute disease.  Ct Abd/Pelvis W/C 04/09/2014 Large pelvic abscess with associated inflammatory stranding throughout the pelvis and extending into the right lower quadrant and retroperitoneum. Small amount of free fluid in the pelvis. No free intraperitoneal air. This likely results from diverticulitis with local perforation and abscess formation. Appendix is not identified and abnormal appendix is not excluded.  04/10/2014 CT-guided drainage of the lower abdominal abscess.  Ct Pelvis Wo Contrast 04/16/2014 The right lower quadrant indeterminate full collection is now filled with oral contrast and is consistent with the cecum. Abscess strain remains in place. Abscess has resolved. No new abscess. Inflammatory changes persist. Appendicolith is suspected. Underlying appendicitis may be persistent.  Ct Abdomen Pelvis W Contrast 04/16/2014 1. Right pelvic abscess is decompressed by the pigtail catheter inserted on 04/10/2014. No evidence of a recurrent or new abscess. There are inflammatory type changes surrounding the pigtail catheter adjacent to the cecal tip and right adnexa. No  normal appendix is seen. As noted  previously, the abscess may have been from diverticulitis. An ruptured appendix is also possible since a normal appendix is not visualized. 2. No new acute abnormalities. No evidence of bowel obstruction.  Medical Consultants:   Dr. Excell Seltzer, Surgery  Dr. Jacqulynn Cadet, IR Anti-Infectives:   Vancomycin 04/09/14 --> 04/12/14  Zosyn 04/09/14-->04/13/14  Unasyn 04/13/14---> 04/16/2014 Keflex 04/16/2014 --> 04/17/2014 Augmentin and flagyl on discahrge for 2 weeks   Signed:  Leisa Lenz, MD  [facility] 04/17/2014, 2:19 PM  Pager #: [phone]   Discharge Exam: Filed Vitals:   04/17/14 1357  BP: 145/66  Pulse: 63  Temp: 98.5 F (36.9 C)  Resp: 18   Filed Vitals:   04/16/14 1411 04/16/14 2135 04/17/14 0521 04/17/14 1357  BP: 148/77 135/64 159/66 145/66  Pulse: 58 64 54 63  Temp: 98.3 F (36.8 C) 98.8 F (37.1 C) 98.6 F (37 C) 98.5 F (36.9 C)  TempSrc: Oral Oral Oral Oral  Resp: _0 Height:      Weight:      SpO2: 99% 98% 97% 99%    General: Pt is alert, follows commands appropriately, not in acute distress Cardiovascular: Regular rate and rhythm, S1/S2 +, no murmurs Respiratory: Clear to auscultation bilaterally, no wheezing, no crackles, no rhonchi Abdominal: Soft, non tender, non distended, bowel sounds +, no guarding; has drain place  Extremities: no edema, no cyanosis, pulses palpable bilaterally DP and PT Neuro: Grossly nonfocal  Discharge Instructions  Discharge Instructions   Call MD for:  difficulty breathing, headache or visual disturbances    Complete by:  As directed      Call MD for:  persistant dizziness or light-headedness    Complete by:  As directed      Call MD for:  persistant nausea and vomiting    Complete by:  As directed      Call MD for:  severe uncontrolled pain    Complete by:  As directed      Diet - low sodium heart healthy    Complete by:  As directed      Discharge instructions    Complete by:  As  directed   1. Follow up with interventional radiology per scheduled appt. 2. Continue Augmentin and flagyl for 2 weeks on discharge.     Increase activity slowly    Complete by:  As directed             Medication List         amoxicillin-clavulanate 875-125 MG per tablet  Commonly known as:  AUGMENTIN  Take 1 tablet by mouth every 12 (twelve) hours.     feeding supplement (ENSURE COMPLETE) Liqd  Take 237 mLs by mouth daily at 8 pm.     flecainide 50 MG tablet  Commonly known as:  TAMBOCOR  Take 1 tablet (50 mg total) by mouth 2 (two) times daily.     glipiZIDE 5 MG tablet  Commonly known as:  GLUCOTROL  Take 5 mg by mouth daily before breakfast.     ibuprofen 200 MG tablet  Commonly known as:  ADVIL,MOTRIN  Take 400 mg by mouth every 6 (six) hours as needed for moderate pain.     metroNIDAZOLE 500 MG tablet  Commonly known as:  FLAGYL  Take 1 tablet (500 mg total) by mouth every 8 (eight) hours.     ondansetron 4 MG tablet  Commonly known as:  ZOFRAN  Take  1 tablet (4 mg total) by mouth every 8 (eight) hours as needed for nausea or vomiting.     saccharomyces boulardii 250 MG capsule  Commonly known as:  FLORASTOR  Take 1 capsule (250 mg total) by mouth 2 (two) times daily.     simvastatin 20 MG tablet  Commonly known as:  ZOCOR  Take 20 mg by mouth daily.     traMADol 50 MG tablet  Commonly known as:  ULTRAM  Take 1 tablet (50 mg total) by mouth every 6 (six) hours as needed.     valsartan-hydrochlorothiazide 160-25 MG per tablet  Commonly known as:  DIOVAN-HCT  Take 1 tablet by mouth daily.           Follow-up Information   Follow up with Mariella Saa, MD On 04/26/2014. (Be at the office at 12:15 PM for a 12:30 appointment.)    Specialty:  General Surgery   Contact information:   [address] [place] [address] [phone]        The results of significant diagnostics from this hospitalization (including imaging,  microbiology, ancillary and laboratory) are listed below for reference.    Significant Diagnostic Studies: Ct Abdomen Pelvis Wo Contrast  03/27/2014   CLINICAL DATA:  10 hr history of sharp abdominal pain on right. Intermittent vomiting. Microhematuria on urinalysis  EXAM: CT ABDOMEN AND PELVIS WITHOUT CONTRAST  TECHNIQUE: Multidetector CT imaging of the abdomen and pelvis was performed following the standard protocol without oral or intravenous contrast material administration.  COMPARISON:  May 22, 2013  FINDINGS: There is patchy atelectatic change in the lung bases. There is no airspace consolidation in lung bases. Heart appears mildly enlarged.  No focal liver lesions are identified on this noncontrast enhanced study. Gallbladder wall is not thickened. There is no appreciable biliary duct dilatation.  Spleen, pancreas, and adrenals appear normal.  Kidneys bilaterally show no appreciable mass or hydronephrosis. There is no renal or ureteral calculus on either side. Note that there are several phleboliths near but felt to be separate from the mid to distal right ureter; these calcifications are stable compared to prior CT examination.  In the pelvis, the urinary bladder is midline with normal wall thickness. Uterus is absent. There is no pelvic mass or fluid collection. Appendix region appears normal. There are occasional sigmoid diverticula without demonstrable diverticulitis.  There is no bowel obstruction. No free air or portal venous air. There is no appreciable ascites, adenopathy, or abscess in the abdomen or pelvis. There is no demonstrable abdominal aortic aneurysm. There is atherosclerotic change in the aorta. There is a small ventral hernia containing only fat. There is degenerative change in the lumbar spine. No blastic or lytic bone lesions apparent. There is evidence of old trauma involving the left ischium.  IMPRESSION: No renal or ureteral calculi. No hydronephrosis. A cause for  microhematuria has not been established with this study.  No bowel obstruction.  No abscess.  Uterus absent. There is a small ventral hernia containing only fat. There is bibasilar lung atelectatic change. There is sigmoid diverticulosis without diverticulitis.   Electronically Signed   By: Bretta Bang M.D.   On: 03/27/2014 15:33   Dg Chest 2 View  04/09/2014   CLINICAL DATA:  Cough for 2 weeks.  EXAM: CHEST  2 VIEW  COMPARISON:  Single view of the chest 04/07/2014 and 03/27/2014. CT chest 02/26/2013.  FINDINGS: There is cardiomegaly without edema. No pneumothorax or pleural effusion.  IMPRESSION: Cardiomegaly  without acute disease.   Electronically Signed   By: Inge Rise M.D.   On: 04/09/2014 18:04   Ct Pelvis Wo Contrast  04/16/2014   CLINICAL DATA:  Right lower quadrant inflammatory process. CT scan performed this morning demonstrated and equivocal fluid collection in the right lower quadrant adjacent to the existing drain. This follow-up study with oral contrast is being performed 12 IIA weight that fluid collection.  EXAM: CT PELVIS WITHOUT CONTRAST  TECHNIQUE: Multidetector CT imaging of the pelvis was performed following the standard protocol without intravenous contrast.  COMPARISON:  None.  FINDINGS: The fluid collection in question is now filled with oral contrast and is consistent with a dilated cecum.  The abscess surrounding the drain has completely decompressed. There is stranding within the retroperitoneal fat surrounding the abscess compatible with inflammatory changes. A calculus is present within the loop of the abscess strain worrisome for an appendicolith.  No new abscess. No free-fluid. Adnexa are unremarkable. Uterus is absent.  IMPRESSION: The right lower quadrant indeterminate full collection is now filled with oral contrast and is consistent with the cecum.  Abscess strain remains in place. Abscess has resolved. No new abscess. Inflammatory changes persist. Appendicolith  is suspected. Underlying appendicitis may be persistent.   Electronically Signed   By: Maryclare Bean M.D.   On: 04/16/2014 13:13   US Abdomen Complete  04/07/2014   CLINICAL DATA:  Generalized abdominal pain.  EXAM: ULTRASOUND ABDOMEN COMPLETE  COMPARISON:  Radiography same day.  CT 03/27/2014  FINDINGS: Gallbladder: No gallstones or wall thickening visualized. No sonographic Murphy sign noted.  Common bile duct: Diameter: 6 mm, normal  Liver: No focal lesion identified. Within normal limits in parenchymal echogenicity.  IVC: No abnormality visualized.  Pancreas: Visualized portion unremarkable.  Spleen: Size and appearance within normal limits.  Length 8.0 cm.  Right Kidney: Length: 10.9 cm. Echogenicity within normal limits. No mass or hydronephrosis visualized.  Left Kidney: Length: 11.5 cm. Echogenicity within normal limits. No mass or hydronephrosis visualized.  Abdominal aorta: Atherosclerotic change but no aneurysm  Other findings: No ascites  IMPRESSION: No pathologic finding.   Electronically Signed   By: Nelson Chimes M.D.   On: 04/07/2014 20:27   Ct Abdomen Pelvis W Contrast  04/16/2014   CLINICAL DATA:  Status post drain given abscess on 04/10/2014. Patient with fever. History of a total abdominal hysterectomy.  EXAM: CT ABDOMEN AND PELVIS WITH CONTRAST  TECHNIQUE: Multidetector CT imaging of the abdomen and pelvis was performed using the standard protocol following bolus administration of intravenous contrast.  CONTRAST:  129mL OMNIPAQUE IOHEXOL 300 MG/ML  SOLN  COMPARISON:  CTs, 04/10/2014 and 04/09/2014.  FINDINGS: Pigtail catheter curls in the right central pelvis. The complex collection noted on the CT from 04/09/2014 has been evacuated. There is surrounding inflammatory type stranding, but no residual discrete fluid collection.  Uterus is surgically absent.  No adnexal masses.  No pathologically enlarged lymph nodes.  No generalized ascites.  Heart is mildly enlarged. There is mild subsegmental  lung base atelectasis. No pleural effusions.  Small area of relative increased enhancement in the liver superficially, not evident on the delayed sequence. This is consistent with transient hepatic attenuation difference, incidental. Liver otherwise unremarkable.  Spleen, gallbladder, pancreas, adrenal glands:  Normal.  4 mm low-density lesion arises from the anterior lower pole of the left kidney, likely a cyst. Kidneys otherwise unremarkable. Normal ureters. Normal bladder.  There are scattered left colon diverticula. No diverticulitis. Mild increased stool is  noted in the colon. There is no wall thickening or inflammatory changes. Small bowel is unremarkable. Normal appendix not discretely seen.  Mild degenerative changes of the lower thoracic and lower lumbar spine. No osteoblastic or osteolytic lesions.  IMPRESSION: 1. Right pelvic abscess is decompressed by the pigtail catheter inserted on 04/10/2014. No evidence of a recurrent or new abscess. There are inflammatory type changes surrounding the pigtail catheter adjacent to the cecal tip and right adnexa. No normal appendix is seen. As noted previously, the abscess may have been from diverticulitis. An ruptured appendix is also possible since a normal appendix is not visualized. 2. No new acute abnormalities.  No evidence of bowel obstruction.   Electronically Signed   By: Lajean Manes M.D.   On: 04/16/2014 09:06   Ct Abdomen Pelvis W Contrast  04/09/2014   CLINICAL DATA:  Progressive lower pelvic pain with nausea for 2 weeks. Fever.  EXAM: CT ABDOMEN AND PELVIS WITH CONTRAST  TECHNIQUE: Multidetector CT imaging of the abdomen and pelvis was performed using the standard protocol following bolus administration of intravenous contrast.  CONTRAST:  181mL OMNIPAQUE IOHEXOL 300 MG/ML SOLN, 7mL OMNIPAQUE IOHEXOL 300 MG/ML SOLN  COMPARISON:  03/27/2014  FINDINGS: Patchy airspace disease in the lung bases suggesting infiltration or edema. Minimal bilateral  pleural effusions.  The liver, spleen, gallbladder, pancreas, adrenal glands, kidneys, abdominal aorta, and inferior vena cava are unremarkable. There is inflammatory stranding in the retroperitoneum. Retroperitoneal lymph nodes are not pathologically enlarged and are likely reactive. Small accessory spleen. Portal veins appear patent. There are varices demonstrated in the left upper quadrant. Stomach, small bowel, and colon are not abnormally distended. Small umbilical hernia containing fat.  Pelvis: There is a large abscess in the anterior mid pelvis oriented slightly towards the right. The abscess measures 6.5 x 10.1 by 9.4 cm. There is extensive inflammatory stranding throughout the pelvis and extending up into the retroperitoneum. Smaller adjacent loculated fluid collections are present in the periphery of the main abscess. There is inflammatory stranding and thickening involving the sigmoid colon. Likely this represents locally perforated diverticulitis with diverticular abscess. No free intraperitoneal air is noted. Small amount of free fluid in the pelvis is likely reactive. Uterus appears surgically absent. No abnormal adnexal masses. Appendix is not identified. Degenerative changes in the lumbar spine. No destructive bone lesions appreciated.  IMPRESSION: Large pelvic abscess with associated inflammatory stranding throughout the pelvis and extending into the right lower quadrant and retroperitoneum. Small amount of free fluid in the pelvis. No free intraperitoneal air. This likely results from diverticulitis with local perforation and abscess formation. Appendix is not identified and abnormal appendix is not excluded.   Electronically Signed   By: Lucienne Capers M.D.   On: 04/09/2014 22:23   Dg Sinus/fist Tube Chk-non Gi  04/17/2014   CLINICAL DATA:  Diverticular abscess.  Abscess drainage tube check.  EXAM: ABSCESS INJECTION  TECHNIQUE: Under fluoroscopic guidance nonionic contrast was administered  into the right lower quadrant drainage tube. Spot images obtained.  CONTRAST:  18mL OMNIPAQUE IOHEXOL 300 MG/ML  SOLN  FLUOROSCOPY TIME:  0 min 43 seconds  COMPARISON:  CT 04/16/2014.  FINDINGS: Abscess drainage catheter in good anatomic position. Abscess cavity has diminished in size from prior CT of 04/16/2014 . Fistulization to the cecum is noted. Abscess drainage catheter left in place.  IMPRESSION: 1. Abscess drainage catheter in good anatomic position. Abscess cavity has diminished in size. 2. 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 Abscess drainage catheter left  in place.   Electronically Signed   By: Marcello Moores  Register   On: 04/17/2014 12:07   Dg Abd Acute W/chest  04/07/2014   CLINICAL DATA:  Abdominal pain for 2 weeks.  Vomiting last week.  EXAM: ACUTE ABDOMEN SERIES (ABDOMEN 2 VIEW & CHEST 1 VIEW)  COMPARISON:  CT abdomen and pelvis 03/27/2014. Chest in two views abdomen 03/27/2014.  FINDINGS: Single view of the chest demonstrates cardiomegaly without edema. No pneumothorax or pleural fluid.  Two views of the abdomen show no free intraperitoneal air. The bowel gas pattern is normal. No abnormal abdominal calcification is seen.  IMPRESSION: No acute finding chest or abdomen.  Cardiomegaly.   Electronically Signed   By: Inge Rise M.D.   On: 04/07/2014 18:38   Dg Abd Acute W/chest  03/27/2014   CLINICAL DATA:  Right lower quadrant pain, diarrhea  EXAM: ACUTE ABDOMEN SERIES (ABDOMEN 2 VIEW & CHEST 1 VIEW)  COMPARISON:  09/07/2013  FINDINGS: There is no evidence of dilated bowel loops or free intraperitoneal air. Moderate amount of stool throughout the colon. No radiopaque calculi or other significant radiographic abnormality is seen. There is mild stable cardiomegaly. Both lungs are clear.  IMPRESSION: Negative abdominal radiographs.  No acute cardiopulmonary disease.   Electronically Signed   By: Kathreen Devoid   On: 03/27/2014 13:47   Ct Image Guided Drainage By Percutaneous  Catheter  04/10/2014   CLINICAL DATA:  73 year old female with an abdominal abscess in the lower abdomen. Abscess is likely from a perforated appendicitis.  EXAM: CT GUIDED DRAINAGE OF LOWER ABDOMINAL ABSCESS  ANESTHESIA/SEDATION: 3 mg versed, 100 mcg fentanyl. A radiology nurse monitored the patient for moderate sedation.  Total Moderate Sedation Time:  25 minutes  PROCEDURE: The procedure was explained to the patient. The risks and benefits of the procedure were discussed and the patient's questions were addressed. Informed consent was obtained from the patient. The patient was placed on the CT scanner and the right side was mildly elevated. CT images through the lower abdomen and pelvis were obtained. The right side of the abdomen was prepped and draped in sterile fashion. Skin and soft tissues were anesthetized with 1% lidocaine. An 18 gauge needle was directed towards the intra-abdominal abscess with CT guidance. Foul-smelling yellow purulent fluid was aspirated from the needle. A stiff Amplatz wire was advanced into the collection. The tract was dilated and a 10 [place] multipurpose drain was placed. 75 mL of purulent fluid was removed. Sample was sent for culture. Catheter was attached to a suction bulb and sutured to the skin.  COMPLICATIONS: None  FINDINGS: Large abscess in the lower abdomen. There is a calcification along the superior aspect of the collection which is most compatible with an appendicolith. There is wall thickening in the sigmoid colon and adjacent small bowel loops.  IMPRESSION: CT-guided drainage of the lower abdominal abscess.   Electronically Signed   By: Markus Daft M.D.   On: 04/10/2014 17:53    Microbiology: Recent Results (from the past 240 hour(s))  URINE CULTURE     Status: None   Collection Time    04/07/14  3:26 PM      Result Value Ref Range Status   Specimen Description URINE, CLEAN CATCH   Final   Special Requests NONE   Final   Culture  Setup Time     Final    Value: 04/08/2014 08:54     Performed at [facility]  Final   Value: 20,OOO COLONIES/ML     Performed at [facility]   Culture     Final   Value: Multiple bacterial morphotypes present, none predominant. Suggest appropriate recollection if clinically indicated.     Performed at [facility]   Report Status 04/09/2014 FINAL   Final  URINE CULTURE     Status: None   Collection Time    04/09/14  5:40 PM      Result Value Ref Range Status   Colony Count NO GROWTH   Final   Organism ID, Bacteria NO GROWTH   Final  CULTURE, BLOOD (ROUTINE X 2)     Status: None   Collection Time    04/09/14  8:46 PM      Result Value Ref Range Status   Specimen Description BLOOD LEFT ANTECUBITAL   Final   Special Requests BOTTLES DRAWN AEROBIC AND ANAEROBIC 5ML   Final   Culture  Setup Time     Final   Value: 04/10/2014 00:35     Performed at [facility]   Culture     Final   Value: NO GROWTH 5 DAYS     Performed at [facility]   Report Status 04/16/2014 FINAL   Final  CULTURE, BLOOD (ROUTINE X 2)     Status: None   Collection Time    04/09/14  8:46 PM      Result Value Ref Range Status   Specimen Description BLOOD LEFT HAND   Final   Special Requests BOTTLES DRAWN AEROBIC AND ANAEROBIC 5ML   Final   Culture  Setup Time     Final   Value: 04/10/2014 00:35     Performed at [facility]   Culture     Final   Value: NO GROWTH 5 DAYS     Performed at [facility]   Report Status 04/16/2014 FINAL   Final  URINE CULTURE     Status: None   Collection Time    04/09/14  9:20 PM      Result Value Ref Range Status   Specimen Description URINE, CLEAN CATCH   Final   Special Requests NONE   Final   Culture  Setup Time     Final   Value: 04/10/2014 00:53     Performed at [place]     Final   Value: 30,000 COLONIES/ML     Performed at [facility]   Culture     Final   Value: Multiple  bacterial morphotypes present, none predominant. Suggest appropriate recollection if clinically indicated.     Performed at [facility]   Report Status 04/10/2014 FINAL   Final  CULTURE, ROUTINE-ABSCESS     Status: None   Collection Time    04/10/14  5:30 PM      Result Value Ref Range Status   Specimen Description PERITONEAL CAVITY   Final   Special Requests NONE   Final   Gram Stain     Final   Value: ABUNDANT WBC PRESENT,BOTH PMN AND MONONUCLEAR     NO SQUAMOUS EPITHELIAL CELLS SEEN     ABUNDANT GRAM NEGATIVE RODS     Performed at [facility]   Culture     Final   Value: MODERATE ESCHERICHIA COLI     Performed at [facility]   Report Status 04/13/2014 FINAL   Final   Organism ID, Bacteria ESCHERICHIA  COLI   Final     Labs: Basic Metabolic Panel:  Recent Labs Lab 04/11/14 0515 04/12/14 0453 04/16/14 04/17/14 0955  NA 139 143 139 139  K 3.4* 3.7 3.3* 3.7  CL 103 106 99 96  CO2 _0 GLUCOSE 106* 104* 150* 145*  BUN 5* 4* 7 10  CREATININE 0.82 0.85 0.95 1.07  CALCIUM 8.4 8.3* 9.1 9.3   Liver Function Tests: No results found for this basename: AST, ALT, ALKPHOS, BILITOT, PROT, ALBUMIN,  in the last 168 hours No results found for this basename: LIPASE, AMYLASE,  in the last 168 hours No results found for this basename: AMMONIA,  in the last 168 hours CBC:  Recent Labs Lab 04/11/14 0515 04/12/14 0453 04/14/14 0605 04/17/14 0955  WBC 10.0 6.1 5.9 5.6  HGB 10.5* 9.9* 10.0* 11.9*  HCT 32.4* 31.2* 30.9* 36.3  MCV 84.4 84.1 83.3 83.3  PLT 339 358 383 340   Cardiac Enzymes: No results found for this basename: CKTOTAL, CKMB, CKMBINDEX, TROPONINI,  in the last 168 hours BNP: BNP (last 3 results) No results found for this basename: PROBNP,  in the last 8760 hours CBG:  Recent Labs Lab 04/16/14 1121 04/16/14 1624 04/16/14 2154 04/17/14 0758 04/17/14 1146  GLUCAP 113* 192* 258* 138* 180*    Time coordinating discharge: Over  30 minutes

## 2014-04-17 NOTE — Progress Notes (Addendum)
Subjective: Her complaints this AM include;  being hungry, sore at drain site and weak which she attributes to the antibiotics.  Objective: Vital signs in last 24 hours: Temp:  [98.3 F (36.8 C)-98.8 F (37.1 C)] 98.6 F (37 C) (10/28 0521) Pulse Rate:  [54-64] 54 (10/28 0521) Resp:  [18] 18 (10/28 0521) BP: (135-159)/(64-77) 159/66 mmHg (10/28 0521) SpO2:  [97 %-99 %] 97 % (10/28 0521) Last BM Date: 04/15/14 Cardiac diet ordered, I'm not sure why she hasn't eaten this AM.  IR says she can have medicines, but hold off on breakfast till after injection, they plan to do at 10 AM. Afebrile, VSS 15 ml recorded from the drain. No labs and I have ordered some for this AM. Intake/Output from previous day: 10/27 0701 - 10/28 0700 In: 5  Out: 15 [Drains:15] Intake/Output this shift:    General appearance: alert, cooperative, no distress and hungry. GI: soft, non-tender; bowel sounds normal; no masses,  no organomegaly and sore at drain site.  Lab Results:  No results found for this basename: WBC, HGB, HCT, PLT,  in the last 72 hours  BMET  Recent Labs  04/16/14  NA 139  K 3.3*  CL 99  CO2 30  GLUCOSE 150*  BUN 7  CREATININE 0.95  CALCIUM 9.1   PT/INR No results found for this basename: LABPROT, INR,  in the last 72 hours  No results found for this basename: AST, ALT, ALKPHOS, BILITOT, PROT, ALBUMIN,  in the last 168 hours   Lipase     Component Value Date/Time   LIPASE 27 04/07/2014 1535     Studies/Results: Ct Pelvis Wo Contrast  04/16/2014   CLINICAL DATA:  Right lower quadrant inflammatory process. CT scan performed this morning demonstrated and equivocal fluid collection in the right lower quadrant adjacent to the existing drain. This follow-up study with oral contrast is being performed 12 IIA weight that fluid collection.  EXAM: CT PELVIS WITHOUT CONTRAST  TECHNIQUE: Multidetector CT imaging of the pelvis was performed following the standard protocol without  intravenous contrast.  COMPARISON:  None.  FINDINGS: The fluid collection in question is now filled with oral contrast and is consistent with a dilated cecum.  The abscess surrounding the drain has completely decompressed. There is stranding within the retroperitoneal fat surrounding the abscess compatible with inflammatory changes. A calculus is present within the loop of the abscess strain worrisome for an appendicolith.  No new abscess. No free-fluid. Adnexa are unremarkable. Uterus is absent.  IMPRESSION: The right lower quadrant indeterminate full collection is now filled with oral contrast and is consistent with the cecum.  Abscess strain remains in place. Abscess has resolved. No new abscess. Inflammatory changes persist. Appendicolith is suspected. Underlying appendicitis may be persistent.   Electronically Signed   By: Catherine Joseph M.D.   On: 04/16/2014 13:13   Ct Abdomen Pelvis W Contrast  04/16/2014   CLINICAL DATA:  Status post drain given abscess on 04/10/2014. Patient with fever. History of a total abdominal hysterectomy.  EXAM: CT ABDOMEN AND PELVIS WITH CONTRAST  TECHNIQUE: Multidetector CT imaging of the abdomen and pelvis was performed using the standard protocol following bolus administration of intravenous contrast.  CONTRAST:  123mL OMNIPAQUE IOHEXOL 300 MG/ML  SOLN  COMPARISON:  CTs, 04/10/2014 and 04/09/2014.  FINDINGS: Pigtail catheter curls in the right central pelvis. The complex collection noted on the CT from 04/09/2014 has been evacuated. There is surrounding inflammatory type stranding, but no residual discrete fluid  collection.  Uterus is surgically absent.  No adnexal masses.  No pathologically enlarged lymph nodes.  No generalized ascites.  Heart is mildly enlarged. There is mild subsegmental lung base atelectasis. No pleural effusions.  Small area of relative increased enhancement in the liver superficially, not evident on the delayed sequence. This is consistent with transient  hepatic attenuation difference, incidental. Liver otherwise unremarkable.  Spleen, gallbladder, pancreas, adrenal glands:  Normal.  4 mm low-density lesion arises from the anterior lower pole of the left kidney, likely a cyst. Kidneys otherwise unremarkable. Normal ureters. Normal bladder.  There are scattered left colon diverticula. No diverticulitis. Mild increased stool is noted in the colon. There is no wall thickening or inflammatory changes. Small bowel is unremarkable. Normal appendix not discretely seen.  Mild degenerative changes of the lower thoracic and lower lumbar spine. No osteoblastic or osteolytic lesions.  IMPRESSION: 1. Right pelvic abscess is decompressed by the pigtail catheter inserted on 04/10/2014. No evidence of a recurrent or new abscess. There are inflammatory type changes surrounding the pigtail catheter adjacent to the cecal tip and right adnexa. No normal appendix is seen. As noted previously, the abscess may have been from diverticulitis. An ruptured appendix is also possible since a normal appendix is not visualized. 2. No new acute abnormalities.  No evidence of bowel obstruction.   Electronically Signed   By: Lajean Manes M.D.   On: 04/16/2014 09:06    Medications: . cephALEXin  500 mg Oral QID  . dextromethorphan  30 mg Oral BID  . feeding supplement (ENSURE COMPLETE)  237 mL Oral Q2000  . flecainide  50 mg Oral BID  . heparin  5,000 Units Subcutaneous 3 times per day  . hydrochlorothiazide  25 mg Oral Daily  . insulin aspart  0-5 Units Subcutaneous QHS  . insulin aspart  0-9 Units Subcutaneous TID WC  . irbesartan  150 mg Oral Daily  . simvastatin  20 mg Oral Daily    Assessment/Plan Intraabdominal abscess, appendicitis versus diverticulitis; S/p CT guided drainage of lower abdominal abscess. 75 ml of foul smelling purulent yellow fluid removed. 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  If no fistula and site well drained we hope to pull drain and let her go home on antibiotics later today.  She can follow up with Dr. Excell Seltzer in 2 weeks.   Drain injection shows:  1. Abscess drainage catheter in good anatomic position. Abscess cavity has diminished in size. 2. 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 Abscess drainage catheter left in place.  CBC Latest Ref Rng 04/17/2014 04/14/2014 04/12/2014  WBC 4.0 - 10.5 K/uL 5.6 5.9 6.1  Hemoglobin 12.0 - 15.0 g/dL 11.9(L) 10.0(L) 9.9(L)  Hematocrit 36.0 - 46.0 % 36.3 30.9(L) 31.2(L)  Platelets 150 - 400 K/uL 340 383 358   CMP     Component Value Date/Time   NA 139 04/17/2014 0955   K 3.7 04/17/2014 0955   CL 96 04/17/2014 0955   CO2 30 04/17/2014 0955   GLUCOSE 145* 04/17/2014 0955   BUN 10 04/17/2014 0955   CREATININE 1.07 04/17/2014 0955   CREATININE 0.86 04/09/2014 1740   CALCIUM 9.3 04/17/2014 0955   PROT  6.8 04/10/2014 0530   ALBUMIN 2.2* 04/10/2014 0530   AST 23 04/10/2014 0530   ALT 21 04/10/2014 0530   ALKPHOS 170* 04/10/2014 0530   BILITOT 0.7 04/10/2014 0530   GFRNONAA 49* 04/17/2014 0955   GFRAA 57* 04/17/2014 0955       I will discussed with Dr. Zella Richer.  We recommend home with drain and combination of Augmentin/Flagyl and probiotic.  I will work on getting her a follow up with IR and have the fistula rechecked next week.  Follow up with Dr. Excell Seltzer in 2 weeks.  Low fiber diet.  I have talked with IR and am contacting Dr. Charlies Silvers.   LOS: 8 days    Catherine Joseph 04/17/2014

## 2014-04-17 NOTE — Progress Notes (Signed)
Drain study today.  If negative, will have drain removed.  If positive keep drain in.  Either way, she can be discharged on oral abxs today and f/u with Dr. Excell Seltzer in the office.  Discussed with her daughter.

## 2014-04-17 NOTE — Discharge Instructions (Signed)
Diverticulitis Diverticulitis is inflammation or infection of small pouches in your colon that form when you have a condition called diverticulosis. The pouches in your colon are called diverticula. Your colon, or large intestine, is where water is absorbed and stool is formed. Complications of diverticulitis can include:  Bleeding.  Severe infection.  Severe pain.  Perforation of your colon. (YOU STILL HAVE A SMALL HOLE IN YOUR COLON AND THAT IS WHY WE ARE LEAVING THE DRAIN IN)  Obstruction of your colon. CAUSES  Diverticulitis is caused by bacteria. Diverticulitis happens when stool becomes trapped in diverticula. This allows bacteria to grow in the diverticula, which can lead to inflammation and infection. RISK FACTORS People with diverticulosis are at risk for diverticulitis. Eating a diet that does not include enough fiber from fruits and vegetables may make diverticulitis more likely to develop. SYMPTOMS  Symptoms of diverticulitis may include:  Abdominal pain and tenderness. The pain is normally located on the left side of the abdomen, but may occur in other areas.  Fever and chills.  Bloating.  Cramping.  Nausea.  Vomiting.  Constipation.  Diarrhea.  Blood in your stool. DIAGNOSIS  Your health care provider will ask you about your medical history and do a physical exam. You may need to have tests done because many medical conditions can cause the same symptoms as diverticulitis. Tests may include:  Blood tests.  Urine tests.  Imaging tests of the abdomen, including X-rays and CT scans. When your condition is under control, your health care provider may recommend that you have a colonoscopy. A colonoscopy can show how severe your diverticula are and whether something else is causing your symptoms. TREATMENT  Most cases of diverticulitis are mild and can be treated at home. Treatment may include:  Taking over-the-counter pain medicines.  Following a clear  liquid diet.  Taking antibiotic medicines by mouth for 7-10 days. More severe cases may be treated at a hospital. Treatment may include:  Not eating or drinking.  Taking prescription pain medicine.  Receiving antibiotic medicines through an IV tube.  Receiving fluids and nutrition through an IV tube.  Surgery. HOME CARE INSTRUCTIONS   Follow your health care provider's instructions carefully.  Follow a full liquid diet or other diet as directed by your health care provider. After your symptoms improve, your health care provider may tell you to change your diet. He or she may recommend you eat a high-fiber diet. Fruits and vegetables are good sources of fiber. Fiber makes it easier to pass stool.  Take fiber supplements or probiotics as directed by your health care provider.  Only take medicines as directed by your health care provider.  Keep all your follow-up appointments. SEEK MEDICAL CARE IF:   Your pain does not improve.  You have a hard time eating food.  Your bowel movements do not return to normal. SEEK IMMEDIATE MEDICAL CARE IF:   Your pain becomes worse.  Your symptoms do not get better.  Your symptoms suddenly get worse.  You have a fever.  You have repeated vomiting.  You have bloody or black, tarry stools. MAKE SURE YOU:   Understand these instructions.  Will watch your condition.  Will get help right away if you are not doing well or get worse. Document Released: 03/17/2005 Document Revised: 06/12/2013 Document Reviewed: 05/02/2013 [facility] Patient Information 2015 [place], [place]. This information is not intended to replace advice given to you by your health care provider. Make sure you discuss any questions you have  with your health care provider.  Low-Fiber Diet or Full liquids Fiber is found in fruits, vegetables, and whole grains. A low-fiber diet restricts fibrous foods that are not digested in the small intestine. A diet containing about  10-15 grams of fiber per day is considered low fiber. Low-fiber diets may be used to:  Promote healing and rest the bowel during intestinal flare-ups.  Prevent blockage of a partially obstructed or narrowed gastrointestinal tract.  Reduce fecal weight and volume.  Slow the movement of feces. You may be on a low-fiber diet as a transitional diet following surgery, after an injury (trauma), or because of a short (acute) or lifelong (chronic) illness. Your health care provider will determine the length of time you need to stay on this diet.  WHAT DO I NEED TO KNOW ABOUT A LOW-FIBER DIET? Always check the fiber content on the packaging's Nutrition Facts label, especially on foods from the grains list. Ask your dietitian if you have questions about specific foods that are related to your condition, especially if the food is not listed below. In general, a low-fiber food will have less than 2 g of fiber. WHAT FOODS CAN I EAT? Grains All breads and crackers made with white flour. Sweet rolls, doughnuts, waffles, pancakes, [place] toast, bagels. Pretzels, Melba toast, zwieback. Well-cooked cereals, such as cornmeal, farina, or cream cereals. Dry cereals that do not contain whole grains, fruit, or nuts, such as refined corn, wheat, rice, and oat cereals. Potatoes prepared any way without skins, plain pastas and noodles, refined white rice. Use white flour for baking and making sauces. Use allowed list of grains for casseroles, dumplings, and puddings.  Vegetables Strained tomato and vegetable juices. Fresh lettuce, cucumber, spinach. Well-cooked (no skin or pulp) or canned vegetables, such as asparagus, bean sprouts, beets, carrots, green beans, mushrooms, potatoes, pumpkin, spinach, yellow squash, tomato sauce/puree, turnips, yams, and zucchini. Keep servings limited to  cup.  Fruits All fruit juices except prune juice. Cooked or canned fruits without skin and seeds, such as applesauce, apricots, cherries,  fruit cocktail, grapefruit, grapes, mandarin oranges, melons, peaches, pears, pineapple, and plums. Fresh fruits without skin, such as apricots, avocados, bananas, melons, pineapple, nectarines, and peaches. Keep servings limited to  cup or 1 piece.  Meat and Other Protein Sources Ground or well-cooked tender beef, ham, veal, lamb, pork, or poultry. Eggs, plain cheese. Fish, oysters, shrimp, lobster, and other seafood. Liver, organ meats. Smooth nut butters. Dairy All milk products and alternative dairy substitutes, such as soy, rice, almond, and coconut, not containing added whole nuts, seeds, or added fruit. Beverages Decaf coffee, fruit, and vegetable juices or smoothies (small amounts, with no pulp or skins, and with fruits from allowed list), sports drinks, herbal tea. Condiments Ketchup, mustard, vinegar, cream sauce, cheese sauce, cocoa powder. Spices in moderation, such as allspice, basil, bay leaves, celery powder or leaves, cinnamon, cumin powder, curry powder, ginger, mace, marjoram, onion or garlic powder, oregano, paprika, parsley flakes, ground pepper, rosemary, sage, savory, tarragon, thyme, and turmeric. Sweets and Desserts Plain cakes and cookies, pie made with allowed fruit, pudding, custard, cream pie. Gelatin, fruit, ice, sherbet, frozen ice pops. Ice cream, ice milk without nuts. Plain hard candy, honey, jelly, molasses, syrup, sugar, chocolate syrup, gumdrops, marshmallows. Limit overall sugar intake.  Fats and Oil Margarine, butter, cream, mayonnaise, salad oils, plain salad dressings made from allowed foods. Choose healthy fats such as olive oil, canola oil, and omega-3 fatty acids (such as found in salmon or tuna)  when possible.  Other Bouillon, broth, or cream soups made from allowed foods. Any strained soup. Casseroles or mixed dishes made with allowed foods. The items listed above may not be a complete list of recommended foods or beverages. Contact your dietitian for  more options.  WHAT FOODS ARE NOT RECOMMENDED? Grains All whole wheat and whole grain breads and crackers. Multigrains, rye, bran seeds, nuts, or coconut. Cereals containing whole grains, multigrains, bran, coconut, nuts, raisins. Cooked or dry oatmeal, steel-cut oats. Coarse wheat cereals, granola. Cereals advertised as high fiber. Potato skins. Whole grain pasta, wild or brown rice. Popcorn. Coconut flour. Bran, buckwheat, corn bread, multigrains, rye, wheat germ.  Vegetables Fresh, cooked or canned vegetables, such as artichokes, asparagus, beet greens, broccoli, Brussels sprouts, cabbage, celery, cauliflower, corn, eggplant, kale, legumes or beans, okra, peas, and tomatoes. Avoid large servings of any vegetables, especially raw vegetables.  Fruits Fresh fruits, such as apples with or without skin, berries, cherries, figs, grapes, grapefruit, guavas, kiwis, mangoes, oranges, papayas, pears, persimmons, pineapple, and pomegranate. Prune juice and juices with pulp, stewed or dried prunes. Dried fruits, dates, raisins. Fruit seeds or skins. Avoid large servings of all fresh fruits. Meats and Other Protein Sources Tough, fibrous meats with gristle. Chunky nut butter. Cheese made with seeds, nuts, or other foods not recommended. Nuts, seeds, legumes (beans, including baked beans), dried peas, beans, lentils.  Dairy Yogurt or cheese that contains nuts, seeds, or added fruit.  Beverages Fruit juices with high pulp, prune juice. Caffeinated coffee and teas.  Condiments Coconut, maple syrup, pickles, olives. Sweets and Desserts Desserts, cookies, or candies that contain nuts or coconut, chunky peanut butter, dried fruits. Jams, preserves with seeds, marmalade. Large amounts of sugar and sweets. Any other dessert made with fruits from the not recommended list.  Other Soups made from vegetables that are not recommended or that contain other foods not recommended.  The items listed above may not be a  complete list of foods and beverages to avoid. Contact your dietitian for more information. Document Released: 11/27/2001 Document Revised: 06/12/2013 Document Reviewed: 04/30/2013 [facility] Patient Information 2015 [place], [place]. This information is not intended to replace advice given to you by your health care provider. Make sure you discuss any questions you have with your health care provider.

## 2014-04-17 NOTE — Progress Notes (Signed)
Patient ID: Catherine Joseph, female   DOB: 09-25-1940, 73 y.o.   MRN: [id number]   Referring Physician(s): CCS Subjective:  Pt doing well; no new c/o; awaiting d/c home today  Allergies: Codeine  Medications: Prior to Admission medications   Medication Sig Start Date End Date Taking? Authorizing Provider  flecainide (TAMBOCOR) 50 MG tablet Take 1 tablet (50 mg total) by mouth 2 (two) times daily. 04/05/14  Yes Josue Hector, MD  glipiZIDE (GLUCOTROL) 5 MG tablet Take 5 mg by mouth daily before breakfast.   Yes Historical Provider, MD  ibuprofen (ADVIL,MOTRIN) 200 MG tablet Take 400 mg by mouth every 6 (six) hours as needed for moderate pain.   Yes Historical Provider, MD  simvastatin (ZOCOR) 20 MG tablet Take 20 mg by mouth daily.   Yes Historical Provider, MD  valsartan-hydrochlorothiazide (DIOVAN-HCT) 160-25 MG per tablet Take 1 tablet by mouth daily.   Yes Historical Provider, MD  amoxicillin-clavulanate (AUGMENTIN) 875-125 MG per tablet Take 1 tablet by mouth every 12 (twelve) hours. 04/17/14   Robbie Lis, MD  feeding supplement, ENSURE COMPLETE, (ENSURE COMPLETE) LIQD Take 237 mLs by mouth daily at 8 pm. 04/17/14   Robbie Lis, MD  metroNIDAZOLE (FLAGYL) 500 MG tablet Take 1 tablet (500 mg total) by mouth every 8 (eight) hours. 04/17/14   Robbie Lis, MD  ondansetron (ZOFRAN) 4 MG tablet Take 1 tablet (4 mg total) by mouth every 8 (eight) hours as needed for nausea or vomiting. 04/07/14   Margarita Mail, PA-C  saccharomyces boulardii (FLORASTOR) 250 MG capsule Take 1 capsule (250 mg total) by mouth 2 (two) times daily. 04/17/14   Robbie Lis, MD  traMADol (ULTRAM) 50 MG tablet Take 1 tablet (50 mg total) by mouth every 6 (six) hours as needed. 04/07/14   Margarita Mail, PA-C    Review of Systems see above  Vital Signs: BP 145/66  Pulse 63  Temp(Src) 98.5 F (36.9 C) (Oral)  Resp 18  Ht 5' 3.75" (1.619 m)  Wt 197 lb (89.359 kg)  BMI 34.09 kg/m2  SpO2  99%  Physical Exam RLQ drain intact, output 15 cc's; insertion site ok, minimal tenderness  Imaging: Ct Pelvis Wo Contrast  04/16/2014   CLINICAL DATA:  Right lower quadrant inflammatory process. CT scan performed this morning demonstrated and equivocal fluid collection in the right lower quadrant adjacent to the existing drain. This follow-up study with oral contrast is being performed 12 IIA weight that fluid collection.  EXAM: CT PELVIS WITHOUT CONTRAST  TECHNIQUE: Multidetector CT imaging of the pelvis was performed following the standard protocol without intravenous contrast.  COMPARISON:  None.  FINDINGS: The fluid collection in question is now filled with oral contrast and is consistent with a dilated cecum.  The abscess surrounding the drain has completely decompressed. There is stranding within the retroperitoneal fat surrounding the abscess compatible with inflammatory changes. A calculus is present within the loop of the abscess strain worrisome for an appendicolith.  No new abscess. No free-fluid. Adnexa are unremarkable. Uterus is absent.  IMPRESSION: The right lower quadrant indeterminate full collection is now filled with oral contrast and is consistent with the cecum.  Abscess strain remains in place. Abscess has resolved. No new abscess. Inflammatory changes persist. Appendicolith is suspected. Underlying appendicitis may be persistent.   Electronically Signed   By: Maryclare Bean M.D.   On: 04/16/2014 13:13   Ct Abdomen Pelvis W Contrast  04/16/2014   CLINICAL DATA:  Status  post drain given abscess on 04/10/2014. Patient with fever. History of a total abdominal hysterectomy.  EXAM: CT ABDOMEN AND PELVIS WITH CONTRAST  TECHNIQUE: Multidetector CT imaging of the abdomen and pelvis was performed using the standard protocol following bolus administration of intravenous contrast.  CONTRAST:  159mL OMNIPAQUE IOHEXOL 300 MG/ML  SOLN  COMPARISON:  CTs, 04/10/2014 and 04/09/2014.  FINDINGS: Pigtail  catheter curls in the right central pelvis. The complex collection noted on the CT from 04/09/2014 has been evacuated. There is surrounding inflammatory type stranding, but no residual discrete fluid collection.  Uterus is surgically absent.  No adnexal masses.  No pathologically enlarged lymph nodes.  No generalized ascites.  Heart is mildly enlarged. There is mild subsegmental lung base atelectasis. No pleural effusions.  Small area of relative increased enhancement in the liver superficially, not evident on the delayed sequence. This is consistent with transient hepatic attenuation difference, incidental. Liver otherwise unremarkable.  Spleen, gallbladder, pancreas, adrenal glands:  Normal.  4 mm low-density lesion arises from the anterior lower pole of the left kidney, likely a cyst. Kidneys otherwise unremarkable. Normal ureters. Normal bladder.  There are scattered left colon diverticula. No diverticulitis. Mild increased stool is noted in the colon. There is no wall thickening or inflammatory changes. Small bowel is unremarkable. Normal appendix not discretely seen.  Mild degenerative changes of the lower thoracic and lower lumbar spine. No osteoblastic or osteolytic lesions.  IMPRESSION: 1. Right pelvic abscess is decompressed by the pigtail catheter inserted on 04/10/2014. No evidence of a recurrent or new abscess. There are inflammatory type changes surrounding the pigtail catheter adjacent to the cecal tip and right adnexa. No normal appendix is seen. As noted previously, the abscess may have been from diverticulitis. An ruptured appendix is also possible since a normal appendix is not visualized. 2. No new acute abnormalities.  No evidence of bowel obstruction.   Electronically Signed   By: Lajean Manes M.D.   On: 04/16/2014 09:06   Dg Sinus/fist Tube Chk-non Gi  04/17/2014   CLINICAL DATA:  Diverticular abscess.  Abscess drainage tube check.  EXAM: ABSCESS INJECTION  TECHNIQUE: Under fluoroscopic  guidance nonionic contrast was administered into the right lower quadrant drainage tube. Spot images obtained.  CONTRAST:  30mL OMNIPAQUE IOHEXOL 300 MG/ML  SOLN  FLUOROSCOPY TIME:  0 min 43 seconds  COMPARISON:  CT 04/16/2014.  FINDINGS: Abscess drainage catheter in good anatomic position. Abscess cavity has diminished in size from prior CT of 04/16/2014 . Fistulization to the cecum is noted. Abscess drainage catheter left in place.  IMPRESSION: 1. Abscess drainage catheter in good anatomic position. Abscess cavity has diminished in size. 2. 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 Abscess drainage catheter left in place.   Electronically Signed   By: Marcello Moores  Register   On: 04/17/2014 12:07    Labs:  CBC:  Recent Labs  04/11/14 0515 04/12/14 0453 04/14/14 0605 04/17/14 0955  WBC 10.0 6.1 5.9 5.6  HGB 10.5* 9.9* 10.0* 11.9*  HCT 32.4* 31.2* 30.9* 36.3  PLT 339 358 383 340    COAGS:  Recent Labs  04/10/14 1215  INR 1.23    BMP:  Recent Labs  04/11/14 0515 04/12/14 0453 04/16/14 04/17/14 0955  NA 139 143 139 139  K 3.4* 3.7 3.3* 3.7  CL 103 106 99 96  CO2 25 28 30 30   GLUCOSE 106* 104* 150* 145*  BUN 5* 4* 7 10  CALCIUM 8.4 8.3* 9.1 9.3  CREATININE 0.82 0.85  0.95 1.07  GFRNONAA 68* 65* 57* 49*  GFRAA 79* 76* 66* 57*    LIVER FUNCTION TESTS:  Recent Labs  04/07/14 1535 04/09/14 1740 04/09/14 2046 04/10/14 0530  BILITOT 0.6 0.8 0.6 0.7  AST 30 25 26 23   ALT 34 23 24 21   ALKPHOS 244* 189* 184* 170*  PROT 8.0 6.7 7.0 6.8  ALBUMIN 2.5* 2.8* 2.2* 2.2*    Assessment and Plan: S/p RLQ abscess drainage 10/21; 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      I spent a total of 15 minutes face to face in clinical consultation/evaluation, greater than 50% of which was counseling/coordinating care for RLQ abscess drain.  Signed: Autumn Messing 04/17/2014, 3:29 PM

## 2014-04-17 NOTE — Progress Notes (Signed)
Agree.  Will switch to gravity bag drainage due to fistula and evaluate pt in [facility] at [facility] next week.

## 2014-04-18 ENCOUNTER — Other Ambulatory Visit (HOSPITAL_COMMUNITY): Payer: Self-pay | Admitting: Diagnostic Radiology

## 2014-04-18 DIAGNOSIS — L0291 Cutaneous abscess, unspecified: Secondary | ICD-10-CM

## 2014-04-22 ENCOUNTER — Ambulatory Visit (INDEPENDENT_AMBULATORY_CARE_PROVIDER_SITE_OTHER): Payer: Medicare HMO | Admitting: Internal Medicine

## 2014-04-22 ENCOUNTER — Encounter: Payer: Self-pay | Admitting: Internal Medicine

## 2014-04-22 VITALS — BP 132/74 | HR 59 | Ht 63.0 in | Wt 192.0 lb

## 2014-04-22 DIAGNOSIS — I480 Paroxysmal atrial fibrillation: Secondary | ICD-10-CM | POA: Insufficient documentation

## 2014-04-22 DIAGNOSIS — E782 Mixed hyperlipidemia: Secondary | ICD-10-CM

## 2014-04-22 DIAGNOSIS — I4891 Unspecified atrial fibrillation: Secondary | ICD-10-CM | POA: Insufficient documentation

## 2014-04-22 MED ORDER — WARFARIN SODIUM 4 MG PO TABS: ORAL_TABLET | ORAL | Status: DC

## 2014-04-22 NOTE — Progress Notes (Signed)
HPI Catherine Joseph is referred today by Catherine Joseph for evaluation of atrial arrhythmias. She is a very pleasant 73 year old woman from [place], who has a history of palpitations. The patient was hospitalized recently with abdominal pain, and found to have diverticulitis, and had a drain inserted. She subsequently developed palpitations. Cardiac monitoring has demonstrated sinus rhythm with PVCs, nonsustained atrial tachycardia, and paroxysmal atrial fibrillation. Her fibrillation at times is fairly coarse, but I do not think she has atrial flutter. She has been placed on low-dose flecainide, 50 mg twice daily. Her symptoms are much improved. The patient describes a history of loud snoring when she sleeps. She is not sure if she stops breathing at night. She has never had a sleep study. She has no additional or excessive bleeding risk. She has multiple stroke risk factors including hypertension, diabetes, age, and female. Allergies  Allergen Reactions  . Codeine Other (See Comments)    vertigo     Current Outpatient Prescriptions  Medication Sig Dispense Refill  . amoxicillin-clavulanate (AUGMENTIN) 875-125 MG per tablet Take 1 tablet by mouth every 12 (twelve) hours. 28 tablet 0  . feeding supplement, ENSURE COMPLETE, (ENSURE COMPLETE) LIQD Take 237 mLs by mouth daily at 8 pm. 10 Bottle 0  . flecainide (TAMBOCOR) 50 MG tablet Take 1 tablet (50 mg total) by mouth 2 (two) times daily. 60 tablet 11  . glipiZIDE (GLUCOTROL) 5 MG tablet Take 5 mg by mouth daily before breakfast.    . ibuprofen (ADVIL,MOTRIN) 200 MG tablet Take 400 mg by mouth every 6 (six) hours as needed for moderate pain.    . metroNIDAZOLE (FLAGYL) 500 MG tablet Take 1 tablet (500 mg total) by mouth every 8 (eight) hours. 42 tablet 0  . saccharomyces boulardii (FLORASTOR) 250 MG capsule Take 1 capsule (250 mg total) by mouth 2 (two) times daily. 60 capsule 0  . simvastatin (ZOCOR) 20 MG tablet Take 20 mg by mouth daily.    .  valsartan-hydrochlorothiazide (DIOVAN-HCT) 160-25 MG per tablet Take 1 tablet by mouth daily.    . ondansetron (ZOFRAN) 4 MG tablet Take 1 tablet (4 mg total) by mouth every 8 (eight) hours as needed for nausea or vomiting. 10 tablet 0  . traMADol (ULTRAM) 50 MG tablet Take 1 tablet (50 mg total) by mouth every 6 (six) hours as needed. (Patient taking differently: Take 50 mg by mouth every 6 (six) hours as needed (pain). ) 15 tablet 0   No current facility-administered medications for this visit.     Past Medical History  Diagnosis Date  . Hypertension   . Diabetes mellitus type II   . Syncope   . Bradycardia   . Weakness   . Cataract   . Osteoporosis     ROS:   All systems reviewed and negative except as noted in the HPI.   Past Surgical History  Procedure Laterality Date  . Eye surgery    . Cesarean section    . Abdominal hysterectomy       Family History  Problem Relation Age of Onset  . Diabetes Sister      History   Social History  . Marital Status: Widowed    Spouse Name: N/A    Number of Children: N/A  . Years of Education: N/A   Occupational History  . Not on file.   Social History Main Topics  . Smoking status: Never Smoker   . Smokeless tobacco: Not on file  . Alcohol  Use: No  . Drug Use: No  . Sexual Activity: Not on file   Other Topics Concern  . Not on file   Social History Narrative     BP 132/74 mmHg  Pulse 59  Ht 5\' 3"  (1.6 m)  Wt 192 lb (87.091 kg)  BMI 34.02 kg/m2  Physical Exam:  Well appearing 73 year old woman,NAD HEENT: Unremarkable Neck:  No JVD, no thyromegally Back:  No CVA tenderness Lungs:  Clear with no wheezes, rales, or rhonchi. HEART:  Regular rate rhythm, no murmurs, no rubs, no clicks Abd:  soft, positive bowel sounds, no organomegally, no rebound, no guarding Ext:  2 plus pulses, no edema, no cyanosis, no clubbing Skin:  No rashes no nodules Neuro:  CN II through XII intact, motor grossly intact  EKG -  normal sinus rhythm with normal axis and intervals.   Assess/Plan:

## 2014-04-22 NOTE — Assessment & Plan Note (Signed)
She will continue her current medication with statin therapy.

## 2014-04-22 NOTE — Patient Instructions (Signed)
Your physician recommends that you schedule a follow-up appointment in: 3 months with Dr Lovena Le  Your physician has recommended you make the following change in your medication:  1) Start Coumadin 4mg  daily  You have been referred to Coumadin Clinic--Fri

## 2014-04-22 NOTE — Assessment & Plan Note (Signed)
I have reviewed the patient's cardiac monitor which demonstrates brief episodes of atrial fibrillation. She also has PVCs. She hasn't been empirically placed on low-dose flecainide and had improvement in her symptoms. She is fairly sedentary, and at this time I would not be inclined to have the patient perform an exercise test. In addition, I recommended that the patient start taking anticoagulation. We will start with warfarin, but considered changing to a normal agent in the future, under the direction of Dr. Johnsie Cancel.

## 2014-04-25 ENCOUNTER — Telehealth: Payer: Self-pay

## 2014-04-25 NOTE — Telephone Encounter (Signed)
Dr.Copland, Pt states that the hospital prescribed a pro-biotic (FLORASTOR), she would like a refill on this if possible.  Best# [phone]  [facility] on [facility]

## 2014-04-26 ENCOUNTER — Ambulatory Visit (INDEPENDENT_AMBULATORY_CARE_PROVIDER_SITE_OTHER): Payer: Medicare HMO | Admitting: *Deleted

## 2014-04-26 DIAGNOSIS — I480 Paroxysmal atrial fibrillation: Secondary | ICD-10-CM

## 2014-04-26 NOTE — Progress Notes (Signed)
I cannot close this one.

## 2014-04-29 ENCOUNTER — Other Ambulatory Visit: Payer: Self-pay | Admitting: Radiology

## 2014-04-29 MED ORDER — SACCHAROMYCES BOULARDII 250 MG PO CAPS: 250.0000 mg | ORAL_CAPSULE | Freq: Two times a day (BID) | ORAL | Status: DC

## 2014-04-29 NOTE — Telephone Encounter (Signed)
Meds ordered this encounter  Medications  . saccharomyces boulardii (FLORASTOR) 250 MG capsule    Sig: Take 1 capsule (250 mg total) by mouth 2 (two) times daily.    Dispense:  60 capsule    Refill:  5

## 2014-04-29 NOTE — Progress Notes (Signed)
I cannot close this. Please help

## 2014-04-29 NOTE — Telephone Encounter (Signed)
Faxed and notified pt

## 2014-04-30 ENCOUNTER — Ambulatory Visit (INDEPENDENT_AMBULATORY_CARE_PROVIDER_SITE_OTHER): Payer: Medicare HMO | Admitting: *Deleted

## 2014-04-30 ENCOUNTER — Other Ambulatory Visit (HOSPITAL_COMMUNITY): Payer: Self-pay | Admitting: Interventional Radiology

## 2014-04-30 ENCOUNTER — Ambulatory Visit (HOSPITAL_COMMUNITY)
Admission: RE | Admit: 2014-04-30 | Discharge: 2014-04-30 | Disposition: A | Discharge status: Home / Self Care | Payer: Medicare HMO | Source: Ambulatory Visit | Attending: Diagnostic Radiology | Admitting: Diagnostic Radiology

## 2014-04-30 DIAGNOSIS — K5790 Diverticulosis of intestine, part unspecified, without perforation or abscess without bleeding: Secondary | ICD-10-CM | POA: Diagnosis not present

## 2014-04-30 DIAGNOSIS — L0291 Cutaneous abscess, unspecified: Secondary | ICD-10-CM

## 2014-04-30 DIAGNOSIS — L988 Other specified disorders of the skin and subcutaneous tissue: Secondary | ICD-10-CM

## 2014-04-30 DIAGNOSIS — K5730 Diverticulosis of large intestine without perforation or abscess without bleeding: Secondary | ICD-10-CM

## 2014-04-30 DIAGNOSIS — K630 Abscess of intestine: Secondary | ICD-10-CM | POA: Diagnosis not present

## 2014-04-30 DIAGNOSIS — I480 Paroxysmal atrial fibrillation: Secondary | ICD-10-CM

## 2014-04-30 DIAGNOSIS — N739 Female pelvic inflammatory disease, unspecified: Secondary | ICD-10-CM

## 2014-04-30 LAB — POCT INR: INR: 1.2

## 2014-04-30 MED ORDER — IOHEXOL 300 MG/ML  SOLN
50.0000 mL | Freq: Once | INTRAMUSCULAR | Status: AC | PRN
  Administered 2014-04-30: 10 mL via INTRAVENOUS

## 2014-05-01 ENCOUNTER — Ambulatory Visit (INDEPENDENT_AMBULATORY_CARE_PROVIDER_SITE_OTHER): Payer: Medicare HMO | Admitting: Family Medicine

## 2014-05-01 VITALS — BP 132/74 | HR 65 | Temp 98.4°F | Resp 18 | Ht 63.0 in | Wt 189.0 lb

## 2014-05-01 DIAGNOSIS — R1031 Right lower quadrant pain: Secondary | ICD-10-CM

## 2014-05-01 DIAGNOSIS — R312 Other microscopic hematuria: Secondary | ICD-10-CM

## 2014-05-01 DIAGNOSIS — R3129 Other microscopic hematuria: Secondary | ICD-10-CM

## 2014-05-01 LAB — POCT UA - MICROSCOPIC ONLY
Bacteria, U Microscopic: NEGATIVE
Casts, Ur, LPF, POC: NEGATIVE
Crystals, Ur, HPF, POC: NEGATIVE
MUCUS UA: NEGATIVE
YEAST UA: NEGATIVE

## 2014-05-01 LAB — POCT URINALYSIS DIPSTICK
Bilirubin, UA: NEGATIVE
Glucose, UA: NEGATIVE
KETONES UA: NEGATIVE
Nitrite, UA: NEGATIVE
Protein, UA: NEGATIVE
Spec Grav, UA: 1.02
UROBILINOGEN UA: 0.2
pH, UA: 6

## 2014-05-01 NOTE — Patient Instructions (Signed)
We are going to set you up to see a urologist to check out the small amount of blood in your urine. Take care and let me know if you need anything in the meantime.  I am so glad that you are better!

## 2014-05-01 NOTE — Progress Notes (Signed)
[facility] [address] [place] [address] [phone]  Date:  05/01/2014   Name:  Catherine Joseph   DOB:  [birth year]   MRN:  [id number]  PCP:  Kennon Portela, MD    Chief Complaint: Follow-up   History of Present Illness:  Catherine Joseph is a 73 y.o. very pleasant female patient who presents with the following:  Here today to follow-up a recent abnormal UA.  She was here on 10/20 with fever, tachycardia and abd pain- turned out to have sepsis, abdominal abscess, intermittent atrial fib.  She was admitted 10/20- 04/17/14.  I had asked her to come in as she had some micorhematuria on her original UA- 2-8 cells per field, 3-6 in the hospital.  Urine culture from 10/20 was negative  She still has a drain in place from her RLQ.  Over all she is feeling much better.  No longer having a fever, and she is able to eat again.  She is eager to have her drain removed but it is not yet time according to her surgeon She is on coumadin, flecainide, diovan hct, augmentin, flagyl  Patient Active Problem List   Diagnosis Date Noted  . Atrial fibrillation 04/22/2014  . Anemia of chronic disease 04/10/2014  . Sepsis 04/09/2014  . Intra-abdominal abscess 04/09/2014  . Chest pain 02/11/2014  . 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 Lung nodule 02/01/2012  . Mixed hyperlipidemia 08/03/2011  . Bradycardia 08/03/2011  . Dizzy spells 08/03/2011  . Hypertension 07/22/2011  . Diabetes mellitus, type II 07/22/2011    Past Medical History  Diagnosis Date  . Hypertension   . Diabetes mellitus type II   . Syncope   . Bradycardia   . Weakness   . Cataract   . Osteoporosis     Past Surgical History  Procedure Laterality Date  . Eye surgery    . Cesarean section    . Abdominal hysterectomy      History  Substance Use Topics  . Smoking status: Never Smoker   . Smokeless tobacco: Not on file  . Alcohol Use: No    Family History  Problem Relation Age  of Onset  . Diabetes Sister     Allergies  Allergen Reactions  . Codeine Other (See Comments)    vertigo    Medication list has been reviewed and updated.  Current Outpatient Prescriptions on File Prior to Visit  Medication Sig Dispense Refill  . amoxicillin-clavulanate (AUGMENTIN) 875-125 MG per tablet Take 1 tablet by mouth every 12 (twelve) hours. 28 tablet 0  . feeding supplement, ENSURE COMPLETE, (ENSURE COMPLETE) LIQD Take 237 mLs by mouth daily at 8 pm. 10 Bottle 0  . flecainide (TAMBOCOR) 50 MG tablet Take 1 tablet (50 mg total) by mouth 2 (two) times daily. 60 tablet 11  . glipiZIDE (GLUCOTROL) 5 MG tablet Take 5 mg by mouth daily before breakfast.    . metroNIDAZOLE (FLAGYL) 500 MG tablet Take 1 tablet (500 mg total) by mouth every 8 (eight) hours. 42 tablet 0  . saccharomyces boulardii (FLORASTOR) 250 MG capsule Take 1 capsule (250 mg total) by mouth 2 (two) times daily. 60 capsule 5  . simvastatin (ZOCOR) 20 MG tablet Take 20 mg by mouth daily.    . valsartan-hydrochlorothiazide (DIOVAN-HCT) 160-25 MG per tablet Take 1 tablet by mouth daily.    Marland Kitchen warfarin (COUMADIN) 4 MG tablet Take nightly as directed by coumadin clinic 45 tablet 3  .  ibuprofen (ADVIL,MOTRIN) 200 MG tablet Take 400 mg by mouth every 6 (six) hours as needed for moderate pain.    Marland Kitchen ondansetron (ZOFRAN) 4 MG tablet Take 1 tablet (4 mg total) by mouth every 8 (eight) hours as needed for nausea or vomiting. 10 tablet 0  . traMADol (ULTRAM) 50 MG tablet Take 1 tablet (50 mg total) by mouth every 6 (six) hours as needed. (Patient taking differently: Take 50 mg by mouth every 6 (six) hours as needed (pain). ) 15 tablet 0   No current facility-administered medications on file prior to visit.    Review of Systems:  As per HPI- otherwise negative.   Physical Examination: Filed Vitals:   05/01/14 1410  BP: 132/74  Pulse: 65  Temp: 98.4 F (36.9 C)  Resp: 18   Filed Vitals:   05/01/14 1410  Height: 5\' 3"   (1.6 m)  Weight: 189 lb (85.73 kg)   Body mass index is 33.49 kg/(m^2). Ideal Body Weight: Weight in (lb) to have BMI = 25: 140.8  GEN: WDWN, NAD, Non-toxic, A & O x 3, obese, looks much better than when I saw her last time.   HEENT: Atraumatic, Normocephalic. Neck supple. No masses, No LAD. Ears and Nose: No external deformity. CV: RRR, No M/G/R. No JVD. No thrill. No extra heart sounds. PULM: CTA B, no wheezes, crackles, rhonchi. No retractions. No resp. distress. No accessory muscle use. ABD: S, NT EXTR: No c/c/e NEURO Normal gait.  PSYCH: Normally interactive. Conversant. Not depressed or anxious appearing.  Calm demeanor.  Drain in place RLQ   Results for orders placed or performed in visit on 05/01/14  POCT urinalysis dipstick  Result Value Ref Range   Color, UA amber    Clarity, UA clear    Glucose, UA neg    Bilirubin, UA neg    Ketones, UA neg    Spec Grav, UA 1.020    Blood, UA small    pH, UA 6.0    Protein, UA neg    Urobilinogen, UA 0.2    Nitrite, UA neg    Leukocytes, UA Trace   POCT UA - Microscopic Only  Result Value Ref Range   WBC, Ur, HPF, POC 0-2    RBC, urine, microscopic 1-3    Bacteria, U Microscopic neg    Mucus, UA neg    Epithelial cells, urine per micros 0-1    Crystals, Ur, HPF, POC neg    Casts, Ur, LPF, POC neg    Yeast, UA neg     Assessment and Plan: Microhematuria - Plan: Ambulatory referral to Urology  Right lower quadrant abdominal pain - Plan: POCT urinalysis dipstick, POCT UA - Microscopic Only  Persistent microhematuria.  Will refer her to urology for evaluation-may still need a cystoscopy   Signed Lamar Blinks, MD

## 2014-05-07 ENCOUNTER — Ambulatory Visit (INDEPENDENT_AMBULATORY_CARE_PROVIDER_SITE_OTHER): Payer: Medicare HMO | Admitting: Pharmacist Clinician (PhC)/ Clinical Pharmacy Specialist

## 2014-05-07 DIAGNOSIS — I480 Paroxysmal atrial fibrillation: Secondary | ICD-10-CM

## 2014-05-07 LAB — POCT INR: INR: 2.1

## 2014-05-14 ENCOUNTER — Ambulatory Visit (HOSPITAL_COMMUNITY)
Admission: RE | Admit: 2014-05-14 | Discharge: 2014-05-14 | Disposition: A | Discharge status: Home / Self Care | Payer: Medicare HMO | Source: Ambulatory Visit | Attending: Interventional Radiology | Admitting: Interventional Radiology

## 2014-05-14 ENCOUNTER — Ambulatory Visit (INDEPENDENT_AMBULATORY_CARE_PROVIDER_SITE_OTHER): Payer: Medicare HMO | Admitting: *Deleted

## 2014-05-14 ENCOUNTER — Other Ambulatory Visit (HOSPITAL_COMMUNITY): Payer: Self-pay | Admitting: Interventional Radiology

## 2014-05-14 DIAGNOSIS — N739 Female pelvic inflammatory disease, unspecified: Secondary | ICD-10-CM | POA: Insufficient documentation

## 2014-05-14 DIAGNOSIS — I480 Paroxysmal atrial fibrillation: Secondary | ICD-10-CM

## 2014-05-14 LAB — POCT INR: INR: 2.8

## 2014-05-14 MED ORDER — IOHEXOL 300 MG/ML  SOLN
50.0000 mL | Freq: Once | INTRAMUSCULAR | Status: AC | PRN
  Administered 2014-05-14: 15 mL

## 2014-05-22 ENCOUNTER — Telehealth: Payer: Self-pay | Admitting: Cardiovascular Disease

## 2014-05-22 ENCOUNTER — Ambulatory Visit (INDEPENDENT_AMBULATORY_CARE_PROVIDER_SITE_OTHER): Payer: Medicare HMO | Admitting: *Deleted

## 2014-05-22 ENCOUNTER — Ambulatory Visit (INDEPENDENT_AMBULATORY_CARE_PROVIDER_SITE_OTHER): Payer: Medicare HMO | Admitting: Cardiovascular Disease

## 2014-05-22 VITALS — BP 128/62 | HR 72 | Ht 63.0 in | Wt 184.8 lb

## 2014-05-22 DIAGNOSIS — I10 Essential (primary) hypertension: Secondary | ICD-10-CM

## 2014-05-22 DIAGNOSIS — I480 Paroxysmal atrial fibrillation: Secondary | ICD-10-CM

## 2014-05-22 DIAGNOSIS — E782 Mixed hyperlipidemia: Secondary | ICD-10-CM

## 2014-05-22 LAB — POCT INR: INR: 2.1

## 2014-05-22 NOTE — Assessment & Plan Note (Signed)
Cholesterol is at goal.  Continue current dose of statin and diet Rx.  No myalgias or side effects.  F/U  LFT's in 6 months. Lab Results  Component Value Date   LDLCALC 78 05/25/2013

## 2014-05-22 NOTE — Assessment & Plan Note (Signed)
Well controlled.  Continue current medications and low sodium Dash type diet.    

## 2014-05-22 NOTE — Telephone Encounter (Signed)
CALLED  PHARMACY   TO GET  PRICE  ON  XARELTO   20 MG    PER  PHARMACISTS  WILL COST   PT   $ 45.00 PER  MONTH .PER  PT  WILL  REMAIN  ON WARFARIN .Catherine Joseph

## 2014-05-22 NOTE — Assessment & Plan Note (Signed)
Maint NSR on flecainide  Continue coumadin  Samples of xarelto given and if affordable will switch.  INR today Rx 2.1

## 2014-05-22 NOTE — Telephone Encounter (Signed)
New problem    Pt need prescription for Xarelto. Send to [facility].

## 2014-05-22 NOTE — Patient Instructions (Signed)
Your physician wants you to follow-up in:   [place] will receive a reminder letter in the mail two months in advance. If you don't receive a letter, please call our office to schedule the follow-up appointment. Your physician has recommended you make the following change in your medication:  MAY STOP  WARFARIN  AND  START  XARELTO  20 MG   PT  TO CHECK ON   PRICE .Adonis Housekeeper

## 2014-05-22 NOTE — Progress Notes (Signed)
Patient ID: Catherine Joseph, female   DOB: 1940-10-05, 73 y.o.   MRN: [id number] 73 yo [place] female initially referred by Dr Verline Lema 2/13 for bradycardia and dizzyness. Basically weak and "whoozy" . No SSCP. No previous w/u for structural heart disease. No history of seizures or neurologic issues. CRF;s DM/HTN and elevated lipiids. No history of hypoglycemia. Labs reviewed from 1/13 and TSH normal not anemic and HbA1c 7.1   Normal stress echo 08/27/11  Reviewed event monitor SR PAC/PVC no significant arrhythmia  Referred back by Dr Edilia Bo for atypical chest pain Last few weeks has had "tingling" in chest Not exertional lasts minutes More at night Can go to gym and Zumba class without difficulty.   Event monitor showed atrial flutter 9/15  Started on flecainide  Seen by GT and started on coumadin     ROS: Denies fever, malais, weight loss, blurry vision, decreased visual acuity, cough, sputum, SOB, hemoptysis, pleuritic pain, palpitaitons, heartburn, abdominal pain, melena, lower extremity edema, claudication, or rash.  All other systems reviewed and negative  General: Affect appropriate Healthy:  appears stated age 74: normal Neck supple with no adenopathy JVP normal no bruits no thyromegaly Lungs clear with no wheezing and good diaphragmatic motion Heart:  S1/S2 no murmur, no rub, gallop or click PMI normal Abdomen: benighn, BS positve, no tenderness, no AAA no bruit.  No HSM or HJR Distal pulses intact with no bruits No edema Neuro non-focal Skin warm and dry No muscular weakness   Current Outpatient Prescriptions  Medication Sig Dispense Refill  . flecainide (TAMBOCOR) 50 MG tablet Take 1 tablet (50 mg total) by mouth 2 (two) times daily. 60 tablet 11  . glipiZIDE (GLUCOTROL) 5 MG tablet Take 5 mg by mouth daily before breakfast.    . simvastatin (ZOCOR) 20 MG tablet Take 20 mg by mouth daily.    . traMADol (ULTRAM) 50 MG tablet Take 1 tablet (50 mg total) by  mouth every 6 (six) hours as needed. (Patient taking differently: Take 50 mg by mouth every 6 (six) hours as needed (pain). ) 15 tablet 0  . valsartan-hydrochlorothiazide (DIOVAN-HCT) 160-25 MG per tablet Take 1 tablet by mouth daily.    Marland Kitchen warfarin (COUMADIN) 4 MG tablet Take nightly as directed by coumadin clinic 45 tablet 3   No current facility-administered medications for this visit.    Allergies  Codeine  Electrocardiogram:  SR rate 59 normal   Assessment and Plan

## 2014-06-05 ENCOUNTER — Ambulatory Visit (INDEPENDENT_AMBULATORY_CARE_PROVIDER_SITE_OTHER): Payer: Medicare HMO | Admitting: Pharmacist

## 2014-06-05 DIAGNOSIS — I480 Paroxysmal atrial fibrillation: Secondary | ICD-10-CM

## 2014-06-05 LAB — POCT INR: INR: 1.4

## 2014-06-06 ENCOUNTER — Other Ambulatory Visit (INDEPENDENT_AMBULATORY_CARE_PROVIDER_SITE_OTHER): Payer: Self-pay

## 2014-06-06 DIAGNOSIS — N739 Female pelvic inflammatory disease, unspecified: Secondary | ICD-10-CM

## 2014-06-11 ENCOUNTER — Ambulatory Visit (HOSPITAL_COMMUNITY)
Admission: RE | Admit: 2014-06-11 | Discharge: 2014-06-11 | Disposition: A | Discharge status: Home / Self Care | Payer: Medicare HMO | Source: Ambulatory Visit | Attending: Interventional Radiology | Admitting: Interventional Radiology

## 2014-06-11 ENCOUNTER — Other Ambulatory Visit (INDEPENDENT_AMBULATORY_CARE_PROVIDER_SITE_OTHER): Payer: Self-pay

## 2014-06-11 ENCOUNTER — Other Ambulatory Visit (HOSPITAL_COMMUNITY): Payer: Self-pay | Admitting: Interventional Radiology

## 2014-06-11 ENCOUNTER — Ambulatory Visit (HOSPITAL_COMMUNITY)
Admission: RE | Admit: 2014-06-11 | Discharge: 2014-06-11 | Disposition: A | Discharge status: Home / Self Care | Payer: Medicare HMO | Source: Ambulatory Visit | Attending: General Surgery | Admitting: General Surgery

## 2014-06-11 ENCOUNTER — Ambulatory Visit (INDEPENDENT_AMBULATORY_CARE_PROVIDER_SITE_OTHER): Payer: Medicare HMO | Admitting: Pharmacist

## 2014-06-11 DIAGNOSIS — N739 Female pelvic inflammatory disease, unspecified: Secondary | ICD-10-CM

## 2014-06-11 DIAGNOSIS — I771 Stricture of artery: Secondary | ICD-10-CM | POA: Insufficient documentation

## 2014-06-11 DIAGNOSIS — K5780 Diverticulitis of intestine, part unspecified, with perforation and abscess without bleeding: Secondary | ICD-10-CM | POA: Insufficient documentation

## 2014-06-11 DIAGNOSIS — I700 Atherosclerosis of aorta: Secondary | ICD-10-CM | POA: Insufficient documentation

## 2014-06-11 DIAGNOSIS — Z9689 Presence of other specified functional implants: Secondary | ICD-10-CM | POA: Diagnosis not present

## 2014-06-11 DIAGNOSIS — I480 Paroxysmal atrial fibrillation: Secondary | ICD-10-CM

## 2014-06-11 DIAGNOSIS — Z90710 Acquired absence of both cervix and uterus: Secondary | ICD-10-CM | POA: Insufficient documentation

## 2014-06-11 DIAGNOSIS — K5730 Diverticulosis of large intestine without perforation or abscess without bleeding: Secondary | ICD-10-CM | POA: Diagnosis not present

## 2014-06-11 DIAGNOSIS — K632 Fistula of intestine: Secondary | ICD-10-CM | POA: Diagnosis not present

## 2014-06-11 DIAGNOSIS — M2578 Osteophyte, vertebrae: Secondary | ICD-10-CM | POA: Diagnosis not present

## 2014-06-11 LAB — POCT INR: INR: 1.5

## 2014-06-11 LAB — POCT I-STAT CREATININE: CREATININE: 0.9 mg/dL (ref 0.50–1.10)

## 2014-06-11 MED ORDER — IOHEXOL 300 MG/ML  SOLN
50.0000 mL | Freq: Once | INTRAMUSCULAR | Status: AC | PRN
  Administered 2014-06-11: 5 mL via INTRAVENOUS

## 2014-06-11 MED ORDER — IOHEXOL 300 MG/ML  SOLN
100.0000 mL | Freq: Once | INTRAMUSCULAR | Status: AC | PRN
Start: 2014-06-11 — End: 2014-06-11
  Administered 2014-06-11: 100 mL via INTRAVENOUS

## 2014-06-18 ENCOUNTER — Ambulatory Visit (INDEPENDENT_AMBULATORY_CARE_PROVIDER_SITE_OTHER): Payer: Medicare HMO | Admitting: *Deleted

## 2014-06-18 DIAGNOSIS — I480 Paroxysmal atrial fibrillation: Secondary | ICD-10-CM

## 2014-06-18 LAB — POCT INR: INR: 1.6

## 2014-06-25 ENCOUNTER — Ambulatory Visit (INDEPENDENT_AMBULATORY_CARE_PROVIDER_SITE_OTHER): Payer: Medicare HMO | Admitting: Pharmacist

## 2014-06-25 DIAGNOSIS — I480 Paroxysmal atrial fibrillation: Secondary | ICD-10-CM

## 2014-06-25 LAB — POCT INR: INR: 1.9

## 2014-07-09 ENCOUNTER — Ambulatory Visit (INDEPENDENT_AMBULATORY_CARE_PROVIDER_SITE_OTHER): Payer: Medicare HMO | Admitting: *Deleted

## 2014-07-09 DIAGNOSIS — I480 Paroxysmal atrial fibrillation: Secondary | ICD-10-CM

## 2014-07-09 LAB — POCT INR: INR: 2.6

## 2014-07-18 ENCOUNTER — Other Ambulatory Visit: Payer: Self-pay | Admitting: Family Medicine

## 2014-07-23 ENCOUNTER — Ambulatory Visit (INDEPENDENT_AMBULATORY_CARE_PROVIDER_SITE_OTHER): Payer: Medicare HMO | Admitting: *Deleted

## 2014-07-23 ENCOUNTER — Ambulatory Visit (INDEPENDENT_AMBULATORY_CARE_PROVIDER_SITE_OTHER): Payer: Medicare HMO | Admitting: Internal Medicine

## 2014-07-23 ENCOUNTER — Encounter: Payer: Self-pay | Admitting: Internal Medicine

## 2014-07-23 VITALS — BP 124/78 | HR 56 | Ht 63.0 in | Wt 189.0 lb

## 2014-07-23 DIAGNOSIS — R079 Chest pain, unspecified: Secondary | ICD-10-CM

## 2014-07-23 DIAGNOSIS — I10 Essential (primary) hypertension: Secondary | ICD-10-CM

## 2014-07-23 DIAGNOSIS — I480 Paroxysmal atrial fibrillation: Secondary | ICD-10-CM

## 2014-07-23 LAB — POCT INR: INR: 2.5

## 2014-07-23 NOTE — Assessment & Plan Note (Signed)
She has had no recurrent symptoms. No change in meds. She is encouraged to increase her physical activity.

## 2014-07-23 NOTE — Progress Notes (Signed)
      HPI Catherine Joseph returns today for ongoing evaluation of her atrial arrhythmias. She is a very pleasant 74 year old woman from [place], who has a history of palpitations. She has atrial fib and atrial tachycardia and she has been placed on low-dose flecainide, 50 mg twice daily. Her symptoms were much improved. She was started on Warfarin and is tolerating well. She stopped taking her flecainide because the prescription was too expensive, $47/month.  Allergies  Allergen Reactions  . Codeine Other (See Comments)    vertigo     Current Outpatient Prescriptions  Medication Sig Dispense Refill  . glipiZIDE (GLUCOTROL) 5 MG tablet Take 5 mg by mouth daily before breakfast.    . simvastatin (ZOCOR) 20 MG tablet Take 20 mg by mouth daily.    . valsartan-hydrochlorothiazide (DIOVAN-HCT) 160-25 MG per tablet Take 1 tablet by mouth daily.    Marland Kitchen warfarin (COUMADIN) 4 MG tablet Take nightly as directed by coumadin clinic 45 tablet 3   No current facility-administered medications for this visit.     Past Medical History  Diagnosis Date  . Hypertension   . Diabetes mellitus type II   . Syncope   . Bradycardia   . Weakness   . Cataract   . Osteoporosis     ROS:   All systems reviewed and negative except as noted in the HPI.   Past Surgical History  Procedure Laterality Date  . Eye surgery    . Cesarean section    . Abdominal hysterectomy       Family History  Problem Relation Age of Onset  . Diabetes Sister      History   Social History  . Marital Status: Widowed    Spouse Name: N/A    Number of Children: N/A  . Years of Education: N/A   Occupational History  . Not on file.   Social History Main Topics  . Smoking status: Never Smoker   . Smokeless tobacco: Not on file  . Alcohol Use: No  . Drug Use: No  . Sexual Activity: Not on file   Other Topics Concern  . Not on file   Social History Narrative     BP 124/78 mmHg  Pulse 56  Ht 5\' 3"  (1.6 m)  Wt  189 lb (85.73 kg)  BMI 33.49 kg/m2  Physical Exam:  Well appearing 74 year old woman,NAD HEENT: Unremarkable Neck:  No JVD, no thyromegally Back:  No CVA tenderness Lungs:  Clear with no wheezes, rales, or rhonchi. HEART:  Regular rate rhythm, no murmurs, no rubs, no clicks Abd:  soft, positive bowel sounds, no organomegally, no rebound, no guarding Ext:  2 plus pulses, no edema, no cyanosis, no clubbing Skin:  No rashes no nodules Neuro:  CN II through XII intact, motor grossly intact  EKG - normal sinus rhythm with normal axis and intervals.   Assess/Plan:

## 2014-07-23 NOTE — Patient Instructions (Signed)
Your physician recommends that you schedule a follow-up appointment as needed  

## 2014-07-23 NOTE — Assessment & Plan Note (Signed)
Her blood pressure is well controlled. No change in meds. She will reduce her salt intake.

## 2014-07-23 NOTE — Assessment & Plan Note (Signed)
She has stopped flecainide but is maintaining NSR. She will continue her current meds. Will hold off on flecainide for now.Continue warfarin.

## 2014-08-03 ENCOUNTER — Ambulatory Visit (INDEPENDENT_AMBULATORY_CARE_PROVIDER_SITE_OTHER): Payer: Medicare HMO | Admitting: Family Medicine

## 2014-08-03 VITALS — BP 130/80 | HR 51 | Temp 98.4°F | Resp 16 | Ht 64.5 in | Wt 190.0 lb

## 2014-08-03 DIAGNOSIS — R312 Other microscopic hematuria: Secondary | ICD-10-CM

## 2014-08-03 DIAGNOSIS — E118 Type 2 diabetes mellitus with unspecified complications: Secondary | ICD-10-CM

## 2014-08-03 DIAGNOSIS — N898 Other specified noninflammatory disorders of vagina: Secondary | ICD-10-CM

## 2014-08-03 DIAGNOSIS — I480 Paroxysmal atrial fibrillation: Secondary | ICD-10-CM

## 2014-08-03 DIAGNOSIS — R3129 Other microscopic hematuria: Secondary | ICD-10-CM

## 2014-08-03 DIAGNOSIS — L298 Other pruritus: Secondary | ICD-10-CM

## 2014-08-03 LAB — POCT URINALYSIS DIPSTICK
Bilirubin, UA: NEGATIVE
Glucose, UA: NEGATIVE
Nitrite, UA: NEGATIVE
PH UA: 6
PROTEIN UA: NEGATIVE
Spec Grav, UA: 1.02
Urobilinogen, UA: 0.2

## 2014-08-03 LAB — POCT UA - MICROSCOPIC ONLY
Bacteria, U Microscopic: NEGATIVE
CRYSTALS, UR, HPF, POC: NEGATIVE
Casts, Ur, LPF, POC: NEGATIVE
Mucus, UA: NEGATIVE
Yeast, UA: NEGATIVE

## 2014-08-03 LAB — POCT GLYCOSYLATED HEMOGLOBIN (HGB A1C): Hemoglobin A1C: 6.9

## 2014-08-03 MED ORDER — WARFARIN SODIUM 4 MG PO TABS: ORAL_TABLET | ORAL | Status: DC

## 2014-08-03 MED ORDER — TERCONAZOLE 0.4 % VA CREA: 1.0000 | TOPICAL_CREAM | Freq: Every day | VAGINAL | Status: DC

## 2014-08-03 NOTE — Patient Instructions (Addendum)
Use the terconazole vaginal cream for your itching- This will be safer for you than fluconazole Your diabetes control is ok- keep up the good work!  Let's check you in about 4 months You do continue to have a little blood in your urine.  Please see the urologist when you are able

## 2014-08-03 NOTE — Progress Notes (Signed)
[facility] [address] [place] [address] [phone]  Date:  08/03/2014   Name:  Catherine Joseph   DOB:  11-23-1940   MRN:  [id number]  PCP:  Kennon Portela, MD    Chief Complaint: Medication Refill   History of Present Illness:  Catherine Joseph is a 74 y.o. very pleasant female patient who presents with the following:  She is here today to follow-up.  She was here last October with sepsis and intraabdomianl abscess, a fib.  She had a drain from her abdomen for about 2 months but this is now removed She is also now on coumadin for paroxysmal atrial fibrillation.   She has her INR checked at the coumadin clinic every 3 weeks.  She would like for me to refill this medication for her today She is taking coumadin 4mg  as directed by her clinic.  She takes 4mg  2 days a week, 1.5 the other days.    Lab Results  Component Value Date   INR 2.5 07/23/2014   INR 2.6 07/09/2014   INR 1.9 06/25/2014   She also needs her DM test strips.  She tests once a day  Lab Results  Component Value Date   HGBA1C 7.6* 04/09/2014   She is due for an A1c test.  Also, she thinks that she may have a vaginal yeast infection and would like a fluconazole. She notes some itching, no discharge.   She was also noted to have unexplained microhematuria in the fall; I had referred her to urology.  She did not go yet due to financial reasons, would like to recheck her UA today  Patient Active Problem List   Diagnosis Date Noted  . Atrial fibrillation 04/22/2014  . Anemia of chronic disease 04/10/2014  . Sepsis 04/09/2014  . Intra-abdominal abscess 04/09/2014  . Chest pain 02/11/2014  . 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 Lung nodule 02/01/2012  . Mixed hyperlipidemia 08/03/2011  . Bradycardia 08/03/2011  . Dizzy spells 08/03/2011  . Hypertension 07/22/2011  . Diabetes mellitus, type II 07/22/2011    Past Medical History  Diagnosis Date  . Hypertension   .  Diabetes mellitus type II   . Syncope   . Bradycardia   . Weakness   . Cataract   . Osteoporosis     Past Surgical History  Procedure Laterality Date  . Eye surgery    . Cesarean section    . Abdominal hysterectomy      History  Substance Use Topics  . Smoking status: Never Smoker   . Smokeless tobacco: Not on file  . Alcohol Use: No    Family History  Problem Relation Age of Onset  . Diabetes Sister     Allergies  Allergen Reactions  . Codeine Other (See Comments)    vertigo    Medication list has been reviewed and updated.  Current Outpatient Prescriptions on File Prior to Visit  Medication Sig Dispense Refill  . glipiZIDE (GLUCOTROL) 5 MG tablet Take 5 mg by mouth daily before breakfast.    . simvastatin (ZOCOR) 20 MG tablet Take 20 mg by mouth daily.    . valsartan-hydrochlorothiazide (DIOVAN-HCT) 160-25 MG per tablet Take 1 tablet by mouth daily.    Marland Kitchen warfarin (COUMADIN) 4 MG tablet Take nightly as directed by coumadin clinic 45 tablet 3   No current facility-administered medications on file prior to visit.    Review of Systems:  As per HPI- otherwise negative.  Physical Examination: Filed Vitals:   08/03/14 1319  BP: 130/80  Pulse: 51  Temp: 98.4 F (36.9 C)  Resp: 16   Filed Vitals:   08/03/14 1319  Height: 5' 4.5" (1.638 m)  Weight: 190 lb (86.183 kg)   Body mass index is 32.12 kg/(m^2). Ideal Body Weight: Weight in (lb) to have BMI = 25: 147.6  GEN: WDWN, NAD, Non-toxic, A & O x 3, obese, looks well HEENT: Atraumatic, Normocephalic. Neck supple. No masses, No LAD. Ears and Nose: No external deformity. CV: RRR, No M/G/R. No JVD. No thrill. No extra heart sounds. PULM: CTA B, no wheezes, crackles, rhonchi. No retractions. No resp. distress. No accessory muscle use. ABD: S, NT, ND, +BS. No rebound. No HSM.  Her abdomen is well healed EXTR: No c/c/e NEURO Normal gait.  PSYCH: Normally interactive. Conversant. Not depressed or anxious  appearing.  Calm demeanor.   Results for orders placed or performed in visit on 08/03/14  POCT UA - Microscopic Only  Result Value Ref Range   WBC, Ur, HPF, POC 0-2    RBC, urine, microscopic 1-4    Bacteria, U Microscopic neg    Mucus, UA neg    Epithelial cells, urine per micros 0-2    Crystals, Ur, HPF, POC neg    Casts, Ur, LPF, POC neg    Yeast, UA neg   POCT glycosylated hemoglobin (Hb A1C)  Result Value Ref Range   Hemoglobin A1C 6.9   POCT urinalysis dipstick  Result Value Ref Range   Color, UA yellow    Clarity, UA clear    Glucose, UA neg    Bilirubin, UA neg    Ketones, UA trace    Spec Grav, UA 1.020    Blood, UA moderate    pH, UA 6.0    Protein, UA neg    Urobilinogen, UA 0.2    Nitrite, UA neg    Leukocytes, UA Trace    Assessment and Plan: Paroxysmal atrial fibrillation - Plan: warfarin (COUMADIN) 4 MG tablet, POCT urinalysis dipstick  Diabetes mellitus type 2 with complications - Plan: POCT glycosylated hemoglobin (Hb A1C), POCT urinalysis dipstick  Microhematuria - Plan: POCT UA - Microscopic Only, Microalbumin, urine, POCT urinalysis dipstick  Vaginal itching - Plan: terconazole (TERAZOL 7) 0.4 % vaginal cream  Refilled her coumadin.  Pt reports that she will stay on this "for life.'  She will continue to follow-up with her coumadin clinic as usual.   Counseled her that she still has microhematia.  She plans to go ahead with urology evaluation She reports sx of a vaginal yeast infection.  Will treat with terazole cream DM control is ok, continue current routine  Signed Lamar Blinks, MD

## 2014-08-05 LAB — MICROALBUMIN, URINE: Microalb, Ur: 0.8 mg/dL (ref <2.0)

## 2014-08-06 ENCOUNTER — Other Ambulatory Visit: Payer: Self-pay

## 2014-08-06 DIAGNOSIS — Z1231 Encounter for screening mammogram for malignant neoplasm of breast: Secondary | ICD-10-CM

## 2014-08-20 ENCOUNTER — Ambulatory Visit (INDEPENDENT_AMBULATORY_CARE_PROVIDER_SITE_OTHER): Payer: Medicare HMO | Admitting: Pharmacist

## 2014-08-20 DIAGNOSIS — I480 Paroxysmal atrial fibrillation: Secondary | ICD-10-CM

## 2014-08-20 LAB — POCT INR: INR: 2.3

## 2014-08-30 ENCOUNTER — Other Ambulatory Visit: Payer: Self-pay | Admitting: Family Medicine

## 2014-09-02 ENCOUNTER — Telehealth: Payer: Self-pay

## 2014-09-02 MED ORDER — UNABLE TO FIND: Status: DC

## 2014-09-02 NOTE — Telephone Encounter (Signed)
Dr Lorelei Pont, I pended a Rx for cuff for your review.

## 2014-09-02 NOTE — Telephone Encounter (Signed)
Did rx for her, called and let her know it is ready to pick up

## 2014-09-02 NOTE — Telephone Encounter (Signed)
Pt states needs a rx for new blood pressure cuff(She will pick up rx)   Best phone for pt is [phone]

## 2014-09-17 ENCOUNTER — Ambulatory Visit
Admission: RE | Admit: 2014-09-17 | Discharge: 2014-09-17 | Disposition: A | Discharge status: Home / Self Care | Payer: Medicare HMO | Source: Ambulatory Visit

## 2014-09-17 DIAGNOSIS — Z1231 Encounter for screening mammogram for malignant neoplasm of breast: Secondary | ICD-10-CM

## 2014-10-01 ENCOUNTER — Ambulatory Visit (INDEPENDENT_AMBULATORY_CARE_PROVIDER_SITE_OTHER): Payer: Medicare HMO | Admitting: *Deleted

## 2014-10-01 DIAGNOSIS — I480 Paroxysmal atrial fibrillation: Secondary | ICD-10-CM

## 2014-10-01 LAB — POCT INR: INR: 2.2

## 2014-10-22 ENCOUNTER — Other Ambulatory Visit: Payer: Self-pay | Admitting: Family Medicine

## 2014-11-11 ENCOUNTER — Encounter: Payer: Self-pay | Admitting: Family Medicine

## 2014-11-11 ENCOUNTER — Ambulatory Visit (INDEPENDENT_AMBULATORY_CARE_PROVIDER_SITE_OTHER): Payer: Medicare HMO | Admitting: Family Medicine

## 2014-11-11 ENCOUNTER — Other Ambulatory Visit: Payer: Self-pay | Admitting: Family Medicine

## 2014-11-11 VITALS — BP 130/70 | HR 45 | Temp 97.9°F | Resp 16 | Ht 63.0 in | Wt 191.0 lb

## 2014-11-11 DIAGNOSIS — Z23 Encounter for immunization: Secondary | ICD-10-CM

## 2014-11-11 DIAGNOSIS — I10 Essential (primary) hypertension: Secondary | ICD-10-CM | POA: Diagnosis not present

## 2014-11-11 DIAGNOSIS — L298 Other pruritus: Secondary | ICD-10-CM

## 2014-11-11 DIAGNOSIS — E785 Hyperlipidemia, unspecified: Secondary | ICD-10-CM

## 2014-11-11 DIAGNOSIS — R312 Other microscopic hematuria: Secondary | ICD-10-CM

## 2014-11-11 DIAGNOSIS — I480 Paroxysmal atrial fibrillation: Secondary | ICD-10-CM | POA: Diagnosis not present

## 2014-11-11 DIAGNOSIS — R3129 Other microscopic hematuria: Secondary | ICD-10-CM | POA: Insufficient documentation

## 2014-11-11 DIAGNOSIS — R001 Bradycardia, unspecified: Secondary | ICD-10-CM | POA: Diagnosis not present

## 2014-11-11 DIAGNOSIS — E119 Type 2 diabetes mellitus without complications: Secondary | ICD-10-CM | POA: Diagnosis not present

## 2014-11-11 DIAGNOSIS — Z79899 Other long term (current) drug therapy: Secondary | ICD-10-CM

## 2014-11-11 DIAGNOSIS — N898 Other specified noninflammatory disorders of vagina: Secondary | ICD-10-CM

## 2014-11-11 LAB — CBC
HEMATOCRIT: 41.4 % (ref 36.0–46.0)
HEMOGLOBIN: 13.9 g/dL (ref 12.0–15.0)
MCH: 27.7 pg (ref 26.0–34.0)
MCHC: 33.6 g/dL (ref 30.0–36.0)
MCV: 82.6 fL (ref 78.0–100.0)
MPV: 10.7 fL (ref 8.6–12.4)
Platelets: 173 10*3/uL (ref 150–400)
RBC: 5.01 MIL/uL (ref 3.87–5.11)
RDW: 14.9 % (ref 11.5–15.5)
WBC: 3.3 10*3/uL — ABNORMAL LOW (ref 4.0–10.5)

## 2014-11-11 LAB — COMPREHENSIVE METABOLIC PANEL
ALT: 17 U/L (ref 0–35)
AST: 19 U/L (ref 0–37)
Albumin: 3.9 g/dL (ref 3.5–5.2)
Alkaline Phosphatase: 85 U/L (ref 39–117)
BUN: 17 mg/dL (ref 6–23)
CHLORIDE: 103 meq/L (ref 96–112)
CO2: 32 mEq/L (ref 19–32)
Calcium: 9.1 mg/dL (ref 8.4–10.5)
Creat: 1.01 mg/dL (ref 0.50–1.10)
Glucose, Bld: 104 mg/dL — ABNORMAL HIGH (ref 70–99)
Potassium: 3.7 mEq/L (ref 3.5–5.3)
SODIUM: 141 meq/L (ref 135–145)
Total Bilirubin: 0.6 mg/dL (ref 0.2–1.2)
Total Protein: 7.5 g/dL (ref 6.0–8.3)

## 2014-11-11 LAB — LIPID PANEL
CHOL/HDL RATIO: 2.6 ratio
Cholesterol: 169 mg/dL (ref 0–200)
HDL: 66 mg/dL (ref >=46)
LDL Cholesterol: 85 mg/dL (ref 0–99)
Triglycerides: 90 mg/dL (ref <150)
VLDL: 18 mg/dL (ref 0–40)

## 2014-11-11 LAB — HEMOGLOBIN A1C
Hgb A1c MFr Bld: 6.9 % — ABNORMAL HIGH (ref <5.7)
MEAN PLASMA GLUCOSE: 151 mg/dL — AB (ref <117)

## 2014-11-11 LAB — PROTIME-INR
INR: 2.14 — AB (ref <1.50)
PROTHROMBIN TIME: 23.9 s — AB (ref 11.6–15.2)

## 2014-11-11 MED ORDER — VALSARTAN-HYDROCHLOROTHIAZIDE 160-25 MG PO TABS: 1.0000 | ORAL_TABLET | Freq: Every day | ORAL | Status: DC

## 2014-11-11 MED ORDER — GLIPIZIDE 5 MG PO TABS
5.0000 mg | ORAL_TABLET | Freq: Every day | ORAL | Status: DC
Start: 2014-11-11 — End: 2015-06-09

## 2014-11-11 MED ORDER — FLECAINIDE ACETATE 100 MG PO TABS: 50.0000 mg | ORAL_TABLET | Freq: Two times a day (BID) | ORAL | Status: DC

## 2014-11-11 MED ORDER — WARFARIN SODIUM 4 MG PO TABS: ORAL_TABLET | ORAL | Status: DC

## 2014-11-11 MED ORDER — SIMVASTATIN 20 MG PO TABS
20.0000 mg | ORAL_TABLET | Freq: Every day | ORAL | Status: DC
Start: 2014-11-11 — End: 2015-05-26

## 2014-11-11 MED ORDER — FLECAINIDE ACETATE 100 MG PO TABS: 100.0000 mg | ORAL_TABLET | Freq: Two times a day (BID) | ORAL | Status: DC

## 2014-11-11 MED ORDER — TERCONAZOLE 0.4 % VA CREA: 1.0000 | TOPICAL_CREAM | Freq: Every day | VAGINAL | Status: DC

## 2014-11-11 NOTE — Patient Instructions (Signed)
I will be in touch with your labs  Have a wonderful time in [place]!   I would recommend that you have your INR checked in about one month- this can be done by an urgent are clinic while you are in [place] heart rate is a little slow- since you feel well this is likely ok but I will send a message to Dr. Lovena Le.

## 2014-11-11 NOTE — Progress Notes (Addendum)
[facility]gent [facility] [address], [place] [address] [phone]  Date:  11/11/2014   Name:  Catherine Joseph   DOB:  1941-02-04   MRN:  [id number]  PCP:  Kennon Portela, MD    Chief Complaint: Follow-up; Diabetes; and Medication Refill   History of Present Illness:  Catherine Joseph is a 74 y.o. very pleasant female patient who presents with the following:  Here today for a recheck of her DM and other chronic medical issues.  She is on coumadin for persistent atrial fibrillation, this is followed by coumadin clinic. However she is a bit behind on her INR checks-  Most recent INR in April was 2.2.  She will leave for [facility] for 2 months (leaving on 6/8)- she will stay with her sister, visit some other family members and friends.  She needs refills to last her until then.  Glucose 140 this am- a little high for her. Usually closer to 100 She takes glucotrol 5mg .  She is on 25 mg of coumadin weekly.  No bleeding or bruising.    She last saw Dr. Lovena Le in 2/16.  She is on flecanide and takes 50 mg BID.  Her usual cardiologist is Dr. Johnsie Cancel and she will see him for follow-up next week  She is fasting today for labs.   She denies any CP, SOB, fatigue, syncope.   Lab Results  Component Value Date   HGBA1C 6.9 08/03/2014   BP Readings from Last 3 Encounters:  11/11/14 130/70  08/03/14 130/80  07/23/14 124/78    Pulse Readings from Last 3 Encounters:  11/11/14 45  08/03/14 51  07/23/14 56     Patient Active Problem List   Diagnosis Date Noted  . Atrial fibrillation 04/22/2014  . Anemia of chronic disease 04/10/2014  . Sepsis 04/09/2014  . Intra-abdominal abscess 04/09/2014  . Chest pain 02/11/2014  . 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 Lung nodule 02/01/2012  . Mixed hyperlipidemia 08/03/2011  . Bradycardia 08/03/2011  . Dizzy spells 08/03/2011  . Hypertension 07/22/2011  . Diabetes mellitus, type II 07/22/2011    Past Medical History   Diagnosis Date  . Hypertension   . Diabetes mellitus type II   . Syncope   . Bradycardia   . Weakness   . Cataract   . Osteoporosis     Past Surgical History  Procedure Laterality Date  . Eye surgery    . Cesarean section    . Abdominal hysterectomy      History  Substance Use Topics  . Smoking status: Never Smoker   . Smokeless tobacco: Not on file  . Alcohol Use: No    Family History  Problem Relation Age of Onset  . Diabetes Sister     Allergies  Allergen Reactions  . Codeine Other (See Comments)    vertigo    Medication list has been reviewed and updated.  Current Outpatient Prescriptions on File Prior to Visit  Medication Sig Dispense Refill  . glipiZIDE (GLUCOTROL) 5 MG tablet Take 5 mg by mouth daily before breakfast.    . simvastatin (ZOCOR) 20 MG tablet Take 20 mg by mouth daily.    Marland Kitchen UNABLE TO FIND BLOOD PRESSURE CUFF. Dx Code: I10. 1 Device 0  . valsartan-hydrochlorothiazide (DIOVAN-HCT) 160-25 MG per tablet take 1 tablet by mouth once daily 90 tablet 1  . warfarin (COUMADIN) 4 MG tablet Take nightly as directed by coumadin clinic 45 tablet 3   No  current facility-administered medications on file prior to visit.    Review of Systems:  As per HPI- otherwise negative.   Physical Examination: Filed Vitals:   11/11/14 0922  BP: 130/70  Pulse: 45  Temp: 97.9 F (36.6 C)  Resp: 16   Filed Vitals:   11/11/14 0922  Height: 5\' 3"  (1.6 m)  Weight: 191 lb (86.637 kg)   Body mass index is 33.84 kg/(m^2). Ideal Body Weight: Weight in (lb) to have BMI = 25: 140.8  GEN: WDWN, NAD, Non-toxic, A & O x 3, overweight, looks well  HEENT: Atraumatic, Normocephalic. Neck supple. No masses, No LAD.  Bilateral TM wnl, oropharynx normal.  PEERL,EOMI.   Ears and Nose: No external deformity. CV: RRR, No M/G/R. No JVD. No thrill. No extra heart sounds. PULM: CTA B, no wheezes, crackles, rhonchi. No retractions. No resp. distress. No accessory muscle  use. EXTR: No c/c/e NEURO Normal gait.  PSYCH: Normally interactive. Conversant. Not depressed or anxious appearing.  Calm demeanor.   Recounted pulse for a full 30 seconds and confirmed bradycardia as above Had her do 10 squats, pulse to approx 60 BPM  Assessment and Plan: Diabetes mellitus type 2, controlled - Plan: HM Diabetes Foot Exam, Lipid panel, Hemoglobin A1c, glipiZIDE (GLUCOTROL) 5 MG tablet, Comprehensive metabolic panel  Microhematuria  Paroxysmal atrial fibrillation - Plan: Protime-INR, warfarin (COUMADIN) 4 MG tablet, flecainide (TAMBOCOR) 100 MG tablet, DISCONTINUED: flecainide (TAMBOCOR) 100 MG tablet  Vaginal itching - Plan: terconazole (TERAZOL 7) 0.4 % vaginal cream  High risk medication use - Plan: CBC, Comprehensive metabolic panel, CANCELED: Basic metabolic panel  Dyslipidemia - Plan: simvastatin (ZOCOR) 20 MG tablet  Essential hypertension - Plan: valsartan-hydrochlorothiazide (DIOVAN-HCT) 160-25 MG per tablet  Immunization due - Plan: Td vaccine greater than or equal to 7yo preservative free IM  She would like a refill of her vaginal antifungal in case her sx return Refilled medications  Labs pending See patient instructions for more details.   Note to Dr. Johnsie Cancel regarding her bradycardia   Signed Lamar Blinks, MD  Received her labs-called but no answer.  Will have to try again  Called 5/24: discussed labs.  Overall good, A1c is ok.  She will have her INR checked in one month while on her trip  Results for orders placed or performed in visit on 11/11/14  CBC  Result Value Ref Range   WBC 3.3 (L) 4.0 - 10.5 K/uL   RBC 5.01 3.87 - 5.11 MIL/uL   Hemoglobin 13.9 12.0 - 15.0 g/dL   HCT 41.4 36.0 - 46.0 %   MCV 82.6 78.0 - 100.0 fL   MCH 27.7 26.0 - 34.0 pg   MCHC 33.6 30.0 - 36.0 g/dL   RDW 14.9 11.5 - 15.5 %   Platelets 173 150 - 400 K/uL   MPV 10.7 8.6 - 12.4 fL  Lipid panel  Result Value Ref Range   Cholesterol 169 0 - 200 mg/dL    Triglycerides 90 <150 mg/dL   HDL 66 >=46 mg/dL   Total CHOL/HDL Ratio 2.6 Ratio   VLDL 18 0 - 40 mg/dL   LDL Cholesterol 85 0 - 99 mg/dL  Protime-INR  Result Value Ref Range   Prothrombin Time 23.9 (H) 11.6 - 15.2 seconds   INR 2.14 (H) <1.50  Hemoglobin A1c  Result Value Ref Range   Hgb A1c MFr Bld 6.9 (H) <5.7 %   Mean Plasma Glucose 151 (H) <117 mg/dL  Comprehensive metabolic panel  Result Value Ref  Range   Sodium 141 135 - 145 mEq/L   Potassium 3.7 3.5 - 5.3 mEq/L   Chloride 103 96 - 112 mEq/L   CO2 32 19 - 32 mEq/L   Glucose, Bld 104 (H) 70 - 99 mg/dL   BUN 17 6 - 23 mg/dL   Creat 1.01 0.50 - 1.10 mg/dL   Total Bilirubin 0.6 0.2 - 1.2 mg/dL   Alkaline Phosphatase 85 39 - 117 U/L   AST 19 0 - 37 U/L   ALT 17 0 - 35 U/L   Total Protein 7.5 6.0 - 8.3 g/dL   Albumin 3.9 3.5 - 5.2 g/dL   Calcium 9.1 8.4 - 10.5 mg/dL

## 2014-11-12 ENCOUNTER — Telehealth: Payer: Self-pay | Admitting: *Deleted

## 2014-11-12 NOTE — Telephone Encounter (Signed)
Spoke with pt and she has a 6 week appt May 31st  so she is not late for her appt  Pt states she does not have any new medications and instructed to continue same dose of coumadin 6mg  daily except 4mg  on Mondays and Wednesdays and to keep her appt with [place] in coumadin clinic on May 31st and she states understanding.and that she will keep her appt on May 31st

## 2014-11-12 NOTE — Telephone Encounter (Signed)
-----   Message from Darreld Mclean, MD sent at 11/11/2014  8:19 PM EDT ----- Carlyon Shadow, This pt came to see me and I noticed that she is overdue for her INR check; checked this for her today- 2.14.  I will advise her to stay on her current coumadin dose (she reports 25mg  a week) and to have a recheck in 1 months.  She is traveling to [facility] for 2 months so she will have to visit an urgent care there for an INR check.  Thanks ! [place]

## 2014-11-13 ENCOUNTER — Encounter: Payer: Self-pay | Admitting: Family Medicine

## 2014-11-13 LAB — TSH: TSH: 1.245 u[IU]/mL (ref 0.350–4.500)

## 2014-11-17 NOTE — Progress Notes (Signed)
Patient ID: Catherine Joseph, female   DOB: August 10, 1940, 74 y.o.   MRN: [id number] 73 y.o.  [place] female initially referred by Dr Verline Lema 2/13 for bradycardia and dizzyness. Basically weak and "whoozy" . No SSCP. No previous w/u for structural heart disease. No history of seizures or neurologic issues. CRF;s DM/HTN and elevated lipiids. No history of hypoglycemia. Labs reviewed from 1/13 and TSH normal not anemic and HbA1c 7.1   Normal stress echo 08/27/11  Reviewed event monitor SR PAC/PVC no significant arrhythmia  Referred back by Dr Edilia Bo for atypical chest pain Last few weeks has had "tingling" in chest Not exertional lasts minutes More at night Can go to gym and Zumba class without difficulty.   Event monitor showed atrial flutter 9/15  Started on flecainide  Seen by [facility] and started on coumadin   Seen by him 07/2014 and patient had stopped flecainide due to cost but restarted last month   On coumadin but had samples of xarelto in her pocket book.  Discussed switching but she has no idea of cost Will coordinate with coumadin clinic to see if she can switch.  She is traveling to [place] for 2 months So xarelto would be nice    ROS: Denies fever, malais, weight loss, blurry vision, decreased visual acuity, cough, sputum, SOB, hemoptysis, pleuritic pain, palpitaitons, heartburn, abdominal pain, melena, lower extremity edema, claudication, or rash.  All other systems reviewed and negative  General: Affect appropriate Overweight black female  HEENT: normal Neck supple with no adenopathy JVP normal no bruits no thyromegaly Lungs clear with no wheezing and good diaphragmatic motion Heart:  S1/S2 no murmur, no rub, gallop or click PMI normal Abdomen: benighn, BS positve, no tenderness, no AAA no bruit.  No HSM or HJR Distal pulses intact with no bruits No edema Neuro non-focal Skin warm and dry No muscular weakness   Current Outpatient Prescriptions  Medication Sig  Dispense Refill  . flecainide (TAMBOCOR) 100 MG tablet Take 0.5 tablets (50 mg total) by mouth 2 (two) times daily. 90 tablet 1  . glipiZIDE (GLUCOTROL) 5 MG tablet Take 1 tablet (5 mg total) by mouth daily before breakfast. 90 tablet 3  . simvastatin (ZOCOR) 20 MG tablet Take 1 tablet (20 mg total) by mouth daily. 90 tablet 3  . terconazole (TERAZOL 7) 0.4 % vaginal cream Place 1 applicator vaginally at bedtime. Use for one week 45 g 0  . UNABLE TO FIND BLOOD PRESSURE CUFF. Dx Code: I10. 1 Device 0  . valsartan-hydrochlorothiazide (DIOVAN-HCT) 160-25 MG per tablet Take 1 tablet by mouth daily. 90 tablet 3  . warfarin (COUMADIN) 4 MG tablet Take nightly as directed by coumadin clinic 60 tablet 3   No current facility-administered medications for this visit.    Allergies  Codeine  Electrocardiogram:   08/10/14 SR rate 59 normal   Assessment and Plan PAF:  Continue flecainide improved  INR today discuss with clinic changing to xarelto HTN:  Well controlled.  Continue current medications and low sodium Dash type diet.   Chol:   Cholesterol is at goal.  Continue current dose of statin and diet Rx.  No myalgias or side effects.  F/U  LFT's in 6 months. Lab Results  Component Value Date   LDLCALC 85 11/11/2014   DM:  Discussed low carb diet.  Target hemoglobin A1c is 6.5 or less.  Continue current medications. Lab Results  Component Value Date   HGBA1C 6.9* 11/11/2014    F/U with me in  6 months

## 2014-11-19 ENCOUNTER — Ambulatory Visit (INDEPENDENT_AMBULATORY_CARE_PROVIDER_SITE_OTHER): Payer: Medicare HMO | Admitting: *Deleted

## 2014-11-19 ENCOUNTER — Encounter: Payer: Self-pay | Admitting: Cardiovascular Disease

## 2014-11-19 ENCOUNTER — Ambulatory Visit (INDEPENDENT_AMBULATORY_CARE_PROVIDER_SITE_OTHER): Payer: Medicare HMO | Admitting: Cardiovascular Disease

## 2014-11-19 VITALS — BP 150/90 | HR 72 | Ht 63.0 in | Wt 192.1 lb

## 2014-11-19 DIAGNOSIS — I480 Paroxysmal atrial fibrillation: Secondary | ICD-10-CM

## 2014-11-19 LAB — POCT INR: INR: 2.1

## 2014-11-19 NOTE — Patient Instructions (Signed)
Medication Instructions:  PT  MAY SWITCH FROM WARFARIN   TO XARELTO  WILL CHECK  PRICE   Labwork: NONE  Testing/Procedures: NONE  Follow-Up: Your physician wants you to follow-up in:   [place] will receive a reminder letter in the mail two months in advance. If you don't receive a letter, please call our office to schedule the follow-up appointment.  Any Other Special Instructions Will Be Listed Below (If Applicable).

## 2015-01-09 ENCOUNTER — Ambulatory Visit (INDEPENDENT_AMBULATORY_CARE_PROVIDER_SITE_OTHER): Payer: Medicare HMO | Admitting: *Deleted

## 2015-01-09 DIAGNOSIS — I480 Paroxysmal atrial fibrillation: Secondary | ICD-10-CM

## 2015-01-09 LAB — POCT INR: INR: 2.4

## 2015-02-11 ENCOUNTER — Other Ambulatory Visit: Payer: Self-pay | Admitting: Family Medicine

## 2015-02-20 ENCOUNTER — Ambulatory Visit (INDEPENDENT_AMBULATORY_CARE_PROVIDER_SITE_OTHER): Payer: Medicare HMO | Admitting: *Deleted

## 2015-02-20 DIAGNOSIS — I480 Paroxysmal atrial fibrillation: Secondary | ICD-10-CM

## 2015-02-20 LAB — POCT INR: INR: 2.6

## 2015-02-26 ENCOUNTER — Encounter: Payer: Medicare HMO | Admitting: Family Medicine

## 2015-03-04 ENCOUNTER — Encounter: Payer: Self-pay | Admitting: Cardiovascular Disease

## 2015-03-12 ENCOUNTER — Encounter: Payer: Self-pay | Admitting: Family Medicine

## 2015-03-17 ENCOUNTER — Ambulatory Visit (INDEPENDENT_AMBULATORY_CARE_PROVIDER_SITE_OTHER): Payer: Medicare HMO | Admitting: *Deleted

## 2015-03-17 DIAGNOSIS — I480 Paroxysmal atrial fibrillation: Secondary | ICD-10-CM

## 2015-03-17 LAB — CBC
HCT: 43.3 % (ref 36.0–46.0)
HEMOGLOBIN: 14.1 g/dL (ref 12.0–15.0)
MCHC: 32.6 g/dL (ref 30.0–36.0)
MCV: 83.9 fl (ref 78.0–100.0)
PLATELETS: 150 10*3/uL (ref 150.0–400.0)
RDW: 14.5 % (ref 11.5–15.5)
WBC: 3.8 10*3/uL — ABNORMAL LOW (ref 4.0–10.5)

## 2015-03-17 LAB — BASIC METABOLIC PANEL
BUN: 15 mg/dL (ref 6–23)
CO2: 34 mEq/L — ABNORMAL HIGH (ref 19–32)
CREATININE: 0.91 mg/dL (ref 0.40–1.20)
Calcium: 9.8 mg/dL (ref 8.4–10.5)
Chloride: 101 mEq/L (ref 96–112)
GFR: 63.85 mL/min (ref >60.00)
GLUCOSE: 145 mg/dL — AB (ref 70–99)
POTASSIUM: 3.7 meq/L (ref 3.5–5.1)
Sodium: 141 mEq/L (ref 135–145)

## 2015-03-17 LAB — POCT INR: INR: 2.8

## 2015-03-17 MED ORDER — RIVAROXABAN 20 MG PO TABS: 20.0000 mg | ORAL_TABLET | Freq: Every day | ORAL | Status: DC

## 2015-03-17 NOTE — Patient Instructions (Addendum)
Pt was started on Xarelto 20 mg daily  for persistent Atrial Fib  on September 26,2016 .    Reviewed patients medication list.  Pt in not  currently on any combined P-gp and strong CYP3A4 inhibitors/inducers (ketoconazole, traconazole, ritonavir, carbamazepine, phenytoin, rifampin, St. John's wort).  Reviewed labs.  SCr 1.01 (11/11/2014)  SrCr 0.91 on 03/17/2015 , Weight  89.6 kg  CrCl 74.393 .  Dose is appropriate based on above findings.   Hgb 14.1 and HCT 43.3  A full discussion of the nature of anticoagulants has been carried out.  A benefit/risk analysis has been presented to the patient, so that they understand the justification for choosing anticoagulation with Xarelto at this time.  The need for compliance is stressed.  Pt is aware to take the medication once daily with the largest meal of the day.  Side effects of potential bleeding are discussed, including unusual colored urine or stools, coughing up blood or coffee ground emesis, nose bleeds or serious fall or head trauma.  Discussed signs and symptoms of stroke. The patient should avoid any OTC items containing aspirin or ibuprofen.  Avoid alcohol consumption.   Call if any signs of abnormal bleeding.  Discussed financial obligations  Pt states she is to pay 40 dollars per month and she will be able to do this .  Next lab test test in 1 month.

## 2015-03-24 ENCOUNTER — Encounter: Payer: Medicare HMO | Admitting: Family Medicine

## 2015-04-14 ENCOUNTER — Ambulatory Visit (INDEPENDENT_AMBULATORY_CARE_PROVIDER_SITE_OTHER): Payer: Medicare HMO

## 2015-04-14 DIAGNOSIS — Z5181 Encounter for therapeutic drug level monitoring: Secondary | ICD-10-CM

## 2015-04-14 DIAGNOSIS — I480 Paroxysmal atrial fibrillation: Secondary | ICD-10-CM | POA: Diagnosis not present

## 2015-04-14 LAB — BASIC METABOLIC PANEL
BUN: 20 mg/dL (ref 7–25)
CHLORIDE: 100 mmol/L (ref 98–110)
CO2: 30 mmol/L (ref 20–31)
Calcium: 9.7 mg/dL (ref 8.6–10.4)
Creat: 1.09 mg/dL — ABNORMAL HIGH (ref 0.60–0.93)
GLUCOSE: 192 mg/dL — AB (ref 65–99)
POTASSIUM: 3.6 mmol/L (ref 3.5–5.3)
Sodium: 139 mmol/L (ref 135–146)

## 2015-04-14 LAB — CBC
HEMATOCRIT: 41.5 % (ref 36.0–46.0)
HEMOGLOBIN: 13.8 g/dL (ref 12.0–15.0)
MCH: 27.8 pg (ref 26.0–34.0)
MCHC: 33.3 g/dL (ref 30.0–36.0)
MCV: 83.7 fL (ref 78.0–100.0)
MPV: 10.3 fL (ref 8.6–12.4)
Platelets: 181 10*3/uL (ref 150–400)
RBC: 4.96 MIL/uL (ref 3.87–5.11)
RDW: 14.4 % (ref 11.5–15.5)
WBC: 4.2 10*3/uL (ref 4.0–10.5)

## 2015-04-14 NOTE — Progress Notes (Signed)
Pt was started on Xarelto for afib on 03/17/15 by Dr Johnsie Cancel.    Reviewed patients medication list.  Pt is not currently on any combined P-gp and strong CYP3A4 inhibitors/inducers (ketoconazole, traconazole, ritonavir, carbamazepine, phenytoin, rifampin, St. John's wort).  Reviewed labs.  SCr 1.09, Weight 200.8 lbs, CrCl-63.14.  Dose appropriate based on CrCl.   Hgb and HCT Within Normal Limits 13.8/41.5 on 04/14/15.  Pt assistance form given to Vaughan Basta, RN to complete for Xarelto.  Pt is to continue on same dosage of Xarelto 20mg  once daily. Called relayed information to pt to continue on same dosage Xarelto.

## 2015-04-14 NOTE — Patient Instructions (Signed)

## 2015-04-30 ENCOUNTER — Telehealth: Payer: Self-pay

## 2015-04-30 ENCOUNTER — Telehealth: Payer: Self-pay | Admitting: Cardiovascular Disease

## 2015-04-30 NOTE — Telephone Encounter (Signed)
New Message  Pt following up on form being filled by Dr Johnsie Cancel. Please call back and discuss.

## 2015-04-30 NOTE — Telephone Encounter (Signed)
Calling wanting to know if form she left on 10/24 had been completed by Dr. Johnsie Cancel.  The form is for [employer] for [employer].  Spoke w/Linda Reiland,RN (prior authorization nurse) who has form and will mail to pt.  Advised pt that she has a form that she needs to fill out and mail in to [place] with the physician form.  She verbalizes understanding and will mail both forms to [employer].

## 2015-04-30 NOTE — Telephone Encounter (Signed)
Patient Assistance forms mailed to patient for Xarelto. She wants to send them herself along with financial information.

## 2015-05-15 ENCOUNTER — Other Ambulatory Visit: Payer: Self-pay | Admitting: Family Medicine

## 2015-05-22 ENCOUNTER — Ambulatory Visit: Payer: Medicare HMO | Admitting: Cardiovascular Disease

## 2015-05-22 NOTE — Progress Notes (Signed)
Patient ID: Catherine Joseph, female   DOB: May 04, 1941, 74 y.o.   MRN: [id number]   74 y.o.  [place] female initially referred by Dr Verline Lema 2/13 for bradycardia and dizzyness. Basically weak and "whoozy" . No SSCP. No previous w/u for structural heart disease. No history of seizures or neurologic issues. CRF;s DM/HTN and elevated lipiids. No history of hypoglycemia. Labs reviewed from 1/13 and TSH normal not anemic and HbA1c 7.1   Normal stress echo 08/27/11  Reviewed event monitor SR PAC/PVC no significant arrhythmia  Referred back by Dr Edilia Bo for atypical chest pain 11/19/14  Had "tingling" in chest Not exertional lasts minutes More at night Can go to gym and Zumba class without difficulty.   Event monitor showed atrial flutter 9/15  Started on flecainide  Seen by [facility] and started on coumadin   Seen by him 07/2014 and patient had stopped flecainide due to cost but then restarted   Changed to xarelto    Travels to [place] for extended periods    ROS: Denies fever, malais, weight loss, blurry vision, decreased visual acuity, cough, sputum, SOB, hemoptysis, pleuritic pain, palpitaitons, heartburn, abdominal pain, melena, lower extremity edema, claudication, or rash.  All other systems reviewed and negative  General: Affect appropriate Overweight black female  HEENT: normal Neck supple with no adenopathy JVP normal no bruits no thyromegaly Lungs clear with no wheezing and good diaphragmatic motion Heart:  S1/S2 SEM  murmur, no rub, gallop or click PMI normal Abdomen: benighn, BS positve, no tenderness, no AAA no bruit.  No HSM or HJR Distal pulses intact with no bruits No edema Neuro non-focal Skin warm and dry No muscular weakness   Current Outpatient Prescriptions  Medication Sig Dispense Refill  . flecainide (TAMBOCOR) 100 MG tablet Take 0.5 tablets (50 mg total) by mouth 2 (two) times daily. 90 tablet 3  . glipiZIDE (GLUCOTROL) 5 MG tablet Take 1 tablet (5 mg total)  by mouth daily before breakfast. 90 tablet 3  . rivaroxaban (XARELTO) 20 MG TABS tablet Take 1 tablet (20 mg total) by mouth daily with supper. 90 tablet 3  . simvastatin (ZOCOR) 20 MG tablet Take 1 tablet (20 mg total) by mouth daily. 90 tablet 3  . terconazole (TERAZOL 7) 0.4 % vaginal cream Place 1 applicator vaginally at bedtime. Use for one week 45 g 0  . valsartan-hydrochlorothiazide (DIOVAN-HCT) 160-25 MG tablet Take 1 tablet by mouth daily. 90 tablet 3   No current facility-administered medications for this visit.    Allergies  Codeine  Electrocardiogram:   08/10/14 SR rate 59 normal   Assessment and Plan PAF:  Continue flecainide improved  Continue xarelto  HTN:  Well controlled.  Continue current medications and low sodium Dash type diet.   Chol:   Cholesterol is at goal.  Continue current dose of statin and diet Rx.  No myalgias or side effects.  F/U  LFT's in 6 months. Lab Results  Component Value Date   LDLCALC 85 11/11/2014   DM:  Discussed low carb diet.  Target hemoglobin A1c is 6.5 or less.  Continue current medications. Lab Results  Component Value Date   HGBA1C 6.9* 11/11/2014    F/U with me in 6 months   Jenkins Rouge

## 2015-05-26 ENCOUNTER — Ambulatory Visit (INDEPENDENT_AMBULATORY_CARE_PROVIDER_SITE_OTHER): Payer: Medicare HMO | Admitting: Cardiovascular Disease

## 2015-05-26 ENCOUNTER — Encounter: Payer: Self-pay | Admitting: Cardiovascular Disease

## 2015-05-26 VITALS — BP 140/74 | HR 71 | Ht 63.0 in | Wt 202.8 lb

## 2015-05-26 DIAGNOSIS — I480 Paroxysmal atrial fibrillation: Secondary | ICD-10-CM | POA: Diagnosis not present

## 2015-05-26 DIAGNOSIS — I10 Essential (primary) hypertension: Secondary | ICD-10-CM

## 2015-05-26 DIAGNOSIS — Z5181 Encounter for therapeutic drug level monitoring: Secondary | ICD-10-CM | POA: Diagnosis not present

## 2015-05-26 DIAGNOSIS — E785 Hyperlipidemia, unspecified: Secondary | ICD-10-CM | POA: Diagnosis not present

## 2015-05-26 MED ORDER — SIMVASTATIN 20 MG PO TABS: 20.0000 mg | ORAL_TABLET | Freq: Every day | ORAL | Status: DC

## 2015-05-26 MED ORDER — VALSARTAN-HYDROCHLOROTHIAZIDE 160-25 MG PO TABS: 1.0000 | ORAL_TABLET | Freq: Every day | ORAL | Status: DC

## 2015-05-26 MED ORDER — RIVAROXABAN 20 MG PO TABS: 20.0000 mg | ORAL_TABLET | Freq: Every day | ORAL | Status: DC

## 2015-05-26 MED ORDER — FLECAINIDE ACETATE 100 MG PO TABS: 50.0000 mg | ORAL_TABLET | Freq: Two times a day (BID) | ORAL | Status: DC

## 2015-05-26 NOTE — Patient Instructions (Addendum)
Medication Instructions:  Your physician recommends that you continue on your current medications as directed. Please refer to the Current Medication list given to you today.  Labwork: NONE  Testing/Procedures: NONE  Follow-Up: Your physician wants you to follow-up in: 60 month with Dr. Johnsie Cancel. You will receive a reminder letter in the mail two months in advance. If you don't receive a letter, please call our office to schedule the follow-up appointment.   If you need a refill on your cardiac medications before your next appointment, please call your pharmacy.

## 2015-06-09 ENCOUNTER — Other Ambulatory Visit: Payer: Self-pay | Admitting: Family Medicine

## 2015-06-09 ENCOUNTER — Ambulatory Visit (INDEPENDENT_AMBULATORY_CARE_PROVIDER_SITE_OTHER): Payer: Medicare HMO | Admitting: Emergency Medicine

## 2015-06-09 ENCOUNTER — Ambulatory Visit (INDEPENDENT_AMBULATORY_CARE_PROVIDER_SITE_OTHER): Payer: Medicare HMO

## 2015-06-09 VITALS — BP 160/90 | HR 53 | Temp 98.6°F | Resp 18 | Ht 63.0 in | Wt 201.6 lb

## 2015-06-09 DIAGNOSIS — M545 Low back pain, unspecified: Secondary | ICD-10-CM

## 2015-06-09 DIAGNOSIS — E119 Type 2 diabetes mellitus without complications: Secondary | ICD-10-CM | POA: Diagnosis not present

## 2015-06-09 DIAGNOSIS — Z23 Encounter for immunization: Secondary | ICD-10-CM | POA: Diagnosis not present

## 2015-06-09 DIAGNOSIS — L298 Other pruritus: Secondary | ICD-10-CM

## 2015-06-09 DIAGNOSIS — I10 Essential (primary) hypertension: Secondary | ICD-10-CM

## 2015-06-09 DIAGNOSIS — N898 Other specified noninflammatory disorders of vagina: Secondary | ICD-10-CM

## 2015-06-09 LAB — POCT URINALYSIS DIP (MANUAL ENTRY)
BILIRUBIN UA: NEGATIVE
BILIRUBIN UA: NEGATIVE
LEUKOCYTES UA: NEGATIVE
Nitrite, UA: NEGATIVE
Protein Ur, POC: NEGATIVE
SPEC GRAV UA: 1.01
Urobilinogen, UA: 0.2
pH, UA: 5.5

## 2015-06-09 LAB — POC MICROSCOPIC URINALYSIS (UMFC): MUCUS RE: ABSENT

## 2015-06-09 LAB — POCT GLYCOSYLATED HEMOGLOBIN (HGB A1C): Hemoglobin A1C: 8.2

## 2015-06-09 MED ORDER — GLIPIZIDE 5 MG PO TABS: 10.0000 mg | ORAL_TABLET | Freq: Every day | ORAL | Status: DC

## 2015-06-09 MED ORDER — CELECOXIB 100 MG PO CAPS: 100.0000 mg | ORAL_CAPSULE | Freq: Two times a day (BID) | ORAL | Status: DC

## 2015-06-09 MED ORDER — TERCONAZOLE 0.4 % VA CREA: 1.0000 | TOPICAL_CREAM | Freq: Every day | VAGINAL | Status: DC

## 2015-06-09 NOTE — Patient Instructions (Signed)

## 2015-06-09 NOTE — Progress Notes (Signed)
Subjective:  Patient ID: Catherine Joseph, female    DOB: 01-25-1941  Age: 74 y.o. MRN: [id number]  CC: Back Pain; Follow-up; and Medication Refill   HPI Catherine Joseph presents  patient has a history of type 2 diabetes previously controlled on 5 mg of Glucotrol daily over the last 6 weeks or so she's noticed that her blood sugars been escaping gradually rising until she became alarmed today when 195 fasting. She denies any symptoms associated with her elevated sugar. Nausea vomiting stool change rash she's not eating differently or changing her diet. She is at not had any antecedent illness. She is also complaining of back pain on the right side in the low back nonradiating. There is no numbness tingling or weakness in her leg. She denies any overuse or injury. As the pain is worse when she bends or lifts   History Catherine Joseph has a past medical history of Hypertension; Diabetes mellitus type II; Syncope; Bradycardia; Weakness; Cataract; and Osteoporosis.   She has past surgical history that includes Eye surgery; Cesarean section; and Abdominal hysterectomy.   Her  family history includes Diabetes in her sister.  She   reports that she has never smoked. She does not have any smokeless tobacco history on file. She reports that she does not drink alcohol or use illicit drugs.  Outpatient Prescriptions Prior to Visit  Medication Sig Dispense Refill  . flecainide (TAMBOCOR) 100 MG tablet Take 0.5 tablets (50 mg total) by mouth 2 (two) times daily. 90 tablet 3  . rivaroxaban (XARELTO) 20 MG TABS tablet Take 1 tablet (20 mg total) by mouth daily with supper. 90 tablet 3  . simvastatin (ZOCOR) 20 MG tablet Take 1 tablet (20 mg total) by mouth daily. 90 tablet 3  . valsartan-hydrochlorothiazide (DIOVAN-HCT) 160-25 MG tablet Take 1 tablet by mouth daily. 90 tablet 3  . glipiZIDE (GLUCOTROL) 5 MG tablet Take 1 tablet (5 mg total) by mouth daily before breakfast. 90 tablet 3  .  terconazole (TERAZOL 7) 0.4 % vaginal cream Place 1 applicator vaginally at bedtime. Use for one week 45 g 0   No facility-administered medications prior to visit.    Social History   Social History  . Marital Status: Widowed    Spouse Name: N/A  . Number of Children: N/A  . Years of Education: N/A   Social History Main Topics  . Smoking status: Never Smoker   . Smokeless tobacco: None  . Alcohol Use: No  . Drug Use: No  . Sexual Activity: Not Asked   Other Topics Concern  . None   Social History Narrative     Review of Systems  Constitutional: Negative for fever, chills and appetite change.  HENT: Negative for congestion, ear pain, postnasal drip, sinus pressure and sore throat.   Eyes: Negative for pain and redness.  Respiratory: Negative for cough, shortness of breath and wheezing.   Cardiovascular: Negative for leg swelling.  Gastrointestinal: Negative for nausea, vomiting, abdominal pain, diarrhea, constipation and blood in stool.  Endocrine: Negative for polyuria.  Genitourinary: Negative for dysuria, urgency, frequency and flank pain.  Musculoskeletal: Positive for back pain. Negative for gait problem.  Skin: Negative for rash.  Neurological: Negative for weakness and headaches.  Psychiatric/Behavioral: Negative for confusion and decreased concentration. The patient is not nervous/anxious.     Objective:  BP 160/90 mmHg  Pulse 53  Temp(Src) 98.6 F (37 C) (Oral)  Resp 18  Ht 5\' 3"  (1.6 m)  Wt 201  lb 9.6 oz (91.445 kg)  BMI 35.72 kg/m2  SpO2 96%  Physical Exam  Constitutional: She is oriented to person, place, and time. She appears well-developed and well-nourished.  HENT:  Head: Normocephalic and atraumatic.  Eyes: Conjunctivae are normal. Pupils are equal, round, and reactive to light.  Pulmonary/Chest: Effort normal.  Musculoskeletal: She exhibits no edema.       Lumbar back: She exhibits tenderness.  Neurological: She is alert and oriented to  person, place, and time.  Skin: Skin is dry.  Psychiatric: She has a normal mood and affect. Her behavior is normal. Thought content normal.      Assessment & Plan:   Catherine Joseph was seen today for back pain, follow-up and medication refill.  Diagnoses and all orders for this visit:  Essential hypertension -     POCT glycosylated hemoglobin (Hb A1C) -     POCT urinalysis dipstick -     POCT Microscopic Urinalysis ([facility])  Diabetes mellitus without complication (HCC) -     POCT glycosylated hemoglobin (Hb A1C) -     POCT urinalysis dipstick -     POCT Microscopic Urinalysis ([facility])  Right-sided low back pain without sciatica -     DG Lumbar Spine Complete; Future  Vaginal itching -     terconazole (TERAZOL 7) 0.4 % vaginal cream; Place 1 applicator vaginally at bedtime. Use for one week  Needs flu shot -     Flu Vaccine QUAD 36+ mos IM  Other orders -     glipiZIDE (GLUCOTROL) 5 MG tablet; Take 2 tablets (10 mg total) by mouth daily before breakfast. -     celecoxib (CELEBREX) 100 MG capsule; Take 1 capsule (100 mg total) by mouth 2 (two) times daily.   I have changed Ms. Smallman's glipiZIDE. I am also having her start on celecoxib. Additionally, I am having her maintain her rivaroxaban, flecainide, simvastatin, valsartan-hydrochlorothiazide, and terconazole.  Meds ordered this encounter  Medications  . glipiZIDE (GLUCOTROL) 5 MG tablet    Sig: Take 2 tablets (10 mg total) by mouth daily before breakfast.    Dispense:  90 tablet    Refill:  3  . celecoxib (CELEBREX) 100 MG capsule    Sig: Take 1 capsule (100 mg total) by mouth 2 (two) times daily.    Dispense:  60 capsule    Refill:  0  . terconazole (TERAZOL 7) 0.4 % vaginal cream    Sig: Place 1 applicator vaginally at bedtime. Use for one week    Dispense:  45 g    Refill:  0   My first thought treating this patient would be to add metformin but she said she didn't tolerate metformin in the past with GI side  effects so I think that's not a good choice. I think it be safe to increase her Glucotrol to 10 mg daily watching her sugar carefully. She will follow-up with Dr. Edilia Bo  Appropriate red flag conditions were discussed with the patient as well as actions that should be taken.  Patient expressed his understanding.  Follow-up: Return in about 1 month (around 07/10/2015).  Roselee Culver, MD   Results for orders placed or performed in visit on 06/09/15  POCT glycosylated hemoglobin (Hb A1C)  Result Value Ref Range   Hemoglobin A1C 8.2   POCT urinalysis dipstick  Result Value Ref Range   Color, UA yellow yellow   Clarity, UA clear clear   Glucose, UA >=1,000 (A) negative   Bilirubin, UA  negative negative   Ketones, POC UA negative negative   Spec Grav, UA 1.010    Blood, UA moderate (A) negative   pH, UA 5.5    Protein Ur, POC negative negative   Urobilinogen, UA 0.2    Nitrite, UA Negative Negative   Leukocytes, UA Negative Negative  POCT Microscopic Urinalysis ([facility])  Result Value Ref Range   WBC,UR,HPF,POC None None WBC/hpf   RBC,UR,HPF,POC Few (A) None RBC/hpf   Bacteria None None, Too numerous to count   Mucus Absent Absent   Epithelial Cells, UR Per Microscopy None None, Too numerous to count cells/hpf     [facility] reading (PRIMARY) by  Dr. Ouida Sills .

## 2015-06-12 ENCOUNTER — Telehealth: Payer: Self-pay | Admitting: Cardiovascular Disease

## 2015-06-12 NOTE — Telephone Encounter (Signed)
New message ° ° ° ° ° °Patient calling the office for samples of medication: ° ° °1.  What medication and dosage are you requesting samples for? xarelto 20mg °2.  Are you currently out of this medication? Almost out ° ° °

## 2015-06-12 NOTE — Telephone Encounter (Signed)
called pt to let her know xarelto 20 mg samples placed up front, pt aware

## 2015-06-24 ENCOUNTER — Telehealth: Payer: Self-pay

## 2015-06-24 NOTE — Telephone Encounter (Signed)
[facility] had pt on the other line and she have okayed the knee brace,. Please call [phone] and the fax number for the order is [phone] The ref number is [id number]

## 2015-06-30 NOTE — Telephone Encounter (Signed)
Called and was able to get through.  According to the rep they have faxed [place] a form to sign- let her know that I have not seen this form. They will refax

## 2015-07-02 ENCOUNTER — Encounter: Payer: Self-pay | Admitting: Family Medicine

## 2015-07-02 ENCOUNTER — Ambulatory Visit (INDEPENDENT_AMBULATORY_CARE_PROVIDER_SITE_OTHER): Payer: Commercial Managed Care - HMO | Admitting: Family Medicine

## 2015-07-02 VITALS — BP 144/72 | HR 56 | Temp 98.3°F | Resp 16 | Ht 63.25 in | Wt 200.6 lb

## 2015-07-02 DIAGNOSIS — Z1211 Encounter for screening for malignant neoplasm of colon: Secondary | ICD-10-CM | POA: Diagnosis not present

## 2015-07-02 DIAGNOSIS — IMO0001 Reserved for inherently not codable concepts without codable children: Secondary | ICD-10-CM

## 2015-07-02 DIAGNOSIS — Z0000 Encounter for general adult medical examination without abnormal findings: Secondary | ICD-10-CM

## 2015-07-02 DIAGNOSIS — M545 Low back pain, unspecified: Secondary | ICD-10-CM

## 2015-07-02 DIAGNOSIS — E1165 Type 2 diabetes mellitus with hyperglycemia: Secondary | ICD-10-CM

## 2015-07-02 DIAGNOSIS — I480 Paroxysmal atrial fibrillation: Secondary | ICD-10-CM

## 2015-07-02 DIAGNOSIS — Z79899 Other long term (current) drug therapy: Secondary | ICD-10-CM | POA: Diagnosis not present

## 2015-07-02 LAB — CBC
HCT: 42 % (ref 36.0–46.0)
HEMOGLOBIN: 13.7 g/dL (ref 12.0–15.0)
MCH: 27.7 pg (ref 26.0–34.0)
MCHC: 32.6 g/dL (ref 30.0–36.0)
MCV: 85 fL (ref 78.0–100.0)
MPV: 10.8 fL (ref 8.6–12.4)
PLATELETS: 167 10*3/uL (ref 150–400)
RBC: 4.94 MIL/uL (ref 3.87–5.11)
RDW: 14 % (ref 11.5–15.5)
WBC: 3.2 10*3/uL — AB (ref 4.0–10.5)

## 2015-07-02 LAB — COMPREHENSIVE METABOLIC PANEL
ALBUMIN: 3.9 g/dL (ref 3.6–5.1)
ALT: 12 U/L (ref 6–29)
AST: 15 U/L (ref 10–35)
Alkaline Phosphatase: 73 U/L (ref 33–130)
BILIRUBIN TOTAL: 0.6 mg/dL (ref 0.2–1.2)
BUN: 17 mg/dL (ref 7–25)
CO2: 30 mmol/L (ref 20–31)
CREATININE: 1.01 mg/dL — AB (ref 0.60–0.93)
Calcium: 9.1 mg/dL (ref 8.6–10.4)
Chloride: 102 mmol/L (ref 98–110)
Glucose, Bld: 218 mg/dL — ABNORMAL HIGH (ref 65–99)
Potassium: 3.7 mmol/L (ref 3.5–5.3)
SODIUM: 138 mmol/L (ref 135–146)
Total Protein: 6.9 g/dL (ref 6.1–8.1)

## 2015-07-02 MED ORDER — GLIPIZIDE 10 MG PO TABS: 10.0000 mg | ORAL_TABLET | Freq: Two times a day (BID) | ORAL | Status: DC

## 2015-07-02 MED ORDER — METFORMIN HCL ER (MOD) 500 MG PO TB24: 500.0000 mg | ORAL_TABLET | Freq: Every day | ORAL | Status: DC

## 2015-07-02 NOTE — Progress Notes (Signed)
[facility] [address] [place] [address] [phone]  Date:  07/02/2015   Name:  Catherine Joseph   DOB:  1941-02-23   MRN:  [id number]  PCP:  Lamar Blinks, MD    Chief Complaint: Annual Exam and Medication Refill   History of Present Illness:  Catherine Joseph is a 75 y.o. very pleasant female patient who presents with the following:  Here today for a medicare wellness exam.  History of a fib, DM, in 2015 she had an intraabdominal abscess but has recovered from this.   On xarelto for her a fib Microhematuria was evaluated last year and found to be benign by urology  Due for an eye exam  States that she has noted a pain in her right side for about one month- she went to [facility] clinic and wasrx celebrex but she did not take it due to expense.    X-rays as below:  LUMBAR SPINE - COMPLETE 4+ VIEW  COMPARISON: CT abdomen and pelvis 06/11/2014.  FINDINGS: Vertebral body height and alignment are maintained. There is some loss of disc space height with endplate spurring at 075-GRM. Paraspinous structures are unremarkable.  IMPRESSION: No acute abnormality.  Degenerative disease L5-S1 does not appear markedly changed compared to the prior exam.  She states that her back still hurts some but it does feel better.  She would like to try some heat on her back which is fine   She did have a colonoscopy she thinks approx 2010 or 2011 in [place].  She is not sure about when she was supposed to have her follow-up screening.    She increased her glipizide dose to 10 BID about one months ago. She was not able to tolerate metformin in the past due to non-specific malaise but she would like to try this again.  She is checking her glucose BID right now and still always getting glucose over 200 - she is concerned about this and would like to work on her control GFR: 51 so she may use metformin  Will not measure A1c today as this was done less than a  month ago.  S/p both pneumovax and prevnar  Flu shot is UTD   Lab Results  Component Value Date   HGBA1C 8.2 06/09/2015    BP Readings from Last 3 Encounters:  07/02/15 154/75  06/09/15 160/90  05/26/15 140/74     Patient Active Problem List   Diagnosis Date Noted  . Microhematuria 11/11/2014  . Atrial fibrillation ([place]) 04/22/2014  . Anemia of chronic disease 04/10/2014  . Sepsis ([place]) 04/09/2014  . Intra-abdominal abscess ([place]) 04/09/2014  . Chest pain 02/11/2014  . 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 Lung nodule 02/01/2012  . Mixed hyperlipidemia 08/03/2011  . Bradycardia 08/03/2011  . Dizzy spells 08/03/2011  . Hypertension 07/22/2011  . Diabetes mellitus, type II ([place]) 07/22/2011    Past Medical History  Diagnosis Date  . Hypertension   . Diabetes mellitus type II   . Syncope   . Bradycardia   . Weakness   . Cataract   . Osteoporosis   . Anemia   . Chronic kidney disease     Past Surgical History  Procedure Laterality Date  . Eye surgery    . Cesarean section    . Abdominal hysterectomy      Social History  Substance Use Topics  . Smoking status: Never Smoker   . Smokeless tobacco: None  . Alcohol Use:  No    Family History  Problem Relation Age of Onset  . Diabetes Sister   . Hyperlipidemia Sister   . Hypertension Sister   . Diabetes Brother   . Hyperlipidemia Brother   . Hypertension Brother     Allergies  Allergen Reactions  . Codeine Other (See Comments)    vertigo    Medication list has been reviewed and updated.  Current Outpatient Prescriptions on File Prior to Visit  Medication Sig Dispense Refill  . flecainide (TAMBOCOR) 100 MG tablet Take 0.5 tablets (50 mg total) by mouth 2 (two) times daily. 90 tablet 3  . glipiZIDE (GLUCOTROL) 5 MG tablet Take 2 tablets (10 mg total) by mouth daily before breakfast. 90 tablet 3  . rivaroxaban (XARELTO) 20 MG TABS tablet Take 1 tablet (20 mg total) by mouth daily with supper. 90 tablet  3  . simvastatin (ZOCOR) 20 MG tablet Take 1 tablet (20 mg total) by mouth daily. 90 tablet 3  . terconazole (TERAZOL 7) 0.4 % vaginal cream Place 1 applicator vaginally at bedtime. Use for one week 45 g 0  . valsartan-hydrochlorothiazide (DIOVAN-HCT) 160-25 MG tablet Take 1 tablet by mouth daily. 90 tablet 3  . celecoxib (CELEBREX) 100 MG capsule Take 1 capsule (100 mg total) by mouth 2 (two) times daily. (Patient not taking: Reported on 07/02/2015) 60 capsule 0   No current facility-administered medications on file prior to visit.    Review of Systems:  As per HPI- otherwise negative.   Physical Examination: Filed Vitals:   07/02/15 0923  BP: 154/75  Pulse: 56  Temp: 98.3 F (36.8 C)  Resp: 16   Filed Vitals:   07/02/15 0923  Height: 5' 3.25" (1.607 m)  Weight: 200 lb 9.6 oz (90.992 kg)   Body mass index is 35.23 kg/(m^2). Ideal Body Weight: Weight in (lb) to have BMI = 25: 142  GEN: WDWN, NAD, Non-toxic, A & O x 3, obese, looks well HEENT: Atraumatic, Normocephalic. Neck supple. No masses, No LAD.  Bilateral TM wnl, oropharynx normal.  PEERL,EOMI.   Ears and Nose: No external deformity. CV: atrial fib not currently apparent, No M/G/R. No JVD. No thrill. No extra heart sounds. PULM: CTA B, no wheezes, crackles, rhonchi. No retractions. No resp. distress. No accessory muscle use. ABD: S, NT, ND. No rebound. No HSM. EXTR: No c/c/e NEURO Normal gait.  PSYCH: Normally interactive. Conversant. Not depressed or anxious appearing.  Calm demeanor.  Breast: normal exam, no masses/ dimpling/ discharge She points out the paralumbar muscles on the right side as the area of concern but she does not have any skin lesion or tenderness to palpation   Assessment and Plan: Physical exam  Right-sided low back pain without sciatica  Uncontrolled type 2 diabetes mellitus without complication, without long-term current use of insulin ([place]) - Plan: metFORMIN (GLUMETZA) 500 MG (MOD) 24 hr  tablet, glipiZIDE (GLUCOTROL) 10 MG tablet  Paroxysmal atrial fibrillation (HCC)  High risk medication use - Plan: CBC, Comprehensive metabolic panel  Screening for colon cancer - Plan: Ambulatory referral to Gastroenterology  Will try again with 500 mg of metformin ER Will plan further follow- up pending labs. Plan for repeat A1c in 2 months  Referral to GI as her colonoscopy may be due- we do not have any readily available way of getting these records   Signed Lamar Blinks, MD

## 2015-07-02 NOTE — Patient Instructions (Signed)
Continue taking your glipizide 10 mg twice a day We are also going to add metformin 500 mg.   If you are NOT able to tolerate this medication please let us know Otherwise let's plan to check an A1c for you in about 2 months

## 2015-07-03 ENCOUNTER — Encounter: Payer: Self-pay | Admitting: Family Medicine

## 2015-07-08 NOTE — Telephone Encounter (Signed)
Attention: Dr. Lorelei Pont, They are going to refax the order for a knee brace, I asked him to put Attention: Dr. Lorelei Pont on the cover letter.

## 2015-07-09 ENCOUNTER — Other Ambulatory Visit: Payer: Self-pay

## 2015-07-09 MED ORDER — GLUCOSE BLOOD VI STRP: ORAL_STRIP | Status: DC

## 2015-07-09 MED ORDER — BLOOD GLUCOSE METER KIT: PACK | Status: DC

## 2015-07-11 ENCOUNTER — Encounter: Payer: Self-pay | Admitting: Family Medicine

## 2015-07-14 ENCOUNTER — Telehealth: Payer: Self-pay | Admitting: Family Medicine

## 2015-07-14 NOTE — Telephone Encounter (Signed)
Called her- she will come and see me this week or next to complete order for a knee brace- she needs to be measured and needs documentation in the chart

## 2015-07-16 ENCOUNTER — Encounter: Payer: Self-pay | Admitting: Family Medicine

## 2015-07-24 DIAGNOSIS — H5210 Myopia, unspecified eye: Secondary | ICD-10-CM | POA: Diagnosis not present

## 2015-07-24 DIAGNOSIS — H524 Presbyopia: Secondary | ICD-10-CM | POA: Diagnosis not present

## 2015-07-24 DIAGNOSIS — E119 Type 2 diabetes mellitus without complications: Secondary | ICD-10-CM | POA: Diagnosis not present

## 2015-07-24 LAB — HM DIABETES EYE EXAM

## 2015-08-06 ENCOUNTER — Encounter: Payer: Self-pay | Admitting: Family Medicine

## 2015-08-11 DIAGNOSIS — R6889 Other general symptoms and signs: Secondary | ICD-10-CM | POA: Diagnosis not present

## 2015-08-11 DIAGNOSIS — I10 Essential (primary) hypertension: Secondary | ICD-10-CM | POA: Diagnosis not present

## 2015-08-11 DIAGNOSIS — E119 Type 2 diabetes mellitus without complications: Secondary | ICD-10-CM | POA: Diagnosis not present

## 2015-08-11 DIAGNOSIS — Z1210 Encounter for screening for malignant neoplasm of intestinal tract, unspecified: Secondary | ICD-10-CM | POA: Diagnosis not present

## 2015-08-19 ENCOUNTER — Other Ambulatory Visit: Payer: Self-pay

## 2015-08-19 DIAGNOSIS — N898 Other specified noninflammatory disorders of vagina: Secondary | ICD-10-CM

## 2015-08-19 NOTE — Telephone Encounter (Signed)
Pharm reqs RF of terconazole cream. It looks like Drs Copland and Ouida Sills Rxd this for pt fairly regularly, but did not put RFs on it. Do you want to give RF? Pt has appt sch w/Dr Brigitte Pulse in March.

## 2015-08-21 NOTE — Telephone Encounter (Signed)
Pt needs to RTC.

## 2015-08-28 ENCOUNTER — Ambulatory Visit (INDEPENDENT_AMBULATORY_CARE_PROVIDER_SITE_OTHER): Payer: Commercial Managed Care - HMO | Admitting: Family Medicine

## 2015-08-28 ENCOUNTER — Ambulatory Visit: Payer: Self-pay | Admitting: Pharmacist

## 2015-08-28 VITALS — BP 145/73 | HR 65 | Temp 98.2°F | Resp 16 | Ht 63.0 in | Wt 195.0 lb

## 2015-08-28 DIAGNOSIS — M8580 Other specified disorders of bone density and structure, unspecified site: Secondary | ICD-10-CM

## 2015-08-28 DIAGNOSIS — E782 Mixed hyperlipidemia: Secondary | ICD-10-CM | POA: Diagnosis not present

## 2015-08-28 DIAGNOSIS — IMO0001 Reserved for inherently not codable concepts without codable children: Secondary | ICD-10-CM

## 2015-08-28 DIAGNOSIS — R3129 Other microscopic hematuria: Secondary | ICD-10-CM | POA: Diagnosis not present

## 2015-08-28 DIAGNOSIS — E1165 Type 2 diabetes mellitus with hyperglycemia: Secondary | ICD-10-CM | POA: Diagnosis not present

## 2015-08-28 DIAGNOSIS — R109 Unspecified abdominal pain: Secondary | ICD-10-CM | POA: Diagnosis not present

## 2015-08-28 DIAGNOSIS — I480 Paroxysmal atrial fibrillation: Secondary | ICD-10-CM

## 2015-08-28 DIAGNOSIS — I10 Essential (primary) hypertension: Secondary | ICD-10-CM

## 2015-08-28 DIAGNOSIS — E119 Type 2 diabetes mellitus without complications: Secondary | ICD-10-CM | POA: Diagnosis not present

## 2015-08-28 DIAGNOSIS — R7309 Other abnormal glucose: Secondary | ICD-10-CM | POA: Diagnosis not present

## 2015-08-28 LAB — POCT URINALYSIS DIP (MANUAL ENTRY)
GLUCOSE UA: NEGATIVE
Leukocytes, UA: NEGATIVE
Nitrite, UA: NEGATIVE
Protein Ur, POC: NEGATIVE
Spec Grav, UA: 1.025
Urobilinogen, UA: 0.2
pH, UA: 6

## 2015-08-28 LAB — CBC WITH DIFFERENTIAL/PLATELET
BASOS ABS: 0 10*3/uL (ref 0.0–0.1)
BASOS PCT: 0 % (ref 0–1)
EOS ABS: 0 10*3/uL (ref 0.0–0.7)
Eosinophils Relative: 1 % (ref 0–5)
HCT: 40.5 % (ref 36.0–46.0)
Hemoglobin: 13.2 g/dL (ref 12.0–15.0)
LYMPHS ABS: 2.1 10*3/uL (ref 0.7–4.0)
Lymphocytes Relative: 47 % — ABNORMAL HIGH (ref 12–46)
MCH: 27.3 pg (ref 26.0–34.0)
MCHC: 32.6 g/dL (ref 30.0–36.0)
MCV: 83.7 fL (ref 78.0–100.0)
MPV: 11.3 fL (ref 8.6–12.4)
Monocytes Absolute: 0.3 10*3/uL (ref 0.1–1.0)
Monocytes Relative: 6 % (ref 3–12)
NEUTROS PCT: 46 % (ref 43–77)
Neutro Abs: 2.1 10*3/uL (ref 1.7–7.7)
PLATELETS: 222 10*3/uL (ref 150–400)
RBC: 4.84 MIL/uL (ref 3.87–5.11)
RDW: 14.3 % (ref 11.5–15.5)
WBC: 4.5 10*3/uL (ref 4.0–10.5)

## 2015-08-28 LAB — COMPREHENSIVE METABOLIC PANEL
ALK PHOS: 60 U/L (ref 33–130)
ALT: 14 U/L (ref 6–29)
AST: 14 U/L (ref 10–35)
Albumin: 3.8 g/dL (ref 3.6–5.1)
BUN: 13 mg/dL (ref 7–25)
CALCIUM: 8.9 mg/dL (ref 8.6–10.4)
CO2: 24 mmol/L (ref 20–31)
Chloride: 102 mmol/L (ref 98–110)
Creat: 0.98 mg/dL — ABNORMAL HIGH (ref 0.60–0.93)
GLUCOSE: 242 mg/dL — AB (ref 65–99)
POTASSIUM: 3.9 mmol/L (ref 3.5–5.3)
Sodium: 141 mmol/L (ref 135–146)
Total Bilirubin: 0.4 mg/dL (ref 0.2–1.2)
Total Protein: 7.1 g/dL (ref 6.1–8.1)

## 2015-08-28 LAB — POC MICROSCOPIC URINALYSIS (UMFC): MUCUS RE: ABSENT

## 2015-08-28 LAB — POCT GLYCOSYLATED HEMOGLOBIN (HGB A1C): Hemoglobin A1C: 9.1

## 2015-08-28 MED ORDER — CLOTRIMAZOLE 2 % VA CREA: 1.0000 | TOPICAL_CREAM | Freq: Every day | VAGINAL | Status: DC

## 2015-08-28 MED ORDER — METFORMIN HCL ER (MOD) 500 MG PO TB24: 1000.0000 mg | ORAL_TABLET | Freq: Every day | ORAL | Status: DC

## 2015-08-28 NOTE — Progress Notes (Signed)
Subjective:    Patient ID: Catherine Joseph, female    DOB: 1940/10/03, 75 y.o.   MRN: [id number] Chief Complaint  Patient presents with  . Diabetes  . Hypertension  . Medication Refill     HPI She has been started on metformin in addition to the glipizide.   She denies any hypoglycemic episodes.  CBGs have decreased from 190s fasting in a.m. To 140s-150s fasting since starting metformin. Eating a lot of veggies - greens.  No n/v/d from metofrmin and is very compliant with her metformin.    She is c/o feeling very weak and would like a rx for vitamins. She had a cold and cough last week ansd doesn't know what she should take as far as DM.  She is not taking any vitamins ands drinks several glasses of milk a day.  Had Dr. Idolina Primer for eye exam last mo.  She used to have a pain in her right flanks  Past Medical History  Diagnosis Date  . Hypertension   . Diabetes mellitus type II   . Syncope   . Bradycardia   . Weakness   . Cataract   . Osteoporosis   . Anemia   . Chronic kidney disease    Past Surgical History  Procedure Laterality Date  . Eye surgery    . Cesarean section    . Abdominal hysterectomy     Current Outpatient Prescriptions on File Prior to Visit  Medication Sig Dispense Refill  . blood glucose meter kit and supplies Use to test blood sugar once daily. Dx: E11.65 1 each 0   No current facility-administered medications on file prior to visit.   Allergies  Allergen Reactions  . Codeine Other (See Comments)    vertigo   Family History  Problem Relation Age of Onset  . Diabetes Sister   . Hyperlipidemia Sister   . Hypertension Sister   . Diabetes Brother   . Hyperlipidemia Brother   . Hypertension Brother    Social History   Social History  . Marital Status: Widowed    Spouse Name: N/A  . Number of Children: N/A  . Years of Education: N/A   Social History Main Topics  . Smoking status: Never Smoker   . Smokeless tobacco: Not on file  .  Alcohol Use: No  . Drug Use: No  . Sexual Activity: Not on file   Other Topics Concern  . Not on file   Social History Narrative    Review of Systems  Constitutional: Positive for appetite change. Negative for fever, chills, activity change and unexpected weight change.  HENT: Positive for congestion, postnasal drip and rhinorrhea. Negative for sinus pressure, sore throat and trouble swallowing.   Eyes: Negative for visual disturbance.  Respiratory: Positive for cough. Negative for chest tightness, shortness of breath and wheezing.   Gastrointestinal: Negative for nausea, vomiting, abdominal pain, diarrhea, constipation and abdominal distention.  Genitourinary: Positive for flank pain. Negative for dysuria, urgency and decreased urine volume.  Neurological: Positive for weakness. Negative for tremors, syncope and numbness.       Objective:  BP 145/73 mmHg  Pulse 65  Temp(Src) 98.2 F (36.8 C)  Resp 16  Ht _0  (1.6 m)  Wt 195 lb (88.451 kg)  BMI 34.55 kg/m2  Physical Exam  Constitutional: She is oriented to person, place, and time. She appears well-developed and well-nourished. No distress.  HENT:  Head: Normocephalic and atraumatic.  Right Ear: External ear normal.  Left  Ear: External ear normal.  Eyes: Conjunctivae are normal. No scleral icterus.  Neck: Normal range of motion. Neck supple. No thyromegaly present.  Cardiovascular: Normal rate, regular rhythm, normal heart sounds and intact distal pulses.   Pulmonary/Chest: Effort normal and breath sounds normal. No respiratory distress.  Musculoskeletal: She exhibits no edema.  Lymphadenopathy:    She has no cervical adenopathy.  Neurological: She is alert and oriented to person, place, and time.  Skin: Skin is warm and dry. She is not diaphoretic. No erythema.  Psychiatric: She has a normal mood and affect. Her behavior is normal.          Assessment & Plan:  Requests 90d supplies of medication  1. Type 2  diabetes mellitus with hyperglycemia, without long-term current use of insulin (HCC) - cont glipizide 10 bid and increase metformin XR from 500 to 1000qd.  recheck in 3 mos  2. Essential hypertension   3. Microhematuria - still present today, on xarelta for p a. fib  4. Mixed hyperlipidemia   5. Right flank pain   6. Osteopenia - check vit D level  7. Uncontrolled type 2 diabetes mellitus without complication, without long-term current use of insulin (HCC)   Healht Maintanence: saw GI 2/20/217 - Dr. Benson Norway. H/o normal colonoscopies so no more needed  Orders Placed This Encounter  Procedures  . Comprehensive metabolic panel  . VITAMIN D 25 Hydroxy (Vit-D Deficiency, Fractures)  . Microalbumin/Creatinine Ratio, Urine  . CBC with Differential/Platelet  . POCT urinalysis dipstick  . POCT Microscopic Urinalysis ([facility])  . POCT glycosylated hemoglobin (Hb A1C)    Meds ordered this encounter  Medications  . clotrimazole (GYNE-LOTRIMIN 3) 2 % vaginal cream    Sig: Place 1 Applicatorful vaginally at bedtime.    Dispense:  21 g    Refill:  2  . DISCONTD: metFORMIN (GLUMETZA) 500 MG (MOD) 24 hr tablet    Sig: Take 2 tablets (1,000 mg total) by mouth daily with breakfast.    Dispense:  180 tablet    Refill:  1  . metFORMIN (GLUCOPHAGE-XR) 500 MG 24 hr tablet    Sig: Take 2 tablets (1,000 mg total) by mouth daily with breakfast.    Dispense:  180 tablet    Refill:  1     Delman Cheadle, MD MPH  Results for orders placed or performed in visit on 08/28/15  Comprehensive metabolic panel  Result Value Ref Range   Sodium 141 135 - 146 mmol/L   Potassium 3.9 3.5 - 5.3 mmol/L   Chloride 102 98 - 110 mmol/L   CO2 24 20 - 31 mmol/L   Glucose, Bld 242 (H) 65 - 99 mg/dL   BUN 13 7 - 25 mg/dL   Creat 0.98 (H) 0.60 - 0.93 mg/dL   Total Bilirubin 0.4 0.2 - 1.2 mg/dL   Alkaline Phosphatase 60 33 - 130 U/L   AST 14 10 - 35 U/L   ALT 14 6 - 29 U/L   Total Protein 7.1 6.1 - 8.1 g/dL   Albumin 3.8 3.6 -  5.1 g/dL   Calcium 8.9 8.6 - 10.4 mg/dL  VITAMIN D 25 Hydroxy (Vit-D Deficiency, Fractures)  Result Value Ref Range   Vit D, 25-Hydroxy 32 30 - 100 ng/mL  Microalbumin/Creatinine Ratio, Urine  Result Value Ref Range   Creatinine, Urine 362 (H) 20 - 320 mg/dL   Microalb, Ur 2.0 Not estab mg/dL   Microalb Creat Ratio 6 <30 mcg/mg creat  CBC with Differential/Platelet  Result Value Ref Range   WBC 4.5 4.0 - 10.5 K/uL   RBC 4.84 3.87 - 5.11 MIL/uL   Hemoglobin 13.2 12.0 - 15.0 g/dL   HCT 40.5 36.0 - 46.0 %   MCV 83.7 78.0 - 100.0 fL   MCH 27.3 26.0 - 34.0 pg   MCHC 32.6 30.0 - 36.0 g/dL   RDW 14.3 11.5 - 15.5 %   Platelets 222 150 - 400 K/uL   MPV 11.3 8.6 - 12.4 fL   Neutrophils Relative % 46 43 - 77 %   Neutro Abs 2.1 1.7 - 7.7 K/uL   Lymphocytes Relative 47 (H) 12 - 46 %   Lymphs Abs 2.1 0.7 - 4.0 K/uL   Monocytes Relative 6 3 - 12 %   Monocytes Absolute 0.3 0.1 - 1.0 K/uL   Eosinophils Relative 1 0 - 5 %   Eosinophils Absolute 0.0 0.0 - 0.7 K/uL   Basophils Relative 0 0 - 1 %   Basophils Absolute 0.0 0.0 - 0.1 K/uL   Smear Review Criteria for review not met   POCT urinalysis dipstick  Result Value Ref Range   Color, UA yellow yellow   Clarity, UA clear clear   Glucose, UA negative negative   Bilirubin, UA small (A) negative   Ketones, POC UA small (15) (A) negative   Spec Grav, UA 1.025    Blood, UA moderate (A) negative   pH, UA 6.0    Protein Ur, POC negative negative   Urobilinogen, UA 0.2    Nitrite, UA Negative Negative   Leukocytes, UA Negative Negative  POCT Microscopic Urinalysis ([facility])  Result Value Ref Range   WBC,UR,HPF,POC None None WBC/hpf   RBC,UR,HPF,POC Few (A) None RBC/hpf   Bacteria None None, Too numerous to count   Mucus Absent Absent   Epithelial Cells, UR Per Microscopy Few (A) None, Too numerous to count cells/hpf  POCT glycosylated hemoglobin (Hb A1C)  Result Value Ref Range   Hemoglobin A1C 9.1

## 2015-08-29 LAB — MICROALBUMIN / CREATININE URINE RATIO
CREATININE, URINE: 362 mg/dL — AB (ref 20–320)
Microalb Creat Ratio: 6 mcg/mg creat (ref <30)
Microalb, Ur: 2 mg/dL

## 2015-08-29 LAB — VITAMIN D 25 HYDROXY (VIT D DEFICIENCY, FRACTURES): Vit D, 25-Hydroxy: 32 ng/mL (ref 30–100)

## 2015-08-29 MED ORDER — METFORMIN HCL ER 500 MG PO TB24: 1000.0000 mg | ORAL_TABLET | Freq: Every day | ORAL | Status: DC

## 2015-09-02 ENCOUNTER — Encounter: Payer: Self-pay | Admitting: Family Medicine

## 2015-09-08 ENCOUNTER — Other Ambulatory Visit: Payer: Self-pay

## 2015-09-08 DIAGNOSIS — I10 Essential (primary) hypertension: Secondary | ICD-10-CM

## 2015-09-08 DIAGNOSIS — E785 Hyperlipidemia, unspecified: Secondary | ICD-10-CM

## 2015-09-08 DIAGNOSIS — IMO0001 Reserved for inherently not codable concepts without codable children: Secondary | ICD-10-CM

## 2015-09-08 DIAGNOSIS — E1165 Type 2 diabetes mellitus with hyperglycemia: Secondary | ICD-10-CM

## 2015-09-08 MED ORDER — SIMVASTATIN 20 MG PO TABS: 20.0000 mg | ORAL_TABLET | Freq: Every day | ORAL | Status: DC

## 2015-09-08 MED ORDER — VALSARTAN-HYDROCHLOROTHIAZIDE 160-25 MG PO TABS: 1.0000 | ORAL_TABLET | Freq: Every day | ORAL | Status: DC

## 2015-09-08 MED ORDER — GLIPIZIDE 10 MG PO TABS: 10.0000 mg | ORAL_TABLET | Freq: Two times a day (BID) | ORAL | Status: DC

## 2015-09-08 MED ORDER — ALCOHOL PREP PADS: MEDICATED_PAD | Status: DC

## 2015-09-08 MED ORDER — SURECHEK CONTROL SOLUTION NORMAL VI LIQD: Status: AC

## 2015-09-09 ENCOUNTER — Other Ambulatory Visit: Payer: Self-pay | Admitting: *Deleted

## 2015-09-09 ENCOUNTER — Other Ambulatory Visit: Payer: Self-pay

## 2015-09-09 DIAGNOSIS — I480 Paroxysmal atrial fibrillation: Secondary | ICD-10-CM

## 2015-09-09 MED ORDER — TRUEPLUS LANCETS 28G MISC: Status: DC

## 2015-09-09 MED ORDER — FLECAINIDE ACETATE 100 MG PO TABS: 50.0000 mg | ORAL_TABLET | Freq: Two times a day (BID) | ORAL | Status: DC

## 2015-09-09 MED ORDER — TRUE METRIX AIR GLUCOSE METER DEVI: 1.0000 | Freq: Every day | Status: DC

## 2015-09-09 MED ORDER — GLUCOSE BLOOD VI STRP: ORAL_STRIP | Status: DC

## 2015-09-12 ENCOUNTER — Telehealth: Payer: Self-pay | Admitting: *Deleted

## 2015-09-12 ENCOUNTER — Other Ambulatory Visit: Payer: Self-pay | Admitting: *Deleted

## 2015-09-12 MED ORDER — RIVAROXABAN 20 MG PO TABS: 20.0000 mg | ORAL_TABLET | Freq: Every day | ORAL | Status: DC

## 2015-09-12 NOTE — Telephone Encounter (Signed)
XARELTO SAMPLES UP FRONT, 3 BOTTLES

## 2015-10-16 IMAGING — CT CT ABD-PELV W/ CM
1 of 3 series · 13 of 32 positions shown, 18 images · IV contrast (OMNIPAQUE 300)
Comparison: 03/27/2014

CLINICAL DATA: Progressive lower pelvic pain with nausea for 2
weeks. Fever.

EXAM:
CT ABDOMEN AND PELVIS WITH CONTRAST
TECHNIQUE: Multidetector CT imaging of the abdomen and pelvis was performed
using the standard protocol following bolus administration of
intravenous contrast.
CONTRAST:  100mL OMNIPAQUE IOHEXOL 300 MG/ML SOLN, 50mL OMNIPAQUE
IOHEXOL 300 MG/ML SOLN

[Series 2: abd/pel with · axial · 0.72mm/px · z∈[-728,-333]mm · 13 of 89 slices shown, 18 images]
[im 5/89  soft-tissue]
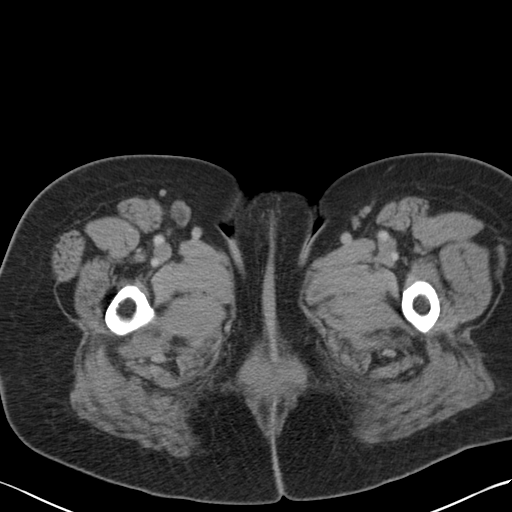
[im 5/89  bone]
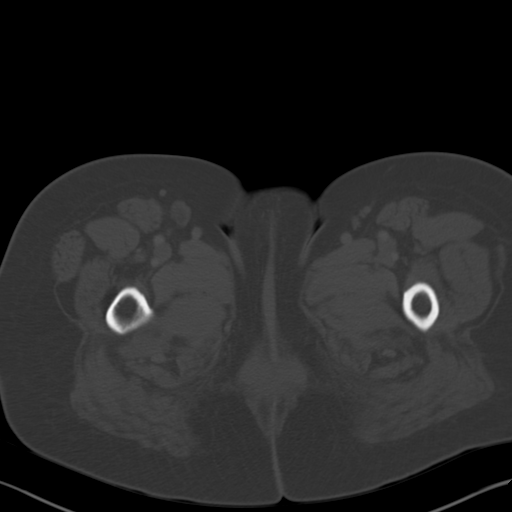
[im 14/89  soft-tissue]
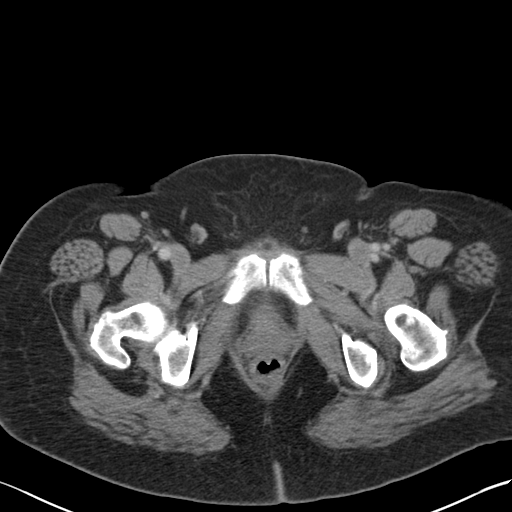
[im 19/89  soft-tissue]
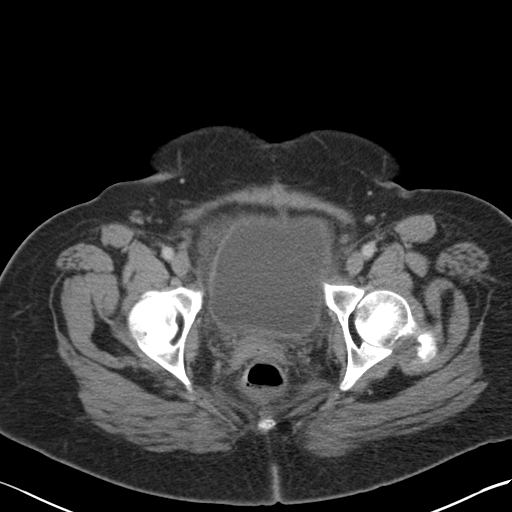
[im 28/89  soft-tissue]
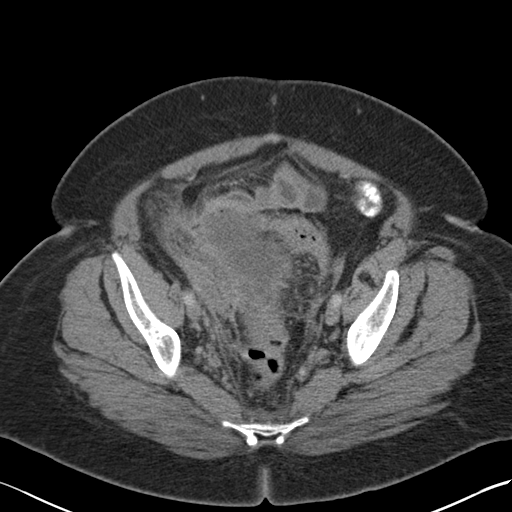
[im 33/89  soft-tissue]
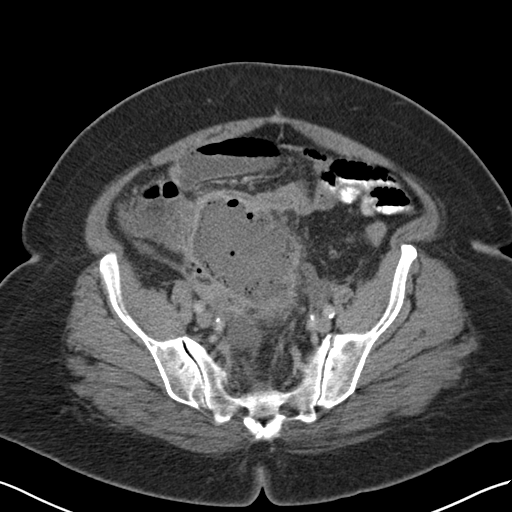
[im 42/89  soft-tissue]
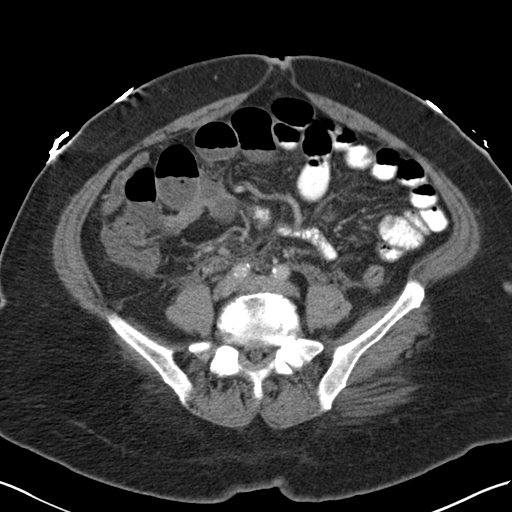
[im 47/89  soft-tissue]
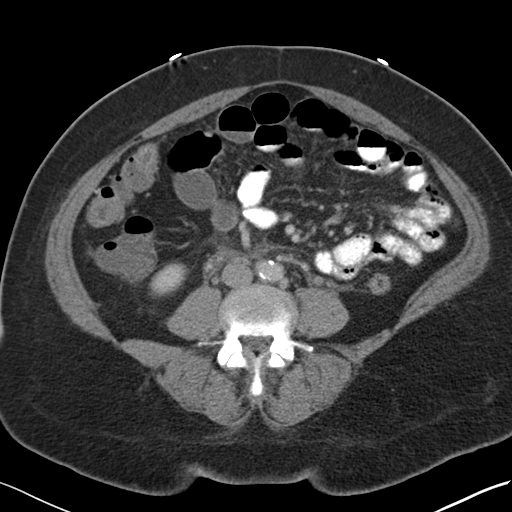
[im 56/89  soft-tissue]
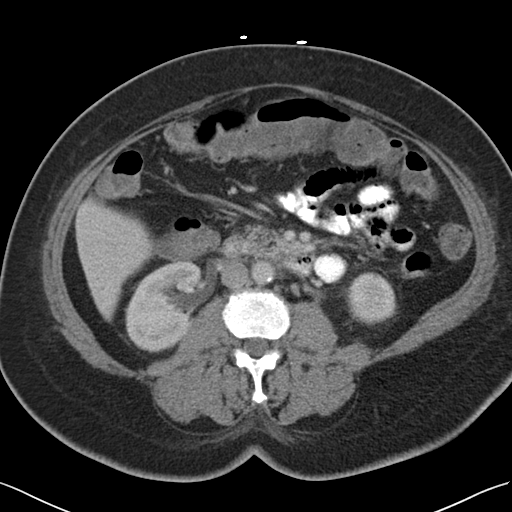
[im 61/89  soft-tissue]
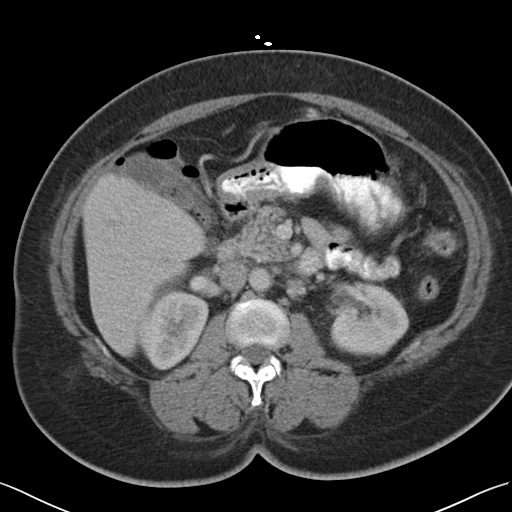
[im 61/89  bone]
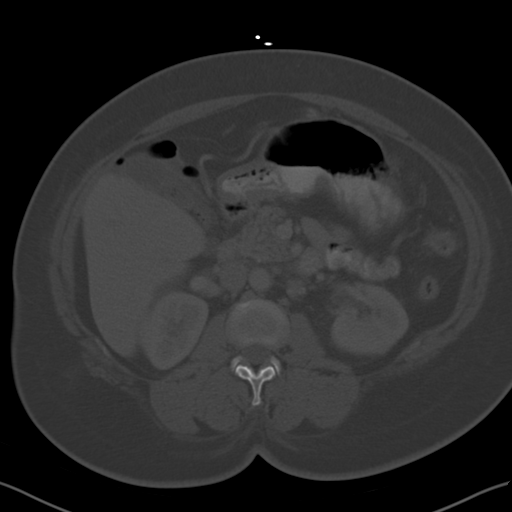
[im 70/89  soft-tissue]
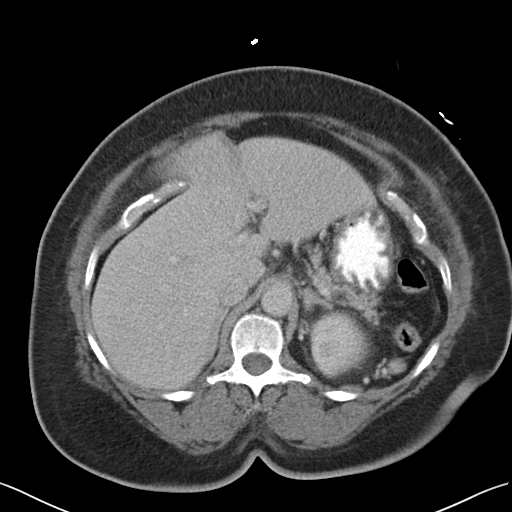
[im 70/89  lung]
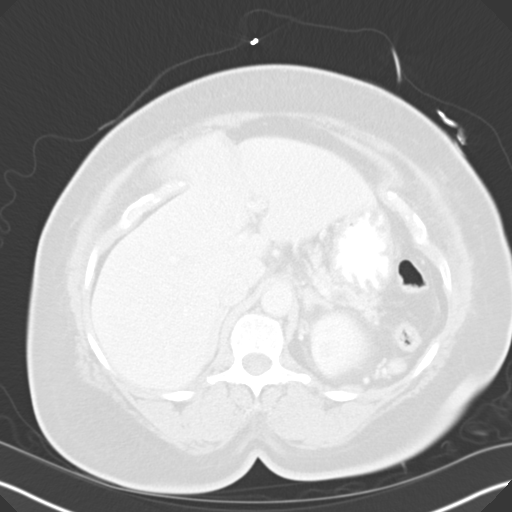
[im 75/89  soft-tissue]
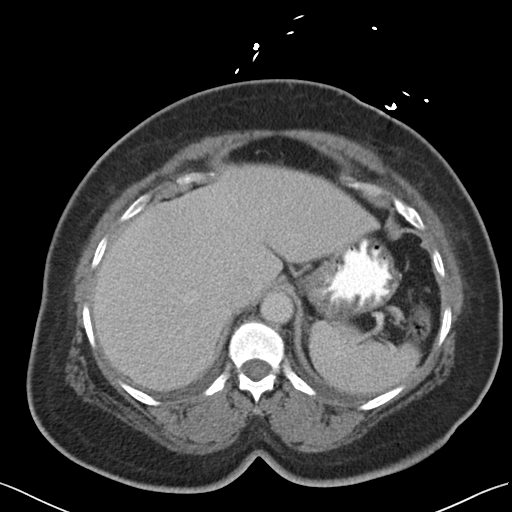
[im 75/89  lung]
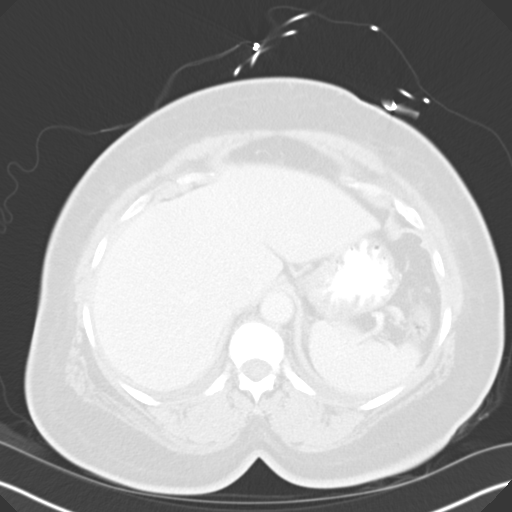
[im 79/89  lung]
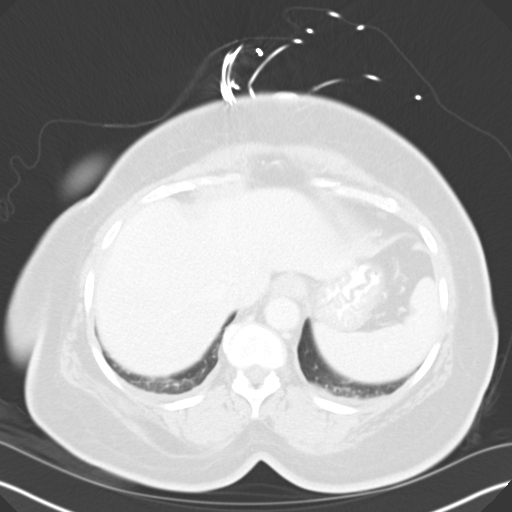
[im 84/89  soft-tissue]
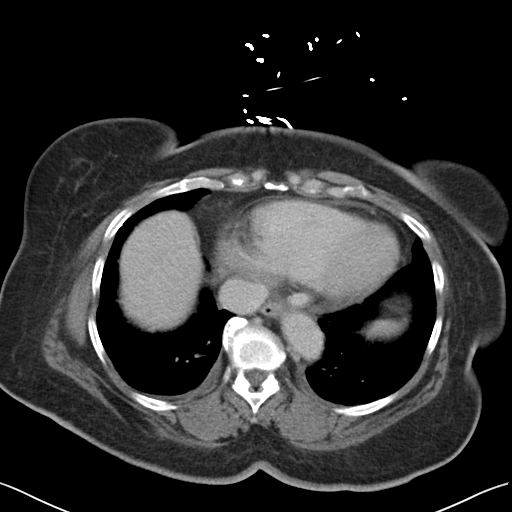
[im 84/89  lung]
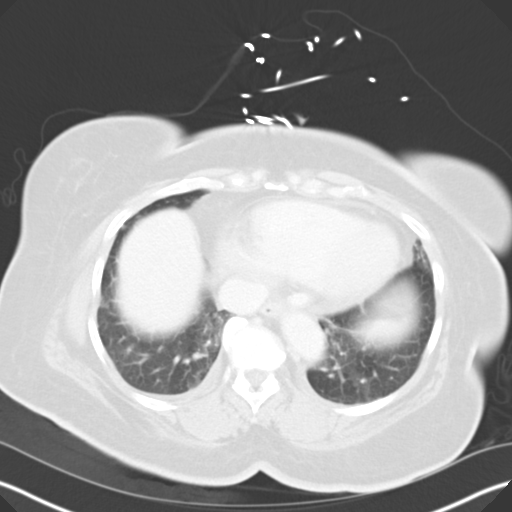

[13 of 32 positions shown; findings below may reference images not displayed]

FINDINGS: Patchy airspace disease in the lung bases suggesting infiltration or
edema. Minimal bilateral pleural effusions.

The liver, spleen, gallbladder, pancreas, adrenal glands, kidneys,
abdominal aorta, and inferior vena cava are unremarkable. There is
inflammatory stranding in the retroperitoneum. Retroperitoneal lymph
nodes are not pathologically enlarged and are likely reactive. Small
accessory spleen. Portal veins appear patent. There are varices
demonstrated in the left upper quadrant. Stomach, small bowel, and
colon are not abnormally distended. Small umbilical hernia
containing fat.

Pelvis: There is a large abscess in the anterior mid pelvis oriented
slightly towards the right. The abscess measures 6.5 x 10.1 by
cm. There is extensive inflammatory stranding throughout the pelvis
and extending up into the retroperitoneum. Smaller adjacent
loculated fluid collections are present in the periphery of the main
abscess. There is inflammatory stranding and thickening involving
the sigmoid colon. Likely this represents locally perforated
diverticulitis with diverticular abscess. No free intraperitoneal
air is noted. Small amount of free fluid in the pelvis is likely
reactive. Uterus appears surgically absent. No abnormal adnexal
masses. Appendix is not identified. Degenerative changes in the
lumbar spine. No destructive bone lesions appreciated.
IMPRESSION: Large pelvic abscess with associated inflammatory stranding
throughout the pelvis and extending into the right lower quadrant
and retroperitoneum. Small amount of free fluid in the pelvis. No
free intraperitoneal air. This likely results from diverticulitis
with local perforation and abscess formation. Appendix is not
identified and abnormal appendix is not excluded.

## 2015-10-23 IMAGING — CT CT PELVIS W/O CM
1 series · 15 of 32 positions shown, 19 images · non-contrast
Comparison: None.

CLINICAL DATA: Right lower quadrant inflammatory process. CT scan
performed this morning demonstrated and equivocal fluid collection
in the right lower quadrant adjacent to the existing drain. This
follow-up study with oral contrast is being performed 12 IIA weight
that fluid collection.

EXAM:
CT PELVIS WITHOUT CONTRAST
TECHNIQUE: Multidetector CT imaging of the pelvis was performed following the
standard protocol without intravenous contrast.

[Series 2: rtn ap without · axial · non-contrast · 0.84mm/px · z∈[-250,-70]mm · 15 of 41 slices shown, 19 images]
[im 3/41  soft-tissue]
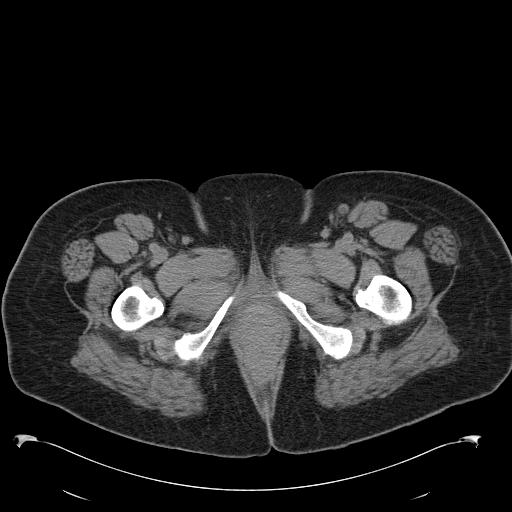
[im 3/41  bone]
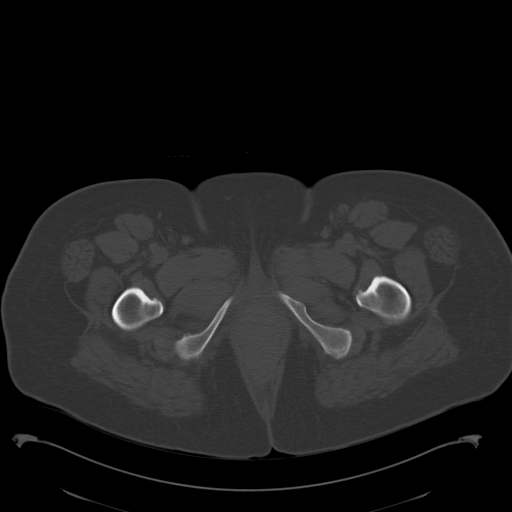
[im 6/41  soft-tissue]
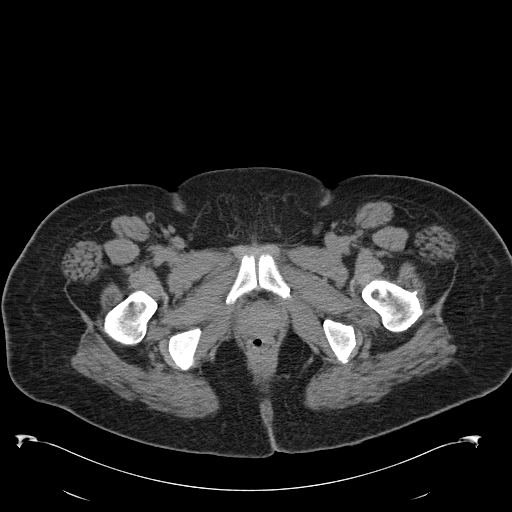
[im 8/41  soft-tissue]
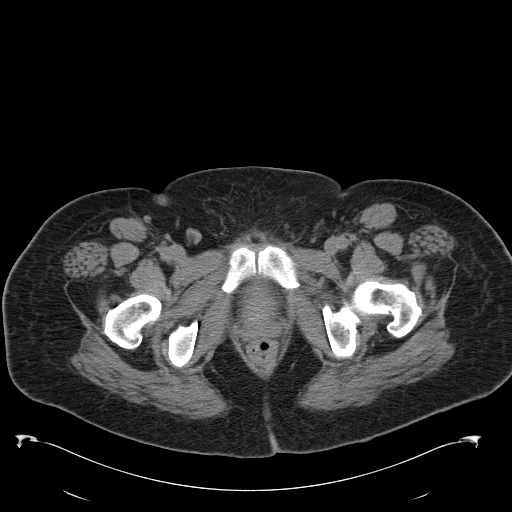
[im 12/41  soft-tissue]
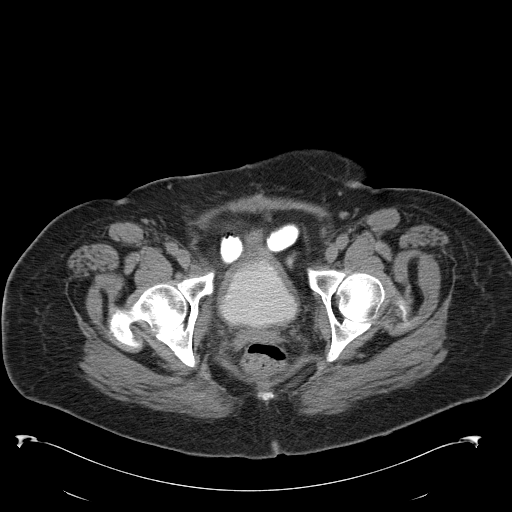
[im 15/41  soft-tissue]
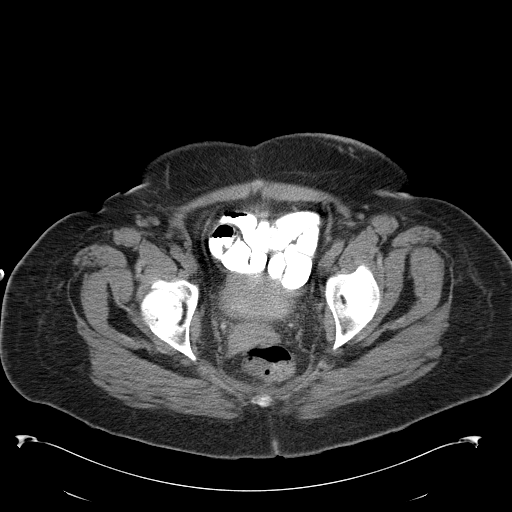
[im 17/41  soft-tissue]
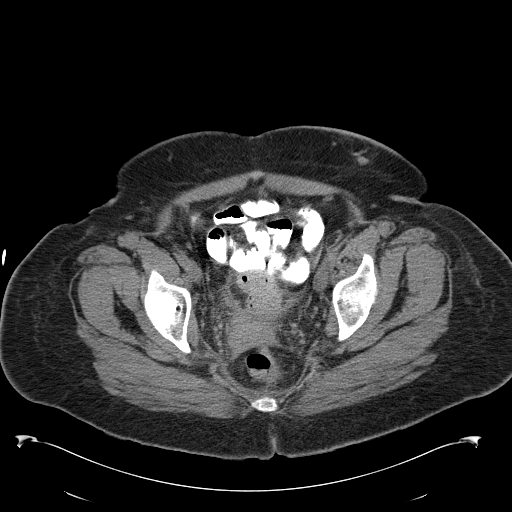
[im 21/41  soft-tissue]
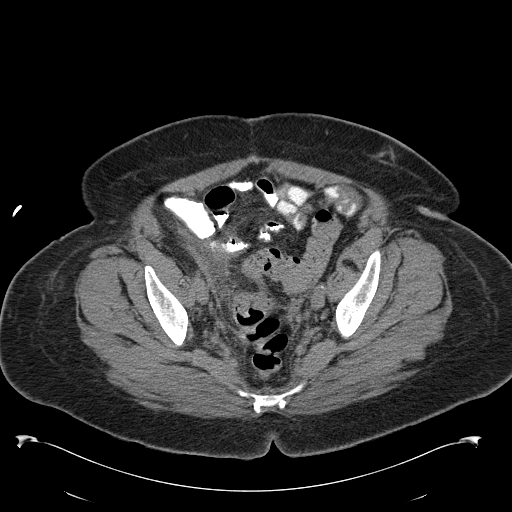
[im 24/41  soft-tissue]
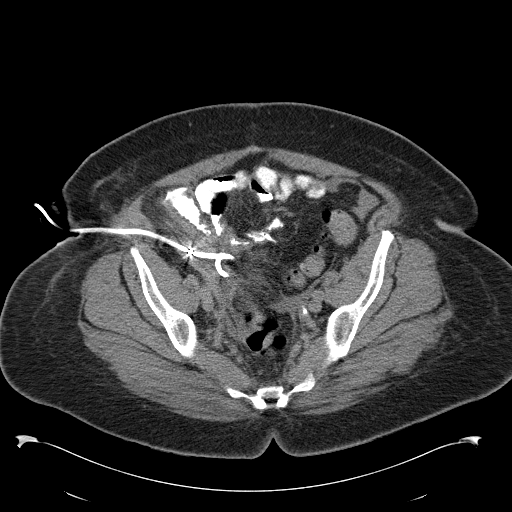
[im 26/41  soft-tissue]
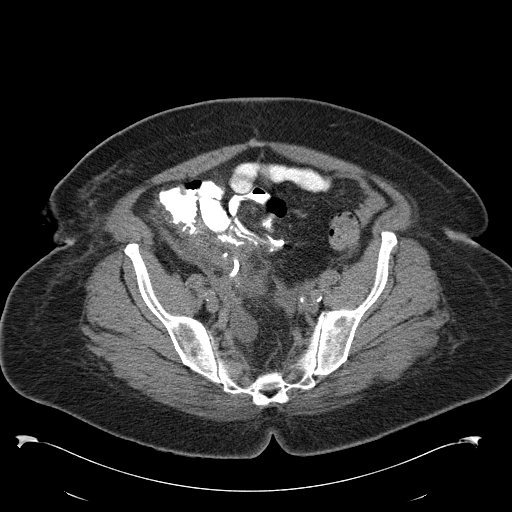
[im 26/41  bone]
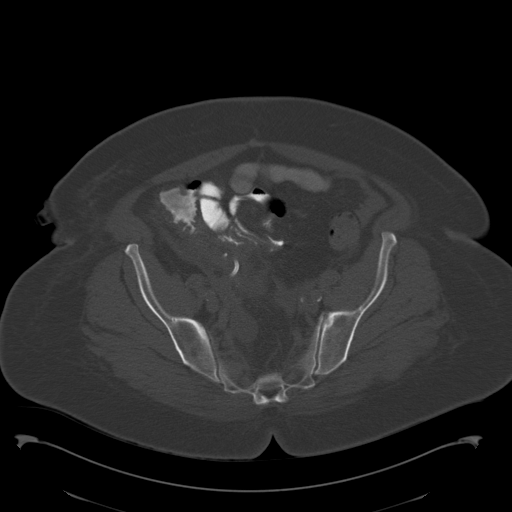
[im 29/41  soft-tissue]
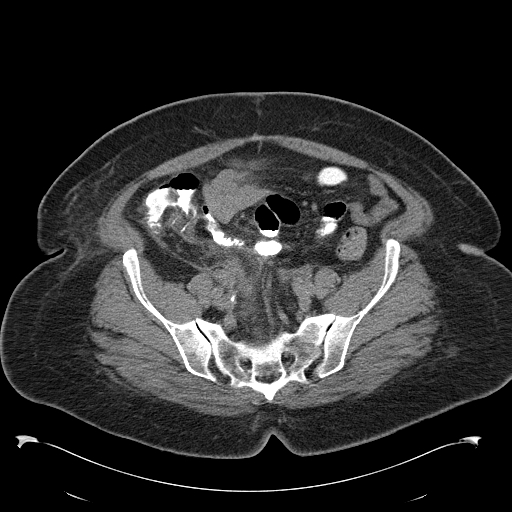
[im 33/41  soft-tissue]
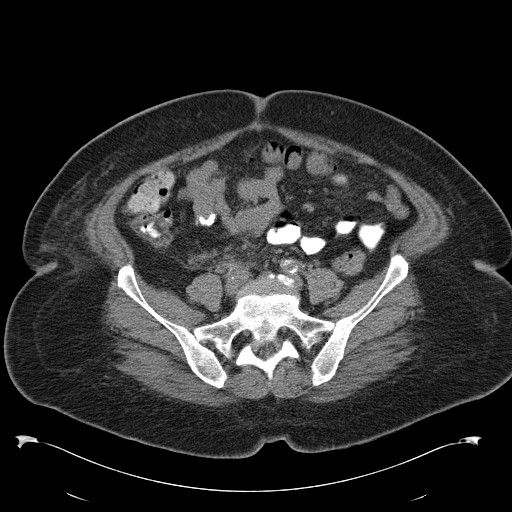
[im 35/41  soft-tissue]
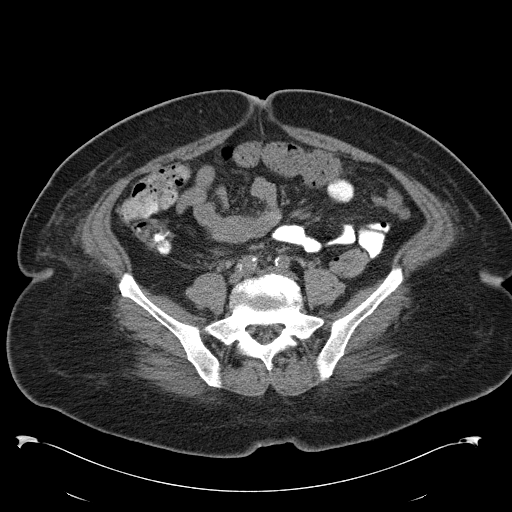
[im 35/41  lung]
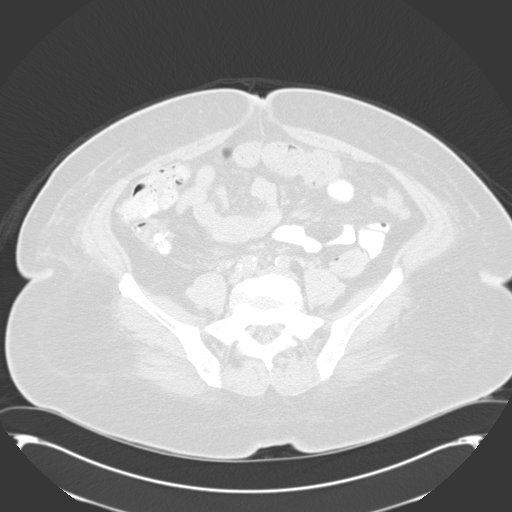
[im 37/41  lung]
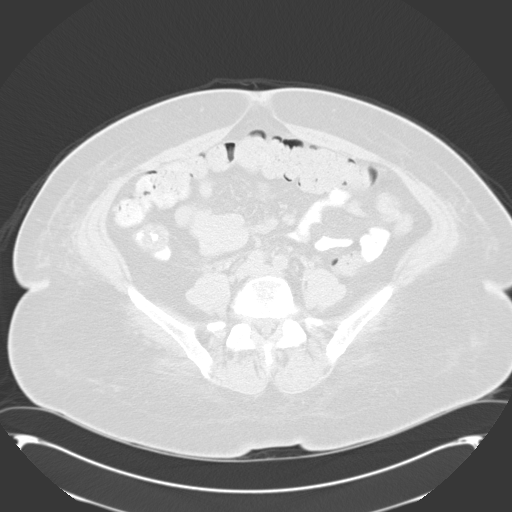
[im 38/41  soft-tissue]
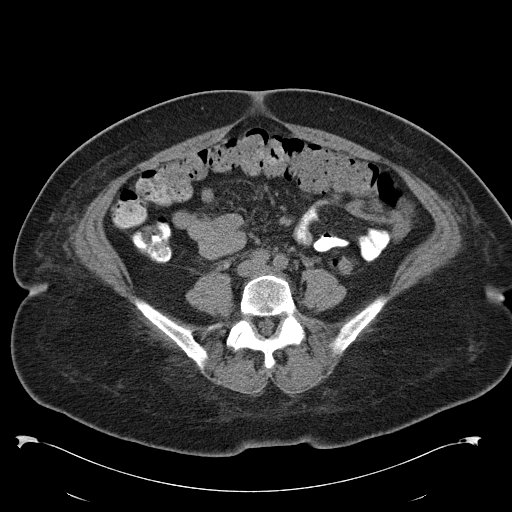
[im 38/41  lung]
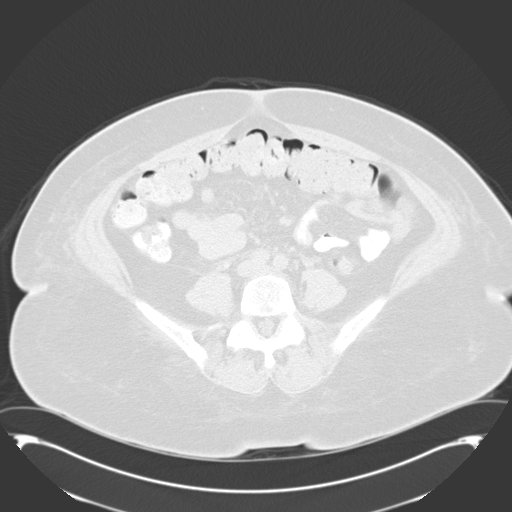
[im 39/41  lung]
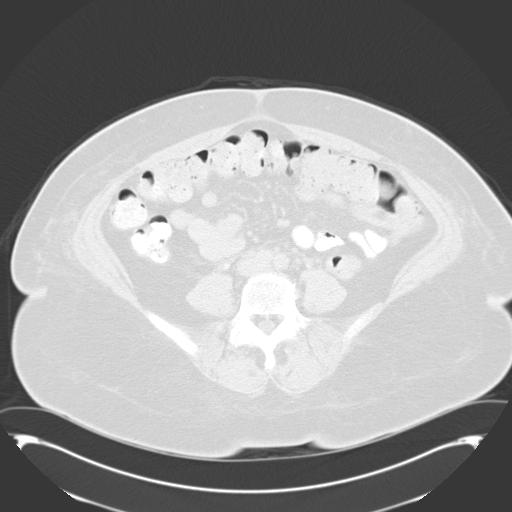

[15 of 32 positions shown; findings below may reference images not displayed]

FINDINGS: The fluid collection in question is now filled with oral contrast
and is consistent with a dilated cecum.

The abscess surrounding the drain has completely decompressed. There
is stranding within the retroperitoneal fat surrounding the abscess
compatible with inflammatory changes. A calculus is present within
the loop of the abscess strain worrisome for an appendicolith.

No new abscess. No free-fluid. Adnexa are unremarkable. Uterus is
absent.
IMPRESSION: The right lower quadrant indeterminate full collection is now filled
with oral contrast and is consistent with the cecum.

Abscess strain remains in place. Abscess has resolved. No new
abscess. Inflammatory changes persist. Appendicolith is suspected.
Underlying appendicitis may be persistent.

## 2015-11-04 ENCOUNTER — Telehealth: Payer: Self-pay

## 2015-11-04 NOTE — Telephone Encounter (Signed)
Pt is needing a refill on metphromin thru [employer] [facility]

## 2015-11-06 ENCOUNTER — Telehealth: Payer: Self-pay | Admitting: Cardiovascular Disease

## 2015-11-06 MED ORDER — METFORMIN HCL ER 500 MG PO TB24: 1000.0000 mg | ORAL_TABLET | Freq: Every day | ORAL | Status: DC

## 2015-11-06 NOTE — Telephone Encounter (Signed)
Ok sent!

## 2015-11-06 NOTE — Telephone Encounter (Signed)
Giving patient samples to keep her from skipping any doses of her xarelto. Will leave at front desk for patient to pick up. Patient verbalized understanding.

## 2015-11-06 NOTE — Telephone Encounter (Signed)
New Message :  Pt is calling in wanting to speak with you about her Xarelto. She says that she just reordered some and will not be able to get it for a few days. She wanted to know if she will be ok going a few days without this. Please f/u with her.

## 2015-11-27 ENCOUNTER — Ambulatory Visit (INDEPENDENT_AMBULATORY_CARE_PROVIDER_SITE_OTHER): Payer: Commercial Managed Care - HMO | Admitting: Family Medicine

## 2015-11-27 ENCOUNTER — Encounter: Payer: Self-pay | Admitting: Family Medicine

## 2015-11-27 VITALS — BP 124/80 | HR 60 | Temp 97.8°F | Resp 16 | Ht 63.0 in | Wt 194.0 lb

## 2015-11-27 DIAGNOSIS — D638 Anemia in other chronic diseases classified elsewhere: Secondary | ICD-10-CM | POA: Diagnosis not present

## 2015-11-27 DIAGNOSIS — R3129 Other microscopic hematuria: Secondary | ICD-10-CM | POA: Diagnosis not present

## 2015-11-27 DIAGNOSIS — E782 Mixed hyperlipidemia: Secondary | ICD-10-CM | POA: Diagnosis not present

## 2015-11-27 DIAGNOSIS — I10 Essential (primary) hypertension: Secondary | ICD-10-CM

## 2015-11-27 DIAGNOSIS — I480 Paroxysmal atrial fibrillation: Secondary | ICD-10-CM | POA: Diagnosis not present

## 2015-11-27 DIAGNOSIS — E1165 Type 2 diabetes mellitus with hyperglycemia: Secondary | ICD-10-CM | POA: Diagnosis not present

## 2015-11-27 DIAGNOSIS — IMO0001 Reserved for inherently not codable concepts without codable children: Secondary | ICD-10-CM

## 2015-11-27 LAB — POCT URINALYSIS DIP (MANUAL ENTRY)
BILIRUBIN UA: NEGATIVE
Bilirubin, UA: NEGATIVE
Glucose, UA: NEGATIVE
Leukocytes, UA: NEGATIVE
Nitrite, UA: NEGATIVE
PH UA: 7.5
PROTEIN UA: NEGATIVE
SPEC GRAV UA: 1.015
UROBILINOGEN UA: 0.2

## 2015-11-27 LAB — POC MICROSCOPIC URINALYSIS (UMFC): Mucus: ABSENT

## 2015-11-27 LAB — GLUCOSE, POCT (MANUAL RESULT ENTRY): POC Glucose: 128 mg/dl — AB (ref 70–99)

## 2015-11-27 LAB — POCT GLYCOSYLATED HEMOGLOBIN (HGB A1C): HEMOGLOBIN A1C: 7.1

## 2015-11-27 MED ORDER — METFORMIN HCL ER 500 MG PO TB24: 1000.0000 mg | ORAL_TABLET | Freq: Every day | ORAL | Status: DC

## 2015-11-27 MED ORDER — GLIPIZIDE 10 MG PO TABS: 10.0000 mg | ORAL_TABLET | Freq: Two times a day (BID) | ORAL | Status: DC

## 2015-11-27 NOTE — Patient Instructions (Addendum)
IF you received an x-ray today, you will receive an invoice from [facility]. Please contact [facility] at [phone] with questions or concerns regarding your invoice.   IF you received labwork today, you will receive an invoice from [employer]. Please contact [facility] at [phone] with questions or concerns regarding your invoice.   Our billing staff will not be able to assist you with questions regarding bills from these companies.  You will be contacted with the lab results as soon as they are available. The fastest way to get your results is to activate your My Chart account. Instructions are located on the last page of this paperwork. If you have not heard from [place] regarding the results in 2 weeks, please contact this office.   Diabetes and Standards of Medical Care Diabetes is complicated. You may find that your diabetes team includes a dietitian, nurse, diabetes educator, eye doctor, and more. To help everyone know what is going on and to help you get the care you deserve, the following schedule of care was developed to help keep you on track. Below are the tests, exams, vaccines, medicines, education, and plans you will need. HbA1c test This test shows how well you have controlled your glucose over the past 2-3 months. It is used to see if your diabetes management plan needs to be adjusted.   It is performed at least 2 times a year if you are meeting treatment goals.  It is performed 4 times a year if therapy has changed or if you are not meeting treatment goals. Blood pressure test  This test is performed at every routine medical visit. The goal is less than 140/90 mm Hg for most people, but 130/80 mm Hg in some cases. Ask your health care provider about your goal. Dental exam  Follow up with the dentist regularly. Eye exam  If you are diagnosed with type 1 diabetes as a child, get an exam upon reaching the age of 28 years or  older and having had diabetes for 3-5 years. Yearly eye exams are recommended after that initial eye exam.  If you are diagnosed with type 1 diabetes as an adult, get an exam within 5 years of diagnosis and then yearly.  If you are diagnosed with type 2 diabetes, get an exam as soon as possible after the diagnosis and then yearly. Foot care exam  Visual foot exams are performed at every routine medical visit. The exams check for cuts, injuries, or other problems with the feet.  You should have a complete foot exam performed every year. This exam includes an inspection of the structure and skin of your feet, a check of the pulses in your feet, and a check of the sensation in your feet.  Type 1 diabetes: The first exam is performed 5 years after diagnosis.  Type 2 diabetes: The first exam is performed at the time of diagnosis.  Check your feet nightly for cuts, injuries, or other problems with your feet. Tell your health care provider if anything is not healing. Kidney function test (urine microalbumin)  This test is performed once a year.  Type 1 diabetes: The first test is performed 5 years after diagnosis.  Type 2 diabetes: The first test is performed at the time of diagnosis.  A serum creatinine and estimated glomerular filtration rate (eGFR) test is done once a year to assess the level of chronic kidney disease (CKD), if present. Lipid profile (cholesterol, HDL, LDL,  triglycerides)  Performed every 5 years for most people.  The goal for LDL is less than 100 mg/dL. If you are at high risk, the goal is less than 70 mg/dL.  The goal for HDL is 40 mg/dL-50 mg/dL for men and 50 mg/dL-60 mg/dL for women. An HDL cholesterol of 60 mg/dL or higher gives some protection against heart disease.  The goal for triglycerides is less than 150 mg/dL. Immunizations  The flu (influenza) vaccine is recommended yearly for every person 47 months of age or older who has diabetes.  The pneumonia  (pneumococcal) vaccine is recommended for every person 88 years of age or older who has diabetes. Adults 15 years of age or older may receive the pneumonia vaccine as a series of two separate shots.  The hepatitis B vaccine is recommended for adults shortly after they have been diagnosed with diabetes.  The Tdap (tetanus, diphtheria, and pertussis) vaccine should be given:  According to normal childhood vaccination schedules, for children.  Every 10 years, for adults who have diabetes. Diabetes self-management education  Education is recommended at diagnosis and ongoing as needed. Treatment plan  Your treatment plan is reviewed at every medical visit.   This information is not intended to replace advice given to you by your health care provider. Make sure you discuss any questions you have with your health care provider.   Document Released: 04/04/2009 Document Revised: 06/28/2014 Document Reviewed: 11/07/2012 Elsevier Interactive Patient Education 2016 [place].  Diabetes and Foot Care Diabetes may cause you to have problems because of poor blood supply (circulation) to your feet and legs. This may cause the skin on your feet to become thinner, break easier, and heal more slowly. Your skin may become dry, and the skin may peel and crack. You may also have nerve damage in your legs and feet causing decreased feeling in them. You may not notice minor injuries to your feet that could lead to infections or more serious problems. Taking care of your feet is one of the most important things you can do for yourself.  HOME CARE INSTRUCTIONS  Wear shoes at all times, even in the house. Do not go barefoot. Bare feet are easily injured.  Check your feet daily for blisters, cuts, and redness. If you cannot see the bottom of your feet, use a mirror or ask someone for help.  Wash your feet with warm water (do not use hot water) and mild soap. Then pat your feet and the areas between your toes  until they are completely dry. Do not soak your feet as this can dry your skin.  Apply a moisturizing lotion or petroleum jelly (that does not contain alcohol and is unscented) to the skin on your feet and to dry, brittle toenails. Do not apply lotion between your toes.  Trim your toenails straight across. Do not dig under them or around the cuticle. File the edges of your nails with an emery board or nail file.  Do not cut corns or calluses or try to remove them with medicine.  Wear clean socks or stockings every day. Make sure they are not too tight. Do not wear knee-high stockings since they may decrease blood flow to your legs.  Wear shoes that fit properly and have enough cushioning. To break in new shoes, wear them for just a few hours a day. This prevents you from injuring your feet. Always look in your shoes before you put them on to be sure there are no  objects inside.  Do not cross your legs. This may decrease the blood flow to your feet.  If you find a minor scrape, cut, or break in the skin on your feet, keep it and the skin around it clean and dry. These areas may be cleansed with mild soap and water. Do not cleanse the area with peroxide, alcohol, or iodine.  When you remove an adhesive bandage, be sure not to damage the skin around it.  If you have a wound, look at it several times a day to make sure it is healing.  Do not use heating pads or hot water bottles. They may burn your skin. If you have lost feeling in your feet or legs, you may not know it is happening until it is too late.  Make sure your health care provider performs a complete foot exam at least annually or more often if you have foot problems. Report any cuts, sores, or bruises to your health care provider immediately. SEEK MEDICAL CARE IF:   You have an injury that is not healing.  You have cuts or breaks in the skin.  You have an ingrown nail.  You notice redness on your legs or feet.  You feel  burning or tingling in your legs or feet.  You have pain or cramps in your legs and feet.  Your legs or feet are numb.  Your feet always feel cold. SEEK IMMEDIATE MEDICAL CARE IF:   There is increasing redness, swelling, or pain in or around a wound.  There is a red line that goes up your leg.  Pus is coming from a wound.  You develop a fever or as directed by your health care provider.  You notice a bad smell coming from an ulcer or wound.   This information is not intended to replace advice given to you by your health care provider. Make sure you discuss any questions you have with your health care provider.   Document Released: 06/04/2000 Document Revised: 02/07/2013 Document Reviewed: 11/14/2012 Elsevier Interactive Patient Education [employer].

## 2015-11-27 NOTE — Progress Notes (Signed)
Subjective:    Patient ID: Catherine Joseph, female    DOB: 24-May-1941, 75 y.o.   MRN: [id number] Chief Complaint  Patient presents with  . Diabetes    follow up  . Hypertension    follow up  . lump on left foot    months    HPI [place]  DMII: On metformin and glipizide was having fasting cbgs still 140s-150s.  See Dr. Idolina Primer for reg eye exam. Nml microalb 08/2015. Her a1c was 9. 1 so we increased her metformin XR from 500 to 1000qd, cont on glipizide. A.m. Fating cbgs 106-130.   Did have moderate blood on last UA but was very dehydrated but is on xaralto for a. fib   Reports she has vertigo upon waking in the morning which she has attributed to her med - metformin or xarelto. During the day it goes away and doesn't recur if she naps.   Past Medical History  Diagnosis Date  . Hypertension   . Diabetes mellitus type II   . Syncope   . Bradycardia   . Weakness   . Cataract   . Osteoporosis   . Anemia   . Chronic kidney disease    Past Surgical History  Procedure Laterality Date  . Eye surgery    . Cesarean section    . Abdominal hysterectomy     Current Outpatient Prescriptions on File Prior to Visit  Medication Sig Dispense Refill  . Alcohol Swabs (ALCOHOL PREP) PADS Use to check blood sugar daily. Dx code: 11.65 100 each 3  . Blood Glucose Calibration (SURECHEK CONTROL SOLUTION) Normal LIQD Use to check blood sugar daily. Dx E11.65 1 each 3  . blood glucose meter kit and supplies Use to test blood sugar once daily. Dx: E11.65 1 each 0  . Blood Glucose Monitoring Suppl (TRUE METRIX AIR GLUCOSE METER) DEVI 1 each by Does not apply route daily. Use to test blood sugar once daily. Dx: E11.65 1 Device 0  . clotrimazole (GYNE-LOTRIMIN 3) 2 % vaginal cream Place 1 Applicatorful vaginally at bedtime. 21 g 2  . flecainide (TAMBOCOR) 100 MG tablet Take 0.5 tablets (50 mg total) by mouth 2 (two) times daily. 90 tablet 2  . glucose blood (TRUE METRIX BLOOD GLUCOSE TEST) test  strip Use to test blood sugar once daily. Dx: E11.65 100 each 3  . rivaroxaban (XARELTO) 20 MG TABS tablet Take 1 tablet (20 mg total) by mouth daily with supper. 90 tablet 2  . simvastatin (ZOCOR) 20 MG tablet Take 1 tablet (20 mg total) by mouth daily. 90 tablet 1  . TRUEPLUS LANCETS 28G MISC Use to test blood sugar once daily. Dx: E11.65 100 each 3  . valsartan-hydrochlorothiazide (DIOVAN-HCT) 160-25 MG tablet Take 1 tablet by mouth daily. 90 tablet 1   No current facility-administered medications on file prior to visit.   Allergies  Allergen Reactions  . Codeine Other (See Comments)    vertigo   Family History  Problem Relation Age of Onset  . Diabetes Sister   . Hyperlipidemia Sister   . Hypertension Sister   . Diabetes Brother   . Hyperlipidemia Brother   . Hypertension Brother    Social History   Social History  . Marital Status: Widowed    Spouse Name: N/A  . Number of Children: N/A  . Years of Education: N/A   Social History Main Topics  . Smoking status: Never Smoker   . Smokeless tobacco: None  . Alcohol Use:  No  . Drug Use: No  . Sexual Activity: Not Asked   Other Topics Concern  . None   Social History Narrative    Review of Systems See HPI    Objective:  BP 140/80 mmHg  Pulse 60  Temp(Src) 97.8 F (36.6 C) (Oral)  Resp 16  Ht '5\' 3"'$  (1.6 m)  Wt 194 lb (87.998 kg)  BMI 34.37 kg/m2  Physical Exam  Constitutional: She is oriented to person, place, and time. She appears well-developed and well-nourished. No distress.  HENT:  Head: Normocephalic and atraumatic.  Right Ear: External ear normal.  Left Ear: External ear normal.  Eyes: Conjunctivae are normal. No scleral icterus.  Neck: Normal range of motion. Neck supple. No thyromegaly present.  Cardiovascular: Normal rate, regular rhythm, normal heart sounds and intact distal pulses.   Pulmonary/Chest: Effort normal and breath sounds normal. No respiratory distress.  Musculoskeletal: She  exhibits no edema.  Lymphadenopathy:    She has no cervical adenopathy.  Neurological: She is alert and oriented to person, place, and time.  Skin: Skin is warm and dry. She is not diaphoretic. No erythema.  Psychiatric: She has a normal mood and affect. Her behavior is normal.     Soft tissue mass on top of foot     Assessment & Plan:  Will RTC for lab only visit for cholesterol soon and other labs. Last checked 1 yr prior and was nml Dr. Idolina Primer referral retroactively for - Jan  and next yr Refer to podiatry - dm shoes, foot mass.  No asa  Since on xarelto and fleconide  Cont same dm meds - eventually may need to try to wean up on metformin XR '1000mg'$  -> '2000mg'$  and wean down on glipizide '20mg'$  -> '10mg'$  but a1c excellent for now at 7.1 and pt tolerating regimen perfectly.  1. Type 2 diabetes mellitus with hyperglycemia, without long-term current use of insulin ([place])   2. Essential hypertension   3. Paroxysmal atrial fibrillation (HCC)   4. Microhematuria   5. Anemia of chronic disease   6. Mixed hyperlipidemia   7. Uncontrolled type 2 diabetes mellitus without complication, without long-term current use of insulin ([place])     Orders Placed This Encounter  Procedures  . Comprehensive metabolic panel    Order Specific Question:  Has the patient fasted?    Answer:  Yes  . Lipid panel    Order Specific Question:  Has the patient fasted?    Answer:  Yes  . CBC  . POCT urinalysis dipstick  . POCT glycosylated hemoglobin (Hb A1C)  . POCT glucose (manual entry)  . POCT Microscopic Urinalysis (UMFC)    Meds ordered this encounter  Medications  . metFORMIN (GLUCOPHAGE-XR) 500 MG 24 hr tablet    Sig: Take 2 tablets (1,000 mg total) by mouth daily with breakfast.    Dispense:  180 tablet    Refill:  1  . glipiZIDE (GLUCOTROL) 10 MG tablet    Sig: Take 1 tablet (10 mg total) by mouth 2 (two) times daily before a meal.    Dispense:  180 tablet    Refill:  1     Delman Cheadle, M.D.    [facility] [address] [place], [place] [address] [phone] phone [phone] fax  12/18/2015 11:02 PM  Results for orders placed or performed in visit on 11/27/15  Comprehensive metabolic panel  Result Value Ref Range   Sodium 140 135 -  146 mmol/L   Potassium 3.7 3.5 - 5.3 mmol/L   Chloride 102 98 - 110 mmol/L   CO2 28 20 - 31 mmol/L   Glucose, Bld 119 (H) 65 - 99 mg/dL   BUN 13 7 - 25 mg/dL   Creat 1.03 (H) 0.60 - 0.93 mg/dL   Total Bilirubin 0.6 0.2 - 1.2 mg/dL   Alkaline Phosphatase 64 33 - 130 U/L   AST 16 10 - 35 U/L   ALT 12 6 - 29 U/L   Total Protein 7.5 6.1 - 8.1 g/dL   Albumin 4.2 3.6 - 5.1 g/dL   Calcium 9.4 8.6 - 10.4 mg/dL  Lipid panel  Result Value Ref Range   Cholesterol 176 125 - 200 mg/dL   Triglycerides 95 <150 mg/dL   HDL 68 >=46 mg/dL   Total CHOL/HDL Ratio 2.6 <=5.0 Ratio   VLDL 19 <30 mg/dL   LDL Cholesterol 89 <130 mg/dL  CBC  Result Value Ref Range   WBC 3.8 3.8 - 10.8 K/uL   RBC 4.94 3.80 - 5.10 MIL/uL   Hemoglobin 13.6 11.7 - 15.5 g/dL   HCT 42.2 35.0 - 45.0 %   MCV 85.4 80.0 - 100.0 fL   MCH 27.5 27.0 - 33.0 pg   MCHC 32.2 32.0 - 36.0 g/dL   RDW 14.5 11.0 - 15.0 %   Platelets 182 140 - 400 K/uL   MPV 11.0 7.5 - 12.5 fL  POCT urinalysis dipstick  Result Value Ref Range   Color, UA yellow yellow   Clarity, UA clear clear   Glucose, UA negative negative   Bilirubin, UA negative negative   Ketones, POC UA negative negative   Spec Grav, UA 1.015    Blood, UA trace-intact (A) negative   pH, UA 7.5    Protein Ur, POC negative negative   Urobilinogen, UA 0.2    Nitrite, UA Negative Negative   Leukocytes, UA Negative Negative  POCT glycosylated hemoglobin (Hb A1C)  Result Value Ref Range   Hemoglobin A1C 7.1   POCT glucose (manual entry)  Result Value Ref Range   POC Glucose 128 (A) 70 - 99 mg/dl  POCT Microscopic Urinalysis (UMFC)  Result Value Ref Range   WBC,UR,HPF,POC None None WBC/hpf    RBC,UR,HPF,POC None None RBC/hpf   Bacteria None None, Too numerous to count   Mucus Absent Absent   Epithelial Cells, UR Per Microscopy Few (A) None, Too numerous to count cells/hpf

## 2015-11-29 ENCOUNTER — Ambulatory Visit (INDEPENDENT_AMBULATORY_CARE_PROVIDER_SITE_OTHER): Payer: Commercial Managed Care - HMO | Admitting: Family Medicine

## 2015-11-29 DIAGNOSIS — I10 Essential (primary) hypertension: Secondary | ICD-10-CM | POA: Diagnosis not present

## 2015-11-29 DIAGNOSIS — R3129 Other microscopic hematuria: Secondary | ICD-10-CM | POA: Diagnosis not present

## 2015-11-29 DIAGNOSIS — E119 Type 2 diabetes mellitus without complications: Secondary | ICD-10-CM | POA: Diagnosis not present

## 2015-11-29 DIAGNOSIS — E782 Mixed hyperlipidemia: Secondary | ICD-10-CM

## 2015-11-29 DIAGNOSIS — E1165 Type 2 diabetes mellitus with hyperglycemia: Secondary | ICD-10-CM | POA: Diagnosis not present

## 2015-11-29 DIAGNOSIS — R7309 Other abnormal glucose: Secondary | ICD-10-CM | POA: Diagnosis not present

## 2015-11-29 DIAGNOSIS — D638 Anemia in other chronic diseases classified elsewhere: Secondary | ICD-10-CM | POA: Diagnosis not present

## 2015-11-29 LAB — COMPREHENSIVE METABOLIC PANEL
ALBUMIN: 4.2 g/dL (ref 3.6–5.1)
ALK PHOS: 64 U/L (ref 33–130)
ALT: 12 U/L (ref 6–29)
AST: 16 U/L (ref 10–35)
BUN: 13 mg/dL (ref 7–25)
CALCIUM: 9.4 mg/dL (ref 8.6–10.4)
CHLORIDE: 102 mmol/L (ref 98–110)
CO2: 28 mmol/L (ref 20–31)
Creat: 1.03 mg/dL — ABNORMAL HIGH (ref 0.60–0.93)
Glucose, Bld: 119 mg/dL — ABNORMAL HIGH (ref 65–99)
POTASSIUM: 3.7 mmol/L (ref 3.5–5.3)
Sodium: 140 mmol/L (ref 135–146)
TOTAL PROTEIN: 7.5 g/dL (ref 6.1–8.1)
Total Bilirubin: 0.6 mg/dL (ref 0.2–1.2)

## 2015-11-29 LAB — CBC
HEMATOCRIT: 42.2 % (ref 35.0–45.0)
Hemoglobin: 13.6 g/dL (ref 11.7–15.5)
MCH: 27.5 pg (ref 27.0–33.0)
MCHC: 32.2 g/dL (ref 32.0–36.0)
MCV: 85.4 fL (ref 80.0–100.0)
MPV: 11 fL (ref 7.5–12.5)
Platelets: 182 10*3/uL (ref 140–400)
RBC: 4.94 MIL/uL (ref 3.80–5.10)
RDW: 14.5 % (ref 11.0–15.0)
WBC: 3.8 10*3/uL (ref 3.8–10.8)

## 2015-11-29 LAB — LIPID PANEL
CHOL/HDL RATIO: 2.6 ratio (ref <=5.0)
CHOLESTEROL: 176 mg/dL (ref 125–200)
HDL: 68 mg/dL (ref >=46)
LDL Cholesterol: 89 mg/dL (ref <130)
TRIGLYCERIDES: 95 mg/dL (ref <150)
VLDL: 19 mg/dL (ref <30)

## 2015-11-29 NOTE — Progress Notes (Signed)
Here for lab only visit 

## 2016-03-08 ENCOUNTER — Other Ambulatory Visit: Payer: Self-pay | Admitting: Family Medicine

## 2016-03-08 DIAGNOSIS — E785 Hyperlipidemia, unspecified: Secondary | ICD-10-CM

## 2016-03-16 ENCOUNTER — Other Ambulatory Visit: Payer: Self-pay | Admitting: Cardiovascular Disease

## 2016-03-18 ENCOUNTER — Other Ambulatory Visit: Payer: Self-pay | Admitting: Family Medicine

## 2016-03-18 DIAGNOSIS — I10 Essential (primary) hypertension: Secondary | ICD-10-CM

## 2016-03-20 NOTE — Telephone Encounter (Signed)
30 day supply sent to give enough until visit with Dr. Brigitte Pulse 04/01/2016

## 2016-04-01 ENCOUNTER — Ambulatory Visit (INDEPENDENT_AMBULATORY_CARE_PROVIDER_SITE_OTHER): Payer: Commercial Managed Care - HMO | Admitting: Family Medicine

## 2016-04-01 ENCOUNTER — Ambulatory Visit: Payer: Commercial Managed Care - HMO | Admitting: Family Medicine

## 2016-04-01 ENCOUNTER — Encounter: Payer: Self-pay | Admitting: Family Medicine

## 2016-04-01 VITALS — BP 134/66 | HR 58 | Temp 99.0°F | Resp 16 | Ht 63.5 in | Wt 196.2 lb

## 2016-04-01 DIAGNOSIS — G8929 Other chronic pain: Secondary | ICD-10-CM

## 2016-04-01 DIAGNOSIS — M8588 Other specified disorders of bone density and structure, other site: Secondary | ICD-10-CM

## 2016-04-01 DIAGNOSIS — I480 Paroxysmal atrial fibrillation: Secondary | ICD-10-CM

## 2016-04-01 DIAGNOSIS — M5137 Other intervertebral disc degeneration, lumbosacral region: Secondary | ICD-10-CM

## 2016-04-01 DIAGNOSIS — E1165 Type 2 diabetes mellitus with hyperglycemia: Secondary | ICD-10-CM

## 2016-04-01 DIAGNOSIS — I10 Essential (primary) hypertension: Secondary | ICD-10-CM

## 2016-04-01 DIAGNOSIS — E782 Mixed hyperlipidemia: Secondary | ICD-10-CM

## 2016-04-01 DIAGNOSIS — R6889 Other general symptoms and signs: Secondary | ICD-10-CM | POA: Diagnosis not present

## 2016-04-01 DIAGNOSIS — M810 Age-related osteoporosis without current pathological fracture: Secondary | ICD-10-CM

## 2016-04-01 DIAGNOSIS — M545 Low back pain: Secondary | ICD-10-CM

## 2016-04-01 DIAGNOSIS — M8580 Other specified disorders of bone density and structure, unspecified site: Secondary | ICD-10-CM | POA: Insufficient documentation

## 2016-04-01 DIAGNOSIS — Z1239 Encounter for other screening for malignant neoplasm of breast: Secondary | ICD-10-CM

## 2016-04-01 LAB — POCT GLYCOSYLATED HEMOGLOBIN (HGB A1C): Hemoglobin A1C: 6.5

## 2016-04-01 NOTE — Progress Notes (Addendum)
Subjective:    Patient ID: Catherine Joseph, female    DOB: 1940/07/18, 75 y.o.   MRN: [id number]  Chief Complaint  Patient presents with  . Follow-up    4 month - BP,cholesterol and diabetes    HPI  Catherine Joseph is a 75 yo woman who is here for a 4 mo follow-up on her chronic medical problems.  DMII: On metformin and glipizide was having fasting cbgs still 100s-130s.  See Dr. Idolina Primer for reg eye exam. Nml microalb 08/2015. Her a1c was 9. 1 so we increased her metformin XR from 500 to 1000qd, cont on glipizide 10 bid with excellent reponse in a1c which decreased to 7.1. We referred her to podiatry at her last visit for DM shoes. Does not take asa since on xaretlo.  Last LDL 89 with non-hdl of 108 on simvastatin 20  Microscopic hematuria: moderate blood on UA resolved at last visit  A, fib: On xarelto and fleconide.  Sees Dr. Johnsie Cancel - last saw him 05/2015 and does not have f/u appt sched  Morning vertigo - self limited  HTN: On valsartan-hctz 160-25 qd  CKD: baseline Cr 1.0 and has been at baseline  Osteopenia: needs dexa. Last done March 2015 showed osteopenia -1.7 in L spine.  She is originally from [place].  Past Medical History:  Diagnosis Date  . Anemia   . Bradycardia   . Cataract   . Chronic kidney disease   . Diabetes mellitus type II   . Hypertension   . Osteoporosis   . Syncope   . Weakness    Past Surgical History:  Procedure Laterality Date  . ABDOMINAL HYSTERECTOMY    . CESAREAN SECTION    . EYE SURGERY     Current Outpatient Prescriptions on File Prior to Visit  Medication Sig Dispense Refill  . flecainide (TAMBOCOR) 100 MG tablet Take 0.5 tablets (50 mg total) by mouth 2 (two) times daily. 90 tablet 2  . glipiZIDE (GLUCOTROL) 10 MG tablet Take 1 tablet (10 mg total) by mouth 2 (two) times daily before a meal. 180 tablet 1  . metFORMIN (GLUCOPHAGE-XR) 500 MG 24 hr tablet Take 2 tablets (1,000 mg total) by mouth daily with breakfast. 180 tablet 1  .  simvastatin (ZOCOR) 20 MG tablet TAKE 1 TABLET (20 MG TOTAL) BY MOUTH DAILY. 90 tablet 0  . Alcohol Swabs (ALCOHOL PREP) PADS Use to check blood sugar daily. Dx code: 11.65 100 each 3  . Blood Glucose Calibration (SURECHEK CONTROL SOLUTION) Normal LIQD Use to check blood sugar daily. Dx E11.65 1 each 3  . blood glucose meter kit and supplies Use to test blood sugar once daily. Dx: E11.65 1 each 0  . Blood Glucose Monitoring Suppl (TRUE METRIX AIR GLUCOSE METER) DEVI 1 each by Does not apply route daily. Use to test blood sugar once daily. Dx: E11.65 1 Device 0  . clotrimazole (GYNE-LOTRIMIN 3) 2 % vaginal cream Place 1 Applicatorful vaginally at bedtime. (Patient not taking: Reported on 04/01/2016) 21 g 2  . glucose blood (TRUE METRIX BLOOD GLUCOSE TEST) test strip Use to test blood sugar once daily. Dx: E11.65 100 each 3  . TRUEPLUS LANCETS 28G MISC Use to test blood sugar once daily. Dx: E11.65 100 each 3   No current facility-administered medications on file prior to visit.    Allergies  Allergen Reactions  . Codeine Other (See Comments)    vertigo   Family History  Problem Relation Age of Onset  .  Diabetes Sister   . Hyperlipidemia Sister   . Hypertension Sister   . Diabetes Brother   . Hyperlipidemia Brother   . Hypertension Brother    Social History   Social History  . Marital status: Widowed    Spouse name: N/A  . Number of children: N/A  . Years of education: N/A   Social History Main Topics  . Smoking status: Never Smoker  . Smokeless tobacco: None  . Alcohol use No  . Drug use: No  . Sexual activity: Not Asked   Other Topics Concern  . None   Social History Narrative  . None   Depression screen [facility] 2/9 04/01/2016 11/27/2015 08/28/2015 07/02/2015 06/09/2015  Decreased Interest 0 0 0 0 0  Down, Depressed, Hopeless 0 0 0 0 0  PHQ - 2 Score 0 0 0 0 0    . Review of Systems See hpi    Objective:   Physical Exam  Constitutional: She is oriented to person,  place, and time. She appears well-developed and well-nourished. No distress.  HENT:  Head: Normocephalic and atraumatic.  Right Ear: External ear normal.  Left Ear: External ear normal.  Eyes: Conjunctivae are normal. No scleral icterus.  Neck: Normal range of motion. Neck supple. No thyromegaly present.  Cardiovascular: Normal rate, regular rhythm, normal heart sounds and intact distal pulses.   Pulmonary/Chest: Effort normal and breath sounds normal. No respiratory distress.  Musculoskeletal: She exhibits no edema.  Lymphadenopathy:    She has no cervical adenopathy.  Neurological: She is alert and oriented to person, place, and time.  Skin: Skin is warm and dry. She is not diaphoretic. No erythema.  Psychiatric: She has a normal mood and affect. Her behavior is normal.   BP 134/66 (BP Location: Left Arm, Patient Position: Sitting, Cuff Size: Large)   Pulse (!) 58   Temp 99 F (37.2 C) (Oral)   Resp 16   Ht 5' 3.5" (1.613 m)   Wt 196 lb 3.2 oz (89 kg)   SpO2 100%   BMI 34.21 kg/m   Results for orders placed or performed in visit on 04/01/16  POCT glycosylated hemoglobin (Hb A1C)  Result Value Ref Range   Hemoglobin A1C 6.5         Assessment & Plan:   ORDER DEXA and mam  1. Type 2 diabetes mellitus with hyperglycemia, without long-term current use of insulin ([place])   2. Essential hypertension   3. Paroxysmal atrial fibrillation (HCC)   4. Mixed hyperlipidemia - Consider changing simvastatin to other and increasing but likely refer to Dr. Johnsie Cancel  5. Osteopenia, unspecified osteoporosis type, unspecified pathological fracture presence - needs repeat dexa.  Will try lumbar orthosis brace to reduce pain by restricting mobility and support weak spinal muscles.    Orders Placed This Encounter  Procedures  . POCT glycosylated hemoglobin (Hb A1C)    Delman Cheadle, M.D.  [facility] [address] [place], [place] [address] [phone]  phone [phone] fax  04/23/16 10:03 PM

## 2016-04-01 NOTE — Patient Instructions (Addendum)
Try lidocaine 4% pain patches on your back.   And you can use tylenol 2 tabs 2-3x/d.  Make an appointment to recheck with Dr. Blima Singer are do for a bone scan which they will do at the same time as your mammogram. You can schedule your mammogram and your bone scan now.  Please call the breast center at [facility] at [phone] to schedule    IF you received an x-ray today, you will receive an invoice from [facility]. Please contact [facility] at [phone] with questions or concerns regarding your invoice.   IF you received labwork today, you will receive an invoice from [employer]. Please contact [facility] at [phone] with questions or concerns regarding your invoice.   Our billing staff will not be able to assist you with questions regarding bills from these companies.  You will be contacted with the lab results as soon as they are available. The fastest way to get your results is to activate your My Chart account. Instructions are located on the last page of this paperwork. If you have not heard from [place] regarding the results in 2 weeks, please contact this office.    Bone Health Bones protect organs, store calcium, and anchor muscles. Good health habits, such as eating nutritious foods and exercising regularly, are important for maintaining healthy bones. They can also help to prevent a condition that causes bones to lose density and become weak and brittle (osteoporosis). WHY IS BONE MASS IMPORTANT? Bone mass refers to the amount of bone tissue that you have. The higher your bone mass, the stronger your bones. An important step toward having healthy bones throughout life is to have strong and dense bones during childhood. A young adult who has a high bone mass is more likely to have a high bone mass later in life. Bone mass at its greatest it is called peak bone mass. A large decline in bone mass occurs in older adults. In  women, it occurs about the time of menopause. During this time, it is important to practice good health habits, because if more bone is lost than what is replaced, the bones will become less healthy and more likely to break (fracture). If you find that you have a low bone mass, you may be able to prevent osteoporosis or further bone loss by changing your diet and lifestyle. HOW CAN I FIND OUT IF MY BONE MASS IS LOW? Bone mass can be measured with an X-ray test that is called a bone mineral density (BMD) test. This test is recommended for all women who are age 65 or older. It may also be recommended for men who are age 44 or older, or for people who are more likely to develop osteoporosis due to:  Having bones that break easily.  Having a long-term disease that weakens bones, such as kidney disease or rheumatoid arthritis.  Having menopause earlier than normal.  Taking medicine that weakens bones, such as steroids, thyroid hormones, or hormone treatment for breast cancer or prostate cancer.  Smoking.  Drinking three or more alcoholic drinks each day. WHAT ARE THE NUTRITIONAL RECOMMENDATIONS FOR HEALTHY BONES? To have healthy bones, you need to get enough of the right minerals and vitamins. Most nutrition experts recommend getting these nutrients from the foods that you eat. Nutritional recommendations vary from person to person. Ask your health care provider what is healthy for you. Here are some general guidelines. Calcium Recommendations Calcium is the most important (essential)  mineral for bone health. Most people can get enough calcium from their diet, but supplements may be recommended for people who are at risk for osteoporosis. Good sources of calcium include:  Dairy products, such as low-fat or nonfat milk, cheese, and yogurt.  Dark green leafy vegetables, such as bok choy and broccoli.  Calcium-fortified foods, such as orange juice, cereal, bread, soy beverages, and tofu  products.  Nuts, such as almonds. Follow these recommended amounts for daily calcium intake:  Children, age 892-3: 700 mg.  Children, age 89-8: 1,000 mg.  Children, age 893-13: 1,300 mg.  Teens, age 890-18: 1,300 mg.  Adults, age 57-50: 1,000 mg.  Adults, age 77-70:  Men: 1,000 mg.  Women: 1,200 mg.  Adults, age 65 or older: 1,200 mg.  Pregnant and breastfeeding females:  Teens: 1,300 mg.  Adults: 1,000 mg. Vitamin D Recommendations Vitamin D is the most essential vitamin for bone health. It helps the body to absorb calcium. Sunlight stimulates the skin to make vitamin D, so be sure to get enough sunlight. If you live in a cold climate or you do not get outside often, your health care provider may recommend that you take vitamin D supplements. Good sources of vitamin D in your diet include:  Egg yolks.  Saltwater fish.  Milk and cereal fortified with vitamin D. Follow these recommended amounts for daily vitamin D intake:  Children and teens, age 892-18: 600 international units.  Adults, age 79 or younger: 400-800 international units.  Adults, age 892 or older: 800-1,000 international units. Other Nutrients Other nutrients for bone health include:  Phosphorus. This mineral is found in meat, poultry, dairy foods, nuts, and legumes. The recommended daily intake for adult men and adult women is 700 mg.  Magnesium. This mineral is found in seeds, nuts, dark green vegetables, and legumes. The recommended daily intake for adult men is 400-420 mg. For adult women, it is 310-320 mg.  Vitamin K. This vitamin is found in green leafy vegetables. The recommended daily intake is 120 mg for adult men and 90 mg for adult women. WHAT TYPE OF PHYSICAL ACTIVITY IS BEST FOR BUILDING AND MAINTAINING HEALTHY BONES? Weight-bearing and strength-building activities are important for building and maintaining peak bone mass. Weight-bearing activities cause muscles and bones to work against gravity.  Strength-building activities increases muscle strength that supports bones. Weight-bearing and muscle-building activities include:  Walking and hiking.  Jogging and running.  Dancing.  Gym exercises.  Lifting weights.  Tennis and racquetball.  Climbing stairs.  Aerobics. Adults should get at least 30 minutes of moderate physical activity on most days. Children should get at least 60 minutes of moderate physical activity on most days. Ask your health care provide what type of exercise is best for you. WHERE CAN I FIND MORE INFORMATION? For more information, check out the following websites:  [place]: YardHomes.se  [employer] of Health: http://www.niams.AnonymousEar.fr.asp   This information is not intended to replace advice given to you by your health care provider. Make sure you discuss any questions you have with your health care provider.   Document Released: 08/28/2003 Document Revised: 10/22/2014 Document Reviewed: 06/12/2014 Elsevier Interactive Patient Education [employer].

## 2016-04-10 ENCOUNTER — Other Ambulatory Visit: Payer: Self-pay | Admitting: Cardiovascular Disease

## 2016-04-15 ENCOUNTER — Other Ambulatory Visit: Payer: Self-pay | Admitting: Family Medicine

## 2016-04-15 DIAGNOSIS — I10 Essential (primary) hypertension: Secondary | ICD-10-CM

## 2016-04-22 ENCOUNTER — Telehealth: Payer: Self-pay

## 2016-04-22 NOTE — Telephone Encounter (Signed)
PATIENT SAID DR. SHAW GAVE HER A SAMPLE OF XARELTO 20 MG. SHE WOULD LIKE TO HAVE IT CALLED INTO HER PHARMACY. SHE ALSO SAID SHE HAS A COUGH AT NIGHT AND WHAT SHOULD SHE TAKE SINCE SHE HAS DIABETES SHE KNOW SHE CAN NOT TAKE EVERYTHING? BEST PHONE [phone] (CELL)  PHARMACY CHOICE IS [facility] ON [address].  [place]

## 2016-04-23 NOTE — Telephone Encounter (Signed)
You can see in the med list that Dr. Johnsie Cancel, pt's cardiologist, sent in a 90d supply of xarelto to her [facility] on 10/23.  They do want to see pt within the next 3 months for additional refills. She can take plan mucinex for her cough if she has a little congestion and try using a humidifier at night.  Some nasal saline during the day can help thin congestion if it is coming from post-nasal drip.  If she is having some heartburn at night she can try zantac or pepcid.  There are other thins that can cause night-time cough so if continues, come in for further eval

## 2016-04-26 NOTE — Telephone Encounter (Signed)
Called patient.  She was aware that the Xarelto had been called into pharmacy.  I gave her the other instructions in Dr.Shaw's message.

## 2016-05-10 ENCOUNTER — Other Ambulatory Visit: Payer: Self-pay | Admitting: Family Medicine

## 2016-05-10 ENCOUNTER — Telehealth: Payer: Self-pay

## 2016-05-10 DIAGNOSIS — E785 Hyperlipidemia, unspecified: Secondary | ICD-10-CM

## 2016-05-10 NOTE — Telephone Encounter (Signed)
SHAW - Pt called requesting a refill on her omega 3 and lidocaine.  Her pharmacy was on the phone with her (3-way call) and has faxed over a form for you to fill out about this.  I told her that I did not see that we had prescribed the lidocaine before and that she may be required to come in.  [phone]

## 2016-05-11 NOTE — Telephone Encounter (Signed)
I do not have a fax from [facility] yet, will watch for it. RFd other meds req'd electronically. OV note plan states for pt to try OTC lidocaine 4% patches.

## 2016-05-20 ENCOUNTER — Telehealth: Payer: Self-pay | Admitting: Family Medicine

## 2016-05-20 NOTE — Telephone Encounter (Signed)
Sounds good, thanks. Will be back in office 12/4

## 2016-05-20 NOTE — Telephone Encounter (Signed)
Received an order form from [facility] mail order for Rx for lidocaine 5%. I have completed form and will put in Dr Raul Del box for signature.

## 2016-05-20 NOTE — Telephone Encounter (Signed)
Pt states she will call on Dec 4th to see if paper work has been signed

## 2016-05-21 NOTE — Telephone Encounter (Signed)
Pt forms completed and placed in Dr. Raul Del box for signature with note on it - Medical records need to reflect the medical necessity of lumbar brace.

## 2016-05-24 NOTE — Addendum Note (Signed)
Addended by: Delman Cheadle on: 05/24/2016 10:36 AM   Modules accepted: Orders

## 2016-05-24 NOTE — Telephone Encounter (Signed)
Signed and placed in  Nurse box - needs to find out how many inches around waist at patient's navel.

## 2016-05-25 NOTE — Telephone Encounter (Signed)
Form completed for Lumbar brace for ADS.\ 12/2 spoke with dtr who was going to let us know that pt ordered this  From diabetic supply and waist measurement.  Unable to leave msg with pt.  Attempted again today. Signed X-large 45-48" waist.  Form faxed to [facility] [phone] - form placed in stack for scanning.

## 2016-05-25 NOTE — Telephone Encounter (Signed)
Unable to get in touch with pt.  Used xlarge box. Faxed

## 2016-05-28 ENCOUNTER — Telehealth: Payer: Self-pay | Admitting: Emergency Medicine

## 2016-05-28 ENCOUNTER — Other Ambulatory Visit: Payer: Self-pay | Admitting: Family Medicine

## 2016-05-28 DIAGNOSIS — IMO0001 Reserved for inherently not codable concepts without codable children: Secondary | ICD-10-CM

## 2016-05-28 DIAGNOSIS — E1165 Type 2 diabetes mellitus with hyperglycemia: Principal | ICD-10-CM

## 2016-05-29 NOTE — Telephone Encounter (Signed)
04/01/16 last visit  09/30/16 next visit  Ok to refill per protocol

## 2016-05-31 DIAGNOSIS — M545 Low back pain: Secondary | ICD-10-CM | POA: Diagnosis not present

## 2016-06-08 ENCOUNTER — Other Ambulatory Visit: Payer: Self-pay | Admitting: Family Medicine

## 2016-06-08 DIAGNOSIS — Z1231 Encounter for screening mammogram for malignant neoplasm of breast: Secondary | ICD-10-CM

## 2016-06-30 ENCOUNTER — Other Ambulatory Visit: Payer: Self-pay | Admitting: Cardiovascular Disease

## 2016-06-30 ENCOUNTER — Other Ambulatory Visit: Payer: Self-pay | Admitting: Family Medicine

## 2016-06-30 DIAGNOSIS — I10 Essential (primary) hypertension: Secondary | ICD-10-CM

## 2016-06-30 DIAGNOSIS — I480 Paroxysmal atrial fibrillation: Secondary | ICD-10-CM

## 2016-07-02 ENCOUNTER — Ambulatory Visit
Admission: RE | Admit: 2016-07-02 | Discharge: 2016-07-02 | Disposition: A | Discharge status: Home / Self Care | Payer: Medicare HMO | Source: Ambulatory Visit | Attending: Family Medicine | Admitting: Family Medicine

## 2016-07-02 ENCOUNTER — Encounter: Payer: Self-pay | Admitting: Family Medicine

## 2016-07-02 DIAGNOSIS — Z1231 Encounter for screening mammogram for malignant neoplasm of breast: Secondary | ICD-10-CM

## 2016-07-02 DIAGNOSIS — M545 Low back pain, unspecified: Secondary | ICD-10-CM

## 2016-07-02 DIAGNOSIS — M8588 Other specified disorders of bone density and structure, other site: Secondary | ICD-10-CM

## 2016-07-02 DIAGNOSIS — G8929 Other chronic pain: Secondary | ICD-10-CM

## 2016-07-03 ENCOUNTER — Ambulatory Visit (INDEPENDENT_AMBULATORY_CARE_PROVIDER_SITE_OTHER): Payer: Commercial Managed Care - HMO | Admitting: Family Medicine

## 2016-07-03 VITALS — BP 130/80 | HR 51 | Temp 97.6°F | Resp 17 | Ht 64.0 in | Wt 193.0 lb

## 2016-07-03 DIAGNOSIS — E782 Mixed hyperlipidemia: Secondary | ICD-10-CM | POA: Diagnosis not present

## 2016-07-03 DIAGNOSIS — I10 Essential (primary) hypertension: Secondary | ICD-10-CM | POA: Diagnosis not present

## 2016-07-03 DIAGNOSIS — D638 Anemia in other chronic diseases classified elsewhere: Secondary | ICD-10-CM

## 2016-07-03 DIAGNOSIS — Z5181 Encounter for therapeutic drug level monitoring: Secondary | ICD-10-CM

## 2016-07-03 DIAGNOSIS — E1165 Type 2 diabetes mellitus with hyperglycemia: Secondary | ICD-10-CM

## 2016-07-03 DIAGNOSIS — I480 Paroxysmal atrial fibrillation: Secondary | ICD-10-CM | POA: Diagnosis not present

## 2016-07-03 DIAGNOSIS — M8580 Other specified disorders of bone density and structure, unspecified site: Secondary | ICD-10-CM | POA: Diagnosis not present

## 2016-07-03 DIAGNOSIS — Z7901 Long term (current) use of anticoagulants: Secondary | ICD-10-CM | POA: Diagnosis not present

## 2016-07-03 DIAGNOSIS — E785 Hyperlipidemia, unspecified: Secondary | ICD-10-CM

## 2016-07-03 LAB — POCT GLYCOSYLATED HEMOGLOBIN (HGB A1C): HEMOGLOBIN A1C: 6.6

## 2016-07-03 MED ORDER — SIMVASTATIN 20 MG PO TABS: 20.0000 mg | ORAL_TABLET | Freq: Every day | ORAL | 1 refills | Status: DC

## 2016-07-03 MED ORDER — VALSARTAN-HYDROCHLOROTHIAZIDE 160-25 MG PO TABS: 1.0000 | ORAL_TABLET | Freq: Every day | ORAL | 1 refills | Status: DC

## 2016-07-03 MED ORDER — CLOTRIMAZOLE 2 % VA CREA
1.0000 | TOPICAL_CREAM | Freq: Every day | VAGINAL | 2 refills | Status: DC
Start: 2016-07-03 — End: 2016-08-09

## 2016-07-03 NOTE — Progress Notes (Addendum)
Subjective:  By signing my name below, I, Moises Blood, attest that this documentation has been prepared under the direction and in the presence of Delman Cheadle, MD. Electronically Signed: Moises Blood, [place]. 07/03/2016 , 1:03 PM .  Patient was seen in Room 11 .   Patient ID: Catherine Joseph, female    DOB: 11-29-1940, 76 y.o.   MRN: [id number] Chief Complaint  Patient presents with  . Discuss Medications  . Medication Refill    Clotrimazole cream, simvastatin, valsartan-hctz   HPI Catherine Joseph is a 76 y.o. female who presents to [facility] to discuss medications and refill of clotrimazole cream, simvastatin, and valsartan-HCTZ. She had bone test done yesterday, which looked good as her bones have not gotten thinner since last bone test in 2015, with mild osteopenia.   Patient states her medications will cost $195 all together for a month. Usually, she doesn't pay for it. So, she tried calling her insurance and [employer] informed her that she needs to pay deductible before her insurance will help cover her medications. She has medication for this month and has to pay for it all. [employer] instructed her to come in for office visit and "ask for samples or have medications changed". She does not have a copy of her formulary and does not know what needs to be changed or what it needs to be changed to.  Pt states she has medication for the rest of the month (so 2 additional weeks) and that she will have the $ to pay for her "deductable" of $195 at the beginning of the next month so pt estimates she she may be out of the medication for maybe a week or less.  She thinks that after the initial $195 fee, her future rx copays will reduce back to the prior baseline of $50/mo.  She was on warfarin up to 18 months prior, but very non-compliant as she doesn't show up for her INR visits. She was switched to Xarelto in May; last Dec she was still in a flutter and had gone on and off of flecainide due to cost.  She notes having an appointment with Dr. Johnsie Cancel next month. She says that she does her insurance and her bills by herself.   She's eaten breakfast this morning. She's checked her sugars in the morning, it was 98 this morning; usually runs in the 90s up to 115-120. She denies low sugar readings down to 60~70. She hasn't seen her eye doctor recently, but will see next month.   Past Medical History:  Diagnosis Date  . Anemia   . Bradycardia   . Cataract   . Chronic kidney disease   . Diabetes mellitus type II   . Hypertension   . Osteoporosis   . Syncope   . Weakness    Prior to Admission medications   Medication Sig Start Date End Date Taking? Authorizing Provider  Alcohol Swabs (ALCOHOL PREP) PADS Use to check blood sugar daily. Dx code: 11.65 09/08/15  Yes Shawnee Knapp, MD  Blood Glucose Calibration (SURECHEK CONTROL SOLUTION) Normal LIQD Use to check blood sugar daily. Dx E11.65 09/08/15  Yes Shawnee Knapp, MD  blood glucose meter kit and supplies Use to test blood sugar once daily. Dx: E11.65 07/09/15  Yes Gay Filler Copland, MD  Blood Glucose Monitoring Suppl (TRUE METRIX AIR GLUCOSE METER) DEVI 1 each by Does not apply route daily. Use to test blood sugar once daily. Dx: E11.65 09/09/15  Yes Colletta Maryland D  English, PA  clotrimazole (GYNE-LOTRIMIN 3) 2 % vaginal cream Place 1 Applicatorful vaginally at bedtime. 08/28/15  Yes Shawnee Knapp, MD  flecainide (TAMBOCOR) 100 MG tablet Take 0.5 tablets (50 mg total) by mouth 2 (two) times daily. 06/30/16  Yes Josue Hector, MD  glipiZIDE (GLUCOTROL) 10 MG tablet TAKE 1 TABLET (10 MG TOTAL) BY MOUTH 2 (TWO) TIMES DAILY BEFORE A MEAL. 05/29/16  Yes Chelle Jeffery, PA-C  glucose blood (TRUE METRIX BLOOD GLUCOSE TEST) test strip Use to test blood sugar once daily. Dx: E11.65 09/09/15  Yes Colletta Maryland D English, PA  metFORMIN (GLUCOPHAGE-XR) 500 MG 24 hr tablet TAKE 2 TABLETS EVERY DAY WITH BREAKFAST 05/11/16  Yes Shawnee Knapp, MD  rivaroxaban (XARELTO) 20 MG TABS tablet  Take 1 tablet (20 mg total) by mouth daily with supper. PLEASE CALL OFFICE TO SCHEDULE FOLLOW UP APPT [phone] 04/12/16  Yes Josue Hector, MD  simvastatin (ZOCOR) 20 MG tablet TAKE 1 TABLET EVERY DAY 05/11/16  Yes Shawnee Knapp, MD  TRUEPLUS LANCETS 28G MISC Use to test blood sugar once daily. Dx: E11.65 09/09/15  Yes Dorian Heckle English, PA  valsartan-hydrochlorothiazide (DIOVAN-HCT) 160-25 MG tablet TAKE 1 TABLET EVERY DAY 04/16/16  Yes Shawnee Knapp, MD   Allergies  Allergen Reactions  . Codeine Other (See Comments)    vertigo   Review of Systems  Constitutional: Negative for chills, fatigue, fever and unexpected weight change.  Respiratory: Negative for cough.   Gastrointestinal: Negative for constipation, diarrhea, nausea and vomiting.  Skin: Negative for rash and wound.  Neurological: Negative for dizziness, weakness and headaches.       Objective:   Physical Exam  Constitutional: She is oriented to person, place, and time. She appears well-developed and well-nourished. No distress.  HENT:  Head: Normocephalic and atraumatic.  Eyes: EOM are normal. Pupils are equal, round, and reactive to light.  Neck: Neck supple.  Cardiovascular: Normal rate.   Pulmonary/Chest: Effort normal. No respiratory distress.  Musculoskeletal: Normal range of motion.  Neurological: She is alert and oriented to person, place, and time.  Skin: Skin is warm and dry.  Psychiatric: She has a normal mood and affect. Her behavior is normal.  Nursing note and vitals reviewed.   BP 130/80   Pulse (!) 51   Temp 97.6 F (36.4 C) (Oral)   Resp 17   Ht _0  (1.626 m)   Wt 193 lb (87.5 kg)   SpO2 98%   BMI 33.13 kg/m     Results for orders placed or performed in visit on 07/03/16  Comprehensive metabolic panel  Result Value Ref Range   Glucose 181 (H) 65 - 99 mg/dL   BUN 13 8 - 27 mg/dL   Creatinine, Ser 0.96 0.57 - 1.00 mg/dL   GFR calc non Af Amer 57 (L) >59 mL/min/1.73   GFR calc Af Amer 66  >59 mL/min/1.73   BUN/Creatinine Ratio 14 12 - 28   Sodium 140 134 - 144 mmol/L   Potassium 3.9 3.5 - 5.2 mmol/L   Chloride 99 96 - 106 mmol/L   CO2 26 18 - 29 mmol/L   Calcium 9.7 8.7 - 10.3 mg/dL   Total Protein 7.2 6.0 - 8.5 g/dL   Albumin 4.2 3.5 - 4.8 g/dL   Globulin, Total 3.0 1.5 - 4.5 g/dL   Albumin/Globulin Ratio 1.4 1.2 - 2.2   Bilirubin Total 0.4 0.0 - 1.2 mg/dL   Alkaline Phosphatase 74 39 - 117 IU/L  AST 18 0 - 40 IU/L   ALT 8 0 - 32 IU/L  POCT glycosylated hemoglobin (Hb A1C)  Result Value Ref Range   Hemoglobin A1C 6.6    Diabetic Foot Exam - Simple   Simple Foot Form Visual Inspection No deformities, no ulcerations, no other skin breakdown bilaterally:  Yes See comments:  Yes Sensation Testing Intact to touch and monofilament testing bilaterally:  Yes Pulse Check Posterior Tibialis and Dorsalis pulse intact bilaterally:  Yes Comments Right foot has a bunion by big toe and Left foot has bunion on big toe, little toe and soft tissue " lump" on top of foot     Assessment & Plan:   1. Type 2 diabetes mellitus with hyperglycemia, without long-term current use of insulin (HCC) - well controlled with a1c 6.6 today, cont metformin xr 158m qam and glipizide 10 bid - doing well w/o side effects, no hypoglycemia, gets recurrent vaginal moniliasis so refilled vag clotrimazole supp  2. Osteopenia, unspecified location - unchanged over the past sev yr  3. Essential hypertension - well-controlled, refilled valsartan-hctz 160-25  4. Mixed hyperlipidemia - not fasting today, reminded pt to be fasting for next visit, cont simvastatin 20  5. Anemia of chronic disease   6. Medication monitoring encounter   7. Long term current use of anticoagulant therapy   8. Paroxysmal atrial fibrillation (HCC) - on xarelto and flecainide  9. Dyslipidemia    Patient presents today stating that her insurance company told her that if she could not afford her medications she should "ask for  samples." Patient is not sure which medications are driving up her cost nor if there are less expensive alternatives on her insurance formulary. She does not have the formulary available. It is very difficult to understand patient's concerns. I was unaware that this was a possible version of Medicare part D plan but by my best understanding of this situation, Ms. JKimmerreports that she is going to have to pay a larger C for her first month of medication which will meet her prescription "deductible" after which her co-pays should drop to the prior level of $45-$50 per month. Patient thinks she has several weeks more of medication as well as she will likely have the money to pay the higher cost of this initial month of the new year at the beginning of next month.    What this really boils down to is that patient will likely have to go off her Xarelto and flecainide for several days at the end of the month unless she can get samples. Unfortunately we do not stock any samples here. I will see if we can contact the Xarelto drug wrap to help u[place]ain this emergency. Patient also advised to call: Heart care where she is followed for this as well. Patient was initially on Coumadin last year but was noncompliant with recommended INR follow-ups so changing her back to this due to cost would certainly be a risk.  Recheck 6 mos.  Orders Placed This Encounter  Procedures  . Comprehensive metabolic panel  . POCT glycosylated hemoglobin (Hb A1C)  . HM DIABETES FOOT EXAM    Meds ordered this encounter  Medications  . valsartan-hydrochlorothiazide (DIOVAN-HCT) 160-25 MG tablet    Sig: Take 1 tablet by mouth daily.    Dispense:  90 tablet    Refill:  1  . simvastatin (ZOCOR) 20 MG tablet    Sig: Take 1 tablet (20 mg total) by mouth daily.  Dispense:  90 tablet    Refill:  1  . clotrimazole (GYNE-LOTRIMIN 3) 2 % vaginal cream    Sig: Place 1 Applicatorful vaginally at bedtime.    Dispense:  21 g    Refill:   2   Over 40 min spent in face-to-face evaluation of and consultation with patient and coordination of care.  Over 50% of this time was spent counseling this patient.  I personally performed the services described in this documentation, which was scribed in my presence. The recorded information has been reviewed and considered, and addended by me as needed.   Delman Cheadle, M.D.  [facility] [address] [place], [place] [address] [phone] phone [phone] fax  07/05/16 10:42 PM

## 2016-07-03 NOTE — Patient Instructions (Signed)
     IF you received an x-ray today, you will receive an invoice from [facility]. Please contact  Radiology at [phone] with questions or concerns regarding your invoice.   IF you received labwork today, you will receive an invoice from [facility]. Please contact [facility] at [phone] with questions or concerns regarding your invoice.   Our billing staff will not be able to assist you with questions regarding bills from these companies.  You will be contacted with the lab results as soon as they are available. The fastest way to get your results is to activate your My Chart account. Instructions are located on the last page of this paperwork. If you have not heard from us regarding the results in 2 weeks, please contact this office.     

## 2016-07-04 LAB — COMPREHENSIVE METABOLIC PANEL
A/G RATIO: 1.4 (ref 1.2–2.2)
ALT: 8 IU/L (ref 0–32)
AST: 18 IU/L (ref 0–40)
Albumin: 4.2 g/dL (ref 3.5–4.8)
Alkaline Phosphatase: 74 IU/L (ref 39–117)
BILIRUBIN TOTAL: 0.4 mg/dL (ref 0.0–1.2)
BUN / CREAT RATIO: 14 (ref 12–28)
BUN: 13 mg/dL (ref 8–27)
CHLORIDE: 99 mmol/L (ref 96–106)
CO2: 26 mmol/L (ref 18–29)
Calcium: 9.7 mg/dL (ref 8.7–10.3)
Creatinine, Ser: 0.96 mg/dL (ref 0.57–1.00)
GFR calc non Af Amer: 57 mL/min/{1.73_m2} — ABNORMAL LOW (ref >59)
GFR, EST AFRICAN AMERICAN: 66 mL/min/{1.73_m2} (ref >59)
Globulin, Total: 3 g/dL (ref 1.5–4.5)
Glucose: 181 mg/dL — ABNORMAL HIGH (ref 65–99)
POTASSIUM: 3.9 mmol/L (ref 3.5–5.2)
Sodium: 140 mmol/L (ref 134–144)
TOTAL PROTEIN: 7.2 g/dL (ref 6.0–8.5)

## 2016-07-10 ENCOUNTER — Encounter: Payer: Self-pay | Admitting: *Deleted

## 2016-07-12 ENCOUNTER — Telehealth: Payer: Self-pay | Admitting: Cardiovascular Disease

## 2016-07-12 NOTE — Progress Notes (Signed)
Spoke with cardiologists office pam rn will call me back about samples or med suggestions

## 2016-07-12 NOTE — Telephone Encounter (Signed)
Ok

## 2016-07-12 NOTE — Telephone Encounter (Signed)
Called patient about her xarelto. Patient stated currently she only has a few more pills, but she is taking her xarelto regularly. Offered patient a one week supply of xarelto that will get her through the rest of the month. Patient has office visit on 07/22/16 with Dr. Johnsie Cancel. Will see if Vaughan Basta, pre auth nurse can help patient with cost, if not Dr. Johnsie Cancel can review at patient's office visit and discuss options. Patient is agreeable to this plan. Sample will be left at front desk and will forward to Dr. Johnsie Cancel, so he is aware.

## 2016-07-12 NOTE — Telephone Encounter (Signed)
New Message     Xarelto 20 mg daily do you have any samples or could you put her on something else, pt can not afford to get these filled

## 2016-07-12 NOTE — Progress Notes (Signed)
Pam the nurse for cardiology will check on samples and alternatives from cardiology and contact the patient

## 2016-07-14 NOTE — Progress Notes (Signed)
Patient ID: Catherine Joseph, female   DOB: February 28, 1941, 76 y.o.   MRN: [id number]   75 y.o.  [place] female initially referred by Dr Verline Lema 2/13 for bradycardia and dizzyness. Basically weak and "whoozy" . No SSCP. No previous w/u for structural heart disease. No history of seizures or neurologic issues. CRF;s DM/HTN and elevated lipiids. No history of hypoglycemia. Labs reviewed from 1/13 and TSH normal not anemic and HbA1c 7.1   Normal stress echo 08/27/11  Reviewed event monitor SR PAC/PVC no significant arrhythmia  Referred back by Dr Edilia Bo for atypical chest pain 11/19/14  Had "tingling" in chest Not exertional lasts minutes More at night Can go to gym and Zumba class without difficulty.   Event monitor showed atrial flutter 9/15  Started on flecainide  Seen by [facility] and started on coumadin   Seen by him 07/2014 and patient had stopped flecainide due to cost but then restarted   Changed to xarelto  But worried about cost of this   Luz Lex to [place] for extended periods  Lives with daughter/husband and 3 grand children ages 66, 7 and 78   ROS: Denies fever, malais, weight loss, blurry vision, decreased visual acuity, cough, sputum, SOB, hemoptysis, pleuritic pain, palpitaitons, heartburn, abdominal pain, melena, lower extremity edema, claudication, or rash.  All other systems reviewed and negative  General: Affect appropriate Overweight black female  HEENT: normal Neck supple with no adenopathy JVP normal no bruits no thyromegaly Lungs clear with no wheezing and good diaphragmatic motion Heart:  S1/S2 SEM  murmur, no rub, gallop or click PMI normal Abdomen: benighn, BS positve, no tenderness, no AAA no bruit.  No HSM or HJR Distal pulses intact with no bruits No edema Neuro non-focal Skin warm and dry No muscular weakness   Current Outpatient Prescriptions  Medication Sig Dispense Refill  . Alcohol Swabs (ALCOHOL PREP) PADS Use to check blood sugar daily. Dx code:  11.65 100 each 3  . Blood Glucose Calibration (SURECHEK CONTROL SOLUTION) Normal LIQD Use to check blood sugar daily. Dx E11.65 1 each 3  . blood glucose meter kit and supplies Use to test blood sugar once daily. Dx: E11.65 1 each 0  . Blood Glucose Monitoring Suppl (TRUE METRIX AIR GLUCOSE METER) DEVI 1 each by Does not apply route daily. Use to test blood sugar once daily. Dx: E11.65 1 Device 0  . clotrimazole (GYNE-LOTRIMIN 3) 2 % vaginal cream Place 1 Applicatorful vaginally at bedtime. 21 g 2  . flecainide (TAMBOCOR) 100 MG tablet Take 0.5 tablets (50 mg total) by mouth 2 (two) times daily. 180 tablet 3  . glipiZIDE (GLUCOTROL) 10 MG tablet TAKE 1 TABLET (10 MG TOTAL) BY MOUTH 2 (TWO) TIMES DAILY BEFORE A MEAL. 180 tablet 2  . glucose blood (TRUE METRIX BLOOD GLUCOSE TEST) test strip Use to test blood sugar once daily. Dx: E11.65 100 each 3  . metFORMIN (GLUCOPHAGE-XR) 500 MG 24 hr tablet TAKE 2 TABLETS EVERY DAY WITH BREAKFAST 180 tablet 1  . PAZEO 0.7 % SOLN Place 1 drop into both eyes daily.  11  . rivaroxaban (XARELTO) 20 MG TABS tablet Take 1 tablet (20 mg total) by mouth daily with supper. 90 tablet 3  . simvastatin (ZOCOR) 20 MG tablet Take 1 tablet (20 mg total) by mouth daily. 90 tablet 1  . TRUEPLUS LANCETS 28G MISC Use to test blood sugar once daily. Dx: E11.65 100 each 3  . valsartan-hydrochlorothiazide (DIOVAN-HCT) 160-25 MG tablet Take 1 tablet by mouth  daily. 90 tablet 1   No current facility-administered medications for this visit.     Allergies  Codeine  Electrocardiogram:   08/10/14 SR rate 59 normal  07/22/16 SR rate 63 normal  07/22/16  SR 63 normal   Assessment and Plan PAF:  Continue flecainide improved  Continue xarelto  HTN:  Well controlled.  Continue current medications and low sodium Dash type diet.   Chol:   Cholesterol is at goal.  Continue current dose of statin and diet Rx.  No myalgias or side effects.  F/U  LFT's in 6 months. Lab Results  Component Value  Date   LDLCALC 89 11/29/2015   DM:  Discussed low carb diet.  Target hemoglobin A1c is 6.5 or less.  Continue current medications. Lab Results  Component Value Date   HGBA1C 6.6 07/03/2016    F/U with me in 6 months   Jenkins Rouge

## 2016-07-14 NOTE — Telephone Encounter (Signed)
Tier exception submitted to [facility].

## 2016-07-16 ENCOUNTER — Telehealth: Payer: Self-pay

## 2016-07-16 NOTE — Progress Notes (Signed)
Cardiologist contacted earlier this week to help this patient get a med or substitution

## 2016-07-16 NOTE — Telephone Encounter (Signed)
Xarelto approved for a Tier exception from [employer]. Reduced to a tier 1 co pay.

## 2016-07-22 ENCOUNTER — Ambulatory Visit (INDEPENDENT_AMBULATORY_CARE_PROVIDER_SITE_OTHER): Payer: Medicare HMO | Admitting: Cardiovascular Disease

## 2016-07-22 ENCOUNTER — Telehealth: Payer: Self-pay

## 2016-07-22 ENCOUNTER — Encounter: Payer: Self-pay | Admitting: Cardiovascular Disease

## 2016-07-22 VITALS — BP 130/72 | HR 67 | Ht 63.0 in | Wt 194.0 lb

## 2016-07-22 DIAGNOSIS — I480 Paroxysmal atrial fibrillation: Secondary | ICD-10-CM

## 2016-07-22 MED ORDER — RIVAROXABAN 20 MG PO TABS: 20.0000 mg | ORAL_TABLET | Freq: Every day | ORAL | 3 refills | Status: DC

## 2016-07-22 MED ORDER — FLECAINIDE ACETATE 100 MG PO TABS: 50.0000 mg | ORAL_TABLET | Freq: Two times a day (BID) | ORAL | 3 refills | Status: DC

## 2016-07-22 NOTE — Telephone Encounter (Signed)
Pa for lidocaine [id number] covermy meds key

## 2016-07-22 NOTE — Patient Instructions (Signed)

## 2016-07-23 NOTE — Telephone Encounter (Signed)
Form in [employer]

## 2016-08-02 DIAGNOSIS — E119 Type 2 diabetes mellitus without complications: Secondary | ICD-10-CM | POA: Diagnosis not present

## 2016-08-02 LAB — HM DIABETES EYE EXAM

## 2016-08-05 ENCOUNTER — Other Ambulatory Visit: Payer: Self-pay | Admitting: Physician Assistant

## 2016-08-05 ENCOUNTER — Other Ambulatory Visit: Payer: Self-pay | Admitting: Family Medicine

## 2016-08-09 ENCOUNTER — Telehealth: Payer: Self-pay | Admitting: Family Medicine

## 2016-08-09 MED ORDER — CLOTRIMAZOLE 2 % VA CREA: 1.0000 | TOPICAL_CREAM | Freq: Every day | VAGINAL | 2 refills | Status: DC

## 2016-08-09 NOTE — Telephone Encounter (Signed)
Pt calling for a RX for CLOTRIMaZOLE please call into [facility] on [address]

## 2016-08-17 NOTE — Telephone Encounter (Signed)
Signed and placed in nurse box - please help me complete utilizing the chart notes. Thanks.

## 2016-08-19 NOTE — Telephone Encounter (Signed)
Signed and faxed

## 2016-08-23 NOTE — Telephone Encounter (Signed)
Denied   We can appeal if desired  [phone] Please advise

## 2016-08-23 NOTE — Telephone Encounter (Signed)
Please let pt know.  She can use otc topical lidocaine gel or patches.

## 2016-08-24 NOTE — Telephone Encounter (Signed)
Mail box full, couldnot l/m

## 2016-08-27 NOTE — Telephone Encounter (Signed)
  Spoke with patient.  She will use the topical lidocaine gel.

## 2016-09-25 ENCOUNTER — Encounter (HOSPITAL_COMMUNITY): Payer: Self-pay | Admitting: *Deleted

## 2016-09-25 ENCOUNTER — Emergency Department (HOSPITAL_COMMUNITY)
Admission: EM | Admit: 2016-09-25 | Discharge: 2016-09-25 | Disposition: A | Discharge status: Home / Self Care | Payer: Medicare HMO | Attending: Emergency Medicine | Admitting: Emergency Medicine

## 2016-09-25 ENCOUNTER — Emergency Department (HOSPITAL_COMMUNITY): Payer: Medicare HMO

## 2016-09-25 DIAGNOSIS — Z7902 Long term (current) use of antithrombotics/antiplatelets: Secondary | ICD-10-CM | POA: Diagnosis not present

## 2016-09-25 DIAGNOSIS — M545 Low back pain, unspecified: Secondary | ICD-10-CM

## 2016-09-25 DIAGNOSIS — Z7984 Long term (current) use of oral hypoglycemic drugs: Secondary | ICD-10-CM | POA: Insufficient documentation

## 2016-09-25 DIAGNOSIS — N189 Chronic kidney disease, unspecified: Secondary | ICD-10-CM | POA: Diagnosis not present

## 2016-09-25 DIAGNOSIS — Y9301 Activity, walking, marching and hiking: Secondary | ICD-10-CM | POA: Insufficient documentation

## 2016-09-25 DIAGNOSIS — Z79899 Other long term (current) drug therapy: Secondary | ICD-10-CM | POA: Diagnosis not present

## 2016-09-25 DIAGNOSIS — W109XXA Fall (on) (from) unspecified stairs and steps, initial encounter: Secondary | ICD-10-CM | POA: Diagnosis not present

## 2016-09-25 DIAGNOSIS — M546 Pain in thoracic spine: Secondary | ICD-10-CM | POA: Diagnosis not present

## 2016-09-25 DIAGNOSIS — E1122 Type 2 diabetes mellitus with diabetic chronic kidney disease: Secondary | ICD-10-CM | POA: Diagnosis not present

## 2016-09-25 DIAGNOSIS — I129 Hypertensive chronic kidney disease with stage 1 through stage 4 chronic kidney disease, or unspecified chronic kidney disease: Secondary | ICD-10-CM | POA: Diagnosis not present

## 2016-09-25 DIAGNOSIS — Y999 Unspecified external cause status: Secondary | ICD-10-CM | POA: Diagnosis not present

## 2016-09-25 DIAGNOSIS — Y929 Unspecified place or not applicable: Secondary | ICD-10-CM | POA: Diagnosis not present

## 2016-09-25 NOTE — ED Triage Notes (Addendum)
Pt complains of lower back pain since a fall last week. Pt missed a few steps and fell down on her buttocks. Pt denies head injury or loss of consciousness. Pt took ibuprofen, which she states helped a little.

## 2016-09-25 NOTE — Discharge Instructions (Signed)
Please read attached information. If you experience any new or worsening signs or symptoms please return to the emergency room for evaluation. Please follow-up with your primary care provider or specialist as discussed. Please tylenol or ibuprofen only as directed and discontinue taking if you have any concerning signs or symptoms.

## 2016-09-25 NOTE — ED Provider Notes (Signed)
[facility]-EMERGENCY DEPT Provider Note   CSN: [id number] Arrival date & time: 09/25/16  1300    By signing my name below, I, Valentino Saxon, attest that this documentation has been prepared under the direction and in the presence of Eyvonne Mechanic, PA-C Electronically Signed: Valentino Saxon, ED Scribe. 09/25/16. 2:01 PM.  History   Chief Complaint Chief Complaint  Patient presents with  . Back Pain   The history is provided by the patient. No language interpreter was used.   HPI Comments: Catherine Joseph is a 76 y.o. female with PMHx of osteoporosis, HTN, DM who presents to the Emergency Department complaining of 8/10, gradual onset, constant, lower back pain s/p fall that occurred a week ago. Pt states she missed a couple of steps while walking down the stairs and fell down onto her buttocks. Pt denies head injury or LOC. Pt notes the right side of her back is worse than her left. She states her pain is exacerbated with bending. Pt report that yesterday she had difficulty making it to the bathroom fast enough before letting out urine, no difficulty with urination. Pt notes that since the fall she has had some pain that radiates from her back towards her abdomen. No abdominal tenderness, n/v/d or change in bowel habits. She reports taking ibuprofen at home with minimal relief. Pt denies numbness, weakness and tingling in her BLE.   Past Medical History:  Diagnosis Date  . Anemia   . Bradycardia   . Cataract   . Chronic kidney disease   . Diabetes mellitus type II   . Hypertension   . Osteoporosis   . Syncope   . Weakness     Patient Active Problem List   Diagnosis Date Noted  . Osteopenia 04/01/2016  . Microhematuria 11/11/2014  . Atrial fibrillation (HCC) 04/22/2014  . Anemia of chronic disease 04/10/2014  . Sepsis (HCC) 04/09/2014  . Intra-abdominal abscess (HCC) 04/09/2014  . Chest pain 02/11/2014  . 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 Lung nodule 02/01/2012  .  Mixed hyperlipidemia 08/03/2011  . Bradycardia 08/03/2011  . Dizzy spells 08/03/2011  . Hypertension 07/22/2011  . Diabetes mellitus, type II (HCC) 07/22/2011    Past Surgical History:  Procedure Laterality Date  . ABDOMINAL HYSTERECTOMY    . CESAREAN SECTION    . EYE SURGERY      OB History    No data available       Home Medications    Prior to Admission medications   Medication Sig Start Date End Date Taking? Authorizing Provider  Alcohol Swabs (B-D SINGLE USE SWABS REGULAR) PADS USE TO CHECK BLOOD SUGAR DAILY  08/06/16   Sherren Mocha, MD  Blood Glucose Calibration (SURECHEK CONTROL SOLUTION) Normal LIQD Use to check blood sugar daily. Dx E11.65 09/08/15   Sherren Mocha, MD  blood glucose meter kit and supplies Use to test blood sugar once daily. Dx: E11.65 07/09/15   Gwenlyn Found Copland, MD  Blood Glucose Monitoring Suppl (TRUE METRIX AIR GLUCOSE METER) DEVI 1 each by Does not apply route daily. Use to test blood sugar once daily. Dx: E11.65 09/09/15   Garnetta Buddy, PA  clotrimazole (GYNE-LOTRIMIN 3) 2 % vaginal cream Place 1 Applicatorful vaginally at bedtime. 08/09/16   Sherren Mocha, MD  flecainide (TAMBOCOR) 100 MG tablet Take 0.5 tablets (50 mg total) by mouth 2 (two) times daily. 07/22/16   Wendall Stade, MD  glipiZIDE (GLUCOTROL) 10 MG tablet TAKE 1 TABLET (10 MG  TOTAL) BY MOUTH 2 (TWO) TIMES DAILY BEFORE A MEAL. 05/29/16   Chelle Jeffery, PA-C  metFORMIN (GLUCOPHAGE-XR) 500 MG 24 hr tablet TAKE 2 TABLETS EVERY DAY WITH BREAKFAST 05/11/16   Shawnee Knapp, MD  PAZEO 0.7 % SOLN Place 1 drop into both eyes daily. 05/24/16   Historical Provider, MD  rivaroxaban (XARELTO) 20 MG TABS tablet Take 1 tablet (20 mg total) by mouth daily with supper. 07/22/16   Josue Hector, MD  simvastatin (ZOCOR) 20 MG tablet Take 1 tablet (20 mg total) by mouth daily. 07/03/16   Shawnee Knapp, MD  TRUE METRIX BLOOD GLUCOSE TEST test strip USE TO TEST BLOOD SUGAR ONCE DAILY. 08/06/16   Shawnee Knapp, MD  TRUEPLUS  LANCETS 28G MISC USE TO TEST BLOOD SUGAR ONCE DAILY.  08/06/16   Shawnee Knapp, MD  valsartan-hydrochlorothiazide (DIOVAN-HCT) 160-25 MG tablet Take 1 tablet by mouth daily. 07/03/16   Shawnee Knapp, MD    Family History Family History  Problem Relation Age of Onset  . Diabetes Sister   . Hyperlipidemia Sister   . Hypertension Sister   . Diabetes Brother   . Hyperlipidemia Brother   . Hypertension Brother     Social History Social History  Substance Use Topics  . Smoking status: Never Smoker  . Smokeless tobacco: Never Used  . Alcohol use No     Allergies   Codeine   Review of Systems Review of Systems  A complete 10 system review of systems was obtained and all systems are negative except as noted in the HPI and PMH.   Physical Exam Updated Vital Signs BP (!) 156/80 (BP Location: Left Arm)   Pulse 67   Temp 98 F (36.7 C) (Oral)   Resp 20   Ht '5\' 3"'$  (1.6 m)   Wt 76.9 kg   SpO2 99%   BMI 30.04 kg/m   Physical Exam  Constitutional: She appears well-developed and well-nourished.  HENT:  Head: Normocephalic and atraumatic.  Eyes: Conjunctivae are normal. Right eye exhibits no discharge. Left eye exhibits no discharge.  Pulmonary/Chest: Effort normal. No respiratory distress.  Abdominal: Soft. There is no tenderness.  Abdomen soft and nontender, no distention  Musculoskeletal:  Tenderness to palpation of the bilateral lower thoracic and lumbar soft tissue no signs of trauma no significant tenderness along the sacrum and coccyx  Patellar reflexes 2+ bilateral, sensation grossly intact, strength 5 out of 5, ambulance without significant difficulty-no saddle anesthesia  Neurological: She is alert. Coordination normal.  Skin: Skin is warm and dry. No rash noted. She is not diaphoretic. No erythema.  Psychiatric: She has a normal mood and affect.  Nursing note and vitals reviewed.    ED Treatments / Results   DIAGNOSTIC STUDIES: Oxygen Saturation is 99% on RA,  normal by my interpretation.    COORDINATION OF CARE: 1:37 PM Discussed treatment plan with pt at bedside which includes imaging and pt agreed to plan.   Labs (all labs ordered are listed, but only abnormal results are displayed) Labs Reviewed - No data to display  EKG  EKG Interpretation None       Radiology Dg Thoracic Spine 2 View  Result Date: 09/25/2016 CLINICAL DATA:  Low back pain since fall last week down steps. Right-sided low back pain worse than left. EXAM: THORACIC SPINE 2 VIEWS COMPARISON:  Chest x-ray 04/09/2014 FINDINGS: Vertebral body alignment and heights are within normal. There is mild to moderate spondylosis throughout the thoracic spine.  There is no compression fracture or subluxation. Pedicles are intact. Prominent right paravertebral osteophytes over the mid to lower thoracic spine. Minimal calcified plaque over the aortic arch. IMPRESSION: Mild to moderate spondylosis of the thoracic spine. No acute findings. Electronically Signed   By: Marin Olp M.D.   On: 09/25/2016 14:42   Dg Lumbar Spine Complete  Result Date: 09/25/2016 CLINICAL DATA:  Low back pain since fall last week right worse than left. EXAM: LUMBAR SPINE - COMPLETE 4+ VIEW COMPARISON:  06/09/2015 FINDINGS: Vertebral body alignment and heights are normal. Mild spondylosis of the lower lumbar spine most prominent at the L5-S1 level. Mild facet arthropathy over the lower lumbar spine. No definite acute compression fracture or subluxation. Mild degenerate change of the sacroiliac joints. Mild prominence of a air-filled small bowel loop in the left mid abdomen measuring 3.6 cm in diameter. Air is present throughout the colon. IMPRESSION: No acute findings. Mild spondylosis of the lower lumbar spine to include facet arthropathy. Air-filled mildly dilated small bowel loop over the left mid abdomen measuring 3.6 cm likely focal ileus. Electronically Signed   By: Marin Olp M.D.   On: 09/25/2016 14:46     Procedures Procedures (including critical care time)  Medications Ordered in ED Medications - No data to display   Initial Impression / Assessment and Plan / ED Course  I have reviewed the triage vital signs and the nursing notes.  Pertinent labs & imaging results that were available during my care of the patient were reviewed by me and considered in my medical decision making (see chart for details).     Labs:   Imaging: Thoracic and Lumbar Spine   Consults:  Therapeutics:  Discharge Meds:   Assessment/Plan: 76 year old female presents today with back pain status post fall.  She has minimal tenderness palpation, no significant bony abnormalities noted on her plain films.  Patient does report she had difficulty making it to the bathroom in time.  I have very low suspicion for cauda equina syndrome in this patient.  This fall occurred approximately 1 week ago, her urinary symptoms started yesterday.  She has no associated neurological deficits.  Patient care was discussed with attending physician.    Incidentally air-filled mildly dilated small bowel loop over the left mid abdomen with likely focal ileus was noted on plain films.  Patient has no abdominal tenderness to palpation, is in no significant pain at the time of my evaluation, is passing gas and having normal bowel movements.  These findings were read to the patient who would follow-up with primary care for repeat evaluation and ongoing management.  She is given strict return precautions.. Pt verbalized understanding and agreement to today's plan had no further questions or concerns at the time of discharge.    Final Clinical Impressions(s) / ED Diagnoses   Final diagnoses:  Acute bilateral low back pain without sciatica    New Prescriptions Discharge Medication List as of 09/25/2016  3:25 PM      I personally performed the services described in this documentation, which was scribed in my presence. The recorded  information has been reviewed and is accurate.     Okey Regal, PA-C 09/25/16 [place], MD 09/26/16 [phone]

## 2016-09-30 ENCOUNTER — Encounter: Payer: Self-pay | Admitting: Family Medicine

## 2016-09-30 ENCOUNTER — Ambulatory Visit (INDEPENDENT_AMBULATORY_CARE_PROVIDER_SITE_OTHER): Payer: Medicare HMO | Admitting: Family Medicine

## 2016-09-30 VITALS — BP 139/82 | HR 60 | Temp 97.8°F | Ht 63.0 in | Wt 190.8 lb

## 2016-09-30 DIAGNOSIS — M545 Low back pain, unspecified: Secondary | ICD-10-CM

## 2016-09-30 DIAGNOSIS — R32 Unspecified urinary incontinence: Secondary | ICD-10-CM | POA: Diagnosis not present

## 2016-09-30 DIAGNOSIS — I10 Essential (primary) hypertension: Secondary | ICD-10-CM | POA: Diagnosis not present

## 2016-09-30 DIAGNOSIS — B373 Candidiasis of vulva and vagina: Secondary | ICD-10-CM

## 2016-09-30 DIAGNOSIS — B3731 Acute candidiasis of vulva and vagina: Secondary | ICD-10-CM

## 2016-09-30 DIAGNOSIS — E1165 Type 2 diabetes mellitus with hyperglycemia: Secondary | ICD-10-CM | POA: Diagnosis not present

## 2016-09-30 DIAGNOSIS — D638 Anemia in other chronic diseases classified elsewhere: Secondary | ICD-10-CM

## 2016-09-30 DIAGNOSIS — E782 Mixed hyperlipidemia: Secondary | ICD-10-CM

## 2016-09-30 LAB — POCT URINALYSIS DIP (MANUAL ENTRY)
BILIRUBIN UA: NEGATIVE
Glucose, UA: NEGATIVE mg/dL
Nitrite, UA: NEGATIVE
PH UA: 5.5 (ref 5.0–8.0)
PROTEIN UA: NEGATIVE mg/dL
Spec Grav, UA: 1.02 (ref 1.010–1.025)
Urobilinogen, UA: 0.2 E.U./dL

## 2016-09-30 LAB — POCT GLYCOSYLATED HEMOGLOBIN (HGB A1C): HEMOGLOBIN A1C: 6.8

## 2016-09-30 MED ORDER — CLOTRIMAZOLE 2 % VA CREA: 1.0000 | TOPICAL_CREAM | Freq: Every day | VAGINAL | 2 refills | Status: DC

## 2016-09-30 MED ORDER — MELOXICAM 7.5 MG PO TABS: 7.5000 mg | ORAL_TABLET | Freq: Two times a day (BID) | ORAL | 0 refills | Status: DC | PRN

## 2016-09-30 MED ORDER — METFORMIN HCL ER 500 MG PO TB24: ORAL_TABLET | ORAL | 1 refills | Status: DC

## 2016-09-30 NOTE — Progress Notes (Addendum)
Subjective:    Patient ID: Catherine Joseph, female    DOB: 01/02/1941, 76 y.o.   MRN: [id number] Chief Complaint  Patient presents with  . Follow-up    6 mth f/u diabetes    HPI  DMII: On metformin and glipizide was having fasting cbgs still 100s-130s. See Dr. Shea Evans for reg eye exam. Nml microalb 08/2015. Her a1c was 9. 1 so we increased her metformin XR from 500 to 1000qd, cont on glipizide 10 bid with excellent reponse in a1c which decreased to 7.1. I meant to refer her to podiatry for DM shoes but I forgot to place at last apppt. Does not take asa since on xaretlo.  Last LDL 89 with non-hdl of 108 on simvastatin 20.  Her abdominal pain is now resolved. She does have decreased appetite and increase abdominal distention and flatulence. Patient requests a prescription for ibuprofen 800 mg  Microscopic hematuria: moderate blood on UA resolved at last visit  A, fib: On xarelto.  Sees Dr. Eden Emms - last saw him 07/22/2016 and f/u every 6 mos  Morning vertigo - self limited  HTN: On valsartan-hctz 160-25 qd. Does have a BP cuff at home to check pressures with.  CKD: baseline Cr 1.0 and has been at baseline  Osteopenia: needs dexa. Last done March 2015 showed osteopenia -1.7 in L spine.  She is originally from [place].  Depression screen [facility] 2/9 09/30/2016 09/30/2016 07/03/2016 04/01/2016 11/27/2015  Decreased Interest 0 0 0 0 0  Down, Depressed, Hopeless 0 0 0 0 0  PHQ - 2 Score 0 0 0 0 0   Past Medical History:  Diagnosis Date  . Anemia   . Bradycardia   . Cataract   . Chronic kidney disease   . Diabetes mellitus type II   . Hypertension   . Osteoporosis   . Syncope   . Weakness    Past Surgical History:  Procedure Laterality Date  . ABDOMINAL HYSTERECTOMY    . CESAREAN SECTION    . EYE SURGERY     Current Outpatient Prescriptions on File Prior to Visit  Medication Sig Dispense Refill  . Alcohol Swabs (B-D SINGLE USE SWABS REGULAR) PADS USE TO CHECK BLOOD SUGAR  DAILY  100 each 3  . Blood Glucose Calibration (SURECHEK CONTROL SOLUTION) Normal LIQD Use to check blood sugar daily. Dx E11.65 1 each 3  . blood glucose meter kit and supplies Use to test blood sugar once daily. Dx: E11.65 1 each 0  . Blood Glucose Monitoring Suppl (TRUE METRIX AIR GLUCOSE METER) DEVI 1 each by Does not apply route daily. Use to test blood sugar once daily. Dx: E11.65 1 Device 0  . flecainide (TAMBOCOR) 100 MG tablet Take 0.5 tablets (50 mg total) by mouth 2 (two) times daily. 180 tablet 3  . glipiZIDE (GLUCOTROL) 10 MG tablet TAKE 1 TABLET (10 MG TOTAL) BY MOUTH 2 (TWO) TIMES DAILY BEFORE A MEAL. 180 tablet 2  . PAZEO 0.7 % SOLN Place 1 drop into both eyes daily.  11  . rivaroxaban (XARELTO) 20 MG TABS tablet Take 1 tablet (20 mg total) by mouth daily with supper. 90 tablet 3  . simvastatin (ZOCOR) 20 MG tablet Take 1 tablet (20 mg total) by mouth daily. 90 tablet 1  . TRUE METRIX BLOOD GLUCOSE TEST test strip USE TO TEST BLOOD SUGAR ONCE DAILY. 100 each 3  . TRUEPLUS LANCETS 28G MISC USE TO TEST BLOOD SUGAR ONCE DAILY.  100 each 3  .  valsartan-hydrochlorothiazide (DIOVAN-HCT) 160-25 MG tablet Take 1 tablet by mouth daily. 90 tablet 1   No current facility-administered medications on file prior to visit.    Allergies  Allergen Reactions  . Codeine Other (See Comments)    vertigo   Family History  Problem Relation Age of Onset  . Diabetes Sister   . Hyperlipidemia Sister   . Hypertension Sister   . Diabetes Brother   . Hyperlipidemia Brother   . Hypertension Brother    Social History   Social History  . Marital status: Widowed    Spouse name: N/A  . Number of children: N/A  . Years of education: N/A   Social History Main Topics  . Smoking status: Never Smoker  . Smokeless tobacco: Never Used  . Alcohol use No  . Drug use: No  . Sexual activity: Not Asked   Other Topics Concern  . None   Social History Narrative  . None     Review of Systems    Constitutional: Positive for activity change and appetite change (Decreased). Negative for chills and fever.  Gastrointestinal: Positive for abdominal distention. Negative for abdominal pain, constipation, diarrhea, nausea and vomiting.  Genitourinary: Positive for enuresis, frequency and urgency. Negative for decreased urine volume, difficulty urinating and flank pain.  Musculoskeletal: Positive for arthralgias, back pain and myalgias. Negative for gait problem and joint swelling.  Skin: Negative for color change and rash.  Neurological: Negative for weakness and numbness.  Hematological: Negative for adenopathy. Does not bruise/bleed easily.  Psychiatric/Behavioral: Negative for dysphoric mood. The patient is not nervous/anxious.       Objective:   Physical Exam  Constitutional: She is oriented to person, place, and time. She appears well-developed and well-nourished. No distress.  HENT:  Head: Normocephalic and atraumatic.  Right Ear: External ear normal.  Left Ear: External ear normal.  Eyes: Conjunctivae are normal. No scleral icterus.  Neck: Normal range of motion. Neck supple. No thyromegaly present.  Cardiovascular: Normal rate, regular rhythm, normal heart sounds and intact distal pulses.   Pulmonary/Chest: Effort normal and breath sounds normal. No respiratory distress.  Abdominal: Normal appearance and bowel sounds are normal. She exhibits distension (mild). There is generalized tenderness. There is no rigidity, no rebound, no guarding and no CVA tenderness.  Musculoskeletal: She exhibits no edema.       Lumbar back: She exhibits decreased range of motion, bony tenderness, pain and spasm.  ttp over low lumbar spinous process, not sig tender over paraspinal muscles, Si joints  Lymphadenopathy:    She has no cervical adenopathy.  Neurological: She is alert and oriented to person, place, and time.  Skin: Skin is warm and dry. She is not diaphoretic. No erythema.  Psychiatric:  She has a normal mood and affect. Her behavior is normal.      BP 139/82 (BP Location: Left Arm, Patient Position: Sitting, Cuff Size: Small)   Pulse 60   Temp 97.8 F (36.6 C) (Oral)   Ht '5\' 3"'$  (1.6 m)   Wt 190 lb 12.8 oz (86.5 kg)   SpO2 97%   BMI 33.80 kg/m   Results for orders placed or performed in visit on 09/30/16  POCT glycosylated hemoglobin (Hb A1C)  Result Value Ref Range   Hemoglobin A1C 6.8   POCT urinalysis dipstick  Result Value Ref Range   Color, UA yellow yellow   Clarity, UA clear clear   Glucose, UA negative negative mg/dL   Bilirubin, UA negative negative   Ketones,  POC UA trace (5) (A) negative mg/dL   Spec Grav, UA 1.020 1.010 - 1.025   Blood, UA moderate (A) negative   pH, UA 5.5 5.0 - 8.0   Protein Ur, POC negative negative mg/dL   Urobilinogen, UA 0.2 0.2 or 1.0 E.U./dL   Nitrite, UA Negative Negative   Leukocytes, UA Small (1+) (A) Negative    Assessment & Plan:  Ok to refill glipizide, zocor, diovan-hct x 3 mos whenever requested  1. Type 2 diabetes mellitus with hyperglycemia, without long-term current use of insulin (HCC) - stable. A1c 6.8. Continue current regimen   2. Essential hypertension   3. Mixed hyperlipidemia   4. Anemia of chronic disease   5. Acute midline low back pain without sciatica - advised against use of high-dose ibuprofen and other OTC NSAIDs due to recent complaints of abdominal pain (now resolved) and bowel gas. Try meloxicam instead   6. Urinary incontinence, unspecified type   7. Yeast vaginitis     Orders Placed This Encounter  Procedures  . Urine culture  . Comprehensive metabolic panel  . CBC  . Microalbumin/Creatinine Ratio, Urine  . Lipase  . POCT glycosylated hemoglobin (Hb A1C)  . POCT urinalysis dipstick    Meds ordered this encounter  Medications  . meloxicam (MOBIC) 7.5 MG tablet    Sig: Take 1 tablet (7.5 mg total) by mouth 2 (two) times daily as needed for pain.    Dispense:  60 tablet     Refill:  0  . clotrimazole (GYNE-LOTRIMIN 3) 2 % vaginal cream    Sig: Place 1 Applicatorful vaginally at bedtime.    Dispense:  21 g    Refill:  2  . metFORMIN (GLUCOPHAGE-XR) 500 MG 24 hr tablet    Sig: TAKE 2 TABLETS EVERY DAY WITH BREAKFAST    Dispense:  180 tablet    Refill:  1       Delman Cheadle, M.D.  Primary Care at [facility] [address] [place],  [address] [phone] phone [phone] fax  09/30/16 7:44 PM

## 2016-09-30 NOTE — Patient Instructions (Addendum)
Do not use with any other otc pain medication other than tylenol/acetaminophen - so no aleve, ibuprofen, motrin, advil, etc.     IF you received an x-ray today, you will receive an invoice from [facility]. Please contact [facility] at [phone] with questions or concerns regarding your invoice.   IF you received labwork today, you will receive an invoice from [facility]. Please contact [facility] at [phone] with questions or concerns regarding your invoice.   Our billing staff will not be able to assist you with questions regarding bills from these companies.  You will be contacted with the lab results as soon as they are available. The fastest way to get your results is to activate your My Chart account. Instructions are located on the last page of this paperwork. If you have not heard from [place] regarding the results in 2 weeks, please contact this office.    Low-Fiber Diet Fiber is found in fruits, vegetables, and whole grains. A low-fiber diet restricts fibrous foods that are not digested in the small intestine. A diet containing about 10-15 grams of fiber per day is considered low fiber. Low-fiber diets may be used to:  Promote healing and rest the bowel during intestinal flare-ups.  Prevent blockage of a partially obstructed or narrowed gastrointestinal tract.  Reduce fecal weight and volume.  Slow the movement of feces. You may be on a low-fiber diet as a transitional diet following surgery, after an injury (trauma), or because of a short (acute) or lifelong (chronic) illness. Your health care provider will determine the length of time you need to stay on this diet. What do I need to know about a low-fiber diet? Always check the fiber content on the packaging's Nutrition Facts label, especially on foods from the grains list. Ask your dietitian if you have questions about specific foods that are related to your condition, especially if the food is not listed  below. In general, a low-fiber food will have less than 2 g of fiber. What foods can I eat? Grains  All breads and crackers made with white flour. Sweet rolls, doughnuts, waffles, pancakes, [place] toast, bagels. Pretzels, Melba toast, zwieback. Well-cooked cereals, such as cornmeal, farina, or cream cereals. Dry cereals that do not contain whole grains, fruit, or nuts, such as refined corn, wheat, rice, and oat cereals. Potatoes prepared any way without skins, plain pastas and noodles, refined white rice. Use white flour for baking and making sauces. Use allowed list of grains for casseroles, dumplings, and puddings. Vegetables  Strained tomato and vegetable juices. Fresh lettuce, cucumber, spinach. Well-cooked (no skin or pulp) or canned vegetables, such as asparagus, bean sprouts, beets, carrots, green beans, mushrooms, potatoes, pumpkin, spinach, yellow squash, tomato sauce/puree, turnips, yams, and zucchini. Keep servings limited to  cup. Fruits  All fruit juices except prune juice. Cooked or canned fruits without skin and seeds, such as applesauce, apricots, cherries, fruit cocktail, grapefruit, grapes, mandarin oranges, melons, peaches, pears, pineapple, and plums. Fresh fruits without skin, such as apricots, avocados, bananas, melons, pineapple, nectarines, and peaches. Keep servings limited to  cup or 1 piece. Meat and Other Protein Sources  Ground or well-cooked tender beef, ham, veal, lamb, pork, or poultry. Eggs, plain cheese. Fish, oysters, shrimp, lobster, and other seafood. Liver, organ meats. Smooth nut butters. Dairy  All milk products and alternative dairy substitutes, such as soy, rice, almond, and coconut, not containing added whole nuts, seeds, or added fruit. Beverages  Decaf coffee, fruit, and vegetable juices or smoothies (  small amounts, with no pulp or skins, and with fruits from allowed list), sports drinks, herbal tea. Condiments  Ketchup, mustard, vinegar, cream sauce,  cheese sauce, cocoa powder. Spices in moderation, such as allspice, basil, bay leaves, celery powder or leaves, cinnamon, cumin powder, curry powder, ginger, mace, marjoram, onion or garlic powder, oregano, paprika, parsley flakes, ground pepper, rosemary, sage, savory, tarragon, thyme, and turmeric. Sweets and Desserts  Plain cakes and cookies, pie made with allowed fruit, pudding, custard, cream pie. Gelatin, fruit, ice, sherbet, frozen ice pops. Ice cream, ice milk without nuts. Plain hard candy, honey, jelly, molasses, syrup, sugar, chocolate syrup, gumdrops, marshmallows. Limit overall sugar intake. Fats and Oil  Margarine, butter, cream, mayonnaise, salad oils, plain salad dressings made from allowed foods. Choose healthy fats such as olive oil, canola oil, and omega-3 fatty acids (such as found in salmon or tuna) when possible. Other  Bouillon, broth, or cream soups made from allowed foods. Any strained soup. Casseroles or mixed dishes made with allowed foods. The items listed above may not be a complete list of recommended foods or beverages. Contact your dietitian for more options.  What foods are not recommended? Grains  All whole wheat and whole grain breads and crackers. Multigrains, rye, bran seeds, nuts, or coconut. Cereals containing whole grains, multigrains, bran, coconut, nuts, raisins. Cooked or dry oatmeal, steel-cut oats. Coarse wheat cereals, granola. Cereals advertised as high fiber. Potato skins. Whole grain pasta, wild or brown rice. Popcorn. Coconut flour. Bran, buckwheat, corn bread, multigrains, rye, wheat germ. Vegetables  Fresh, cooked or canned vegetables, such as artichokes, asparagus, beet greens, broccoli, Brussels sprouts, cabbage, celery, cauliflower, corn, eggplant, kale, legumes or beans, okra, peas, and tomatoes. Avoid large servings of any vegetables, especially raw vegetables. Fruits  Fresh fruits, such as apples with or without skin, berries, cherries, figs,  grapes, grapefruit, guavas, kiwis, mangoes, oranges, papayas, pears, persimmons, pineapple, and pomegranate. Prune juice and juices with pulp, stewed or dried prunes. Dried fruits, dates, raisins. Fruit seeds or skins. Avoid large servings of all fresh fruits. Meats and Other Protein Sources  Tough, fibrous meats with gristle. Chunky nut butter. Cheese made with seeds, nuts, or other foods not recommended. Nuts, seeds, legumes (beans, including baked beans), dried peas, beans, lentils. Dairy  Yogurt or cheese that contains nuts, seeds, or added fruit. Beverages  Fruit juices with high pulp, prune juice. Caffeinated coffee and teas. Condiments  Coconut, maple syrup, pickles, olives. Sweets and Desserts  Desserts, cookies, or candies that contain nuts or coconut, chunky peanut butter, dried fruits. Jams, preserves with seeds, marmalade. Large amounts of sugar and sweets. Any other dessert made with fruits from the not recommended list. Other  Soups made from vegetables that are not recommended or that contain other foods not recommended. The items listed above may not be a complete list of foods and beverages to avoid. Contact your dietitian for more information.  This information is not intended to replace advice given to you by your health care provider. Make sure you discuss any questions you have with your health care provider. Document Released: 11/27/2001 Document Revised: 11/13/2015 Document Reviewed: 04/30/2013 Elsevier Interactive Patient Education  2017 [place]. Ileus Ileus is a condition in which the intestines, also called the bowels, stop working and moving correctly. If the intestines stop working, food cannot pass through to get digested. The intestines are hollow organs that digest food after the food leaves the stomach. These organs are long, muscular tubes that connect the stomach  to the rectum. When ileus occurs, the muscular contractions that cause food to move through the  intestines stop happening as they normally would. Ileus can occur for various reasons. This condition is a serious problem that usually requires hospitalization. It can cause symptoms such as nausea, abdominal pain, and bloating. Ileus can last from a few hours to a few days. If the intestines stop working because of a blockage, that is a different condition that is called a bowel obstruction. What are the causes? This condition may be caused by:  Surgery on the abdomen.  An infection or inflammation in the abdomen. This includes inflammation of the lining of the abdomen (peritonitis).  Infection or inflammation in other parts of the body, such as pneumonia or pancreatitis.  Passage of gallstones or kidney stones.  Damage to the nerves or blood vessels that go to the intestines.  A collection of blood within the abdominal cavity.  Imbalance in the salts in the blood (electrolytes).  Injury to the brain or spinal cord.  Medicines. Many medicines, including strong pain medicines, can cause ileus or make it worse. What are the signs or symptoms? Symptoms of this condition include:  Bloating of the abdomen.  Pain or discomfort in the abdomen.  Poor appetite.  Nausea and vomiting.  Lack of normal bowel sounds, such as "growling" in the stomach. How is this diagnosed? This condition may be diagnosed with:  A physical exam and medical history.  X-rays or a CT scan of the abdomen. You may also have other tests to help find the cause of the condition. How is this treated? Treatment for this condition may include:  Resting the intestines until they start to work again. This is often done by:  Stopping oral intake of food and drink. You will be given fluid through an IV tube to prevent dehydration.  Placing a small tube (nasogastric tube or NG tube) that is passed through your nose and into your stomach. The tube is attached to a suction device and keeps the stomach emptied out.  This allows the bowels to rest and also helps to reduce nausea and vomiting.  Correcting any electrolyte imbalance by giving supplements in the IV fluid.  Stopping any medicines that might make ileus worse.  Treating any condition that may have caused ileus. Follow these instructions at home:  Follow instructions from your health care provider about diet and fluid intake. Usually, you will be told to:  Drink plenty of clear fluids.  Avoid alcohol.  Avoid caffeine.  Eat a bland diet.  Get plenty of rest. Return to your normal activities as told by your health care provider.  Take over-the-counter and prescription medicines only as told by your health care provider.  Keep all follow-up visits as told by your health care provider. This is important. Contact a health care provider if:  You have nausea, vomiting, or abdominal discomfort.  You have a fever. Get help right away if:  You have severe abdominal pain or bloating.  You cannot eat or drink without vomiting. This information is not intended to replace advice given to you by your health care provider. Make sure you discuss any questions you have with your health care provider. Document Released: 06/10/2003 Document Revised: 11/13/2015 Document Reviewed: 08/01/2014 Elsevier Interactive Patient Education  2017 [employer].

## 2016-10-01 LAB — LIPASE: Lipase: 43 U/L (ref 14–85)

## 2016-10-01 LAB — URINE CULTURE: ORGANISM ID, BACTERIA: NO GROWTH

## 2016-10-01 NOTE — Assessment & Plan Note (Signed)
See Dr. Idolina Primer for annual eye exam.  Nml microalb 08/2015 on arb. On statin. Not on asa as using xarelto. On metformin XR 1000mg  qd and glipizide 10 bid with a1c well-controlled. Lab Results  Component Value Date   HGBA1C 6.8 09/30/2016   HGBA1C 6.6 07/03/2016   HGBA1C 6.5 04/01/2016

## 2016-10-02 LAB — CBC
HEMOGLOBIN: 13.1 g/dL (ref 11.1–15.9)
Hematocrit: 40 % (ref 34.0–46.6)
MCH: 27.1 pg (ref 26.6–33.0)
MCHC: 32.8 g/dL (ref 31.5–35.7)
MCV: 83 fL (ref 79–97)
PLATELETS: 265 10*3/uL (ref 150–379)
RBC: 4.84 x10E6/uL (ref 3.77–5.28)
RDW: 14.4 % (ref 12.3–15.4)
WBC: 6.1 10*3/uL (ref 3.4–10.8)

## 2016-10-02 LAB — COMPREHENSIVE METABOLIC PANEL
ALK PHOS: 121 IU/L — AB (ref 39–117)
ALT: 19 IU/L (ref 0–32)
AST: 17 IU/L (ref 0–40)
Albumin/Globulin Ratio: 1.2 (ref 1.2–2.2)
Albumin: 4.1 g/dL (ref 3.5–4.8)
BUN/Creatinine Ratio: 22 (ref 12–28)
BUN: 20 mg/dL (ref 8–27)
Bilirubin Total: <0.2 mg/dL (ref 0.0–1.2)
CALCIUM: 9.7 mg/dL (ref 8.7–10.3)
CO2: 27 mmol/L (ref 18–29)
CREATININE: 0.89 mg/dL (ref 0.57–1.00)
Chloride: 97 mmol/L (ref 96–106)
GFR calc Af Amer: 72 mL/min/{1.73_m2} (ref >59)
GFR, EST NON AFRICAN AMERICAN: 63 mL/min/{1.73_m2} (ref >59)
GLOBULIN, TOTAL: 3.5 g/dL (ref 1.5–4.5)
Glucose: 82 mg/dL (ref 65–99)
Potassium: 4.2 mmol/L (ref 3.5–5.2)
SODIUM: 140 mmol/L (ref 134–144)
Total Protein: 7.6 g/dL (ref 6.0–8.5)

## 2016-10-02 LAB — MICROALBUMIN / CREATININE URINE RATIO
CREATININE, UR: 161.9 mg/dL
MICROALB/CREAT RATIO: 9 mg/g{creat} (ref 0.0–30.0)
Microalbumin, Urine: 14.5 ug/mL

## 2016-10-12 NOTE — Addendum Note (Signed)
Addended by: Delman Cheadle on: 10/12/2016 12:51 AM   Modules accepted: Orders

## 2016-11-02 ENCOUNTER — Other Ambulatory Visit: Payer: Self-pay | Admitting: Family Medicine

## 2016-11-04 NOTE — Telephone Encounter (Signed)
09/30/16 last refill and ov

## 2016-11-06 NOTE — Telephone Encounter (Signed)
Normal renal fxn. Has f/u OV in August

## 2016-11-19 ENCOUNTER — Ambulatory Visit (INDEPENDENT_AMBULATORY_CARE_PROVIDER_SITE_OTHER): Payer: Medicare HMO

## 2016-11-19 ENCOUNTER — Encounter: Payer: Self-pay | Admitting: Family Medicine

## 2016-11-19 ENCOUNTER — Ambulatory Visit (INDEPENDENT_AMBULATORY_CARE_PROVIDER_SITE_OTHER): Payer: Medicare HMO | Admitting: Family Medicine

## 2016-11-19 DIAGNOSIS — M545 Low back pain, unspecified: Secondary | ICD-10-CM

## 2016-11-19 DIAGNOSIS — R32 Unspecified urinary incontinence: Secondary | ICD-10-CM

## 2016-11-19 DIAGNOSIS — R0781 Pleurodynia: Secondary | ICD-10-CM | POA: Diagnosis not present

## 2016-11-19 DIAGNOSIS — I10 Essential (primary) hypertension: Secondary | ICD-10-CM | POA: Diagnosis not present

## 2016-11-19 DIAGNOSIS — R109 Unspecified abdominal pain: Secondary | ICD-10-CM

## 2016-11-19 DIAGNOSIS — R319 Hematuria, unspecified: Secondary | ICD-10-CM | POA: Diagnosis not present

## 2016-11-19 DIAGNOSIS — R252 Cramp and spasm: Secondary | ICD-10-CM | POA: Diagnosis not present

## 2016-11-19 DIAGNOSIS — S299XXA Unspecified injury of thorax, initial encounter: Secondary | ICD-10-CM | POA: Diagnosis not present

## 2016-11-19 LAB — POC MICROSCOPIC URINALYSIS (UMFC): Mucus: ABSENT

## 2016-11-19 LAB — POCT CBC
Granulocyte percent: 40.5 %G (ref 37–80)
HEMATOCRIT: 41 % (ref 37.7–47.9)
HEMOGLOBIN: 13.8 g/dL (ref 12.2–16.2)
LYMPH, POC: 2.3 (ref 0.6–3.4)
MCH: 27.6 pg (ref 27–31.2)
MCHC: 33.6 g/dL (ref 31.8–35.4)
MCV: 82.1 fL (ref 80–97)
MID (CBC): 0.2 (ref 0–0.9)
MPV: 8.4 fL (ref 0–99.8)
POC Granulocyte: 1.7 — AB (ref 2–6.9)
POC LYMPH PERCENT: 54.3 %L — AB (ref 10–50)
POC MID %: 5.2 % (ref 0–12)
Platelet Count, POC: 180 10*3/uL (ref 142–424)
RBC: 4.99 M/uL (ref 4.04–5.48)
RDW, POC: 15.3 %
WBC: 4.3 10*3/uL — AB (ref 4.6–10.2)

## 2016-11-19 LAB — POCT URINALYSIS DIP (MANUAL ENTRY)
BILIRUBIN UA: NEGATIVE
Glucose, UA: NEGATIVE mg/dL
Ketones, POC UA: NEGATIVE mg/dL
Leukocytes, UA: NEGATIVE
Nitrite, UA: NEGATIVE
PH UA: 6 (ref 5.0–8.0)
Protein Ur, POC: NEGATIVE mg/dL
SPEC GRAV UA: 1.01 (ref 1.010–1.025)
Urobilinogen, UA: 0.2 E.U./dL

## 2016-11-19 LAB — POCT SEDIMENTATION RATE: POCT SED RATE: 37 mm/hr — AB (ref 0–22)

## 2016-11-19 MED ORDER — VALSARTAN-HYDROCHLOROTHIAZIDE 320-25 MG PO TABS: 1.0000 | ORAL_TABLET | Freq: Every day | ORAL | 1 refills | Status: DC

## 2016-11-19 MED ORDER — POLYETHYLENE GLYCOL 3350 17 GM/SCOOP PO POWD: 17.0000 g | Freq: Two times a day (BID) | ORAL | 1 refills | Status: DC | PRN

## 2016-11-19 MED ORDER — TIZANIDINE HCL 2 MG PO TABS: 2.0000 mg | ORAL_TABLET | Freq: Every day | ORAL | 0 refills | Status: DC

## 2016-11-19 MED ORDER — ACETAMINOPHEN 500 MG PO TABS: 500.0000 mg | ORAL_TABLET | Freq: Four times a day (QID) | ORAL | 0 refills | Status: AC | PRN

## 2016-11-19 NOTE — Patient Instructions (Signed)
Low-Fiber Diet °Fiber is found in fruits, vegetables, and whole grains. A low-fiber diet restricts fibrous foods that are not digested in the small intestine. A diet containing about 10-15 grams of fiber per day is considered low fiber. Low-fiber diets may be used to: °· Promote healing and rest the bowel during intestinal flare-ups. °· Prevent blockage of a partially obstructed or narrowed gastrointestinal tract. °· Reduce fecal weight and volume. °· Slow the movement of feces. ° °You may be on a low-fiber diet as a transitional diet following surgery, after an injury (trauma), or because of a short (acute) or lifelong (chronic) illness. Your health care provider will determine the length of time you need to stay on this diet. °What do I need to know about a low-fiber diet? °Always check the fiber content on the packaging's Nutrition Facts label, especially on foods from the grains list. Ask your dietitian if you have questions about specific foods that are related to your condition, especially if the food is not listed below. In general, a low-fiber food will have less than 2 g of fiber. °What foods can I eat? °Grains °All breads and crackers made with white flour. Sweet rolls, doughnuts, waffles, pancakes, French toast, bagels. Pretzels, Melba toast, zwieback. Well-cooked cereals, such as cornmeal, farina, or cream cereals. Dry cereals that do not contain whole grains, fruit, or nuts, such as refined corn, wheat, rice, and oat cereals. Potatoes prepared any way without skins, plain pastas and noodles, refined white rice. Use white flour for baking and making sauces. Use allowed list of grains for casseroles, dumplings, and puddings. °Vegetables °Strained tomato and vegetable juices. Fresh lettuce, cucumber, spinach. Well-cooked (no skin or pulp) or canned vegetables, such as asparagus, bean sprouts, beets, carrots, green beans, mushrooms, potatoes, pumpkin, spinach, yellow squash, tomato sauce/puree, turnips,  yams, and zucchini. Keep servings limited to ½ cup. °Fruits °All fruit juices except prune juice. Cooked or canned fruits without skin and seeds, such as applesauce, apricots, cherries, fruit cocktail, grapefruit, grapes, mandarin oranges, melons, peaches, pears, pineapple, and plums. Fresh fruits without skin, such as apricots, avocados, bananas, melons, pineapple, nectarines, and peaches. Keep servings limited to ½ cup or 1 piece. °Meat and Other Protein Sources °Ground or well-cooked tender beef, ham, veal, lamb, pork, or poultry. Eggs, plain cheese. Fish, oysters, shrimp, lobster, and other seafood. Liver, organ meats. Smooth nut butters. °Dairy °All milk products and alternative dairy substitutes, such as soy, rice, almond, and coconut, not containing added whole nuts, seeds, or added fruit. °Beverages °Decaf coffee, fruit, and vegetable juices or smoothies (small amounts, with no pulp or skins, and with fruits from allowed list), sports drinks, herbal tea. °Condiments °Ketchup, mustard, vinegar, cream sauce, cheese sauce, cocoa powder. Spices in moderation, such as allspice, basil, bay leaves, celery powder or leaves, cinnamon, cumin powder, curry powder, ginger, mace, marjoram, onion or garlic powder, oregano, paprika, parsley flakes, ground pepper, rosemary, sage, savory, tarragon, thyme, and turmeric. °Sweets and Desserts °Plain cakes and cookies, pie made with allowed fruit, pudding, custard, cream pie. Gelatin, fruit, ice, sherbet, frozen ice pops. Ice cream, ice milk without nuts. Plain hard candy, honey, jelly, molasses, syrup, sugar, chocolate syrup, gumdrops, marshmallows. Limit overall sugar intake. °Fats and Oil °Margarine, butter, cream, mayonnaise, salad oils, plain salad dressings made from allowed foods. Choose healthy fats such as olive oil, canola oil, and omega-3 fatty acids (such as found in salmon or tuna) when possible. °Other °Bouillon, broth, or cream soups made from allowed foods. Any    strained soup. Casseroles or mixed dishes made with allowed foods. °The items listed above may not be a complete list of recommended foods or beverages. Contact your dietitian for more options. °What foods are not recommended? °Grains °All whole wheat and whole grain breads and crackers. Multigrains, rye, bran seeds, nuts, or coconut. Cereals containing whole grains, multigrains, bran, coconut, nuts, raisins. Cooked or dry oatmeal, steel-cut oats. Coarse wheat cereals, granola. Cereals advertised as high fiber. Potato skins. Whole grain pasta, wild or brown rice. Popcorn. Coconut flour. Bran, buckwheat, corn bread, multigrains, rye, wheat germ. °Vegetables °Fresh, cooked or canned vegetables, such as artichokes, asparagus, beet greens, broccoli, Brussels sprouts, cabbage, celery, cauliflower, corn, eggplant, kale, legumes or beans, okra, peas, and tomatoes. Avoid large servings of any vegetables, especially raw vegetables. °Fruits °Fresh fruits, such as apples with or without skin, berries, cherries, figs, grapes, grapefruit, guavas, kiwis, mangoes, oranges, papayas, pears, persimmons, pineapple, and pomegranate. Prune juice and juices with pulp, stewed or dried prunes. Dried fruits, dates, raisins. Fruit seeds or skins. Avoid large servings of all fresh fruits. °Meats and Other Protein Sources °Tough, fibrous meats with gristle. Chunky nut butter. Cheese made with seeds, nuts, or other foods not recommended. Nuts, seeds, legumes (beans, including baked beans), dried peas, beans, lentils. °Dairy °Yogurt or cheese that contains nuts, seeds, or added fruit. °Beverages °Fruit juices with high pulp, prune juice. Caffeinated coffee and teas. °Condiments °Coconut, maple syrup, pickles, olives. °Sweets and Desserts °Desserts, cookies, or candies that contain nuts or coconut, chunky peanut butter, dried fruits. Jams, preserves with seeds, marmalade. Large amounts of sugar and sweets. Any other dessert made with fruits from  the not recommended list. °Other °Soups made from vegetables that are not recommended or that contain other foods not recommended. °The items listed above may not be a complete list of foods and beverages to avoid. Contact your dietitian for more information. °This information is not intended to replace advice given to you by your health care provider. Make sure you discuss any questions you have with your health care provider. °Document Released: 11/27/2001 Document Revised: 11/13/2015 Document Reviewed: 04/30/2013 °Elsevier Interactive Patient Education © 2017 [employer] ° °

## 2016-11-19 NOTE — Progress Notes (Addendum)
Subjective:  By signing my name below, I, Catherine Joseph, attest that this documentation has been prepared under the direction and in the presence of Delman Cheadle, MD Electronically Signed: Ladene Joseph, ED Scribe 11/19/2016 at 11:40 AM.   Patient ID: Catherine Joseph, female    DOB: 04-Feb-1941, 76 y.o.   MRN: [id number]  Chief Complaint  Patient presents with  . Hypertension    per patient, blood pressure readings have been high at home  . Back Pain    after fall 2 months ago   HPI Catherine Joseph is a 76 y.o. female who presents to Primary Care at [facility] complaining of elevated BP. Last seen 6 weeks prior. DMII: On metformin and glipizide was having fasting cbgs still 100s-130s. See Dr. Idolina Primer for reg eye exam. Nml microalb 08/2015. Her a1c was 9. 1 so we increased her metformin XR from 500 to 1000qd, cont on glipizide 10 bid with excellent reponse in a1c which decreased to 7.1>6.8. I meant to refer her to podiatry for DM shoes but I forgot to place at last appt. Does not take asa since on xaretlo. Last LDL 89 with non-hdl of 108 on simvastatin 20.  Microscopic hematuria: moderate blood onUA resolved in February. And recurred at last visit 6 weeks prior.   A, fib: On xarelto. Sees Dr. Johnsie Cancel - last saw him 07/22/2016 and f/u every 6 mos  Morning vertigo - self limited  HTN: On valsartan-hctz 160-25 qd. Does have a BP cuff at home to check pressures with. Pt states that she checked her BP in the office with her own machine and noted a reading of 198/115.   CKD: baseline Cr 1.0 and has been at baseline  Osteopenia: needs dexa. Last done March 2015 showed osteopenia -1.7 in L spine.  Low Back Pain: Started trial of Meloxicam which she states did not provide significant relief. Pt reports a fall that occurred 2 months ago. She was seen in the ER on 4/7. She missed a few steps while walking down stairs and fell on buttocks on 4/1. She reports the intermittent right flank pain and  central upper lumbar pain that is exacerbated at night with lying. Pt states that pain is improved with standing. She reports associated symptoms of urinary urgency with leaking only while lying down since her fall and hand cramps x 1 month. No h/o kidney stones, pain radiating into lower extremities, constipation, diarrhea, bowel incontinence, cough, sob.   She is originally from [place].   Past Medical History:  Diagnosis Date  . Anemia   . Bradycardia   . Cataract   . Chronic kidney disease   . Diabetes mellitus type II   . Hypertension   . Osteoporosis   . Syncope   . Weakness    Current Outpatient Prescriptions on File Prior to Visit  Medication Sig Dispense Refill  . Alcohol Swabs (B-D SINGLE USE SWABS REGULAR) PADS USE TO CHECK BLOOD SUGAR DAILY  100 each 3  . Blood Glucose Calibration (SURECHEK CONTROL SOLUTION) Normal LIQD Use to check blood sugar daily. Dx E11.65 1 each 3  . blood glucose meter kit and supplies Use to test blood sugar once daily. Dx: E11.65 1 each 0  . Blood Glucose Monitoring Suppl (TRUE METRIX AIR GLUCOSE METER) DEVI 1 each by Does not apply route daily. Use to test blood sugar once daily. Dx: E11.65 1 Device 0  . clotrimazole (GYNE-LOTRIMIN 3) 2 % vaginal cream Place 1 Applicatorful vaginally at  bedtime. 21 g 2  . flecainide (TAMBOCOR) 100 MG tablet Take 0.5 tablets (50 mg total) by mouth 2 (two) times daily. 180 tablet 3  . glipiZIDE (GLUCOTROL) 10 MG tablet TAKE 1 TABLET (10 MG TOTAL) BY MOUTH 2 (TWO) TIMES DAILY BEFORE A MEAL. 180 tablet 2  . meloxicam (MOBIC) 7.5 MG tablet TAKE 1 TABLET(7.5 MG) BY MOUTH TWICE DAILY AS NEEDED FOR PAIN 60 tablet 1  . metFORMIN (GLUCOPHAGE-XR) 500 MG 24 hr tablet TAKE 2 TABLETS EVERY DAY WITH BREAKFAST 180 tablet 1  . PAZEO 0.7 % SOLN Place 1 drop into both eyes daily.  11  . rivaroxaban (XARELTO) 20 MG TABS tablet Take 1 tablet (20 mg total) by mouth daily with supper. 90 tablet 3  . simvastatin (ZOCOR) 20 MG tablet Take 1  tablet (20 mg total) by mouth daily. 90 tablet 1  . TRUE METRIX BLOOD GLUCOSE TEST test strip USE TO TEST BLOOD SUGAR ONCE DAILY. 100 each 3  . TRUEPLUS LANCETS 28G MISC USE TO TEST BLOOD SUGAR ONCE DAILY.  100 each 3  . valsartan-hydrochlorothiazide (DIOVAN-HCT) 160-25 MG tablet Take 1 tablet by mouth daily. 90 tablet 1   No current facility-administered medications on file prior to visit.    Allergies  Allergen Reactions  . Codeine Other (See Comments)    vertigo   Past Surgical History:  Procedure Laterality Date  . ABDOMINAL HYSTERECTOMY    . CESAREAN SECTION    . EYE SURGERY     Family History  Problem Relation Age of Onset  . Diabetes Sister   . Hyperlipidemia Sister   . Hypertension Sister   . Diabetes Brother   . Hyperlipidemia Brother   . Hypertension Brother    Social History   Social History  . Marital status: Widowed    Spouse name: N/A  . Number of children: N/A  . Years of education: N/A   Social History Main Topics  . Smoking status: Never Smoker  . Smokeless tobacco: Never Used  . Alcohol use No  . Drug use: No  . Sexual activity: Not Asked   Other Topics Concern  . None   Social History Narrative  . None   Depression screen [facility] 2/9 11/19/2016 09/30/2016 09/30/2016 07/03/2016 04/01/2016  Decreased Interest 0 0 0 0 0  Down, Depressed, Hopeless 0 0 0 0 0  PHQ - 2 Joseph 0 0 0 0 0    Review of Systems  Respiratory: Negative for cough and shortness of breath.   Gastrointestinal: Negative for constipation and diarrhea.  Genitourinary: Positive for flank pain and urgency.  Musculoskeletal: Positive for back pain.      Objective:   Physical Exam  Constitutional: She is oriented to person, place, and time. She appears well-developed and well-nourished. No distress.  HENT:  Head: Normocephalic and atraumatic.  Eyes: Conjunctivae and EOM are normal.  Neck: Neck supple. No tracheal deviation present.  Cardiovascular: Normal rate, regular rhythm, S1  normal and S2 normal.   Murmur heard.  Systolic (injection, L upper sternal border) murmur is present with a grade of 2/6  Pulmonary/Chest: Effort normal and breath sounds normal. No respiratory distress.  Abdominal: Soft. Bowel sounds are normal. She exhibits distension (mild). There is tenderness (mild) in the right upper quadrant. There is no rebound, no guarding and no CVA tenderness.  Musculoskeletal: Normal range of motion.  Focal tenderness to palpation over the lower thoracic T11-T12, L1 and entire lumbar spine. No SI joint tenderness. No tenderness  over the R paraspinal muscles. No tenderness over bilateral paraspinal muscles. Tenderness to palpation over R lower lateral ribs. LE strength is 5/5 throughout.  Neurological: She is alert and oriented to person, place, and time.  Reflex Scores:      Patellar reflexes are 2+ on the right side and 2+ on the left side.      Achilles reflexes are 2+ on the right side and 2+ on the left side. Skin: Skin is warm and dry.  Psychiatric: She has a normal mood and affect. Her behavior is normal.  Nursing note and vitals reviewed.  BP (!) 156/87 (BP Location: Right Arm, Patient Position: Sitting, Cuff Size: Normal)   Pulse 65   Resp 16   Ht '5\' 3"'$  (1.6 m)   Wt 188 lb 12.8 oz (85.6 kg)   SpO2 100%   BMI 33.44 kg/m     Dg Ribs Unilateral W/chest Right  Result Date: 11/19/2016 CLINICAL DATA:  Pain after prior fall with tenderness to palpation over right lower ribs. EXAM: RIGHT RIBS AND CHEST - 3+ VIEW COMPARISON:  Prior chest x-ray on 04/09/2014 FINDINGS: Stable cardiac enlargement and tortuosity of the thoracic aorta. Lungs show bibasilar scarring. There is no evidence of pulmonary edema, consolidation, pneumothorax, nodule or pleural fluid. No acute rib fracture is identified. No bony lesions or destruction. IMPRESSION: No evidence of right-sided rib fracture or other acute findings. Stable cardiac enlargement. Electronically Signed   By: Aletta Edouard M.D.   On: 11/19/2016 12:34   Dg Abd 1 View  Result Date: 11/19/2016 CLINICAL DATA:  Right flank pain with hematuria and urinary incontinence. EXAM: ABDOMEN - 1 VIEW COMPARISON:  None. FINDINGS: No definite urinary tract calculi are identified by x-ray. There is a moderate amount of fecal material scattered throughout the colon without evidence of fecal impaction. No bowel obstruction identified. The visualized bony structures are unremarkable. IMPRESSION: No calcified urinary tract calculi identified. Moderate fecal material in the colon without evidence of bowel obstruction or fecal impaction. Electronically Signed   By: Aletta Edouard M.D.   On: 11/19/2016 12:39   Results for orders placed or performed in visit on 11/19/16  Urine culture  Result Value Ref Range   Urine Culture, Routine Final report    Urine Culture result 1 Comment   Comprehensive metabolic panel  Result Value Ref Range   Glucose 109 (H) 65 - 99 mg/dL   BUN 15 8 - 27 mg/dL   Creatinine, Ser 0.99 0.57 - 1.00 mg/dL   GFR calc non Af Amer 55 (L) >59 mL/min/1.73   GFR calc Af Amer 64 >59 mL/min/1.73   BUN/Creatinine Ratio 15 12 - 28   Sodium 140 134 - 144 mmol/L   Potassium 3.6 3.5 - 5.2 mmol/L   Chloride 99 96 - 106 mmol/L   CO2 25 18 - 29 mmol/L   Calcium 9.6 8.7 - 10.3 mg/dL   Total Protein 8.4 6.0 - 8.5 g/dL   Albumin 4.6 3.5 - 4.8 g/dL   Globulin, Total 3.8 1.5 - 4.5 g/dL   Albumin/Globulin Ratio 1.2 1.2 - 2.2   Bilirubin Total 0.4 0.0 - 1.2 mg/dL   Alkaline Phosphatase 100 39 - 117 IU/L   AST 22 0 - 40 IU/L   ALT 13 0 - 32 IU/L  CK  Result Value Ref Range   Total CK 130 24 - 173 U/L  POCT urinalysis dipstick  Result Value Ref Range   Color, UA yellow yellow  Clarity, UA clear clear   Glucose, UA negative negative mg/dL   Bilirubin, UA negative negative   Ketones, POC UA negative negative mg/dL   Spec Grav, UA 1.010 1.010 - 1.025   Blood, UA moderate (A) negative   pH, UA 6.0 5.0 - 8.0    Protein Ur, POC negative negative mg/dL   Urobilinogen, UA 0.2 0.2 or 1.0 E.U./dL   Nitrite, UA Negative Negative   Leukocytes, UA Negative Negative  POCT Microscopic Urinalysis ([facility])  Result Value Ref Range   WBC,UR,HPF,POC None None WBC/hpf   RBC,UR,HPF,POC Few (A) None RBC/hpf   Bacteria None None, Too numerous to count   Mucus Absent Absent   Epithelial Cells, UR Per Microscopy Few (A) None, Too numerous to count cells/hpf  POCT CBC  Result Value Ref Range   WBC 4.3 (A) 4.6 - 10.2 K/uL   Lymph, poc 2.3 0.6 - 3.4   POC LYMPH PERCENT 54.3 (A) 10 - 50 %L   MID (cbc) 0.2 0 - 0.9   POC MID % 5.2 0 - 12 %M   POC Granulocyte 1.7 (A) 2 - 6.9   Granulocyte percent 40.5 37 - 80 %G   RBC 4.99 4.04 - 5.48 M/uL   Hemoglobin 13.8 12.2 - 16.2 g/dL   HCT, POC 41.0 37.7 - 47.9 %   MCV 82.1 80 - 97 fL   MCH, POC 27.6 27 - 31.2 pg   MCHC 33.6 31.8 - 35.4 g/dL   RDW, POC 15.3 %   Platelet Count, POC 180 142 - 424 K/uL   MPV 8.4 0 - 99.8 fL  POCT SEDIMENTATION RATE  Result Value Ref Range   POCT SED RATE 37 (A) 0 - 22 mm/hr    Assessment & Plan:    1. Acute right flank pain - suspect contusion after fall, try heat. I also suspect getting worse due to contipaiton So start twice a day MiraLAX 5 days then daily   2. Acute midline low back pain without sciatica - If does not tolerate Tizanidine will switch to Skelaxin.   3. Urinary incontinence, unspecified type   4. Hand cramp   5. Essential hypertension - increase valsartan from 160 to 320, cont hctz 25 in combo pill - home bp monitor does appear inaccurate when checked against ours if office today so pt given note so she can try to get [facility] (it is their namebrand - to recalibrate or replace.  6.       Asymptomatic microscopic hematuria. No gross hematuria. Patient did see urology in 2015. Discussed with patient today and their notes reviewed. Patient did have full workup at that time with no etiology found so advised to continue  checking UA annually and repeat referral if worsening, symptoms, or gross hematuria occurs which patient denies  Orders Placed This Encounter  Procedures  . Urine culture  . DG Abd 1 View    Standing Status:   Future    Number of Occurrences:   1    Standing Expiration Date:   11/19/2017    Order Specific Question:   Reason for Exam (SYMPTOM  OR DIAGNOSIS REQUIRED)    Answer:   right flank pain with hematuria and incontinence    Order Specific Question:   Preferred imaging location?    Answer:   External  . DG Ribs Unilateral W/Chest Right    Standing Status:   Future    Number of Occurrences:   1    Standing Expiration  Date:   11/19/2017    Order Specific Question:   Reason for Exam (SYMPTOM  OR DIAGNOSIS REQUIRED)    Answer:   continued ttp over lateral lower right ribs since fall 2 mos prior    Order Specific Question:   Preferred imaging location?    Answer:   External  . Comprehensive metabolic panel  . CK  . POCT urinalysis dipstick  . POCT Microscopic Urinalysis ([facility])  . POCT CBC  . POCT SEDIMENTATION RATE    Meds ordered this encounter  Medications  . valsartan-hydrochlorothiazide (DIOVAN-HCT) 320-25 MG tablet    Sig: Take 1 tablet by mouth daily.    Dispense:  90 tablet    Refill:  1  . tiZANidine (ZANAFLEX) 2 MG tablet    Sig: Take 1-2 tablets (2-4 mg total) by mouth at bedtime.    Dispense:  60 tablet    Refill:  0    Please d/c meloxicam rx that was sent over on 5/19  . acetaminophen (TYLENOL) 500 MG tablet    Sig: Take 1 tablet (500 mg total) by mouth every 6 (six) hours as needed.    Dispense:  30 tablet    Refill:  0  . polyethylene glycol powder (GLYCOLAX/MIRALAX) powder    Sig: Take 17 g by mouth 2 (two) times daily as needed.    Dispense:  500 g    Refill:  1   Over 40 min spent in face-to-face evaluation of and consultation with patient and coordination of care.  Over 50% of this time was spent counseling this patient.  I personally performed the  services described in this documentation, which was scribed in my presence. The recorded information has been reviewed and considered, and addended by me as needed.   Delman Cheadle, M.D.  Primary Care at [facility] [address] [place], [place] [address] [phone] phone [phone] fax  11/21/16 11:25 PM

## 2016-11-20 LAB — COMPREHENSIVE METABOLIC PANEL
ALT: 13 IU/L (ref 0–32)
AST: 22 IU/L (ref 0–40)
Albumin/Globulin Ratio: 1.2 (ref 1.2–2.2)
Albumin: 4.6 g/dL (ref 3.5–4.8)
Alkaline Phosphatase: 100 IU/L (ref 39–117)
BILIRUBIN TOTAL: 0.4 mg/dL (ref 0.0–1.2)
BUN/Creatinine Ratio: 15 (ref 12–28)
BUN: 15 mg/dL (ref 8–27)
CHLORIDE: 99 mmol/L (ref 96–106)
CO2: 25 mmol/L (ref 18–29)
Calcium: 9.6 mg/dL (ref 8.7–10.3)
Creatinine, Ser: 0.99 mg/dL (ref 0.57–1.00)
GFR calc Af Amer: 64 mL/min/{1.73_m2} (ref >59)
GFR calc non Af Amer: 55 mL/min/{1.73_m2} — ABNORMAL LOW (ref >59)
GLUCOSE: 109 mg/dL — AB (ref 65–99)
Globulin, Total: 3.8 g/dL (ref 1.5–4.5)
Potassium: 3.6 mmol/L (ref 3.5–5.2)
Sodium: 140 mmol/L (ref 134–144)
Total Protein: 8.4 g/dL (ref 6.0–8.5)

## 2016-11-20 LAB — CK: CK TOTAL: 130 U/L (ref 24–173)

## 2016-11-21 LAB — URINE CULTURE

## 2016-12-08 ENCOUNTER — Other Ambulatory Visit: Payer: Self-pay | Admitting: Family Medicine

## 2016-12-08 DIAGNOSIS — E785 Hyperlipidemia, unspecified: Secondary | ICD-10-CM

## 2016-12-11 ENCOUNTER — Other Ambulatory Visit: Payer: Self-pay | Admitting: Family Medicine

## 2016-12-22 ENCOUNTER — Other Ambulatory Visit: Payer: Self-pay | Admitting: Family Medicine

## 2016-12-22 DIAGNOSIS — E1165 Type 2 diabetes mellitus with hyperglycemia: Principal | ICD-10-CM

## 2016-12-22 DIAGNOSIS — IMO0001 Reserved for inherently not codable concepts without codable children: Secondary | ICD-10-CM

## 2017-01-04 ENCOUNTER — Telehealth: Payer: Self-pay | Admitting: Family Medicine

## 2017-01-04 NOTE — Telephone Encounter (Signed)
Please advise 

## 2017-01-04 NOTE — Telephone Encounter (Signed)
PATIENT WOULD LIKE DR. SHAW TO KNOW THAT SHE SAW A REPORT ON [employer] THAT VALSARTAN-HCTZ 160-25 MG CAUSES CANCER. SHE WOULD LIKE DR. SHAW TO PRESCRIBE HER TO HAVE ANOTHER KIND OF BLOOD PRESSURE MEDICINE. BEST PHONE [phone] (CELL) PHARMACY CHOICE IS [facility] [place]

## 2017-01-05 ENCOUNTER — Other Ambulatory Visit: Payer: Self-pay | Admitting: Family Medicine

## 2017-01-05 DIAGNOSIS — E785 Hyperlipidemia, unspecified: Secondary | ICD-10-CM

## 2017-01-05 NOTE — Telephone Encounter (Signed)
I have not heard any such report and all BP meds have side effects so there is no simple switch - we can keep it in the same class but the sister meds are going to be less effective. Also, that pill is 2 bp meds in one so I would need to now which component she is concerned a bout.  Will be glad to address at her next OV where we can explore the risks/benefits of this vs others, what she has tried before, and what the other poss alternatives are so she can make the best fully informed decision rather than a knee jerk reaction and poss end up on something worse.

## 2017-01-06 NOTE — Telephone Encounter (Signed)
I spoke with pt - gave message per Dr. Brigitte Pulse.  Pt states she has appt next month and will wait and discuss with Dr. Brigitte Pulse then.

## 2017-01-07 MED ORDER — IRBESARTAN-HYDROCHLOROTHIAZIDE 300-12.5 MG PO TABS: 1.0000 | ORAL_TABLET | Freq: Every day | ORAL | 0 refills | Status: DC

## 2017-01-07 NOTE — Telephone Encounter (Signed)
Spoke to patient concerning the valsartan re-call and she will check with the pharmacy to see if the Rx . Also, she said she has an appointment next month.

## 2017-01-07 NOTE — Telephone Encounter (Signed)
So sorry - just saw that report as well.  It is only certainly types of the valsartan - I recommend she check with her pharmacist to see if this is one of the generic brands being re-called.  If so, than we should definitely switch her to a sister medication. If not, than I will leave it up to her whether we change or not. It appears that there was an impurity that got into the medication in one of the ways that the valsartan was made and that impurity has been linked to liver disease when given in huge doses to rats.   It looks like at our last visit 2 mos ago, we increased the valsartan from 160 to 320. . .  But then refilled the 160mg  tab 3 weeks later so she actually has a 6 mo supply of both.  In case she does need/decide to switch, I sent irbesartan-hctz in to her pharmacy. Needs to come into office within 1 mo of changing meds for BP and lab check.

## 2017-01-07 NOTE — Telephone Encounter (Signed)
Spoke to patient about valsartan re-call and she understood. I told her she can check with her pharmacist if this is one of the generic brands re-called. Also, she has an appointment in August.

## 2017-01-20 ENCOUNTER — Telehealth: Payer: Self-pay | Admitting: Family Medicine

## 2017-01-20 NOTE — Telephone Encounter (Signed)
Please advise 

## 2017-01-20 NOTE — Telephone Encounter (Signed)
PATIENT SAW DR. SHAW IN June FOR (R) SIDE PAIN. SHE SAID THE ACETAMINOPHEN (TYLENOL) 500 MG IS NOT HELPING AT ALL AND THE PAIN HAS NOT GOTTEN BETTER. SHOULD SHE GET ANOTHER PAIN MEDICINE OR SHOULD SHE GO TO HAVE SPECIAL TESTING DONE? BEST PHONE [phone] (CELL) PHARMACY CHOICE IS [facility] ON [address]. [place]

## 2017-01-20 NOTE — Telephone Encounter (Signed)
Recommend recheck in the office - pt had some blood and bacteria in urine and due to area of pain, we probably need to recheck on her kidneys and ensure the pain is not coming from there and then decide if we need more back imaging vs physical therapy or orthopedics.  Did the zanaflex help at all?  I can call in some tylenol #3 or tramadol if needed in the interim until she can get back in to see me.

## 2017-01-21 NOTE — Telephone Encounter (Signed)
Left VM to have pt call back

## 2017-02-01 ENCOUNTER — Other Ambulatory Visit: Payer: Self-pay | Admitting: Family Medicine

## 2017-02-02 ENCOUNTER — Telehealth: Payer: Self-pay

## 2017-02-02 NOTE — Telephone Encounter (Signed)
Call pt to let her know awv appt is canceled tomorrow. She had awv completed during an office visit in April 2018 and is not due for one yet. -nr   Josepha Pigg, B.A.  Care Guide [phone]

## 2017-02-03 ENCOUNTER — Ambulatory Visit: Payer: Medicare HMO

## 2017-02-05 NOTE — Telephone Encounter (Signed)
Pt has apmt for 8/27 received 30 day supply of BP req./// pt. Only has 7 doses left and will run out  Before apmt. Please fax 90 day rx to [facility]  [phone] please call

## 2017-02-08 NOTE — Telephone Encounter (Signed)
Refill request for metformin 500 mg -24 hr tablet denied. Refill not appropriate given 90 day supply on 09/30/16 with one refill

## 2017-02-14 ENCOUNTER — Encounter: Payer: Self-pay | Admitting: Family Medicine

## 2017-02-14 ENCOUNTER — Ambulatory Visit (INDEPENDENT_AMBULATORY_CARE_PROVIDER_SITE_OTHER): Payer: Medicare HMO | Admitting: Family Medicine

## 2017-02-14 VITALS — BP 165/76 | HR 67 | Temp 98.0°F | Resp 16 | Ht 63.0 in | Wt 187.2 lb

## 2017-02-14 DIAGNOSIS — Z87898 Personal history of other specified conditions: Secondary | ICD-10-CM | POA: Diagnosis not present

## 2017-02-14 DIAGNOSIS — I480 Paroxysmal atrial fibrillation: Secondary | ICD-10-CM | POA: Diagnosis not present

## 2017-02-14 DIAGNOSIS — R3915 Urgency of urination: Secondary | ICD-10-CM

## 2017-02-14 DIAGNOSIS — Z5181 Encounter for therapeutic drug level monitoring: Secondary | ICD-10-CM | POA: Diagnosis not present

## 2017-02-14 DIAGNOSIS — E119 Type 2 diabetes mellitus without complications: Secondary | ICD-10-CM

## 2017-02-14 DIAGNOSIS — I10 Essential (primary) hypertension: Secondary | ICD-10-CM

## 2017-02-14 DIAGNOSIS — R109 Unspecified abdominal pain: Secondary | ICD-10-CM | POA: Diagnosis not present

## 2017-02-14 DIAGNOSIS — R1031 Right lower quadrant pain: Secondary | ICD-10-CM

## 2017-02-14 DIAGNOSIS — R350 Frequency of micturition: Secondary | ICD-10-CM

## 2017-02-14 DIAGNOSIS — R10A1 Flank pain, right side: Secondary | ICD-10-CM

## 2017-02-14 DIAGNOSIS — E782 Mixed hyperlipidemia: Secondary | ICD-10-CM | POA: Diagnosis not present

## 2017-02-14 LAB — POCT CBC
Granulocyte percent: 44 %G (ref 37–80)
HCT, POC: 42.5 % (ref 37.7–47.9)
HEMOGLOBIN: 13.9 g/dL (ref 12.2–16.2)
Lymph, poc: 2.2 (ref 0.6–3.4)
MCH: 27.3 pg (ref 27–31.2)
MCHC: 32.7 g/dL (ref 31.8–35.4)
MCV: 83.6 fL (ref 80–97)
MID (CBC): 0.3 (ref 0–0.9)
MPV: 9.1 fL (ref 0–99.8)
PLATELET COUNT, POC: 176 10*3/uL (ref 142–424)
POC Granulocyte: 2 (ref 2–6.9)
POC LYMPH PERCENT: 48.9 %L (ref 10–50)
POC MID %: 7.1 %M (ref 0–12)
RBC: 5.09 M/uL (ref 4.04–5.48)
RDW, POC: 14.5 %
WBC: 4.5 10*3/uL — AB (ref 4.6–10.2)

## 2017-02-14 LAB — POC HEMOCCULT BLD/STL (OFFICE/1-CARD/DIAGNOSTIC): FECAL OCCULT BLD: NEGATIVE

## 2017-02-14 LAB — POCT URINALYSIS DIP (MANUAL ENTRY)
Glucose, UA: NEGATIVE mg/dL
Leukocytes, UA: NEGATIVE
NITRITE UA: NEGATIVE
PH UA: 6 (ref 5.0–8.0)
Spec Grav, UA: 1.02 (ref 1.010–1.025)
Urobilinogen, UA: 0.2 E.U./dL

## 2017-02-14 LAB — POCT GLYCOSYLATED HEMOGLOBIN (HGB A1C): HEMOGLOBIN A1C: 6.4

## 2017-02-14 MED ORDER — METFORMIN HCL ER 500 MG PO TB24: ORAL_TABLET | ORAL | 1 refills | Status: DC

## 2017-02-14 MED ORDER — IRBESARTAN-HYDROCHLOROTHIAZIDE 300-12.5 MG PO TABS: 1.0000 | ORAL_TABLET | Freq: Every day | ORAL | 0 refills | Status: DC

## 2017-02-14 NOTE — Progress Notes (Signed)
Subjective:    Patient ID: Catherine Joseph, female    DOB: [birth year], 76 y.o.   MRN: [id number] Chief Complaint  Patient presents with  . Diabetes  . right lower abdomen    per pt "I have pain all the time, I need testing"    HPI  [name] is a delightful 76 yo woman here for a 4 mo follow-up on her chronic medical conditions as detailed below.  DMII: Has been well controlled on metformin XR 1000 qd and glipizide 10 bid but ran out of metfomin several weeks ago (it was at [employer] and she was wanting it to go through the [employer] order) w/ fasting cbgs int he 90s for the past several days even w/o the metofrmin.  She did have 1 hypoglycemic episode with cbg 70. See Dr. Idolina Primer for reg eye exam. Nml microalb 09/30/2016.  Does not take asa since on xaretlo. Foot exam 06/2016 Refered to podiatry for DM shoes but they were unable to make contact w/ pt.  Lab Results  Component Value Date   HGBA1C 6.8 09/30/2016   HGBA1C 6.6 07/03/2016   HGBA1C 6.5 04/01/2016    Lab Results  Component Value Date   GFRAA 64 11/19/2016   GFRAA 72 09/30/2016   GFRAA 66 07/03/2016    GI: decreased appetite and increase abdominal distention and flatulence. Was referred to Dr. Collene Mares for colonoscopy 06/2015. Advised to avoid otc nsaids Now statating nml appetite. Constipation sometiems and has to take miralax but went 2 times a day but no diarrhea. + flatus, No n/v. Doesn't notice any abd distention.  C/o pain in the right flank. Back pain gone. Does not feel like prior abscess.  Low Back Pain:  Pt missed a few steps while walking down stairs and fell on buttocks on 4/1 after which she developed intermittent right flank pain and central upper lumbar pain that is exacerbated at night with lying and improved with standing. Was seen in the ER on 4/7. Started trial of Meloxicam 7.5 bid prn w/o significant relief. She reports associated symptoms of urinary urgency with leaking only while lying down since her  fall and hand cramps x 1 month. No h/o kidney stones, pain radiating into lower extremities, constipation, diarrhea, bowel incontinence, cough, sob. Patient was then evaluated in the office by myself 3 months prior for continued pain despite being 2 months out from her injury. Right rib x-rays were normal and a KUB showed only moderate fecal material in the colon. CMP, CK, and UA were nml. CBC with wbc slightly suppressed and ESR slightly elevated.  I suspected that patient had a very deep contusion after her fall due to being anticoagulated with Xarelto and possibly exacerbated by constipation so advised topical heat, APAP 500, and twice a day MiraLAX x 5d, then qd.  Also started when necessary tizanidine. Unfortunately symptoms have not improved at all.  History of right lower quadrant abscess that required percutaneous drainage Oct 2015. Status post hysterectomy.  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  Was initially thought to be from diverticulitis with local perforation and abscess formation.  Interestingly patient presented with the right lower quadrant pain several weeks before she was diagnosed and initially CT of the abdomen and pelvis 2 weeks prior was normal and abdominal ultrasound 2 days prior was normal.  Her current sxs do NOT feel similar to when she had the RLQ abscess.   A fib: On xarelto.  Sees Dr. Johnsie Cancel as on  flecainide - last saw him 07/22/2016 and f/u every 6 mos but next appt is not yet scheduled.  HTN: Was on valsartan-hctz which we increased to 320-25 from 160-25 qd at her last visit but due to recall was changed to irbesartan-hctz 300-12.5.  Did NOT get a home BP monitor so has not been able to check BP since we changed the pillc. Ran out of the avapro 3d ago.  HLD: Last lipid panel 11/29/15 w/ LDL 89 with non-hdl of 108 on simvastatin 20.   Osteopenia: needs dexa. Last done March 2015 showed osteopenia -1.7 in L spine.  Morning vertigo - self limited  Benign Microscopic  hematuria: Has had full workup with [facility] in 2015.  Continue checking UA annually and repeat referral if worsening, symptoms, or gross hematuria occurs.  She is originally from [place].  Depression screen [facility] 2/9 11/19/2016 09/30/2016 09/30/2016 07/03/2016 04/01/2016  Decreased Interest 0 0 0 0 0  Down, Depressed, Hopeless 0 0 0 0 0  PHQ - 2 Score 0 0 0 0 0   Past Medical History:  Diagnosis Date  . Anemia   . Bradycardia   . Cataract   . Chronic kidney disease   . Diabetes mellitus type II   . Hypertension   . Osteoporosis   . Syncope   . Weakness    Past Surgical History:  Procedure Laterality Date  . ABDOMINAL HYSTERECTOMY    . CESAREAN SECTION    . EYE SURGERY     Current Outpatient Prescriptions on File Prior to Visit  Medication Sig Dispense Refill  . acetaminophen (TYLENOL) 500 MG tablet Take 1 tablet (500 mg total) by mouth every 6 (six) hours as needed. 30 tablet 0  . Alcohol Swabs (B-D SINGLE USE SWABS REGULAR) PADS USE TO CHECK BLOOD SUGAR DAILY  100 each 3  . Blood Glucose Calibration (SURECHEK CONTROL SOLUTION) Normal LIQD Use to check blood sugar daily. Dx E11.65 1 each 3  . blood glucose meter kit and supplies Use to test blood sugar once daily. Dx: E11.65 1 each 0  . Blood Glucose Monitoring Suppl (TRUE METRIX AIR GLUCOSE METER) DEVI 1 each by Does not apply route daily. Use to test blood sugar once daily. Dx: E11.65 1 Device 0  . clotrimazole (GYNE-LOTRIMIN 3) 2 % vaginal cream Place 1 Applicatorful vaginally at bedtime. 21 g 2  . flecainide (TAMBOCOR) 100 MG tablet Take 0.5 tablets (50 mg total) by mouth 2 (two) times daily. 180 tablet 3  . glipiZIDE (GLUCOTROL) 10 MG tablet TAKE 1 TABLET TWICE DAILY BEFORE A MEAL 180 tablet 0  . irbesartan-hydrochlorothiazide (AVALIDE) 300-12.5 MG tablet Take 1 tablet by mouth daily. 30 tablet 0  . metFORMIN (GLUCOPHAGE-XR) 500 MG 24 hr tablet TAKE 2 TABLETS EVERY DAY WITH BREAKFAST 180 tablet 1  . PAZEO 0.7 % SOLN Place  1 drop into both eyes daily.  11  . polyethylene glycol powder (GLYCOLAX/MIRALAX) powder Take 17 g by mouth 2 (two) times daily as needed. 500 g 1  . rivaroxaban (XARELTO) 20 MG TABS tablet Take 1 tablet (20 mg total) by mouth daily with supper. 90 tablet 3  . simvastatin (ZOCOR) 20 MG tablet Take 1 tablet (20 mg total) by mouth daily at 6 PM. 90 tablet 0  . tiZANidine (ZANAFLEX) 2 MG tablet Take 1-2 tablets (2-4 mg total) by mouth at bedtime. 60 tablet 0  . TRUE METRIX BLOOD GLUCOSE TEST test strip USE TO TEST BLOOD SUGAR ONCE DAILY. 100  each 3  . TRUEPLUS LANCETS 28G MISC USE TO TEST BLOOD SUGAR ONCE DAILY.  100 each 3   No current facility-administered medications on file prior to visit.    Allergies  Allergen Reactions  . Codeine Other (See Comments)    vertigo   Family History  Problem Relation Age of Onset  . Diabetes Sister   . Hyperlipidemia Sister   . Hypertension Sister   . Diabetes Brother   . Hyperlipidemia Brother   . Hypertension Brother    Social History   Social History  . Marital status: Widowed    Spouse name: N/A  . Number of children: N/A  . Years of education: N/A   Social History Main Topics  . Smoking status: Never Smoker  . Smokeless tobacco: Never Used  . Alcohol use No  . Drug use: No  . Sexual activity: Not on file   Other Topics Concern  . Not on file   Social History Narrative  . No narrative on file   Depression screen [facility] 2/9 02/14/2017 11/19/2016 09/30/2016 09/30/2016 07/03/2016  Decreased Interest 0 0 0 0 0  Down, Depressed, Hopeless 0 0 0 0 0  PHQ - 2 Score 0 0 0 0 0     Review of Systems  Constitutional: Positive for activity change and appetite change (Decreased). Negative for chills and fever.  Gastrointestinal: Positive for abdominal distention. Negative for abdominal pain, constipation, diarrhea, nausea and vomiting.  Genitourinary: Positive for enuresis, frequency and urgency. Negative for decreased urine volume, difficulty  urinating and flank pain.  Musculoskeletal: Positive for arthralgias, back pain and myalgias. Negative for gait problem and joint swelling.  Skin: Negative for color change and rash.  Neurological: Negative for weakness and numbness.  Hematological: Negative for adenopathy. Does not bruise/bleed easily.  Psychiatric/Behavioral: Negative for dysphoric mood. The patient is not nervous/anxious.       Objective:   Physical Exam  Constitutional: She is oriented to person, place, and time. She appears well-developed and well-nourished. No distress.  HENT:  Head: Normocephalic and atraumatic.  Right Ear: External ear normal.  Left Ear: External ear normal.  Eyes: Conjunctivae are normal. No scleral icterus.  Neck: Normal range of motion. Neck supple. No thyromegaly present.  Cardiovascular: Normal rate, regular rhythm and intact distal pulses.   Murmur (RUSB) heard.  Systolic murmur is present with a grade of 2/6  Pulmonary/Chest: Effort normal and breath sounds normal. No respiratory distress.  Abdominal: Normal appearance. She exhibits distension (mild). Bowel sounds are increased. There is tenderness in the right lower quadrant. There is CVA tenderness (right). There is no rigidity, no rebound and no guarding.  Musculoskeletal: She exhibits no edema.       Lumbar back: She exhibits decreased range of motion, bony tenderness, pain and spasm.  ttp over low lumbar spinous process, not sig tender over paraspinal muscles, Si joints  Lymphadenopathy:    She has no cervical adenopathy.  Neurological: She is alert and oriented to person, place, and time.  Skin: Skin is warm and dry. She is not diaphoretic. No erythema.  Psychiatric: She has a normal mood and affect. Her behavior is normal.      BP (!) 165/83   Pulse 67   Temp 98 F (36.7 C) (Oral)   Resp 16   Ht '5\' 3"'$  (1.6 m)   Wt 187 lb 3.2 oz (84.9 kg)   SpO2 97%   BMI 33.16 kg/m    Assessment & Plan:  1. Type 2 diabetes mellitus  without complication, without long-term current use of insulin ([place]) - been off metformin for several wks w/ no improvement in GI sxs so will restart. Cont glipizide though may need to decrease dose as a1c at goal - cut in half if any hypoglycemic episodes  2. Essential hypertension - has been out of the irbesartan-hctz for several days - resume, repeat bmp at f/u  3. Mixed hyperlipidemia   4. Paroxysmal atrial fibrillation (HCC)   5. Medication monitoring encounter   6. 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 . .  Ddx also includes renal abnml or partial sbo from adhesions from prior infexn.  Needs CT to eval ASAP. HOC neg in office x 1 - check home cards as well to confirm.  7. Urinary frequency   8. Urinary urgency   9. Right flank pain - UA grossly abnml though no concern for infxn - needs eval of renal to exclude cyst, tumor, or other physical kidney abnml causing pain  10. History of abdominal abscess     Orders Placed This Encounter  Procedures  . CT Abdomen Pelvis W Contrast    Standing Status:   Future    Standing Expiration Date:   05/17/2018    Order Specific Question:   If indicated for the ordered procedure, I authorize the administration of contrast media per Radiology protocol    Answer:   Yes    Order Specific Question:   Preferred imaging location?    Answer:   [facility]    Order Specific Question:   Radiology Contrast Protocol - do NOT remove file path    Answer:   [email or web address]  . Basic metabolic panel    Order Specific Question:   Has the patient fasted?    Answer:   No  . Care  order/instruction:    Scheduling Instructions:     Recheck BP  . POCT glycosylated hemoglobin (Hb A1C)  . POCT CBC  . POCT urinalysis dipstick  . POC Hemoccult Bld/Stl (3-Cd Home Screen)    Standing Status:   Future    Standing Expiration Date:   02/14/2018  . POC Hemoccult Bld/Stl (1-Cd Office Dx)    Meds ordered this encounter  Medications  . metFORMIN (GLUCOPHAGE-XR) 500 MG 24 hr tablet    Sig: TAKE 2 TABLETS EVERY DAY WITH BREAKFAST    Dispense:  180 tablet    Refill:  1  . DISCONTD: irbesartan-hydrochlorothiazide (AVALIDE) 300-12.5 MG tablet    Sig: Take 1 tablet by mouth daily.    Dispense:  90 tablet    Refill:  0    D/c all piror rx for valsartan and valsartan-hctz that were on file.  . irbesartan-hydrochlorothiazide (AVALIDE) 300-12.5 MG tablet    Sig: Take 1 tablet by mouth daily.    Dispense:  30 tablet    Refill:  0    D/c all piror rx for valsartan and valsartan-hctz that were on file.   Billed for AWV and CPE 09/2016.  Will complete [employer] forms per pt request for prevention/screening updates and foot exam info.  Delman Cheadle, M.D.  Primary Care at [facility] [place], [place] [address] [phone] phone [phone] fax  02/14/17 11:46 AM  Results for orders placed or performed in visit on 96/88/64  Basic metabolic panel  Result Value Ref Range   Glucose 104 (H) 65 - 99 mg/dL   BUN 18 8 - 27 mg/dL   Creatinine, Ser 1.16 (H) 0.57 - 1.00 mg/dL   GFR calc non Af Amer 45 (L) >59 mL/min/1.73   GFR calc Af Amer 52 (L) >59 mL/min/1.73   BUN/Creatinine Ratio 16 12 - 28   Sodium 144 134 - 144 mmol/L   Potassium 4.4 3.5 - 5.2 mmol/L   Chloride 103 96 - 106 mmol/L   CO2 25 20 - 29 mmol/L   Calcium 9.6 8.7 - 10.3 mg/dL  POCT glycosylated hemoglobin (Hb A1C)  Result Value Ref Range   Hemoglobin A1C 6.4   POCT CBC  Result Value Ref Range   WBC 4.5 (A) 4.6 - 10.2 K/uL   Lymph, poc 2.2 0.6 - 3.4   POC LYMPH PERCENT 48.9 10 - 50 %L    MID (cbc) 0.3 0 - 0.9   POC MID % 7.1 0 - 12 %M   POC Granulocyte 2.0 2 - 6.9   Granulocyte percent 44.0 37 - 80 %G   RBC 5.09 4.04 - 5.48 M/uL   Hemoglobin 13.9 12.2 - 16.2 g/dL   HCT, POC 42.5 37.7 - 47.9 %   MCV 83.6 80 - 97 fL   MCH, POC 27.3 27 - 31.2 pg   MCHC 32.7 31.8 - 35.4 g/dL   RDW, POC 14.5 %   Platelet Count, POC 176 142 - 424 K/uL   MPV 9.1 0 - 99.8 fL  POCT urinalysis dipstick  Result Value Ref Range   Color, UA yellow yellow   Clarity, UA clear clear   Glucose, UA negative negative mg/dL   Bilirubin, UA small (A) negative   Ketones, POC UA small (15) (A) negative mg/dL   Spec Grav, UA 1.020 1.010 - 1.025   Blood, UA large (A) negative   pH, UA 6.0 5.0 - 8.0   Protein Ur, POC =30 (A) negative mg/dL   Urobilinogen, UA 0.2 0.2 or 1.0 E.U./dL   Nitrite, UA Negative Negative   Leukocytes, UA Negative Negative  POC Hemoccult Bld/Stl (1-Cd Office Dx)  Result Value Ref Range   Card #1 Date 02/14/17    Fecal Occult Blood, POC Negative Negative

## 2017-02-14 NOTE — Patient Instructions (Signed)
     IF you received an x-ray today, you will receive an invoice from [facility]. Please contact [facility] at [phone] with questions or concerns regarding your invoice.   IF you received labwork today, you will receive an invoice from [facility]. Please contact [facility] at [phone] with questions or concerns regarding your invoice.   Our billing staff will not be able to assist you with questions regarding bills from these companies.  You will be contacted with the lab results as soon as they are available. The fastest way to get your results is to activate your My Chart account. Instructions are located on the last page of this paperwork. If you have not heard from us regarding the results in 2 weeks, please contact this office.     

## 2017-02-15 LAB — BASIC METABOLIC PANEL
BUN/Creatinine Ratio: 16 (ref 12–28)
BUN: 18 mg/dL (ref 8–27)
CO2: 25 mmol/L (ref 20–29)
Calcium: 9.6 mg/dL (ref 8.7–10.3)
Chloride: 103 mmol/L (ref 96–106)
Creatinine, Ser: 1.16 mg/dL — ABNORMAL HIGH (ref 0.57–1.00)
GFR calc Af Amer: 52 mL/min/{1.73_m2} — ABNORMAL LOW (ref >59)
GFR calc non Af Amer: 45 mL/min/{1.73_m2} — ABNORMAL LOW (ref >59)
GLUCOSE: 104 mg/dL — AB (ref 65–99)
Potassium: 4.4 mmol/L (ref 3.5–5.2)
SODIUM: 144 mmol/L (ref 134–144)

## 2017-02-24 ENCOUNTER — Encounter: Payer: Self-pay | Admitting: Family Medicine

## 2017-02-24 ENCOUNTER — Ambulatory Visit (INDEPENDENT_AMBULATORY_CARE_PROVIDER_SITE_OTHER): Payer: Medicare HMO

## 2017-02-24 ENCOUNTER — Ambulatory Visit (INDEPENDENT_AMBULATORY_CARE_PROVIDER_SITE_OTHER): Payer: Medicare HMO | Admitting: Family Medicine

## 2017-02-24 VITALS — BP 174/81 | HR 60 | Temp 98.4°F | Resp 18 | Ht 63.07 in | Wt 189.0 lb

## 2017-02-24 DIAGNOSIS — R319 Hematuria, unspecified: Secondary | ICD-10-CM | POA: Diagnosis not present

## 2017-02-24 DIAGNOSIS — R1031 Right lower quadrant pain: Secondary | ICD-10-CM

## 2017-02-24 DIAGNOSIS — R140 Abdominal distension (gaseous): Secondary | ICD-10-CM | POA: Diagnosis not present

## 2017-02-24 DIAGNOSIS — Z5181 Encounter for therapeutic drug level monitoring: Secondary | ICD-10-CM

## 2017-02-24 DIAGNOSIS — K5900 Constipation, unspecified: Secondary | ICD-10-CM

## 2017-02-24 DIAGNOSIS — R8290 Unspecified abnormal findings in urine: Secondary | ICD-10-CM

## 2017-02-24 DIAGNOSIS — N3000 Acute cystitis without hematuria: Secondary | ICD-10-CM | POA: Diagnosis not present

## 2017-02-24 LAB — POCT URINALYSIS DIP (MANUAL ENTRY)
BILIRUBIN UA: NEGATIVE
Glucose, UA: NEGATIVE mg/dL
Ketones, POC UA: NEGATIVE mg/dL
Nitrite, UA: NEGATIVE
PH UA: 8 (ref 5.0–8.0)
Protein Ur, POC: NEGATIVE mg/dL
Spec Grav, UA: 1.02 (ref 1.010–1.025)
Urobilinogen, UA: 0.2 E.U./dL

## 2017-02-24 NOTE — Patient Instructions (Addendum)
It is fine to drink the magnesium citrate laxative. You should be getting a call from [facility] imaging to schedule your abdominal and pelvic CT scan. If you do not hear from them by Monday, please call our office and speak to one of the people on the referrals team. Make sure you are drinking plenty of water.   IF you received an x-ray today, you will receive an invoice from [facility]. Please contact [facility] at [phone] with questions or concerns regarding your invoice.   IF you received labwork today, you will receive an invoice from [facility]. Please contact [facility] at [phone] with questions or concerns regarding your invoice.   Our billing staff will not be able to assist you with questions regarding bills from these companies.  You will be contacted with the lab results as soon as they are available. The fastest way to get your results is to activate your My Chart account. Instructions are located on the last page of this paperwork. If you have not heard from [place] regarding the results in 2 weeks, please contact this office.     Constipation, Adult Constipation is when a person has fewer bowel movements in a week than normal, has difficulty having a bowel movement, or has stools that are dry, hard, or larger than normal. Constipation may be caused by an underlying condition. It may become worse with age if a person takes certain medicines and does not take in enough fluids. Follow these instructions at home: Eating and drinking   Eat foods that have a lot of fiber, such as fresh fruits and vegetables, whole grains, and beans.  Limit foods that are high in fat, low in fiber, or overly processed, such as french fries, hamburgers, cookies, candies, and soda.  Drink enough fluid to keep your urine clear or pale yellow. General instructions  Exercise regularly or as told by your health care provider.  Go to the restroom when you have the urge to go. Do not  hold it in.  Take over-the-counter and prescription medicines only as told by your health care provider. These include any fiber supplements.  Practice pelvic floor retraining exercises, such as deep breathing while relaxing the lower abdomen and pelvic floor relaxation during bowel movements.  Watch your condition for any changes.  Keep all follow-up visits as told by your health care provider. This is important. Contact a health care provider if:  You have pain that gets worse.  You have a fever.  You do not have a bowel movement after 4 days.  You vomit.  You are not hungry.  You lose weight.  You are bleeding from the anus.  You have thin, pencil-like stools. Get help right away if:  You have a fever and your symptoms suddenly get worse.  You leak stool or have blood in your stool.  Your abdomen is bloated.  You have severe pain in your abdomen.  You feel dizzy or you faint. This information is not intended to replace advice given to you by your health care provider. Make sure you discuss any questions you have with your health care provider. Document Released: 03/05/2004 Document Revised: 12/26/2015 Document Reviewed: 11/26/2015 Elsevier Interactive Patient Education  2017 [employer].

## 2017-02-24 NOTE — Progress Notes (Signed)
Subjective:    Patient ID: Catherine Joseph, female    DOB: 10-Aug-1940, 76 y.o.   MRN: [id number] Chief Complaint  Patient presents with  . Hypertension    f/u- and DM    HPI  1. Type 2 diabetes mellitus without complication, without long-term current use of insulin (HCC) - been off metformin for several wks w/ no improvement in GI sxs so will restart. Cont glipizide though may need to decrease dose as a1c at goal - cut in half if any hypoglycemic episodes  2. Essential hypertension - has been out of the irbesartan-hctz for several days - resume, repeat bmp at f/u  3. Mixed hyperlipidemia   4. Paroxysmal atrial fibrillation (HCC)   5. Medication monitoring encounter   6. 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 . .  Ddx also includes renal abnml or partial sbo from adhesions from prior infexn.  Needs CT to eval ASAP. HOC neg in office x 1 - check home cards as well to confirm.  7. Urinary frequency   8. Urinary urgency   9. Right flank pain - UA grossly abnml though no concern for infxn - needs eval of renal to exclude cyst, tumor, or other physical kidney abnml causing pain  10. History of abdominal abscess    Need to r/o ovraian abnml.  Past Medical History:  Diagnosis Date  . Anemia   . Bradycardia   . Cataract   . Chronic kidney disease   . Diabetes mellitus type II   . Hypertension   . Osteoporosis   . Syncope   . Weakness    Past Surgical History:  Procedure Laterality Date  . ABDOMINAL HYSTERECTOMY    . CESAREAN SECTION    . EYE SURGERY     Current Outpatient Prescriptions on File Prior to Visit  Medication  Sig Dispense Refill  . acetaminophen (TYLENOL) 500 MG tablet Take 1 tablet (500 mg total) by mouth every 6 (six) hours as needed. 30 tablet 0  . Alcohol Swabs (B-D SINGLE USE SWABS REGULAR) PADS USE TO CHECK BLOOD SUGAR DAILY  100 each 3  . Blood Glucose Calibration (SURECHEK CONTROL SOLUTION) Normal LIQD Use to check blood sugar daily. Dx E11.65 1 each 3  . blood glucose meter kit and supplies Use to test blood sugar once daily. Dx: E11.65 1 each 0  . Blood Glucose Monitoring Suppl (TRUE METRIX AIR GLUCOSE METER) DEVI 1 each by Does not apply route daily. Use to test blood sugar once daily. Dx: E11.65 1 Device 0  . flecainide (TAMBOCOR) 100 MG tablet Take 0.5 tablets (50 mg total) by mouth 2 (two) times daily. 180 tablet 3  . glipiZIDE (GLUCOTROL) 10 MG tablet TAKE 1 TABLET TWICE DAILY BEFORE A MEAL 180 tablet 0  . irbesartan-hydrochlorothiazide (AVALIDE) 300-12.5 MG tablet Take 1 tablet by mouth daily. 30 tablet 0  . metFORMIN (GLUCOPHAGE-XR) 500 MG 24 hr tablet TAKE 2 TABLETS EVERY DAY WITH BREAKFAST 180 tablet 1  . PAZEO 0.7 % SOLN Place 1 drop into both eyes daily.  11  . polyethylene glycol powder (GLYCOLAX/MIRALAX) powder Take  17 g by mouth 2 (two) times daily as needed. 500 g 1  . rivaroxaban (XARELTO) 20 MG TABS tablet Take 1 tablet (20 mg total) by mouth daily with supper. 90 tablet 3  . simvastatin (ZOCOR) 20 MG tablet Take 1 tablet (20 mg total) by mouth daily at 6 PM. 90 tablet 0  . TRUE METRIX BLOOD GLUCOSE TEST test strip USE TO TEST BLOOD SUGAR ONCE DAILY. 100 each 3  . TRUEPLUS LANCETS 28G MISC USE TO TEST BLOOD SUGAR ONCE DAILY.  100 each 3   No current facility-administered medications on file prior to visit.    Allergies  Allergen Reactions  . Codeine Other (See Comments)    vertigo   Family History  Problem Relation Age of Onset  . Diabetes Sister   . Hyperlipidemia Sister   . Hypertension Sister   . Diabetes Brother   . Hyperlipidemia Brother   . Hypertension  Brother    Social History   Social History  . Marital status: Widowed    Spouse name: N/A  . Number of children: N/A  . Years of education: N/A   Social History Main Topics  . Smoking status: Never Smoker  . Smokeless tobacco: Never Used  . Alcohol use No  . Drug use: No  . Sexual activity: Not Asked   Other Topics Concern  . None   Social History Narrative  . None   Depression screen [facility] 2/9 02/24/2017 02/14/2017 11/19/2016 09/30/2016 09/30/2016  Decreased Interest 0 0 0 0 0  Down, Depressed, Hopeless 0 0 0 0 0  PHQ - 2 Score 0 0 0 0 0    Review of Systems  Constitutional: Positive for activity change and appetite change.  Gastrointestinal: Positive for abdominal distention, abdominal pain, constipation and nausea. Negative for anal bleeding, blood in stool, diarrhea and vomiting.  Hematological: Negative for adenopathy.  Psychiatric/Behavioral: Positive for sleep disturbance.   See hpi    Objective:   Physical Exam  Constitutional: She is oriented to person, place, and time. She appears well-developed and well-nourished. No distress.  HENT:  Head: Normocephalic and atraumatic.  Neck: Normal range of motion. Neck supple. No thyromegaly present.  Cardiovascular: Normal rate, regular rhythm, normal heart sounds and intact distal pulses.   Pulmonary/Chest: Effort normal and breath sounds normal. No respiratory distress.  Abdominal: She exhibits distension. She exhibits no mass. Bowel sounds are increased. There is no hepatosplenomegaly. There is tenderness in the suprapubic area and left lower quadrant. There is no rigidity, no rebound, no guarding, no CVA tenderness, no tenderness at McBurney's point and negative Murphy's sign.  Musculoskeletal: She exhibits no edema.  Lymphadenopathy:    She has no cervical adenopathy.  Neurological: She is alert and oriented to person, place, and time.  Skin: Skin is warm and dry. She is not diaphoretic. No erythema.  Psychiatric: She has  a normal mood and affect. Her behavior is normal.     BP (!) 174/81 (BP Location: Left Arm, Patient Position: Sitting, Cuff Size: Normal)   Pulse 60   Temp 98.4 F (36.9 C) (Oral)   Resp 18   Ht 5' 3.07" (1.602 m)   Wt 189 lb (85.7 kg)   SpO2 97%   BMI 33.40 kg/m      Assessment & Plan:   1. Right lower quadrant abdominal pain   2. Urine abnormality   3. Medication monitoring encounter   4. Hematuria, unspecified type   5. Abdominal distension   6.  Constipation, unspecified constipation type - bottle of mag citrate and resume miralax.  7. Acute cystitis without hematuria      Orders Placed This Encounter  Procedures  . Urine Culture  . DG Abd 1 View    Standing Status:   Future    Number of Occurrences:   1    Standing Expiration Date:   02/24/2018    Order Specific Question:   Reason for Exam (SYMPTOM  OR DIAGNOSIS REQUIRED)    Answer:   increasing abdominal distension, persistent RLQ pain, hematuria    Order Specific Question:   Preferred imaging location?    Answer:   External  . Basic metabolic panel    Order Specific Question:   Has the patient fasted?    Answer:   No  . Urinalysis, microscopic only  . Specimen Status  . POCT urinalysis dipstick     Catherine Joseph, M.D.  Primary Care at [facility] [place], [place] [address] [phone] phone [phone] fax  03/02/17 11:17 PM  Results for orders placed or performed in visit on 02/24/17  Urine Culture  Result Value Ref Range   Urine Culture, Routine Final report (A)    Organism ID, Bacteria Comment (A)    ORGANISM ID, BACTERIA Proteus mirabilis (A)    Antimicrobial Susceptibility Comment   Basic metabolic panel  Result Value Ref Range   Glucose 112 (H) 65 - 99 mg/dL   BUN 15 8 - 27 mg/dL   Creatinine, Ser 1.04 (H) 0.57 - 1.00 mg/dL   GFR calc non Af Amer 52 (L) >59 mL/min/1.73   GFR calc Af Amer 59 (L) >59 mL/min/1.73   BUN/Creatinine Ratio 14 12 - 28   Sodium 142 134 -  144 mmol/L   Potassium 4.2 3.5 - 5.2 mmol/L   Chloride 101 96 - 106 mmol/L   CO2 25 20 - 29 mmol/L   Calcium 9.8 8.7 - 10.3 mg/dL  Specimen Status  Result Value Ref Range   WBC WILL FOLLOW    RBC WILL FOLLOW    Hemoglobin WILL FOLLOW    Hematocrit WILL FOLLOW    MCV WILL FOLLOW    MCH WILL FOLLOW    MCHC WILL FOLLOW    RDW WILL FOLLOW    Platelets WILL FOLLOW    Neutrophils WILL FOLLOW    Lymphs WILL FOLLOW    Monocytes WILL FOLLOW    Eos WILL FOLLOW    Basos WILL FOLLOW    Neutrophils Absolute WILL FOLLOW    Lymphocytes Absolute WILL FOLLOW    Monocytes Absolute WILL FOLLOW    EOS (ABSOLUTE) WILL FOLLOW    Basophils Absolute WILL FOLLOW    Immature Granulocytes WILL FOLLOW    Immature Grans (Abs) WILL FOLLOW   POCT urinalysis dipstick  Result Value Ref Range   Color, UA yellow yellow   Clarity, UA clear clear   Glucose, UA negative negative mg/dL   Bilirubin, UA negative negative   Ketones, POC UA negative negative mg/dL   Spec Grav, UA 1.020 1.010 - 1.025   Blood, UA small (A) negative   pH, UA 8.0 5.0 - 8.0   Protein Ur, POC negative negative mg/dL   Urobilinogen, UA 0.2 0.2 or 1.0 E.U./dL   Nitrite, UA Negative Negative   Leukocytes, UA Trace (A) Negative

## 2017-02-25 LAB — POC HEMOCCULT BLD/STL (HOME/3-CARD/SCREEN)
FECAL OCCULT BLD: NEGATIVE
FECAL OCCULT BLD: NEGATIVE
Fecal Occult Blood, POC: NEGATIVE

## 2017-02-25 NOTE — Addendum Note (Signed)
Addended by: Gari Crown D on: 02/25/2017 10:57 AM   Modules accepted: Orders

## 2017-02-26 LAB — SPECIMEN STATUS

## 2017-02-28 ENCOUNTER — Ambulatory Visit
Admission: RE | Admit: 2017-02-28 | Discharge: 2017-02-28 | Disposition: A | Discharge status: Home / Self Care | Payer: Medicare HMO | Source: Ambulatory Visit | Attending: Family Medicine | Admitting: Family Medicine

## 2017-02-28 DIAGNOSIS — R1031 Right lower quadrant pain: Secondary | ICD-10-CM | POA: Diagnosis not present

## 2017-02-28 LAB — BASIC METABOLIC PANEL
BUN/Creatinine Ratio: 14 (ref 12–28)
BUN: 15 mg/dL (ref 8–27)
CO2: 25 mmol/L (ref 20–29)
CREATININE: 1.04 mg/dL — AB (ref 0.57–1.00)
Calcium: 9.8 mg/dL (ref 8.7–10.3)
Chloride: 101 mmol/L (ref 96–106)
GFR calc Af Amer: 59 mL/min/{1.73_m2} — ABNORMAL LOW (ref >59)
GFR calc non Af Amer: 52 mL/min/{1.73_m2} — ABNORMAL LOW (ref >59)
GLUCOSE: 112 mg/dL — AB (ref 65–99)
Potassium: 4.2 mmol/L (ref 3.5–5.2)
Sodium: 142 mmol/L (ref 134–144)

## 2017-02-28 MED ORDER — IOPAMIDOL (ISOVUE-370) INJECTION 76%
100.0000 mL | Freq: Once | INTRAVENOUS | Status: AC | PRN
  Administered 2017-02-28: 100 mL via INTRAVENOUS

## 2017-03-02 LAB — URINE CULTURE

## 2017-03-02 MED ORDER — CIPROFLOXACIN HCL 500 MG PO TABS: 500.0000 mg | ORAL_TABLET | Freq: Two times a day (BID) | ORAL | 0 refills | Status: DC

## 2017-03-15 ENCOUNTER — Other Ambulatory Visit: Payer: Self-pay | Admitting: Family Medicine

## 2017-03-15 DIAGNOSIS — E785 Hyperlipidemia, unspecified: Secondary | ICD-10-CM

## 2017-03-24 ENCOUNTER — Ambulatory Visit (INDEPENDENT_AMBULATORY_CARE_PROVIDER_SITE_OTHER): Payer: Medicare HMO | Admitting: Family Medicine

## 2017-03-24 ENCOUNTER — Encounter: Payer: Self-pay | Admitting: Family Medicine

## 2017-03-24 VITALS — BP 138/70 | HR 64 | Resp 16 | Ht 63.7 in | Wt 189.0 lb

## 2017-03-24 DIAGNOSIS — Z23 Encounter for immunization: Secondary | ICD-10-CM

## 2017-03-24 DIAGNOSIS — IMO0001 Reserved for inherently not codable concepts without codable children: Secondary | ICD-10-CM

## 2017-03-24 DIAGNOSIS — I10 Essential (primary) hypertension: Secondary | ICD-10-CM | POA: Diagnosis not present

## 2017-03-24 DIAGNOSIS — K5900 Constipation, unspecified: Secondary | ICD-10-CM

## 2017-03-24 DIAGNOSIS — R42 Dizziness and giddiness: Secondary | ICD-10-CM

## 2017-03-24 DIAGNOSIS — E782 Mixed hyperlipidemia: Secondary | ICD-10-CM

## 2017-03-24 DIAGNOSIS — E785 Hyperlipidemia, unspecified: Secondary | ICD-10-CM | POA: Diagnosis not present

## 2017-03-24 DIAGNOSIS — E119 Type 2 diabetes mellitus without complications: Secondary | ICD-10-CM

## 2017-03-24 DIAGNOSIS — Z5181 Encounter for therapeutic drug level monitoring: Secondary | ICD-10-CM | POA: Diagnosis not present

## 2017-03-24 DIAGNOSIS — E1165 Type 2 diabetes mellitus with hyperglycemia: Secondary | ICD-10-CM | POA: Diagnosis not present

## 2017-03-24 DIAGNOSIS — N390 Urinary tract infection, site not specified: Secondary | ICD-10-CM | POA: Diagnosis not present

## 2017-03-24 LAB — POCT URINALYSIS DIP (MANUAL ENTRY)
BILIRUBIN UA: NEGATIVE
BILIRUBIN UA: NEGATIVE mg/dL
GLUCOSE UA: NEGATIVE mg/dL
LEUKOCYTES UA: NEGATIVE
Nitrite, UA: NEGATIVE
Protein Ur, POC: NEGATIVE mg/dL
Spec Grav, UA: 1.025 (ref 1.010–1.025)
Urobilinogen, UA: 0.2 E.U./dL
pH, UA: 6 (ref 5.0–8.0)

## 2017-03-24 LAB — POC MICROSCOPIC URINALYSIS (UMFC): MUCUS RE: ABSENT

## 2017-03-24 LAB — POCT CBC
Granulocyte percent: 47.6 %G (ref 37–80)
HEMATOCRIT: 41.4 % (ref 37.7–47.9)
Hemoglobin: 13.6 g/dL (ref 12.2–16.2)
LYMPH, POC: 2 (ref 0.6–3.4)
MCH, POC: 27.5 pg (ref 27–31.2)
MCHC: 32.8 g/dL (ref 31.8–35.4)
MCV: 84 fL (ref 80–97)
MID (cbc): 0.3 (ref 0–0.9)
MPV: 8.9 fL (ref 0–99.8)
POC GRANULOCYTE: 2.1 (ref 2–6.9)
POC LYMPH %: 44.5 % (ref 10–50)
POC MID %: 7.9 % (ref 0–12)
Platelet Count, POC: 173 10*3/uL (ref 142–424)
RBC: 4.93 M/uL (ref 4.04–5.48)
RDW, POC: 14.6 %
WBC: 4.4 10*3/uL — AB (ref 4.6–10.2)

## 2017-03-24 MED ORDER — IRBESARTAN-HYDROCHLOROTHIAZIDE 300-12.5 MG PO TABS: 1.0000 | ORAL_TABLET | Freq: Every day | ORAL | 0 refills | Status: DC

## 2017-03-24 MED ORDER — SIMVASTATIN 20 MG PO TABS: 20.0000 mg | ORAL_TABLET | Freq: Every day | ORAL | 0 refills | Status: DC

## 2017-03-24 MED ORDER — GLIPIZIDE 10 MG PO TABS: ORAL_TABLET | ORAL | 0 refills | Status: DC

## 2017-03-24 MED ORDER — CLONIDINE HCL 0.1 MG PO TABS: 0.1000 mg | ORAL_TABLET | Freq: Every day | ORAL | 0 refills | Status: DC

## 2017-03-24 NOTE — Patient Instructions (Signed)
Managing Your Hypertension Hypertension is commonly called high blood pressure. This is when the force of your blood pressing against the walls of your arteries is too strong. Arteries are blood vessels that carry blood from your heart throughout your body. Hypertension forces the heart to work harder to pump blood, and may cause the arteries to become narrow or stiff. Having untreated or uncontrolled hypertension can cause heart attack, stroke, kidney disease, and other problems. What are blood pressure readings? A blood pressure reading consists of a higher number over a lower number. Ideally, your blood pressure should be below 120/80. The first ("top") number is called the systolic pressure. It is a measure of the pressure in your arteries as your heart beats. The second ("bottom") number is called the diastolic pressure. It is a measure of the pressure in your arteries as the heart relaxes. What does my blood pressure reading mean? Blood pressure is classified into four stages. Based on your blood pressure reading, your health care provider may use the following stages to determine what type of treatment you need, if any. Systolic pressure and diastolic pressure are measured in a unit called mm Hg. Normal  Systolic pressure: below 120.  Diastolic pressure: below 80. Elevated  Systolic pressure: 120-129.  Diastolic pressure: below 80. Hypertension stage 1  Systolic pressure: 130-139.  Diastolic pressure: 80-89. Hypertension stage 2  Systolic pressure: 140 or above.  Diastolic pressure: 90 or above. What health risks are associated with hypertension? Managing your hypertension is an important responsibility. Uncontrolled hypertension can lead to:  A heart attack.  A stroke.  A weakened blood vessel (aneurysm).  Heart failure.  Kidney damage.  Eye damage.  Metabolic syndrome.  Memory and concentration problems.  What changes can I make to manage my  hypertension? Hypertension can be managed by making lifestyle changes and possibly by taking medicines. Your health care provider will help you make a plan to bring your blood pressure within a normal range. Eating and drinking  Eat a diet that is high in fiber and potassium, and low in salt (sodium), added sugar, and fat. An example eating plan is called the DASH (Dietary Approaches to Stop Hypertension) diet. To eat this way: ? Eat plenty of fresh fruits and vegetables. Try to fill half of your plate at each meal with fruits and vegetables. ? Eat whole grains, such as whole wheat pasta, brown rice, or whole grain bread. Fill about one quarter of your plate with whole grains. ? Eat low-fat diary products. ? Avoid fatty cuts of meat, processed or cured meats, and poultry with skin. Fill about one quarter of your plate with lean proteins such as fish, chicken without skin, beans, eggs, and tofu. ? Avoid premade and processed foods. These tend to be higher in sodium, added sugar, and fat.  Reduce your daily sodium intake. Most people with hypertension should eat less than 1,500 mg of sodium a day.  Limit alcohol intake to no more than 1 drink a day for nonpregnant women and 2 drinks a day for men. One drink equals 12 oz of beer, 5 oz of wine, or 1 oz of hard liquor. Lifestyle  Work with your health care provider to maintain a healthy body weight, or to lose weight. Ask what an ideal weight is for you.  Get at least 30 minutes of exercise that causes your heart to beat faster (aerobic exercise) most days of the week. Activities may include walking, swimming, or biking.  Include exercise   to strengthen your muscles (resistance exercise), such as weight lifting, as part of your weekly exercise routine. Try to do these types of exercises for 30 minutes at least 3 days a week.  Do not use any products that contain nicotine or tobacco, such as cigarettes and e-cigarettes. If you need help quitting, ask  your health care provider.  Control any long-term (chronic) conditions you have, such as high cholesterol or diabetes. Monitoring  Monitor your blood pressure at home as told by your health care provider. Your personal target blood pressure may vary depending on your medical conditions, your age, and other factors.  Have your blood pressure checked regularly, as often as told by your health care provider. Working with your health care provider  Review all the medicines you take with your health care provider because there may be side effects or interactions.  Talk with your health care provider about your diet, exercise habits, and other lifestyle factors that may be contributing to hypertension.  Visit your health care provider regularly. Your health care provider can help you create and adjust your plan for managing hypertension. Will I need medicine to control my blood pressure? Your health care provider may prescribe medicine if lifestyle changes are not enough to get your blood pressure under control, and if:  Your systolic blood pressure is 130 or higher.  Your diastolic blood pressure is 80 or higher.  Take medicines only as told by your health care provider. Follow the directions carefully. Blood pressure medicines must be taken as prescribed. The medicine does not work as well when you skip doses. Skipping doses also puts you at risk for problems. Contact a health care provider if:  You think you are having a reaction to medicines you have taken.  You have repeated (recurrent) headaches.  You feel dizzy.  You have swelling in your ankles.  You have trouble with your vision. Get help right away if:  You develop a severe headache or confusion.  You have unusual weakness or numbness, or you feel faint.  You have severe pain in your chest or abdomen.  You vomit repeatedly.  You have trouble breathing. Summary  Hypertension is when the force of blood pumping through  your arteries is too strong. If this condition is not controlled, it may put you at risk for serious complications.  Your personal target blood pressure may vary depending on your medical conditions, your age, and other factors. For most people, a normal blood pressure is less than 120/80.  Hypertension is managed by lifestyle changes, medicines, or both. Lifestyle changes include weight loss, eating a healthy, low-sodium diet, exercising more, and limiting alcohol. This information is not intended to replace advice given to you by your health care provider. Make sure you discuss any questions you have with your health care provider. Document Released: 03/01/2012 Document Revised: 05/05/2016 Document Reviewed: 05/05/2016 [employer] Interactive Patient Education  2018 [employer]  

## 2017-03-24 NOTE — Progress Notes (Signed)
Subjective:    Patient ID: Catherine Joseph, female    DOB: 1940/10/03, 76 y.o.   MRN: [id number] Chief Complaint  Patient presents with  . Hypertension    patient present to office c/o high blood pressure, states that medication is not helping. Patient states that she has constant headache and some dizziness   HPI HA is in her neck and behind her eyes 15-30 min and relieves spontaneously.  BP at home always 170-180 so thinks it is the cuff.  cbgs 91-106, 97 Feels presyncopal when she wakes in the morning as sleep poorly.  Her sister passed away in January 10, 2023 - her sister passed away on after she talked to her at 10:30 the night before from ovarian cancer - since then she doesn 't feel well.  Talked to her at night every day for 5 years.  Pt is s/p hysterectomy. Still has her cervix.   Consitaption has resolved.    DMII: Diagnosed .   Lab Results  Component Value Date   HGBA1C 6.4 02/14/2017   HGBA1C 6.8 09/30/2016   HGBA1C 6.6 07/03/2016   CBGs: fasting a.m. ; after meal  ; No hypoglycemic episodes.  Meter type:  Diet:  Exercising:  DM Med Regimen: Prior changes:   eGFR:  Baseline Cr:  Last checked . Microalb: Done 09/30/2016. Normal. On acei Lipids:  LDL ,  non-HDL .  Last levels done  - were improving from prior. On statin. Taking asa 81 qd.  Optho: Seen annually by - last exam  Feet: Monofilament exam done . Denies any no problems.  Not seen by podiatry prior.  Immunizations:  Influenza:  Pneumovax-23:   1. Type 2 diabetes mellitus without complication, without long-term current use of insulin (HCC) - been off metformin for several wks w/ no improvement in GI sxs so will restart. Cont glipizide though may need to decrease dose as a1c at goal - cut in half if any hypoglycemic episodes  2. Essential hypertension - has been out of the irbesartan-hctz for several days - resume, repeat bmp at f/u  3. Mixed hyperlipidemia   4. Paroxysmal atrial fibrillation (HCC)   5.  Medication monitoring encounter   6. 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 . .  Ddx also includes renal abnml or partial sbo from adhesions from prior infexn.  Needs CT to eval ASAP. HOC neg in office x 1 - check home cards as well to confirm.  7. Urinary frequency   8. Urinary urgency   9. Right flank pain - UA grossly abnml though no concern for infxn - needs eval of renal to exclude cyst, tumor, or other physical kidney abnml causing pain  10. History of abdominal abscess    Need to r/o ovraian abnml.  Past Medical History:  Diagnosis Date  . Anemia   . Bradycardia   . Cataract   . Chronic kidney disease   . Diabetes mellitus type II   . Hypertension   . Osteoporosis   . Syncope   . Weakness    Past Surgical History:  Procedure Laterality Date  . ABDOMINAL  HYSTERECTOMY    . CESAREAN SECTION    . EYE SURGERY     Current Outpatient Prescriptions on File Prior to Visit  Medication Sig Dispense Refill  . Alcohol Swabs (B-D SINGLE USE SWABS REGULAR) PADS USE TO CHECK BLOOD SUGAR DAILY  100 each 3  . Blood Glucose Calibration (SURECHEK CONTROL SOLUTION) Normal LIQD Use to check blood sugar daily. Dx E11.65 1 each 3  . blood glucose meter kit and supplies Use to test blood sugar once daily. Dx: E11.65 1 each 0  . Blood Glucose Monitoring Suppl (TRUE METRIX AIR GLUCOSE METER) DEVI 1 each by Does not apply route daily. Use to test blood sugar once daily. Dx: E11.65 1 Device 0  . flecainide (TAMBOCOR) 100 MG tablet Take 0.5 tablets (50 mg total) by mouth 2 (two) times daily. 180 tablet 3  . glipiZIDE (GLUCOTROL) 10 MG tablet TAKE 1  TABLET TWICE DAILY BEFORE A MEAL 180 tablet 0  . irbesartan-hydrochlorothiazide (AVALIDE) 300-12.5 MG tablet Take 1 tablet by mouth daily. 30 tablet 0  . metFORMIN (GLUCOPHAGE-XR) 500 MG 24 hr tablet TAKE 2 TABLETS EVERY DAY WITH BREAKFAST 180 tablet 1  . PAZEO 0.7 % SOLN Place 1 drop into both eyes daily.  11  . polyethylene glycol powder (GLYCOLAX/MIRALAX) powder Take 17 g by mouth 2 (two) times daily as needed. 500 g 1  . rivaroxaban (XARELTO) 20 MG TABS tablet Take 1 tablet (20 mg total) by mouth daily with supper. 90 tablet 3  . simvastatin (ZOCOR) 20 MG tablet Take 1 tablet (20 mg total) by mouth daily at 6 PM. 90 tablet 0  . TRUE METRIX BLOOD GLUCOSE TEST test strip USE TO TEST BLOOD SUGAR ONCE DAILY. 100 each 3  . TRUEPLUS LANCETS 28G MISC USE TO TEST BLOOD SUGAR ONCE DAILY.  100 each 3  . acetaminophen (TYLENOL) 500 MG tablet Take 1 tablet (500 mg total) by mouth every 6 (six) hours as needed. (Patient not taking: Reported on 03/24/2017) 30 tablet 0  . ciprofloxacin (CIPRO) 500 MG tablet Take 1 tablet (500 mg total) by mouth 2 (two) times daily. (Patient not taking: Reported on 03/24/2017) 10 tablet 0   No current facility-administered medications on file prior to visit.    Allergies  Allergen Reactions  . Codeine Other (See Comments)    vertigo   Family History  Problem Relation Age of Onset  . Diabetes Sister   . Hyperlipidemia Sister   . Hypertension Sister   . Diabetes Brother   . Hyperlipidemia Brother   . Hypertension Brother    Social History   Social History  . Marital status: Widowed    Spouse name: N/A  . Number of children: N/A  . Years of education: N/A   Social History Main Topics  . Smoking status: Never Smoker  . Smokeless tobacco: Never Used  . Alcohol use No  . Drug use: No  . Sexual activity: Not Asked   Other Topics Concern  . None   Social History Narrative  . None   Depression screen [facility] 2/9 03/24/2017 02/24/2017 02/14/2017 11/19/2016 09/30/2016    Decreased Interest 0 0 0 0 0  Down, Depressed, Hopeless 0 0 0 0 0  PHQ - 2 Score 0 0 0 0 0    Review of Systems  Constitutional: Positive for activity change and appetite change.  Gastrointestinal: Positive for abdominal distention, abdominal pain, constipation and nausea. Negative for anal bleeding, blood in stool, diarrhea and vomiting.  Hematological:  Negative for adenopathy.  Psychiatric/Behavioral: Positive for sleep disturbance.   See hpi    Objective:   Physical Exam  Constitutional: She is oriented to person, place, and time. She appears well-developed and well-nourished. No distress.  HENT:  Head: Normocephalic and atraumatic.  Neck: Normal range of motion. Neck supple. No thyromegaly present.  Cardiovascular: Normal rate, regular rhythm, normal heart sounds and intact distal pulses.   Pulmonary/Chest: Effort normal and breath sounds normal. No respiratory distress.  Abdominal: She exhibits distension. She exhibits no mass. Bowel sounds are increased. There is no hepatosplenomegaly. There is tenderness in the suprapubic area and left lower quadrant. There is no rigidity, no rebound, no guarding, no CVA tenderness, no tenderness at McBurney's point and negative Murphy's sign.  Musculoskeletal: She exhibits no edema.  Lymphadenopathy:    She has no cervical adenopathy.  Neurological: She is alert and oriented to person, place, and time.  Skin: Skin is warm and dry. She is not diaphoretic. No erythema.  Psychiatric: She has a normal mood and affect. Her behavior is normal.     BP 138/70 (BP Location: Right Arm, Patient Position: Sitting, Cuff Size: Normal)   Pulse 64   Resp 16   Ht 5' 3.7" (1.618 m)   Wt 189 lb (85.7 kg)   SpO2 98%   BMI 32.75 kg/m      Assessment & Plan:  Ok to take tylenol.  Keep med for itching on list  1. Constipation, unspecified constipation type   2. Recurrent UTI   3. Type 2 diabetes mellitus without complication, without long-term  current use of insulin ([place])   4. Essential hypertension   5. Mixed hyperlipidemia   6. Dizziness and giddiness   7. Medication monitoring encounter   8. Need for prophylactic vaccination and inoculation against influenza   9. Uncontrolled type 2 diabetes mellitus without complication, without long-term current use of insulin ([place])   10. Dyslipidemia     Orders Placed This Encounter  Procedures  . Flu Vaccine QUAD 36+ mos IM  . Comprehensive metabolic panel  . Orthostatic vital signs  . POCT urinalysis dipstick  . POCT Microscopic Urinalysis ([facility])  . POCT CBC    Meds ordered this encounter  Medications  . DISCONTD: cloNIDine (CATAPRES) 0.1 MG tablet    Sig: Take 1 tablet (0.1 mg total) by mouth at bedtime.    Dispense:  90 tablet    Refill:  0  . irbesartan-hydrochlorothiazide (AVALIDE) 300-12.5 MG tablet    Sig: Take 1 tablet by mouth daily.    Dispense:  90 tablet    Refill:  0  . glipiZIDE (GLUCOTROL) 10 MG tablet    Sig: TAKE 1 TABLET TWICE DAILY BEFORE A MEAL    Dispense:  180 tablet    Refill:  0  . simvastatin (ZOCOR) 20 MG tablet    Sig: Take 1 tablet (20 mg total) by mouth daily at 6 PM.    Dispense:  90 tablet    Refill:  0  . cloNIDine (CATAPRES) 0.1 MG tablet    Sig: Take 1 tablet (0.1 mg total) by mouth at bedtime.    Dispense:  30 tablet    Refill:  0    Delman Cheadle, M.D.  [facility] [address] [place], [place] [address] [phone] phone [phone] fax  03/24/17 10:47 AM  Results for orders placed or performed in visit on 03/24/17  Comprehensive metabolic panel  Result Value Ref  Range   Glucose 162 (H) 65 - 99 mg/dL   BUN 16 8 - 27 mg/dL   Creatinine, Ser 1.19 (H) 0.57 - 1.00 mg/dL   GFR calc non Af Amer 44 (L) >59 mL/min/1.73   GFR calc Af Amer 51 (L) >59 mL/min/1.73   BUN/Creatinine Ratio 13 12 - 28   Sodium 143 134 - 144 mmol/L   Potassium 4.2 3.5 - 5.2 mmol/L   Chloride 99 96 - 106 mmol/L   CO2 26 20 -  29 mmol/L   Calcium 9.8 8.7 - 10.3 mg/dL   Total Protein 8.1 6.0 - 8.5 g/dL   Albumin 4.6 3.5 - 4.8 g/dL   Globulin, Total 3.5 1.5 - 4.5 g/dL   Albumin/Globulin Ratio 1.3 1.2 - 2.2   Bilirubin Total 0.6 0.0 - 1.2 mg/dL   Alkaline Phosphatase 107 39 - 117 IU/L   AST 19 0 - 40 IU/L   ALT 13 0 - 32 IU/L  POCT urinalysis dipstick  Result Value Ref Range   Color, UA yellow yellow   Clarity, UA clear clear   Glucose, UA negative negative mg/dL   Bilirubin, UA negative negative   Ketones, POC UA negative negative mg/dL   Spec Grav, UA 1.025 1.010 - 1.025   Blood, UA moderate (A) negative   pH, UA 6.0 5.0 - 8.0   Protein Ur, POC negative negative mg/dL   Urobilinogen, UA 0.2 0.2 or 1.0 E.U./dL   Nitrite, UA Negative Negative   Leukocytes, UA Negative Negative  POCT Microscopic Urinalysis ([facility])  Result Value Ref Range   WBC,UR,HPF,POC None None WBC/hpf   RBC,UR,HPF,POC Few (A) None RBC/hpf   Bacteria None None, Too numerous to count   Mucus Absent Absent   Epithelial Cells, UR Per Microscopy Few (A) None, Too numerous to count cells/hpf  POCT CBC  Result Value Ref Range   WBC 4.4 (A) 4.6 - 10.2 K/uL   Lymph, poc 2.0 0.6 - 3.4   POC LYMPH PERCENT 44.5 10 - 50 %L   MID (cbc) 0.3 0 - 0.9   POC MID % 7.9 0 - 12 %M   POC Granulocyte 2.1 2 - 6.9   Granulocyte percent 47.6 37 - 80 %G   RBC 4.93 4.04 - 5.48 M/uL   Hemoglobin 13.6 12.2 - 16.2 g/dL   HCT, POC 41.4 37.7 - 47.9 %   MCV 84.0 80 - 97 fL   MCH, POC 27.5 27 - 31.2 pg   MCHC 32.8 31.8 - 35.4 g/dL   RDW, POC 14.6 %   Platelet Count, POC 173 142 - 424 K/uL   MPV 8.9 0 - 99.8 fL

## 2017-03-25 LAB — COMPREHENSIVE METABOLIC PANEL
ALT: 13 IU/L (ref 0–32)
AST: 19 IU/L (ref 0–40)
Albumin/Globulin Ratio: 1.3 (ref 1.2–2.2)
Albumin: 4.6 g/dL (ref 3.5–4.8)
Alkaline Phosphatase: 107 IU/L (ref 39–117)
BUN/Creatinine Ratio: 13 (ref 12–28)
BUN: 16 mg/dL (ref 8–27)
Bilirubin Total: 0.6 mg/dL (ref 0.0–1.2)
CALCIUM: 9.8 mg/dL (ref 8.7–10.3)
CO2: 26 mmol/L (ref 20–29)
CREATININE: 1.19 mg/dL — AB (ref 0.57–1.00)
Chloride: 99 mmol/L (ref 96–106)
GFR calc Af Amer: 51 mL/min/{1.73_m2} — ABNORMAL LOW (ref >59)
GFR, EST NON AFRICAN AMERICAN: 44 mL/min/{1.73_m2} — AB (ref >59)
GLOBULIN, TOTAL: 3.5 g/dL (ref 1.5–4.5)
GLUCOSE: 162 mg/dL — AB (ref 65–99)
Potassium: 4.2 mmol/L (ref 3.5–5.2)
SODIUM: 143 mmol/L (ref 134–144)
Total Protein: 8.1 g/dL (ref 6.0–8.5)

## 2017-04-08 ENCOUNTER — Emergency Department (HOSPITAL_COMMUNITY): Payer: Medicare HMO

## 2017-04-08 ENCOUNTER — Emergency Department (HOSPITAL_COMMUNITY)
Admission: EM | Admit: 2017-04-08 | Discharge: 2017-04-08 | Disposition: A | Discharge status: Home / Self Care | Payer: Medicare HMO | Attending: Emergency Medicine | Admitting: Emergency Medicine

## 2017-04-08 ENCOUNTER — Encounter (HOSPITAL_COMMUNITY): Payer: Self-pay | Admitting: Emergency Medicine

## 2017-04-08 ENCOUNTER — Ambulatory Visit: Payer: Medicare HMO | Admitting: Family Medicine

## 2017-04-08 DIAGNOSIS — Z79899 Other long term (current) drug therapy: Secondary | ICD-10-CM | POA: Diagnosis not present

## 2017-04-08 DIAGNOSIS — E119 Type 2 diabetes mellitus without complications: Secondary | ICD-10-CM | POA: Insufficient documentation

## 2017-04-08 DIAGNOSIS — R42 Dizziness and giddiness: Secondary | ICD-10-CM | POA: Insufficient documentation

## 2017-04-08 DIAGNOSIS — R404 Transient alteration of awareness: Secondary | ICD-10-CM | POA: Diagnosis not present

## 2017-04-08 DIAGNOSIS — Z7984 Long term (current) use of oral hypoglycemic drugs: Secondary | ICD-10-CM | POA: Insufficient documentation

## 2017-04-08 DIAGNOSIS — R519 Headache, unspecified: Secondary | ICD-10-CM

## 2017-04-08 DIAGNOSIS — I10 Essential (primary) hypertension: Secondary | ICD-10-CM | POA: Insufficient documentation

## 2017-04-08 DIAGNOSIS — R51 Headache: Secondary | ICD-10-CM | POA: Diagnosis not present

## 2017-04-08 LAB — I-STAT CHEM 8, ED
BUN: 16 mg/dL (ref 6–20)
CHLORIDE: 101 mmol/L (ref 101–111)
CREATININE: 0.9 mg/dL (ref 0.44–1.00)
Calcium, Ion: 1.14 mmol/L — ABNORMAL LOW (ref 1.15–1.40)
GLUCOSE: 136 mg/dL — AB (ref 65–99)
HEMATOCRIT: 47 % — AB (ref 36.0–46.0)
Hemoglobin: 16 g/dL — ABNORMAL HIGH (ref 12.0–15.0)
POTASSIUM: 3.6 mmol/L (ref 3.5–5.1)
Sodium: 142 mmol/L (ref 135–145)
TCO2: 29 mmol/L (ref 22–32)

## 2017-04-08 LAB — URINALYSIS, ROUTINE W REFLEX MICROSCOPIC
Bacteria, UA: NONE SEEN
Bilirubin Urine: NEGATIVE
Glucose, UA: NEGATIVE mg/dL
KETONES UR: 5 mg/dL — AB
LEUKOCYTES UA: NEGATIVE
Nitrite: NEGATIVE
PROTEIN: NEGATIVE mg/dL
SQUAMOUS EPITHELIAL / LPF: NONE SEEN
Specific Gravity, Urine: 1.009 (ref 1.005–1.030)
pH: 6 (ref 5.0–8.0)

## 2017-04-08 LAB — CBC
HEMATOCRIT: 42.6 % (ref 36.0–46.0)
HEMOGLOBIN: 14.6 g/dL (ref 12.0–15.0)
MCH: 28.3 pg (ref 26.0–34.0)
MCHC: 34.3 g/dL (ref 30.0–36.0)
MCV: 82.6 fL (ref 78.0–100.0)
PLATELETS: 184 10*3/uL (ref 150–400)
RBC: 5.16 MIL/uL — AB (ref 3.87–5.11)
RDW: 14.1 % (ref 11.5–15.5)
WBC: 4.7 10*3/uL (ref 4.0–10.5)

## 2017-04-08 LAB — I-STAT TROPONIN, ED: Troponin i, poc: 0.01 ng/mL (ref 0.00–0.08)

## 2017-04-08 MED ORDER — MECLIZINE HCL 25 MG PO TABS: 25.0000 mg | ORAL_TABLET | Freq: Three times a day (TID) | ORAL | 0 refills | Status: DC | PRN

## 2017-04-08 MED ORDER — LORAZEPAM 2 MG/ML IJ SOLN
1.0000 mg | Freq: Once | INTRAMUSCULAR | Status: AC
  Administered 2017-04-08: 1 mg via INTRAVENOUS
  Filled 2017-04-08: qty 1

## 2017-04-08 MED ORDER — MECLIZINE HCL 25 MG PO TABS
25.0000 mg | ORAL_TABLET | Freq: Once | ORAL | Status: AC
  Administered 2017-04-08: 25 mg via ORAL
  Filled 2017-04-08: qty 1

## 2017-04-08 NOTE — Discharge Instructions (Signed)
Return here as needed.  Follow-up with your primary care doctor °

## 2017-04-08 NOTE — ED Notes (Signed)
Spoke with EDPA about patient's blood pressure. PA aware and states patient needs to go home and take her home BP medications

## 2017-04-08 NOTE — ED Provider Notes (Signed)
Patient ambulated without difficulty and denies any significant symptoms during that time.  I did advise her to follow up with her primary care Dr. Rockey Situ to return here as needed.  Patient agrees the plan and all questions were answered   Dalia Heading, PA-C 04/08/17 1911    Isla Pence, MD 04/09/17 [phone]

## 2017-04-08 NOTE — ED Provider Notes (Signed)
[facility] DEPT Provider Note   CSN: [id number] Arrival date & time: 04/08/17  1030     History   Chief Complaint Chief Complaint  Patient presents with  . Dizziness  . Headache    HPI ANALYSIA DUNGEE is a 76 y.o. female.  HPI   76 year old female with history of afib on Xarelto, diabetes, kidney disease, hypertension, bradycardia, recurrent syncope,  anemia brought here via EMS from home for evaluation of dizziness.  Acute worsening dizziness and headache which began yesterday morning. Sxs started when she first awoke. Unable to get out of bed yesterday due to her sxs and now having neck pain.  Sxs worse with movement of her head, describe self spinning sensation. Pt sxs has been persistent even at rest. Report nausea without vomiting.  Has photophobia yesterday but it has improve.  Report generalized weakness without focal numbness or weakness.  Pt have been compliant with her medication.  Pt lives with her daughter.  Headache is mild and describe as "hurting all over".  No CP, SOB, abd pain, dysuria.  No recent medication changes.  Last medication change was a month ago when her doctor switched her BP medication.  She report remote hx of vertigo, and remote headache.      Past Medical History:  Diagnosis Date  . Anemia   . Bradycardia   . Cataract   . Chronic kidney disease   . Diabetes mellitus type II   . Hypertension   . Osteoporosis   . Syncope   . Weakness     Patient Active Problem List   Diagnosis Date Noted  . Osteopenia 04/01/2016  . Microhematuria 11/11/2014  . Atrial fibrillation ([place]) 04/22/2014  . Chest pain 02/11/2014  . 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 Lung nodule 02/01/2012  . Mixed hyperlipidemia 08/03/2011  . Bradycardia 08/03/2011  . Dizzy spells 08/03/2011  . Hypertension 07/22/2011  . Diabetes mellitus, type II ([place]) 07/22/2011    Past Surgical History:  Procedure Laterality Date  . ABDOMINAL  HYSTERECTOMY    . CESAREAN SECTION    . EYE SURGERY      OB History    No data available       Home Medications    Prior to Admission medications   Medication Sig Start Date End Date Taking? Authorizing Provider  acetaminophen (TYLENOL) 500 MG tablet Take 1 tablet (500 mg total) by mouth every 6 (six) hours as needed. Patient not taking: Reported on 03/24/2017 11/19/16   Shawnee Knapp, MD  Alcohol Swabs (B-D SINGLE USE SWABS REGULAR) PADS USE TO CHECK BLOOD SUGAR DAILY  08/06/16   Shawnee Knapp, MD  Blood Glucose Calibration (SURECHEK CONTROL SOLUTION) Normal LIQD Use to check blood sugar daily. Dx E11.65 09/08/15   Shawnee Knapp, MD  blood glucose meter kit and supplies Use to test blood sugar once daily. Dx: E11.65 07/09/15   Copland, Gay Filler, MD  Blood Glucose Monitoring Suppl (TRUE METRIX AIR GLUCOSE METER) DEVI 1 each by Does not apply route daily. Use to test blood sugar once daily. Dx: E11.65 09/09/15   Ivar Drape D, PA  cloNIDine (CATAPRES) 0.1 MG tablet Take 1 tablet (0.1 mg total) by mouth at bedtime. 03/24/17   Shawnee Knapp, MD  flecainide (TAMBOCOR) 100 MG tablet Take 0.5 tablets (50 mg total) by mouth 2 (two) times daily. 07/22/16   Josue Hector, MD  glipiZIDE (GLUCOTROL) 10 MG tablet TAKE 1 TABLET TWICE DAILY BEFORE  A MEAL 03/24/17   Shawnee Knapp, MD  irbesartan-hydrochlorothiazide (AVALIDE) 300-12.5 MG tablet Take 1 tablet by mouth daily. 03/24/17   Shawnee Knapp, MD  metFORMIN (GLUCOPHAGE-XR) 500 MG 24 hr tablet TAKE 2 TABLETS EVERY DAY WITH BREAKFAST 02/14/17   Shawnee Knapp, MD  PAZEO 0.7 % SOLN Place 1 drop into both eyes daily. 05/24/16   [provider]  polyethylene glycol powder (GLYCOLAX/MIRALAX) powder Take 17 g by mouth 2 (two) times daily as needed. 11/19/16   Shawnee Knapp, MD  rivaroxaban (XARELTO) 20 MG TABS tablet Take 1 tablet (20 mg total) by mouth daily with supper. 07/22/16   Josue Hector, MD  simvastatin (ZOCOR) 20 MG tablet Take 1 tablet (20 mg total) by  mouth daily at 6 PM. 03/24/17   Shawnee Knapp, MD  TRUE METRIX BLOOD GLUCOSE TEST test strip USE TO TEST BLOOD SUGAR ONCE DAILY. 08/06/16   Shawnee Knapp, MD  TRUEPLUS LANCETS 28G MISC USE TO TEST BLOOD SUGAR ONCE DAILY.  08/06/16   Shawnee Knapp, MD    Family History Family History  Problem Relation Age of Onset  . Diabetes Sister   . Hyperlipidemia Sister   . Hypertension Sister   . Diabetes Brother   . Hyperlipidemia Brother   . Hypertension Brother     Social History Social History  Substance Use Topics  . Smoking status: Never Smoker  . Smokeless tobacco: Never Used  . Alcohol use No     Allergies   Codeine   Review of Systems Review of Systems  All other systems reviewed and are negative.    Physical Exam Updated Vital Signs BP (!) 169/80 (BP Location: Left Arm)   Pulse (!) 56   Temp 98.2 F (36.8 C) (Oral)   Resp 16   Ht _0  (1.6 m)   Wt 86.2 kg (190 lb)   SpO2 100%   BMI 33.66 kg/m   Physical Exam  Constitutional: She is oriented to person, place, and time. She appears well-developed and well-nourished. No distress.  HENT:  Head: Atraumatic.  Eyes: Pupils are equal, round, and reactive to light. Conjunctivae and EOM are normal.  Neck: Normal range of motion. Neck supple. No thyromegaly present.  No carotid bruits  Cardiovascular: Normal rate and regular rhythm.   Pulmonary/Chest: Effort normal and breath sounds normal.  Abdominal: Soft. Bowel sounds are normal. She exhibits no distension.  Neurological: She is alert and oriented to person, place, and time.  Neurologic exam:  Speech clear, pupils equal round reactive to light, extraocular movements intact  Normal peripheral visual fields Cranial nerves III through XII normal including no facial droop Follows commands, moves all extremities x4, normal strength to bilateral upper and lower extremities at all major muscle groups including grip Sensation normal to light touch  Coordination mostly intact,  no limb ataxia, mild right side dysmetria. Rapid alternating movements normal No pronator drift Gait not tested   Skin: No rash noted.  Psychiatric: She has a normal mood and affect.  Nursing note and vitals reviewed.    ED Treatments / Results  Labs (all labs ordered are listed, but only abnormal results are displayed) Labs Reviewed  CBC - Abnormal; Notable for the following:       Result Value   RBC 5.16 (*)    All other components within normal limits  URINALYSIS, ROUTINE W REFLEX MICROSCOPIC - Abnormal; Notable for the following:    Color, Urine STRAW (*)  Hgb urine dipstick MODERATE (*)    Ketones, ur 5 (*)    All other components within normal limits  I-STAT CHEM 8, ED - Abnormal; Notable for the following:    Glucose, Bld 136 (*)    Calcium, Ion 1.14 (*)    Hemoglobin 16.0 (*)    HCT 47.0 (*)    All other components within normal limits  I-STAT TROPONIN, ED    EKG  EKG Interpretation  Date/Time:  Friday April 08 2017 13:08:47 EDT Ventricular Rate:  60 PR Interval:    QRS Duration: 88 QT Interval:  434 QTC Calculation: 434 R Axis:   9 Text Interpretation:  Sinus rhythm Borderline T wave abnormalities No significant change since last tracing Confirmed by Isla Pence [phone]) on 04/08/2017 2:33:45 PM      Date: 04/08/2017  Rate: 60  Rhythm: normal sinus rhythm  QRS Axis: normal  Intervals: normal  ST/T Wave abnormalities: normal  Conduction Disutrbances: none  Narrative Interpretation:   Old EKG Reviewed: No significant changes noted     Radiology Ct Head Wo Contrast  Result Date: 04/08/2017 CLINICAL DATA:  Dizziness and headache EXAM: CT HEAD WITHOUT CONTRAST TECHNIQUE: Contiguous axial images were obtained from the base of the skull through the vertex without intravenous contrast. COMPARISON:  None. FINDINGS: Brain: There is mild diffuse atrophy. There is no intracranial mass, hemorrhage, extra-axial fluid collection, or midline shift. There  is patchy small vessel disease in the centra semiovale bilaterally. Elsewhere gray-white compartments appear normal. There is no evident acute infarct. Vascular: There is no appreciable hyperdense vessel. There is calcification in each carotid siphon. Skull: The bony calvarium appears intact. Sinuses/Orbits: Paranasal sinuses which are visualized are clear. Orbits appear symmetric bilaterally. Other: Mastoid air cells are clear. IMPRESSION: Mild atrophy with patchy periventricular small vessel disease. No intracranial mass, hemorrhage, or extra-axial fluid collection. No evident acute infarct. Foci of arterial vascular calcification noted. Electronically Signed   By: Lowella Grip III M.D.   On: 04/08/2017 14:50    Procedures Procedures (including critical care time)  Medications Ordered in ED Medications  meclizine (ANTIVERT) tablet 25 mg (not administered)  LORazepam (ATIVAN) injection 1 mg (not administered)     Initial Impression / Assessment and Plan / ED Course  I have reviewed the triage vital signs and the nursing notes.  Pertinent labs & imaging results that were available during my care of the patient were reviewed by me and considered in my medical decision making (see chart for details).     BP (!) 155/84   Pulse 63   Temp 98.2 F (36.8 C) (Oral)   Resp 19   Ht _0  (1.6 m)   Wt 86.2 kg (190 lb)   SpO2 99%   BMI 33.66 kg/m    Final Clinical Impressions(s) / ED Diagnoses   Final diagnoses:  Bad headache  Dizziness    New Prescriptions New Prescriptions   No medications on file   2:44 PM Pt on Xarelto for afib, here with vertigo sxs and headache since yesterday morning.  Her vertigo is brought on with positional changes, but also present at rest.  She's unable to ambulate.  She does have some past pointing affecting her R hand.  Her strength is normal.  She is A&Ox4, and no cranial nerve deficits.  Will obtain head CT scan as well as brain MRI.  Care  discussed with Dr. Gilford Raid.   4:19 PM Pt sign out to oncoming provider.  Pt  has vertigo, currently awaits MRI to r/o central cause.  If negative and sxs improves, pt may be discharge with antivert.  Pt will need reassessment prior to disposition.     Domenic Moras, PA-C 04/08/17 [place], Julie, MD 04/09/17 [phone]

## 2017-04-08 NOTE — ED Triage Notes (Signed)
Patient BIB EMS from home with c/o dizziness when sitting or standing. Patient reports she was diagnosed with vertigo in the past, but does not take any medications for it. Patient reports it started yesterday morning and it has been getting progressively worse. Patient also reports a headache beginning with the vertigo yesterday. Patient also reports abdominal pain "because she hasnt been able to eat anything today." Patient lives with her daughter. Patient does has history of HTN and was hypertensive for EMS. EMS unable to obtain orthostatics due to patient vertigo.

## 2017-04-13 ENCOUNTER — Encounter: Payer: Self-pay | Admitting: Family Medicine

## 2017-04-13 ENCOUNTER — Ambulatory Visit (INDEPENDENT_AMBULATORY_CARE_PROVIDER_SITE_OTHER): Payer: Medicare HMO | Admitting: Family Medicine

## 2017-04-13 VITALS — BP 142/70 | HR 120 | Temp 98.5°F | Resp 17 | Ht 63.0 in | Wt 185.2 lb

## 2017-04-13 DIAGNOSIS — H8113 Benign paroxysmal vertigo, bilateral: Secondary | ICD-10-CM | POA: Diagnosis not present

## 2017-04-13 NOTE — Progress Notes (Signed)
Chief Complaint  Patient presents with  . elevated systolic pressure    better now, seen in ED for dizziness dx with vertigo, pt very dizzy, per daughter may need ENT referral for vertigo    HPI  Pt was seen in the ER for dizziness and headache on 04/08/17 She was evaluated with CT head which was negative.  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  She was discharged home with diagnosis of vertigo which she has a history of  BP Readings from Last 3 Encounters:  04/16/17 130/78  04/13/17 (!) 142/70  04/08/17 (!) 203/96   She stopped taking her bp medication because she thought she it was causing her dizziness    4 review of systems  Past Medical History:  Diagnosis Date  . Anemia   . Bradycardia   . Cataract   . Chronic kidney disease   . Diabetes mellitus type II   . Hypertension   . Osteoporosis   . Syncope   . Weakness     Current Outpatient Medications  Medication Sig Dispense Refill  . cloNIDine (CATAPRES) 0.1 MG tablet Take 1 tablet (0.1 mg total) by mouth at bedtime. 30 tablet 0  . flecainide (TAMBOCOR) 100 MG tablet Take 0.5 tablets (50 mg total) by mouth 2 (two) times daily. 180 tablet 3  . glipiZIDE (GLUCOTROL) 10 MG tablet TAKE 1 TABLET TWICE DAILY BEFORE A MEAL 180 tablet 0  . irbesartan-hydrochlorothiazide (AVALIDE) 300-12.5 MG tablet Take 1 tablet by mouth daily. 90 tablet 0  . meclizine (ANTIVERT) 25 MG tablet Take 1 tablet (25 mg total) by mouth 3 (three) times daily as needed for dizziness. 20 tablet 0  . metFORMIN (GLUCOPHAGE-XR) 500 MG 24 hr tablet TAKE 2 TABLETS EVERY DAY WITH BREAKFAST 180 tablet 1  . polyethylene glycol powder (GLYCOLAX/MIRALAX) powder Take 17 g by mouth 2 (two) times daily as needed. (Patient taking differently: Take 17 g by mouth 2 (two) times daily as needed for moderate constipation. ) 500 g 1  . rivaroxaban (XARELTO) 20 MG TABS tablet Take 1 tablet (20 mg total) by mouth daily with  supper. 90 tablet 3  . simvastatin (ZOCOR) 20 MG tablet Take 1 tablet (20 mg total) by mouth daily at 6 PM. 90 tablet 0  . TRUE METRIX BLOOD GLUCOSE TEST test strip USE TO TEST BLOOD SUGAR ONCE DAILY. 100 each 3  . TRUEPLUS LANCETS 28G MISC USE TO TEST BLOOD SUGAR ONCE DAILY.  100 each 3  . acetaminophen (TYLENOL) 500 MG tablet Take 1 tablet (500 mg total) by mouth every 6 (six) hours as needed. (Patient not taking: Reported on 03/24/2017) 30 tablet 0  . Alcohol Swabs (B-D SINGLE USE SWABS REGULAR) PADS USE TO CHECK BLOOD SUGAR DAILY  100 each 3  . Blood Glucose Calibration (SURECHEK CONTROL SOLUTION) Normal LIQD Use to check blood sugar daily. Dx E11.65 1 each 3  . blood glucose meter kit and supplies Use to test blood sugar once daily. Dx: E11.65 1 each 0  . Blood Glucose Monitoring Suppl (TRUE METRIX AIR GLUCOSE METER) DEVI 1 each by Does not apply route daily. Use to test blood sugar once daily. Dx: E11.65 1 Device 0  . PAZEO 0.7 % SOLN Place 1 drop into both eyes daily.  11   No current facility-administered medications for this visit.     Allergies:  Allergies  Allergen Reactions  . Codeine Other (See Comments)    vertigo  Past Surgical History:  Procedure Laterality Date  . ABDOMINAL HYSTERECTOMY    . CESAREAN SECTION    . EYE SURGERY      Social History   Socioeconomic History  . Marital status: Widowed    Spouse name: None  . Number of children: None  . Years of education: None  . Highest education level: None  Social Needs  . Financial resource strain: None  . Food insecurity - worry: None  . Food insecurity - inability: None  . Transportation needs - medical: None  . Transportation needs - non-medical: None  Occupational History  . None  Tobacco Use  . Smoking status: Never Smoker  . Smokeless tobacco: Never Used  Substance and Sexual Activity  . Alcohol use: No  . Drug use: No  . Sexual activity: None  Other Topics Concern  . None  Social History  Narrative  . None    Family History  Problem Relation Age of Onset  . Diabetes Sister   . Hyperlipidemia Sister   . Hypertension Sister   . Diabetes Brother   . Hyperlipidemia Brother   . Hypertension Brother      ROS Review of Systems See HPI  Objective: Vitals:   04/13/17 1409  BP: (!) 142/70  Pulse: (!) 120  Resp: 17  Temp: 98.5 F (36.9 C)  TempSrc: Oral  SpO2: 95%  Weight: 185 lb 3.2 oz (84 kg)  Height: '5\' 3"'$  (1.6 m)    Physical Exam  Constitutional: She is oriented to person, place, and time. She appears well-developed and well-nourished.  HENT:  Head: Normocephalic and atraumatic.  Cardiovascular: Normal rate, regular rhythm and normal heart sounds.  Pulmonary/Chest: Breath sounds normal. No respiratory distress.  Neurological: She is alert and oriented to person, place, and time.   CRANIAL NERVES: CN II: Visual fields are full to confrontation. Fundoscopic exam is normal with sharp discs and no vascular changes. Pupils are round equal and briskly reactive to light. CN III, IV, VI: extraocular movement are normal. No ptosis. CN V: Facial sensation is intact to pinprick in all 3 divisions bilaterally. Corneal responses are intact.  CN VII: Face is symmetric with normal eye closure and smile. CN VIII: Hearing is normal to rubbing fingers CN IX, X: Palate elevates symmetrically. Phonation is normal. CN XI: Head turning and shoulder shrug are intact CN XII: Tongue is midline with normal movements and no atrophy.  Assessment and Plan Sean was seen today for elevated systolic pressure.  Diagnoses and all orders for this visit:  Benign paroxysmal positional vertigo due to bilateral vestibular disorder -     Cancel: Ambulatory referral to Physical Therapy -     Ambulatory referral to Physical Therapy -     meclizine (ANTIVERT) 25 MG tablet; Take 1 tablet (25 mg total) by mouth 3 (three) times daily as needed for dizziness.  Fall precautions  reviewed Pt advised not to drive Referral to vestibular rehab Follow up after PT ER precautions reviewed   [place]

## 2017-04-13 NOTE — Patient Instructions (Addendum)
1. Continue clonidine  2. Take meclizine for dizziness 3. Follow up with [facility] at [facility] 04/14/17 at 1pm 4. Follow up with Dr. Brigitte Pulse in 3-4 days for blood pressure management     IF you received an x-ray today, you will receive an invoice from [facility] Rad[facility]. Please contact [facility] Radiology at [phone] with questions or concerns regarding your invoice.   IF you received labwork today, you will receive an invoice from [place]. Please contact [facility] at [phone] with questions or concerns regarding your invoice.   Our billing staff will not be able to assist you with questions regarding bills from these companies.  You will be contacted with the lab results as soon as they are available. The fastest way to get your results is to activate your My Chart account. Instructions are located on the last page of this paperwork. If you have not heard from [place] regarding the results in 2 weeks, please contact this office.    How to Perform the Epley Maneuver The Epley maneuver is an exercise that relieves symptoms of vertigo. Vertigo is the feeling that you or your surroundings are moving when they are not. When you feel vertigo, you may feel like the room is spinning and have trouble walking. Dizziness is a little different than vertigo. When you are dizzy, you may feel unsteady or light-headed. You can do this maneuver at home whenever you have symptoms of vertigo. You can do it up to 3 times a day until your symptoms go away. Even though the Epley maneuver may relieve your vertigo for a few weeks, it is possible that your symptoms will return. This maneuver relieves vertigo, but it does not relieve dizziness. What are the risks? If it is done correctly, the Epley maneuver is considered safe. Sometimes it can lead to dizziness or nausea that goes away after a short time. If you develop other symptoms, such as changes in vision, weakness, or numbness, stop  doing the maneuver and call your health care provider. How to perform the Epley maneuver 1. Sit on the edge of a bed or table with your back straight and your legs extended or hanging over the edge of the bed or table. 2. Turn your head halfway toward the affected ear or side. 3. Lie backward quickly with your head turned until you are lying flat on your back. You may want to position a pillow under your shoulders. 4. Hold this position for 30 seconds. You may experience an attack of vertigo. This is normal. 5. Turn your head to the opposite direction until your unaffected ear is facing the floor. 6. Hold this position for 30 seconds. You may experience an attack of vertigo. This is normal. Hold this position until the vertigo stops. 7. Turn your whole body to the same side as your head. Hold for another 30 seconds. 8. Sit back up. You can repeat this exercise up to 3 times a day. Follow these instructions at home:  After doing the Epley maneuver, you can return to your normal activities.  Ask your health care provider if there is anything you should do at home to prevent vertigo. He or she may recommend that you: ? Keep your head raised (elevated) with two or more pillows while you sleep. ? Do not sleep on the side of your affected ear. ? Get up slowly from bed. ? Avoid sudden movements during the day. ? Avoid extreme head movement, like looking up or bending over.  Contact a health care provider if:  Your vertigo gets worse.  You have other symptoms, including: ? Nausea. ? Vomiting. ? Headache. Get help right away if:  You have vision changes.  You have a severe or worsening headache or neck pain.  You cannot stop vomiting.  You have new numbness or weakness in any part of your body. Summary  Vertigo is the feeling that you or your surroundings are moving when they are not.  The Epley maneuver is an exercise that relieves symptoms of vertigo.  If the Epley maneuver is  done correctly, it is considered safe. You can do it up to 3 times a day. This information is not intended to replace advice given to you by your health care provider. Make sure you discuss any questions you have with your health care provider. Document Released: 06/12/2013 Document Revised: 04/27/2016 Document Reviewed: 04/27/2016 Elsevier Interactive Patient Education  2017 [employer].

## 2017-04-14 ENCOUNTER — Other Ambulatory Visit: Payer: Self-pay | Admitting: Family Medicine

## 2017-04-14 DIAGNOSIS — R42 Dizziness and giddiness: Secondary | ICD-10-CM | POA: Diagnosis not present

## 2017-04-14 DIAGNOSIS — H8111 Benign paroxysmal vertigo, right ear: Secondary | ICD-10-CM | POA: Diagnosis not present

## 2017-04-14 DIAGNOSIS — R262 Difficulty in walking, not elsewhere classified: Secondary | ICD-10-CM | POA: Diagnosis not present

## 2017-04-14 DIAGNOSIS — R2681 Unsteadiness on feet: Secondary | ICD-10-CM | POA: Diagnosis not present

## 2017-04-14 MED ORDER — MECLIZINE HCL 25 MG PO TABS: 25.0000 mg | ORAL_TABLET | Freq: Three times a day (TID) | ORAL | 0 refills | Status: DC | PRN

## 2017-04-14 NOTE — Telephone Encounter (Signed)
Rx for meclizine 25 mg #20 with no refills sent via fax to [facility] @ [phone]. Pt notified an appreciative. Dgaddy, CMA

## 2017-04-16 ENCOUNTER — Encounter: Payer: Self-pay | Admitting: Family Medicine

## 2017-04-16 ENCOUNTER — Ambulatory Visit (INDEPENDENT_AMBULATORY_CARE_PROVIDER_SITE_OTHER): Payer: Medicare HMO | Admitting: Family Medicine

## 2017-04-16 VITALS — BP 130/78 | HR 56 | Temp 98.4°F

## 2017-04-16 DIAGNOSIS — I10 Essential (primary) hypertension: Secondary | ICD-10-CM

## 2017-04-16 DIAGNOSIS — R42 Dizziness and giddiness: Secondary | ICD-10-CM

## 2017-04-16 DIAGNOSIS — H8113 Benign paroxysmal vertigo, bilateral: Secondary | ICD-10-CM

## 2017-04-16 NOTE — Progress Notes (Signed)
10/27/20181:21 PM  Catherine Joseph 23-Jul-1940, 76 y.o. female [id number]  Chief Complaint  Patient presents with  . Follow-up    elevated blood pressure with dizziness and headaches    HPI:   Patient is a 76 y.o. female who presents today for fu on BP and dizziness.  Seen on 10/19th, dx with vertigo. BP very elevated then.EKG SNR. Labs unremarkable. Head CT and MRI with no acute findings. Referred to PT and rx meclizine.  Then seen on 10/24th, BP better, continue plan of care.  Today patient tells me that she is still feeling dizzy, room spinning when she goes from lying to sitting or standing. BP at home in the mornings ~ 160s. Has done 1 session of PT and uses meclizine prn which helps some. Had similar episode about 17 years ago  Last saw cards in 07/2016, ok with HR high 50-low 60s, cont flecainide.   Patient of Dr Brigitte Pulse, next appt in Nov  Depression screen [facility] 2/9 04/13/2017 03/24/2017 02/24/2017  Decreased Interest 0 0 0  Down, Depressed, Hopeless 0 0 0  PHQ - 2 Score 0 0 0    Allergies  Allergen Reactions  . Codeine Other (See Comments)    vertigo    Prior to Admission medications   Medication Sig Start Date End Date Taking? Authorizing Provider  Alcohol Swabs (B-D SINGLE USE SWABS REGULAR) PADS USE TO CHECK BLOOD SUGAR DAILY  08/06/16  Yes Shawnee Knapp, MD  Blood Glucose Calibration (SURECHEK CONTROL SOLUTION) Normal LIQD Use to check blood sugar daily. Dx E11.65 09/08/15  Yes Shawnee Knapp, MD  blood glucose meter kit and supplies Use to test blood sugar once daily. Dx: E11.65 07/09/15  Yes Copland, Gay Filler, MD  Blood Glucose Monitoring Suppl (TRUE METRIX AIR GLUCOSE METER) DEVI 1 each by Does not apply route daily. Use to test blood sugar once daily. Dx: E11.65 09/09/15  Yes Ivar Drape D, PA  cloNIDine (CATAPRES) 0.1 MG tablet Take 1 tablet (0.1 mg total) by mouth at bedtime. 03/24/17  Yes Shawnee Knapp, MD  flecainide (TAMBOCOR) 100 MG tablet Take 0.5 tablets  (50 mg total) by mouth 2 (two) times daily. 07/22/16  Yes Josue Hector, MD  glipiZIDE (GLUCOTROL) 10 MG tablet TAKE 1 TABLET TWICE DAILY BEFORE A MEAL 03/24/17  Yes Shawnee Knapp, MD  irbesartan-hydrochlorothiazide (AVALIDE) 300-12.5 MG tablet Take 1 tablet by mouth daily. 03/24/17  Yes Shawnee Knapp, MD  meclizine (ANTIVERT) 25 MG tablet Take 1 tablet (25 mg total) by mouth 3 (three) times daily as needed for dizziness. 04/14/17  Yes Stallings, Zoe A, MD  metFORMIN (GLUCOPHAGE-XR) 500 MG 24 hr tablet TAKE 2 TABLETS EVERY DAY WITH BREAKFAST 02/14/17  Yes Shawnee Knapp, MD  PAZEO 0.7 % SOLN Place 1 drop into both eyes daily. 05/24/16  Yes [provider]  polyethylene glycol powder (GLYCOLAX/MIRALAX) powder Take 17 g by mouth 2 (two) times daily as needed. Patient taking differently: Take 17 g by mouth 2 (two) times daily as needed for moderate constipation.  11/19/16  Yes Shawnee Knapp, MD  rivaroxaban (XARELTO) 20 MG TABS tablet Take 1 tablet (20 mg total) by mouth daily with supper. 07/22/16  Yes Josue Hector, MD  simvastatin (ZOCOR) 20 MG tablet Take 1 tablet (20 mg total) by mouth daily at 6 PM. 03/24/17  Yes Shawnee Knapp, MD  TRUE METRIX BLOOD GLUCOSE TEST test strip USE TO TEST BLOOD SUGAR ONCE DAILY.  08/06/16  Yes Shawnee Knapp, MD  TRUEPLUS LANCETS 28G MISC USE TO TEST BLOOD SUGAR ONCE DAILY.  08/06/16  Yes Shawnee Knapp, MD  acetaminophen (TYLENOL) 500 MG tablet Take 1 tablet (500 mg total) by mouth every 6 (six) hours as needed. Patient not taking: Reported on 03/24/2017 11/19/16   Shawnee Knapp, MD    Past Medical History:  Diagnosis Date  . Anemia   . Bradycardia   . Cataract   . Chronic kidney disease   . Diabetes mellitus type II   . Hypertension   . Osteoporosis   . Syncope   . Weakness     Past Surgical History:  Procedure Laterality Date  . ABDOMINAL HYSTERECTOMY    . CESAREAN SECTION    . EYE SURGERY      Social History  Substance Use Topics  . Smoking status: Never Smoker  .  Smokeless tobacco: Never Used  . Alcohol use No    Family History  Problem Relation Age of Onset  . Diabetes Sister   . Hyperlipidemia Sister   . Hypertension Sister   . Diabetes Brother   . Hyperlipidemia Brother   . Hypertension Brother     Review of Systems  Constitutional: Negative for chills and fever.  Respiratory: Negative for cough and shortness of breath.   Cardiovascular: Negative for chest pain, palpitations and leg swelling.  Gastrointestinal: Positive for nausea. Negative for abdominal pain and vomiting.  Neurological: Positive for dizziness.     OBJECTIVE:  Blood pressure 130/78, pulse (!) 56, temperature 98.4 F (36.9 C), temperature source Oral, SpO2 99 %.  Orthostatic VS for the past 24 hrs (Last 3 readings):  BP- Lying Pulse- Lying BP- Sitting Pulse- Sitting BP- Standing at 0 minutes Pulse- Standing at 0 minutes  04/16/17 1131 149/79 60 154/82 64 149/77 66    Physical Exam  Constitutional: She is oriented to person, place, and time and well-developed, well-nourished, and in no distress.  HENT:  Head: Normocephalic and atraumatic.  Mouth/Throat: Oropharynx is clear and moist. No oropharyngeal exudate.  Eyes: Pupils are equal, round, and reactive to light. EOM are normal. No scleral icterus.  Neck: Neck supple.  Cardiovascular: Normal rate, regular rhythm and normal heart sounds.  Exam reveals no gallop and no friction rub.   No murmur heard. Pulmonary/Chest: Effort normal and breath sounds normal. She has no wheezes. She has no rales.  Musculoskeletal: She exhibits no edema.  Neurological: She is alert and oriented to person, place, and time. Gait normal.  Skin: Skin is warm and dry.     ASSESSMENT and PLAN  1. Benign paroxysmal positional vertigo due to bilateral vestibular disorder Discussed diagnosis, treatment and natural history. Continue with current treatment plan.  2. Essential hypertension At goal in clinic today, no signs of orthostatic  hypotension.   3. Dizziness and giddiness - Orthostatic vital signs  Return for as scheduled with PCP in Nov.    Rutherford Guys, MD Primary Care at [place], [place] [address] Ph.  [phone] Fax [phone]

## 2017-04-16 NOTE — Patient Instructions (Addendum)
1. Continue precautions for fall prevention 2. Continue with physical therapy 3. Use meclizine as needed for dizziness 3. Keep upcoming appt with your PCP, Dr Brigitte Pulse    IF you received an x-ray today, you will receive an invoice from [facility] Radiology. Please contact [facility] Radiology at [phone] with questions or concerns regarding your invoice.   IF you received labwork today, you will receive an invoice from [place] [facility]. Please contact [facility] at [phone] with questions or concerns regarding your invoice.   Our billing staff will not be able to assist you with questions regarding bills from these companies.  You will be contacted with the lab results as soon as they are available. The fastest way to get your results is to activate your My Chart account. Instructions are located on the last page of this paperwork. If you have not heard from [place] regarding the results in 2 weeks, please contact this office.

## 2017-04-18 DIAGNOSIS — R42 Dizziness and giddiness: Secondary | ICD-10-CM | POA: Diagnosis not present

## 2017-04-18 DIAGNOSIS — H8111 Benign paroxysmal vertigo, right ear: Secondary | ICD-10-CM | POA: Diagnosis not present

## 2017-04-18 DIAGNOSIS — R262 Difficulty in walking, not elsewhere classified: Secondary | ICD-10-CM | POA: Diagnosis not present

## 2017-04-18 DIAGNOSIS — R2681 Unsteadiness on feet: Secondary | ICD-10-CM | POA: Diagnosis not present

## 2017-04-20 DIAGNOSIS — R2681 Unsteadiness on feet: Secondary | ICD-10-CM | POA: Diagnosis not present

## 2017-04-20 DIAGNOSIS — R262 Difficulty in walking, not elsewhere classified: Secondary | ICD-10-CM | POA: Diagnosis not present

## 2017-04-20 DIAGNOSIS — H8111 Benign paroxysmal vertigo, right ear: Secondary | ICD-10-CM | POA: Diagnosis not present

## 2017-04-20 DIAGNOSIS — R42 Dizziness and giddiness: Secondary | ICD-10-CM | POA: Diagnosis not present

## 2017-04-26 DIAGNOSIS — R42 Dizziness and giddiness: Secondary | ICD-10-CM | POA: Diagnosis not present

## 2017-04-26 DIAGNOSIS — R262 Difficulty in walking, not elsewhere classified: Secondary | ICD-10-CM | POA: Diagnosis not present

## 2017-04-26 DIAGNOSIS — H8111 Benign paroxysmal vertigo, right ear: Secondary | ICD-10-CM | POA: Diagnosis not present

## 2017-04-26 DIAGNOSIS — R2681 Unsteadiness on feet: Secondary | ICD-10-CM | POA: Diagnosis not present

## 2017-05-02 ENCOUNTER — Telehealth: Payer: Self-pay | Admitting: Family Medicine

## 2017-05-02 NOTE — Telephone Encounter (Signed)
Pt has a urgent referral in for physical therapy since 10/24 and the notes are incomplete.. Need the notes completed before the referral can be sent out.Marland Kitchen  Please advise thanks.Marland Kitchen

## 2017-05-05 ENCOUNTER — Encounter: Payer: Self-pay | Admitting: Family Medicine

## 2017-05-05 ENCOUNTER — Other Ambulatory Visit: Payer: Self-pay

## 2017-05-05 ENCOUNTER — Ambulatory Visit: Payer: Medicare HMO | Admitting: Family Medicine

## 2017-05-05 VITALS — BP 128/68 | HR 62 | Temp 98.7°F | Resp 16 | Ht 63.0 in | Wt 189.6 lb

## 2017-05-05 DIAGNOSIS — Z87440 Personal history of urinary (tract) infections: Secondary | ICD-10-CM

## 2017-05-05 DIAGNOSIS — R109 Unspecified abdominal pain: Secondary | ICD-10-CM | POA: Diagnosis not present

## 2017-05-05 DIAGNOSIS — R42 Dizziness and giddiness: Secondary | ICD-10-CM | POA: Diagnosis not present

## 2017-05-05 DIAGNOSIS — R3129 Other microscopic hematuria: Secondary | ICD-10-CM | POA: Diagnosis not present

## 2017-05-05 DIAGNOSIS — Z8639 Personal history of other endocrine, nutritional and metabolic disease: Secondary | ICD-10-CM | POA: Diagnosis not present

## 2017-05-05 DIAGNOSIS — E119 Type 2 diabetes mellitus without complications: Secondary | ICD-10-CM

## 2017-05-05 DIAGNOSIS — R5381 Other malaise: Secondary | ICD-10-CM | POA: Diagnosis not present

## 2017-05-05 DIAGNOSIS — H8111 Benign paroxysmal vertigo, right ear: Secondary | ICD-10-CM

## 2017-05-05 LAB — POCT URINALYSIS DIP (MANUAL ENTRY)
Bilirubin, UA: NEGATIVE
Glucose, UA: NEGATIVE mg/dL
Ketones, POC UA: NEGATIVE mg/dL
Leukocytes, UA: NEGATIVE
Nitrite, UA: NEGATIVE
Protein Ur, POC: NEGATIVE mg/dL
Spec Grav, UA: 1.015 (ref 1.010–1.025)
Urobilinogen, UA: 0.2 E.U./dL
pH, UA: 7 (ref 5.0–8.0)

## 2017-05-05 LAB — POC MICROSCOPIC URINALYSIS (UMFC): Mucus: ABSENT

## 2017-05-05 LAB — POCT GLYCOSYLATED HEMOGLOBIN (HGB A1C): Hemoglobin A1C: 6.6

## 2017-05-05 MED ORDER — CYANOCOBALAMIN 1000 MCG/ML IJ SOLN
1000.0000 ug | Freq: Once | INTRAMUSCULAR | Status: AC
Start: 2017-05-05 — End: 2017-05-05
  Administered 2017-05-05: 1000 ug via INTRAMUSCULAR

## 2017-05-05 MED ORDER — DIAZEPAM 2 MG PO TABS: 2.0000 mg | ORAL_TABLET | Freq: Four times a day (QID) | ORAL | 0 refills | Status: DC | PRN

## 2017-05-05 MED ORDER — MECLIZINE HCL 25 MG PO TABS: 25.0000 mg | ORAL_TABLET | Freq: Three times a day (TID) | ORAL | 0 refills | Status: DC | PRN

## 2017-05-05 MED ORDER — CLOTRIMAZOLE 2 % VA CREA: 1.0000 | TOPICAL_CREAM | Freq: Every day | VAGINAL | 3 refills | Status: DC

## 2017-05-05 NOTE — Patient Instructions (Addendum)
You can talk to Idelia Salm our billing specialists to see if there is anything we could help do to mitigate the CT scan bill with your insurance.  Take the meclizine 3 times a day. If you are still having breakthrough dizziness, you can take a diazepam in addition. These may make you sleepy.   I have referred you to [facility]. They will call you but if you would like, it may be faster for you to call them to schedule your appointment.  [facility] Address: [place], [place],  [address] Phone: [phone]   IF you received an x-ray today, you will receive an invoice from [facility]. Please contact [facility] at [phone] with questions or concerns regarding your invoice.   IF you received labwork today, you will receive an invoice from [facility]. Please contact [facility] at [phone] with questions or concerns regarding your invoice.   Our billing staff will not be able to assist you with questions regarding bills from these companies.  You will be contacted with the lab results as soon as they are available. The fastest way to get your results is to activate your My Chart account. Instructions are located on the last page of this paperwork. If you have not heard from [place] regarding the results in 2 weeks, please contact this office.     Benign Positional Vertigo Vertigo is the feeling that you or your surroundings are moving when they are not. Benign positional vertigo is the most common form of vertigo. The cause of this condition is not serious (is benign). This condition is triggered by certain movements and positions (is positional). This condition can be dangerous if it occurs while you are doing something that could endanger you or others, such as driving. What are the causes? In many cases, the cause of this condition is not known. It may be caused by a disturbance in an area of the inner ear that helps your brain to sense  movement and balance. This disturbance can be caused by a viral infection (labyrinthitis), head injury, or repetitive motion. What increases the risk? This condition is more likely to develop in:  Women.  People who are 47 years of age or older.  What are the signs or symptoms? Symptoms of this condition usually happen when you move your head or your eyes in different directions. Symptoms may start suddenly, and they usually last for less than a minute. Symptoms may include:  Loss of balance and falling.  Feeling like you are spinning or moving.  Feeling like your surroundings are spinning or moving.  Nausea and vomiting.  Blurred vision.  Dizziness.  Involuntary eye movement (nystagmus).  Symptoms can be mild and cause only slight annoyance, or they can be severe and interfere with daily life. Episodes of benign positional vertigo may return (recur) over time, and they may be triggered by certain movements. Symptoms may improve over time. How is this diagnosed? This condition is usually diagnosed by medical history and a physical exam of the head, neck, and ears. You may be referred to a health care provider who specializes in ear, nose, and throat (ENT) problems (otolaryngologist) or a provider who specializes in disorders of the nervous system (neurologist). You may have additional testing, including:  MRI.  A CT scan.  Eye movement tests. Your health care provider may ask you to change positions quickly while he or she watches you for symptoms of benign positional vertigo, such as  nystagmus. Eye movement may be tested with an electronystagmogram (ENG), caloric stimulation, the Dix-Hallpike test, or the roll test.  An electroencephalogram (EEG). This records electrical activity in your brain.  Hearing tests.  How is this treated? Usually, your health care provider will treat this by moving your head in specific positions to adjust your inner ear back to normal. Surgery may  be needed in severe cases, but this is rare. In some cases, benign positional vertigo may resolve on its own in 2-4 weeks. Follow these instructions at home: Safety  Move slowly.Avoid sudden body or head movements.  Avoid driving.  Avoid operating heavy machinery.  Avoid doing any tasks that would be dangerous to you or others if a vertigo episode would occur.  If you have trouble walking or keeping your balance, try using a cane for stability. If you feel dizzy or unstable, sit down right away.  Return to your normal activities as told by your health care provider. Ask your health care provider what activities are safe for you. General instructions  Take over-the-counter and prescription medicines only as told by your health care provider.  Avoid certain positions or movements as told by your health care provider.  Drink enough fluid to keep your urine clear or pale yellow.  Keep all follow-up visits as told by your health care provider. This is important. Contact a health care provider if:  You have a fever.  Your condition gets worse or you develop new symptoms.  Your family or friends notice any behavioral changes.  Your nausea or vomiting gets worse.  You have numbness or a "pins and needles" sensation. Get help right away if:  You have difficulty speaking or moving.  You are always dizzy.  You faint.  You develop severe headaches.  You have weakness in your legs or arms.  You have changes in your hearing or vision.  You develop a stiff neck.  You develop sensitivity to light. This information is not intended to replace advice given to you by your health care provider. Make sure you discuss any questions you have with your health care provider. Document Released: 03/15/2006 Document Revised: 11/13/2015 Document Reviewed: 09/30/2014 Elsevier Interactive Patient Education  2018 [employer].   How to Perform the Epley Maneuver The Epley maneuver is an  exercise that relieves symptoms of vertigo. Vertigo is the feeling that you or your surroundings are moving when they are not. When you feel vertigo, you may feel like the room is spinning and have trouble walking. Dizziness is a little different than vertigo. When you are dizzy, you may feel unsteady or light-headed. You can do this maneuver at home whenever you have symptoms of vertigo. You can do it up to 3 times a day until your symptoms go away. Even though the Epley maneuver may relieve your vertigo for a few weeks, it is possible that your symptoms will return. This maneuver relieves vertigo, but it does not relieve dizziness. What are the risks? If it is done correctly, the Epley maneuver is considered safe. Sometimes it can lead to dizziness or nausea that goes away after a short time. If you develop other symptoms, such as changes in vision, weakness, or numbness, stop doing the maneuver and call your health care provider. How to perform the Epley maneuver 1. Sit on the edge of a bed or table with your back straight and your legs extended or hanging over the edge of the bed or table. 2. Turn your head  halfway toward the affected ear or side. 3. Lie backward quickly with your head turned until you are lying flat on your back. You may want to position a pillow under your shoulders. 4. Hold this position for 30 seconds. You may experience an attack of vertigo. This is normal. 5. Turn your head to the opposite direction until your unaffected ear is facing the floor. 6. Hold this position for 30 seconds. You may experience an attack of vertigo. This is normal. Hold this position until the vertigo stops. 7. Turn your whole body to the same side as your head. Hold for another 30 seconds. 8. Sit back up. You can repeat this exercise up to 3 times a day. Follow these instructions at home:  After doing the Epley maneuver, you can return to your normal activities.  Ask your health care provider if  there is anything you should do at home to prevent vertigo. He or she may recommend that you: ? Keep your head raised (elevated) with two or more pillows while you sleep. ? Do not sleep on the side of your affected ear. ? Get up slowly from bed. ? Avoid sudden movements during the day. ? Avoid extreme head movement, like looking up or bending over. Contact a health care provider if:  Your vertigo gets worse.  You have other symptoms, including: ? Nausea. ? Vomiting. ? Headache. Get help right away if:  You have vision changes.  You have a severe or worsening headache or neck pain.  You cannot stop vomiting.  You have new numbness or weakness in any part of your body. Summary  Vertigo is the feeling that you or your surroundings are moving when they are not.  The Epley maneuver is an exercise that relieves symptoms of vertigo.  If the Epley maneuver is done correctly, it is considered safe. You can do it up to 3 times a day. This information is not intended to replace advice given to you by your health care provider. Make sure you discuss any questions you have with your health care provider. Document Released: 06/12/2013 Document Revised: 04/27/2016 Document Reviewed: 04/27/2016 Elsevier Interactive Patient Education  2017 [employer].

## 2017-05-05 NOTE — Progress Notes (Addendum)
Subjective:    Patient ID: Catherine Joseph, female    DOB: 04-04-1941, 76 y.o.   MRN: [id number] Chief Complaint  Patient presents with  . Follow-up    vertigo , TIIDM   Hypertension   Has been doing PT for dizziness.  Going to [facility] for 1 wk.  Vertigo was severe - had to call EMS.  Taking meclizine tid.  No ear pain, pressure, no tinnitus, no change in hearing.  Was initially left-sided, now right-sided for the past wk.   Dizziness occurs when she turns onto her Rt side and lasts for about 5 minutes.  Worse when she closes her eyes.   Doing the home Epley maneuvers 1x/d. Has not had any falls yet.    Pt is s/p hysterectomy. Still has her cervix.   Consitaption has resolved, now goes every day but takes smooth move tea every day.  But still has righ flank pain. Miralax didn't work.   No further presyncopal episodes.  No HA. No vision changes. But head feels heavy since sh ehas developed the vertigo. Checking BP at home - 140s/70s. Feels very weak. Normal taste/tongue but does have some dry mouth.  DMII: Diagnosed .   Lab Results  Component Value Date   HGBA1C 6.4 02/14/2017   HGBA1C 6.8 09/30/2016   HGBA1C 6.6 07/03/2016   CBGs: fasting a.m. cbgs 90-120 but good. ; after meal  ; No hypoglycemic episodes. Does have tremors when it is 82.   Meter type:  Diet:  Exercising:  DM Med Regimen: Prior changes:   eGFR:  Baseline Cr:  Last checked . Microalb: Done 09/30/2016. Normal. On acei Lipids:  LDL ,  non-HDL .  Last levels done  - were improving from prior. On statin. Taking asa 81 qd.  Optho: Seen annually by - last exam  Feet: Monofilament exam done . Denies any no problems.  Not seen by podiatry prior.  Immunizations:  Influenza:  Pneumovax-23:   1. Type 2 diabetes mellitus without complication, without long-term current use of insulin (HCC) - been off metformin for several wks w/ no improvement in GI sxs so will restart. Cont glipizide though may need to  decrease dose as a1c at goal - cut in half if any hypoglycemic episodes  2. Essential hypertension - has been out of the irbesartan-hctz for several days - resume, repeat bmp at f/u  3. Mixed hyperlipidemia   4. Paroxysmal atrial fibrillation (HCC)   5. Medication monitoring encounter   6. 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 . .  Ddx also includes renal abnml or partial sbo from adhesions from prior infexn.  Needs CT to eval ASAP. HOC neg in office x 1 - check home cards as well to confirm.  7. Urinary frequency   8. Urinary urgency   9. Right flank pain - UA grossly abnml though no concern for infxn - needs eval of renal to exclude cyst, tumor, or other physical kidney abnml causing pain  10. History of abdominal abscess    Need to r/o ovraian abnml.  Past Medical History:  Diagnosis Date  .  Anemia   . Bradycardia   . Cataract   . Chronic kidney disease   . Diabetes mellitus type II   . Hypertension   . Osteoporosis   . Syncope   . Weakness    Past Surgical History:  Procedure Laterality Date  . ABDOMINAL HYSTERECTOMY    . CESAREAN SECTION    . EYE SURGERY     Current Outpatient Medications on File Prior to Visit  Medication Sig Dispense Refill  . acetaminophen (TYLENOL) 500 MG tablet Take 1 tablet (500 mg total) by mouth every 6 (six) hours as needed. (Patient not taking: Reported on 03/24/2017) 30 tablet 0  . Alcohol Swabs (B-D SINGLE USE SWABS REGULAR) PADS USE TO CHECK BLOOD SUGAR DAILY  100 each 3  . Blood Glucose Calibration (SURECHEK CONTROL SOLUTION) Normal LIQD Use to check blood sugar daily. Dx E11.65 1 each 3  .  blood glucose meter kit and supplies Use to test blood sugar once daily. Dx: E11.65 1 each 0  . Blood Glucose Monitoring Suppl (TRUE METRIX AIR GLUCOSE METER) DEVI 1 each by Does not apply route daily. Use to test blood sugar once daily. Dx: E11.65 1 Device 0  . cloNIDine (CATAPRES) 0.1 MG tablet Take 1 tablet (0.1 mg total) by mouth at bedtime. 30 tablet 0  . flecainide (TAMBOCOR) 100 MG tablet Take 0.5 tablets (50 mg total) by mouth 2 (two) times daily. 180 tablet 3  . glipiZIDE (GLUCOTROL) 10 MG tablet TAKE 1 TABLET TWICE DAILY BEFORE A MEAL 180 tablet 0  . irbesartan-hydrochlorothiazide (AVALIDE) 300-12.5 MG tablet Take 1 tablet by mouth daily. 90 tablet 0  . meclizine (ANTIVERT) 25 MG tablet Take 1 tablet (25 mg total) by mouth 3 (three) times daily as needed for dizziness. 20 tablet 0  . metFORMIN (GLUCOPHAGE-XR) 500 MG 24 hr tablet TAKE 2 TABLETS EVERY DAY WITH BREAKFAST 180 tablet 1  . PAZEO 0.7 % SOLN Place 1 drop into both eyes daily.  11  . polyethylene glycol powder (GLYCOLAX/MIRALAX) powder Take 17 g by mouth 2 (two) times daily as needed. (Patient taking differently: Take 17 g by mouth 2 (two) times daily as needed for moderate constipation. ) 500 g 1  . rivaroxaban (XARELTO) 20 MG TABS tablet Take 1 tablet (20 mg total) by mouth daily with supper. 90 tablet 3  . simvastatin (ZOCOR) 20 MG tablet Take 1 tablet (20 mg total) by mouth daily at 6 PM. 90 tablet 0  . TRUE METRIX BLOOD GLUCOSE TEST test strip USE TO TEST BLOOD SUGAR ONCE DAILY. 100 each 3  . TRUEPLUS LANCETS 28G MISC USE TO TEST BLOOD SUGAR ONCE DAILY.  100 each 3   No current facility-administered medications on file prior to visit.    Allergies  Allergen Reactions  . Codeine Other (See Comments)    vertigo   Family History  Problem Relation Age of Onset  . Diabetes Sister   . Hyperlipidemia Sister   . Hypertension Sister   . Diabetes Brother   . Hyperlipidemia Brother   . Hypertension Brother    Social History    Socioeconomic History  . Marital status: Widowed    Spouse name: None  . Number of children: None  . Years of education: None  . Highest education level: None  Social Needs  . Financial resource strain: None  . Food insecurity - worry: None  . Food insecurity - inability: None  . Transportation needs - medical: None  . Transportation needs -  non-medical: None  Occupational History  . None  Tobacco Use  . Smoking status: Never Smoker  . Smokeless tobacco: Never Used  Substance and Sexual Activity  . Alcohol use: No  . Drug use: No  . Sexual activity: None  Other Topics Concern  . None  Social History Narrative  . None   Depression screen [facility] 2/9 04/13/2017 03/24/2017 02/24/2017 02/14/2017 11/19/2016  Decreased Interest 0 0 0 0 0  Down, Depressed, Hopeless 0 0 0 0 0  PHQ - 2 Score 0 0 0 0 0    Review of Systems  Constitutional: Positive for activity change and appetite change.  Gastrointestinal: Positive for abdominal distention, abdominal pain, constipation and nausea. Negative for anal bleeding, blood in stool, diarrhea and vomiting.  Hematological: Negative for adenopathy.  Psychiatric/Behavioral: Positive for sleep disturbance.   See hpi    Objective:   Physical Exam  Constitutional: She is oriented to person, place, and time. She appears well-developed and well-nourished. No distress.  HENT:  Head: Normocephalic and atraumatic.  Neck: Normal range of motion. Neck supple. No thyromegaly present.  Cardiovascular: Normal rate, regular rhythm, normal heart sounds and intact distal pulses.  Pulmonary/Chest: Effort normal and breath sounds normal. No respiratory distress.  Abdominal: She exhibits distension. She exhibits no mass. Bowel sounds are increased. There is no hepatosplenomegaly. There is tenderness in the suprapubic area and left lower quadrant. There is no rigidity, no rebound, no guarding, no CVA tenderness, no tenderness at McBurney's point and negative  Murphy's sign.  Musculoskeletal: She exhibits no edema.  Lymphadenopathy:    She has no cervical adenopathy.  Neurological: She is alert and oriented to person, place, and time.  Skin: Skin is warm and dry. She is not diaphoretic. No erythema.  Psychiatric: She has a normal mood and affect. Her behavior is normal.     Pulse 62   Temp 98.7 F (37.1 C)   Resp 16   Ht '5\' 3"'$  (1.6 m)   Wt 189 lb 9.6 oz (86 kg)   SpO2 97%   BMI 33.59 kg/m      Assessment & Plan:  Needs flp  1. Type 2 diabetes mellitus without complication, without long-term current use of insulin ([place])   2. Dizzy spells   3. History of non anemic vitamin B12 deficiency   4. History of UTI   5. Benign paroxysmal positional vertigo of right ear      Orders Placed This Encounter  Procedures  . Urine Culture  . TSH  . CBC with Differential/Platelet  . Basic metabolic panel    Order Specific Question:   Has the patient fasted?    Answer:   No  . Vitamin B12  . Ambulatory referral to ENT    Referral Priority:   Urgent    Referral Type:   Consultation    Referral Reason:   Specialty Services Required    Requested Specialty:   Otolaryngology    Number of Visits Requested:   1  . POCT glycosylated hemoglobin (Hb A1C)  . POCT urinalysis dipstick  . POCT Microscopic Urinalysis ([facility])    Meds ordered this encounter  Medications  . cyanocobalamin ((VITAMIN B-12)) injection 1,000 mcg  . meclizine (ANTIVERT) 25 MG tablet    Sig: Take 1 tablet (25 mg total) 3 (three) times daily as needed by mouth for dizziness.    Dispense:  90 tablet    Refill:  0  . diazepam (VALIUM) 2 MG tablet  Sig: Take 1 tablet (2 mg total) every 6 (six) hours as needed by mouth (vertigo).    Dispense:  30 tablet    Refill:  0  . clotrimazole (GYNE-LOTRIMIN 3) 2 % vaginal cream    Sig: Place 1 Applicatorful at bedtime vaginally.    Dispense:  21 g    Refill:  3    Delman Cheadle, M.D.  Primary Care at [facility] [address] [place], [place] [address] [phone] phone [phone] fax  05/05/17 3:05 PM   Results for orders placed or performed in visit on 05/05/17  Urine Culture  Result Value Ref Range   Urine Culture, Routine Final report    Organism ID, Bacteria Comment   TSH  Result Value Ref Range   TSH 1.510 0.450 - 4.500 uIU/mL  CBC with Differential/Platelet  Result Value Ref Range   WBC 4.6 3.4 - 10.8 x10E3/uL   RBC 4.80 3.77 - 5.28 x10E6/uL   Hemoglobin 13.2 11.1 - 15.9 g/dL   Hematocrit 40.8 34.0 - 46.6 %   MCV 85 79 - 97 fL   MCH 27.5 26.6 - 33.0 pg   MCHC 32.4 31.5 - 35.7 g/dL   RDW 15.0 12.3 - 15.4 %   Platelets 193 150 - 379 x10E3/uL   Neutrophils 40 Not Estab. %   Lymphs 52 Not Estab. %   Monocytes 6 Not Estab. %   Eos 2 Not Estab. %   Basos 0 Not Estab. %   Neutrophils Absolute 1.8 1.4 - 7.0 x10E3/uL   Lymphocytes Absolute 2.4 0.7 - 3.1 x10E3/uL   Monocytes Absolute 0.3 0.1 - 0.9 x10E3/uL   EOS (ABSOLUTE) 0.1 0.0 - 0.4 x10E3/uL   Basophils Absolute 0.0 0.0 - 0.2 x10E3/uL   Immature Granulocytes 0 Not Estab. %   Immature Grans (Abs) 0.0 0.0 - 0.1 [id number]  Basic metabolic panel  Result Value Ref Range   Glucose 76 65 - 99 mg/dL   BUN 15 8 - 27 mg/dL   Creatinine, Ser 1.19 (H) 0.57 - 1.00 mg/dL   GFR calc non Af Amer 44 (L) >59 mL/min/1.73   GFR calc Af Amer 51 (L) >59 mL/min/1.73   BUN/Creatinine Ratio 13 12 - 28   Sodium 143 134 - 144 mmol/L   Potassium 4.4 3.5 - 5.2 mmol/L   Chloride 103 96 - 106 mmol/L   CO2 26 20 - 29 mmol/L   Calcium 9.2 8.7 - 10.3 mg/dL  Vitamin B12  Result Value Ref Range   Vitamin B-12 1,128 232 - 1,245 pg/mL  POCT glycosylated hemoglobin (Hb A1C)  Result Value Ref Range   Hemoglobin A1C 6.6   POCT urinalysis dipstick  Result Value Ref Range   Color, UA yellow yellow   Clarity, UA clear clear   Glucose, UA negative negative mg/dL   Bilirubin, UA negative negative   Ketones, POC UA negative negative mg/dL   Spec Grav, UA  1.015 1.010 - 1.025   Blood, UA moderate (A) negative   pH, UA 7.0 5.0 - 8.0   Protein Ur, POC negative negative mg/dL   Urobilinogen, UA 0.2 0.2 or 1.0 E.U./dL   Nitrite, UA Negative Negative   Leukocytes, UA Negative Negative  POCT Microscopic Urinalysis ([facility])  Result Value Ref Range   WBC,UR,HPF,POC None None WBC/hpf   RBC,UR,HPF,POC Few (A) None RBC/hpf   Bacteria None None, Too numerous to count   Mucus Absent Absent   Epithelial Cells, UR Per Microscopy None  None, Too numerous to count cells/hpf

## 2017-05-06 DIAGNOSIS — R262 Difficulty in walking, not elsewhere classified: Secondary | ICD-10-CM | POA: Diagnosis not present

## 2017-05-06 DIAGNOSIS — R2681 Unsteadiness on feet: Secondary | ICD-10-CM | POA: Diagnosis not present

## 2017-05-06 DIAGNOSIS — H8111 Benign paroxysmal vertigo, right ear: Secondary | ICD-10-CM | POA: Diagnosis not present

## 2017-05-06 DIAGNOSIS — R42 Dizziness and giddiness: Secondary | ICD-10-CM | POA: Diagnosis not present

## 2017-05-06 LAB — CBC WITH DIFFERENTIAL/PLATELET
BASOS ABS: 0 10*3/uL (ref 0.0–0.2)
Basos: 0 %
EOS (ABSOLUTE): 0.1 10*3/uL (ref 0.0–0.4)
EOS: 2 %
HEMATOCRIT: 40.8 % (ref 34.0–46.6)
HEMOGLOBIN: 13.2 g/dL (ref 11.1–15.9)
IMMATURE GRANS (ABS): 0 10*3/uL (ref 0.0–0.1)
Immature Granulocytes: 0 %
LYMPHS ABS: 2.4 10*3/uL (ref 0.7–3.1)
LYMPHS: 52 %
MCH: 27.5 pg (ref 26.6–33.0)
MCHC: 32.4 g/dL (ref 31.5–35.7)
MCV: 85 fL (ref 79–97)
MONOCYTES: 6 %
Monocytes Absolute: 0.3 10*3/uL (ref 0.1–0.9)
NEUTROS ABS: 1.8 10*3/uL (ref 1.4–7.0)
Neutrophils: 40 %
Platelets: 193 10*3/uL (ref 150–379)
RBC: 4.8 x10E6/uL (ref 3.77–5.28)
RDW: 15 % (ref 12.3–15.4)
WBC: 4.6 10*3/uL (ref 3.4–10.8)

## 2017-05-06 LAB — BASIC METABOLIC PANEL
BUN/Creatinine Ratio: 13 (ref 12–28)
BUN: 15 mg/dL (ref 8–27)
CHLORIDE: 103 mmol/L (ref 96–106)
CO2: 26 mmol/L (ref 20–29)
CREATININE: 1.19 mg/dL — AB (ref 0.57–1.00)
Calcium: 9.2 mg/dL (ref 8.7–10.3)
GFR, EST AFRICAN AMERICAN: 51 mL/min/{1.73_m2} — AB (ref >59)
GFR, EST NON AFRICAN AMERICAN: 44 mL/min/{1.73_m2} — AB (ref >59)
GLUCOSE: 76 mg/dL (ref 65–99)
POTASSIUM: 4.4 mmol/L (ref 3.5–5.2)
Sodium: 143 mmol/L (ref 134–144)

## 2017-05-06 LAB — URINE CULTURE

## 2017-05-06 LAB — VITAMIN B12: VITAMIN B 12: 1128 pg/mL (ref 232–1245)

## 2017-05-06 LAB — TSH: TSH: 1.51 u[IU]/mL (ref 0.450–4.500)

## 2017-05-10 NOTE — Telephone Encounter (Signed)
Note was completed.

## 2017-05-18 ENCOUNTER — Telehealth: Payer: Self-pay | Admitting: Family Medicine

## 2017-05-18 NOTE — Telephone Encounter (Signed)
Copied from [place] [phone]. Topic: Quick Communication - See Telephone Encounter >> May 18, 2017 10:35 AM Ivar Drape wrote: CRM for notification. See Telephone encounter for:  05/18/17. Patient said she was in to see Dr. Brigitte Pulse in September and the doctor filled out a form for the patient to send to [facility].  The patient said she didn't get the second page of the form and she would like to have it mailed to her address.

## 2017-05-18 NOTE — Telephone Encounter (Signed)
Do you recall this?  

## 2017-05-19 NOTE — Telephone Encounter (Signed)
On review of the chart, there is note on 02/14/17 office visit under the assessment stating that we would complete her [employer] forms by documenting her screening info and foot exam. I do not see a copy of these scanned into her chart. This sort of thing I would have signed then given to CMA to my complete and copy/scan/return properly - likely [place] at that point. She might need to get [place] another copy of the forms or see if we can download them from the [place] website if she needs this.

## 2017-05-20 ENCOUNTER — Ambulatory Visit: Payer: Self-pay | Admitting: *Deleted

## 2017-05-20 NOTE — Telephone Encounter (Signed)
Spoke with pt.  She will call [employer] and get them to fax form to [place] so we can complete 2 pages and fax it back to them.  When completed, need copy to be placed in Scan box.

## 2017-05-20 NOTE — Telephone Encounter (Signed)
Attempted to find form on [facility] website - unsuccessful. Attempted to call pt.  No voice mail is set up. She will need to supply [place] with 2nd page of form to be filled out

## 2017-05-20 NOTE — Telephone Encounter (Signed)
Pt  Was  Seen   Last  Week  At  [facility]   For  A  uti  Which  Was  Found   On  Lab  Tests   She  Was  Prescribed  Anti  Biotics   She  Reports   Pain r  Side    With  Frequent  Urination    Denies  Any other  Symptoms   She  Is  Speaking  In  Complete  sentances   And  Is  In no  Acute   Distress  .  Appt  Made  tommorow   At  [facility] . Pt  Advised   To  Go  To  Er  If  Pain  Worse  Or  Has  Any  Nausea  Or  Vomiting  Or  Other  Symptoms .    Reason for Disposition . Urinating more frequently than usual (i.e., frequency)  Answer Assessment - Initial Assessment Questions 1. SYMPTOM: "What's the main symptom you're concerned about?" (e.g., frequency, incontinence   Low  abd  Pain   Frequent   Urination    2. ONSET: "When did the  ________  start?"     3  Days  Ago   3. PAIN: "Is there any pain?" If so, ask: "How bad is it?" (Scale: 1-10; mild, moderate, severe)     R  Side   Inside  Her  Body    5   Or  6     4. CAUSE: "What do you think is causing the symptoms?"      Possible  Bladder  Infection    5. OTHER SYMPTOMS: "Do you have any other symptoms?" (e.g., fever, flank pain, blood in urine, pain with urination)     fREQUENT  URINATION  AND  PAIN   6. PREGNANCY: "Is there any chance you are pregnant?" "When was your last menstrual period?"    No  Protocols used: URINARY [facility]

## 2017-05-21 ENCOUNTER — Ambulatory Visit: Payer: Medicare HMO | Admitting: Osteopathic Medicine

## 2017-05-21 ENCOUNTER — Other Ambulatory Visit: Payer: Self-pay

## 2017-05-21 ENCOUNTER — Encounter: Payer: Self-pay | Admitting: Osteopathic Medicine

## 2017-05-21 VITALS — BP 116/70 | HR 56 | Temp 98.4°F | Resp 16 | Ht 63.0 in | Wt 190.6 lb

## 2017-05-21 DIAGNOSIS — R109 Unspecified abdominal pain: Secondary | ICD-10-CM

## 2017-05-21 DIAGNOSIS — R350 Frequency of micturition: Secondary | ICD-10-CM | POA: Diagnosis not present

## 2017-05-21 LAB — POCT URINALYSIS DIP (MANUAL ENTRY)
Bilirubin, UA: NEGATIVE
Glucose, UA: NEGATIVE mg/dL
Ketones, POC UA: NEGATIVE mg/dL
Leukocytes, UA: NEGATIVE
NITRITE UA: NEGATIVE
PH UA: 5.5 (ref 5.0–8.0)
Protein Ur, POC: NEGATIVE mg/dL
Spec Grav, UA: 1.015 (ref 1.010–1.025)
UROBILINOGEN UA: 0.2 U/dL

## 2017-05-21 MED ORDER — CIPROFLOXACIN HCL 250 MG PO TABS: 250.0000 mg | ORAL_TABLET | Freq: Two times a day (BID) | ORAL | 0 refills | Status: DC

## 2017-05-21 NOTE — Progress Notes (Signed)
HPI: Catherine Joseph is a 76 y.o. female with has a past medical history of Anemia, Bradycardia, Cataract, Chronic kidney disease, Diabetes mellitus type II, Hypertension, Osteoporosis, Syncope, and Weakness.  who presents to Primary Care at [facility] today, 05/21/17,  for chief complaint of:  Chief Complaint  Patient presents with  . Flank Pain    , saw Dr Brigitte Pulse back in September was given Cipro pain went away but is now back again     Urine frequency every hour or so for the past 5 days. Burning with urination. No fever/ chills, mild R LBP.   UCx over past year:  05/05/17 mixed flora  02/24/17 Proteus   11/19/16 mixed flora  09/30/16 no growth     Past medical, surgical, social and family history reviewed:  Patient Active Problem List   Diagnosis Date Noted  . Osteopenia 04/01/2016  . Microhematuria 11/11/2014  . Atrial fibrillation ([place]) 04/22/2014  . Chest pain 02/11/2014  . 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 Lung nodule 02/01/2012  . Mixed hyperlipidemia 08/03/2011  . Bradycardia 08/03/2011  . Dizzy spells 08/03/2011  . Hypertension 07/22/2011  . Diabetes mellitus, type II ([place]) 07/22/2011    Past Surgical History:  Procedure Laterality Date  . ABDOMINAL HYSTERECTOMY    . CESAREAN SECTION    . EYE SURGERY      Social History   Tobacco Use  . Smoking status: Never Smoker  . Smokeless tobacco: Never Used  Substance Use Topics  . Alcohol use: No    Family History  Problem Relation Age of Onset  . Diabetes Sister   . Hyperlipidemia Sister   . Hypertension Sister   . Diabetes Brother   . Hyperlipidemia Brother   . Hypertension Brother      Current medication list and allergy/intolerance information reviewed:    Current Outpatient Medications  Medication Sig Dispense Refill  . acetaminophen (TYLENOL) 500 MG tablet Take 1 tablet (500 mg total) by mouth every 6 (six) hours as needed. 30 tablet 0  . Alcohol Swabs (B-D SINGLE USE SWABS REGULAR)  PADS USE TO CHECK BLOOD SUGAR DAILY  100 each 3  . Blood Glucose Calibration (SURECHEK CONTROL SOLUTION) Normal LIQD Use to check blood sugar daily. Dx E11.65 1 each 3  . blood glucose meter kit and supplies Use to test blood sugar once daily. Dx: E11.65 1 each 0  . Blood Glucose Monitoring Suppl (TRUE METRIX AIR GLUCOSE METER) DEVI 1 each by Does not apply route daily. Use to test blood sugar once daily. Dx: E11.65 1 Device 0  . cloNIDine (CATAPRES) 0.1 MG tablet Take 1 tablet (0.1 mg total) by mouth at bedtime. 30 tablet 0  . clotrimazole (GYNE-LOTRIMIN 3) 2 % vaginal cream Place 1 Applicatorful at bedtime vaginally. 21 g 3  . diazepam (VALIUM) 2 MG tablet Take 1 tablet (2 mg total) every 6 (six) hours as needed by mouth (vertigo). 30 tablet 0  . flecainide (TAMBOCOR) 100 MG tablet Take 0.5 tablets (50 mg total) by mouth 2 (two) times daily. 180 tablet 3  . glipiZIDE (GLUCOTROL) 10 MG tablet TAKE 1 TABLET TWICE DAILY BEFORE A MEAL 180 tablet 0  . irbesartan-hydrochlorothiazide (AVALIDE) 300-12.5 MG tablet Take 1 tablet by mouth daily. 90 tablet 0  . meclizine (ANTIVERT) 25 MG tablet Take 1 tablet (25 mg total) 3 (three) times daily as needed by mouth for dizziness. 90 tablet 0  . metFORMIN (GLUCOPHAGE-XR) 500 MG 24 hr tablet TAKE 2 TABLETS EVERY DAY  WITH BREAKFAST 180 tablet 1  . PAZEO 0.7 % SOLN Place 1 drop into both eyes daily.  11  . polyethylene glycol powder (GLYCOLAX/MIRALAX) powder Take 17 g by mouth 2 (two) times daily as needed. (Patient taking differently: Take 17 g by mouth 2 (two) times daily as needed for moderate constipation. ) 500 g 1  . rivaroxaban (XARELTO) 20 MG TABS tablet Take 1 tablet (20 mg total) by mouth daily with supper. 90 tablet 3  . simvastatin (ZOCOR) 20 MG tablet Take 1 tablet (20 mg total) by mouth daily at 6 PM. 90 tablet 0  . TRUE METRIX BLOOD GLUCOSE TEST test strip USE TO TEST BLOOD SUGAR ONCE DAILY. 100 each 3  . TRUEPLUS LANCETS 28G MISC USE TO TEST BLOOD  SUGAR ONCE DAILY.  100 each 3   No current facility-administered medications for this visit.     Allergies  Allergen Reactions  . Codeine Other (See Comments)    vertigo      Review of Systems:  Constitutional:  No  fever, no chills  HEENT: No  headache, no vision change  Cardiac: No  chest pain  Respiratory:  No  shortness of breath. No  Cough  Gastrointestinal: No  abdominal pain, No  nausea, No  vomiting,  Genitourinary: +incontinence, No  abnormal genital bleeding, No abnormal genital discharge  Skin: No  Rash   Exam:  BP 116/70   Pulse (!) 56   Temp 98.4 F (36.9 C)   Resp 16   Ht _0  (1.6 m)   Wt 190 lb 9.6 oz (86.5 kg)   SpO2 99%   BMI 33.76 kg/m   Constitutional: VS see above. General Appearance: alert, well-developed, well-nourished, NAD  Eyes: Normal lids and conjunctive, non-icteric sclera  Ears, Nose, Mouth, Throat: MMM, Normal external inspection ears/nares/mouth/lips/gums. \  Neck: No masses, trachea midline.   Respiratory: Normal respiratory effort. no wheeze, no rhonchi, no rales  Cardiovascular: S1/S2 normal, no murmur, no rub/gallop auscultated. RRR.  Gastrointestinal: Nontender, no masses.   Musculoskeletal: Gait normal. No clubbing/cyanosis of digits.  [place] negative bilaterally     Results for orders placed or performed in visit on 05/21/17 (from the past 72 hour(s))  POCT urinalysis dipstick     Status: Abnormal   Collection Time: 05/21/17  3:55 PM  Result Value Ref Range   Color, UA yellow yellow   Clarity, UA clear clear   Glucose, UA negative negative mg/dL   Bilirubin, UA negative negative   Ketones, POC UA negative negative mg/dL   Spec Grav, UA 1.015 1.010 - 1.025   Blood, UA moderate (A) negative   pH, UA 5.5 5.0 - 8.0   Protein Ur, POC negative negative mg/dL   Urobilinogen, UA 0.2 0.2 or 1.0 E.U./dL   Nitrite, UA Negative Negative   Leukocytes, UA Negative Negative    No results found.   ASSESSMENT/PLAN:  Pt states already on vaginal estrogen but looks like clotrimazole vaginal cream is on her list - would have her bring bottles to appt. Consider tc urinary frequency, w/u for hematuria to include cysto / urology referral if no UTI on Cx, consider estrace or other topical E therapy  Flank pain - Plan: POCT urinalysis dipstick, Urine Culture  Urine frequency   Meds ordered this encounter  Medications  . ciprofloxacin (CIPRO) 250 MG tablet    Sig: Take 1 tablet (250 mg total) by mouth 2 (two) times daily.    Dispense:  6 tablet  Refill:  0       Patient Instructions   We will go ahead and send urine culture to confirm whether or not there is an infection.  We will send antibiotics and hopefully this will help your symptoms.  If there is no infection, and your symptoms persist, please make an appointment with Dr. Brigitte Pulse to follow-up on this issue.     IF you received an x-ray today, you will receive an invoice from [facility]. Please contact [facility] at [phone] with questions or concerns regarding your invoice.   IF you received labwork today, you will receive an invoice from [facility]. Please contact [facility] at [phone] with questions or concerns regarding your invoice.   Our billing staff will not be able to assist you with questions regarding bills from these companies.  You will be contacted with the lab results as soon as they are available. The fastest way to get your results is to activate your My Chart account. Instructions are located on the last page of this paperwork. If you have not heard from [place] regarding the results in 2 weeks, please contact this office.         Visit summary with medication list and pertinent instructions was printed for patient to review. All questions at time of visit were answered - patient instructed to contact office with any additional concerns. ER/RTC precautions were reviewed with the patient. Follow-up plan:  Return in about 1 week (around 05/28/2017) for recheck with Dr Brigitte Pulse if no better, sooner if worse/change.  Please note: voice recognition software was used to produce this document, and typos may escape review. Please contact me for any needed clarifications.

## 2017-05-21 NOTE — Patient Instructions (Addendum)
We will go ahead and send urine culture to confirm whether or not there is an infection.  We will send antibiotics and hopefully this will help your symptoms.  If there is no infection, and your symptoms persist, please make an appointment with Dr. Brigitte Pulse to follow-up on this issue.     IF you received an x-ray today, you will receive an invoice from [facility]. Please contact [facility]iology at [phone] with questions or concerns regarding your invoice.   IF you received labwork today, you will receive an invoice from [facility]. Please contact [facility] at [phone] with questions or concerns regarding your invoice.   Our billing staff will not be able to assist you with questions regarding bills from these companies.  You will be contacted with the lab results as soon as they are available. The fastest way to get your results is to activate your My Chart account. Instructions are located on the last page of this paperwork. If you have not heard from [place] regarding the results in 2 weeks, please contact this office.

## 2017-05-22 LAB — URINE CULTURE

## 2017-05-23 NOTE — Telephone Encounter (Signed)
Spoke with patient still having symptoms transferred to make appointment

## 2017-05-28 ENCOUNTER — Other Ambulatory Visit: Payer: Self-pay

## 2017-05-28 ENCOUNTER — Encounter: Payer: Self-pay | Admitting: Family Medicine

## 2017-05-28 ENCOUNTER — Ambulatory Visit (INDEPENDENT_AMBULATORY_CARE_PROVIDER_SITE_OTHER): Payer: Medicare HMO

## 2017-05-28 ENCOUNTER — Ambulatory Visit: Payer: Medicare HMO | Admitting: Family Medicine

## 2017-05-28 VITALS — BP 144/64 | HR 65 | Temp 97.9°F | Resp 18 | Ht 63.0 in | Wt 190.4 lb

## 2017-05-28 DIAGNOSIS — R102 Pelvic and perineal pain: Secondary | ICD-10-CM | POA: Diagnosis not present

## 2017-05-28 DIAGNOSIS — S32010S Wedge compression fracture of first lumbar vertebra, sequela: Secondary | ICD-10-CM | POA: Diagnosis not present

## 2017-05-28 DIAGNOSIS — R399 Unspecified symptoms and signs involving the genitourinary system: Secondary | ICD-10-CM

## 2017-05-28 DIAGNOSIS — M898X8 Other specified disorders of bone, other site: Secondary | ICD-10-CM

## 2017-05-28 DIAGNOSIS — R109 Unspecified abdominal pain: Secondary | ICD-10-CM

## 2017-05-28 DIAGNOSIS — Z8639 Personal history of other endocrine, nutritional and metabolic disease: Secondary | ICD-10-CM | POA: Diagnosis not present

## 2017-05-28 LAB — POCT URINALYSIS DIP (MANUAL ENTRY)
Bilirubin, UA: NEGATIVE
Glucose, UA: NEGATIVE mg/dL
Ketones, POC UA: NEGATIVE mg/dL
Leukocytes, UA: NEGATIVE
Nitrite, UA: NEGATIVE
Protein Ur, POC: NEGATIVE mg/dL
Spec Grav, UA: 1.025 (ref 1.010–1.025)
Urobilinogen, UA: 0.2 E.U./dL
pH, UA: 6 (ref 5.0–8.0)

## 2017-05-28 LAB — POC MICROSCOPIC URINALYSIS (UMFC): Mucus: ABSENT

## 2017-05-28 MED ORDER — DICLOFENAC SODIUM 1 % TD CREA: 2.0000 g | TOPICAL_CREAM | Freq: Four times a day (QID) | TRANSDERMAL | 3 refills | Status: DC

## 2017-05-28 MED ORDER — CYANOCOBALAMIN 1000 MCG/ML IJ SOLN
1000.0000 ug | Freq: Once | INTRAMUSCULAR | Status: AC
  Administered 2017-05-28: 1000 ug via INTRAMUSCULAR

## 2017-05-28 NOTE — Progress Notes (Addendum)
Subjective:  By signing my name below, I, Catherine Joseph, attest that this documentation has been prepared under the direction and in the presence of Delman Cheadle, MD Electronically Signed: Ladene Artist, ED Scribe 05/28/2017 at 4:05 PM.   Patient ID: Catherine Joseph, female    DOB: 16-Dec-1940, 76 y.o.   MRN: [id number]  Chief Complaint  Patient presents with  . Flank Pain    patient presents with R side flank pain x several weeks, states that she completed all medication given for similar pain, did not help   HPI Catherine Joseph is a 76 y.o. female who presents to Primary Care at [facility] complaining of intermittent pain at the top of the R hip/R flank x several months. Pt was seen 1 wk prior by a colleague. Pt has extensive h/o of benign microscopic hematuria s/p workup by [facility] in 2015 who reccommended UA recheck annually and rpt referral if worsening symptoms or gross hematuria occurs. Last seen by urology in 09/2014 and had a cystoscopy. Pt had abd/pelvis CT 3 months prior 02/28/17 showed normal kidneys and bladder. She was having significant RLQ and R flank pain during that time which turned out to be constipation. Positive family h/o ovarian CA in her sister. Pt is s/p hysterectomy though she still has her cervix and no adnexal masses seen on CT. She was noted to have a new L1 compression deformity with 35% height loss.   Pt was seen in the ER on 4/7 s/p a fall that occurred 1 week prior. She missed a couple steps while walking down stairs, fell onto her buttocks and has had intermittent R flank pain and upper central lumbar pain since. Pt reports that pain is exacerbated with heavy lifting and after activity; improved with laying down at night and temporarily improved with ibuprofen. Denies hematuria. Pt states when she was treated for constipation 3 months ago, constipation improved but R back/hip/flank pain did not.  She felt much better after the B12 shot we gave her on 11/15  and would like another.   Past Medical History:  Diagnosis Date  . Anemia   . Bradycardia   . Cataract   . Chronic kidney disease   . Diabetes mellitus type II   . Hypertension   . Osteoporosis   . Syncope   . Weakness    Current Outpatient Medications on File Prior to Visit  Medication Sig Dispense Refill  . Alcohol Swabs (B-D SINGLE USE SWABS REGULAR) PADS USE TO CHECK BLOOD SUGAR DAILY  100 each 3  . Blood Glucose Calibration (SURECHEK CONTROL SOLUTION) Normal LIQD Use to check blood sugar daily. Dx E11.65 1 each 3  . Blood Glucose Monitoring Suppl (TRUE METRIX AIR GLUCOSE METER) DEVI 1 each by Does not apply route daily. Use to test blood sugar once daily. Dx: E11.65 1 Device 0  . cloNIDine (CATAPRES) 0.1 MG tablet Take 1 tablet (0.1 mg total) by mouth at bedtime. 30 tablet 0  . clotrimazole (GYNE-LOTRIMIN 3) 2 % vaginal cream Place 1 Applicatorful at bedtime vaginally. 21 g 3  . flecainide (TAMBOCOR) 100 MG tablet Take 0.5 tablets (50 mg total) by mouth 2 (two) times daily. 180 tablet 3  . glipiZIDE (GLUCOTROL) 10 MG tablet TAKE 1 TABLET TWICE DAILY BEFORE A MEAL 180 tablet 0  . irbesartan-hydrochlorothiazide (AVALIDE) 300-12.5 MG tablet Take 1 tablet by mouth daily. 90 tablet 0  . metFORMIN (GLUCOPHAGE-XR) 500 MG 24 hr tablet TAKE 2 TABLETS EVERY DAY WITH  BREAKFAST 180 tablet 1  . PAZEO 0.7 % SOLN Place 1 drop into both eyes daily.  11  . polyethylene glycol powder (GLYCOLAX/MIRALAX) powder Take 17 g by mouth 2 (two) times daily as needed. (Patient taking differently: Take 17 g by mouth 2 (two) times daily as needed for moderate constipation. ) 500 g 1  . rivaroxaban (XARELTO) 20 MG TABS tablet Take 1 tablet (20 mg total) by mouth daily with supper. 90 tablet 3  . simvastatin (ZOCOR) 20 MG tablet Take 1 tablet (20 mg total) by mouth daily at 6 PM. 90 tablet 0  . TRUE METRIX BLOOD GLUCOSE TEST test strip USE TO TEST BLOOD SUGAR ONCE DAILY. 100 each 3  . TRUEPLUS LANCETS 28G MISC  USE TO TEST BLOOD SUGAR ONCE DAILY.  100 each 3  . acetaminophen (TYLENOL) 500 MG tablet Take 1 tablet (500 mg total) by mouth every 6 (six) hours as needed. (Patient not taking: Reported on 05/28/2017) 30 tablet 0  . blood glucose meter kit and supplies Use to test blood sugar once daily. Dx: E11.65 (Patient not taking: Reported on 05/28/2017) 1 each 0  . ciprofloxacin (CIPRO) 250 MG tablet Take 1 tablet (250 mg total) by mouth 2 (two) times daily. (Patient not taking: Reported on 05/28/2017) 6 tablet 0  . diazepam (VALIUM) 2 MG tablet Take 1 tablet (2 mg total) every 6 (six) hours as needed by mouth (vertigo). (Patient not taking: Reported on 05/28/2017) 30 tablet 0  . meclizine (ANTIVERT) 25 MG tablet Take 1 tablet (25 mg total) 3 (three) times daily as needed by mouth for dizziness. (Patient not taking: Reported on 05/28/2017) 90 tablet 0   No current facility-administered medications on file prior to visit.    Allergies  Allergen Reactions  . Codeine Other (See Comments)    vertigo    Past Surgical History:  Procedure Laterality Date  . ABDOMINAL HYSTERECTOMY    . CESAREAN SECTION    . EYE SURGERY     Family History  Problem Relation Age of Onset  . Diabetes Sister   . Hyperlipidemia Sister   . Hypertension Sister   . Diabetes Brother   . Hyperlipidemia Brother   . Hypertension Brother    Social History   Socioeconomic History  . Marital status: Widowed    Spouse name: None  . Number of children: None  . Years of education: None  . Highest education level: None  Social Needs  . Financial resource strain: None  . Food insecurity - worry: None  . Food insecurity - inability: None  . Transportation needs - medical: None  . Transportation needs - non-medical: None  Occupational History  . None  Tobacco Use  . Smoking status: Never Smoker  . Smokeless tobacco: Never Used  Substance and Sexual Activity  . Alcohol use: No  . Drug use: No  . Sexual activity: None  Other  Topics Concern  . None  Social History Narrative  . None   Depression screen [facility] 2/9 05/28/2017 05/21/2017 05/05/2017 04/13/2017 03/24/2017  Decreased Interest 0 0 0 0 0  Down, Depressed, Hopeless 0 0 0 0 0  PHQ - 2 Joseph 0 0 0 0 0    Review of Systems  Genitourinary: Negative for hematuria.  Musculoskeletal: Positive for back pain (R low).      Objective:   Physical Exam  Constitutional: She is oriented to person, place, and time. She appears well-developed and well-nourished. No distress.  HENT:  Head:  Normocephalic and atraumatic.  Eyes: Conjunctivae and EOM are normal.  Neck: Neck supple. No tracheal deviation present.  Cardiovascular: Normal rate.  Pulmonary/Chest: Effort normal and breath sounds normal. No respiratory distress.  Lungs clear.  Abdominal: There is no CVA tenderness.  Musculoskeletal: Normal range of motion.  Pain at the top of iliac crest on the R.   Neurological: She is alert and oriented to person, place, and time.  Skin: Skin is warm and dry.  Psychiatric: She has a normal mood and affect. Her behavior is normal.  Nursing note and vitals reviewed.  BP (!) 144/64 (BP Location: Right Arm, Patient Position: Sitting, Cuff Size: Normal)   Pulse 65   Temp 97.9 F (36.6 C) (Oral)   Resp 18   Ht _0  (1.6 m)   Wt 190 lb 6.4 oz (86.4 kg)   SpO2 99%   BMI 33.73 kg/m     Results for orders placed or performed in visit on 05/28/17  POCT urinalysis dipstick  Result Value Ref Range   Color, UA yellow yellow   Clarity, UA cloudy (A) clear   Glucose, UA negative negative mg/dL   Bilirubin, UA negative negative   Ketones, POC UA negative negative mg/dL   Spec Grav, UA 1.025 1.010 - 1.025   Blood, UA large (A) negative   pH, UA 6.0 5.0 - 8.0   Protein Ur, POC negative negative mg/dL   Urobilinogen, UA 0.2 0.2 or 1.0 E.U./dL   Nitrite, UA Negative Negative   Leukocytes, UA Negative Negative  POCT Microscopic Urinalysis ([facility])  Result Value Ref Range    WBC,UR,HPF,POC None None WBC/hpf   RBC,UR,HPF,POC Moderate (A) None RBC/hpf   Bacteria Few (A) None, Too numerous to count   Mucus Absent Absent   Epithelial Cells, UR Per Microscopy Few (A) None, Too numerous to count cells/hpf   Dg Pelvis 1-2 Views  Result Date: 05/28/2017 CLINICAL DATA:  Pain EXAM: PELVIS - 1-2 VIEW COMPARISON:  None. FINDINGS: No fracture or dislocation. Joint spaces appear normal. There is evidence of old trauma involving the left ischium with benign-appearing exostosis arising from the inferior left ischium measuring 1.6 x 1.5 cm. Sacroiliac joints appear normal. IMPRESSION: No acute fracture or dislocation. No appreciable arthropathy. Benign appearing exostosis arising from the inferior mid left ischium, likely of posttraumatic etiology. Electronically Signed   By: Lowella Grip III M.D.   On: 05/28/2017 16:42    Assessment & Plan:  Saw GI Dr. Benson Norway 12/2015 who said he didn't think pt needed another colonoscopy (though would get all of records from other cities before making a final decision.  Last saw urology 09/2014 who said f/u prn as cystoscopy was normal that day.   1. Urinary tract infection symptoms   2. Right flank pain   3. Iliac crest bone pain - requests topical.   4. Closed compression fracture of first lumbar vertebra, sequela   5. History of non anemic vitamin B12 deficiency     Orders Placed This Encounter  Procedures  . Urine Culture  . DG Pelvis 1-2 Views    Standing Status:   Future    Number of Occurrences:   1    Standing Expiration Date:   05/28/2018    Order Specific Question:   Reason for Exam (SYMPTOM  OR DIAGNOSIS REQUIRED)    Answer:   ttp over right lateral iliac crest x 8 mos - poss started after fall down stairs on buttocks 09/2016    Order  Specific Question:   Preferred imaging location?    Answer:   External  . Ambulatory referral to Orthopedic Surgery    Referral Priority:   Routine    Referral Type:   Surgical    Referral  Reason:   Specialty Services Required    Requested Specialty:   Orthopedic Surgery    Number of Visits Requested:   1  . POCT urinalysis dipstick  . POCT Microscopic Urinalysis ([facility])    Meds ordered this encounter  Medications  . cyanocobalamin ((VITAMIN B-12)) injection 1,000 mcg  . Diclofenac Sodium 1 % CREA    Sig: Place 2 g onto the skin 4 (four) times daily.    Dispense:  120 g    Refill:  3    I personally performed the services described in this documentation, which was scribed in my presence. The recorded information has been reviewed and considered, and addended by me as needed.   Delman Cheadle, M.D.  Primary Care at [facility] [address] [place],  [address] [phone] phone [phone] fax  05/28/17 5:05 PM

## 2017-05-28 NOTE — Patient Instructions (Addendum)
We will try to ensure that the provider you see for the spinal surgeon will be covered under your insurance plan.    Spinal Compression Fracture A spinal compression fracture is a collapse of the bones that form the spine (vertebrae). With this type of fracture, the vertebrae become squashed (compressed) into a wedge shape. Most compression fractures happen in the middle or lower part of the spine. What are the causes? This condition may be caused by:  Thinning and loss of density in the bones (osteoporosis). This is the most common cause.  A fall.  A car or motorcycle accident.  Cancer.  Trauma, such as a heavy, direct hit to the head.  What increases the risk? You may be at greater risk for a spinal compression fracture if you:  Are 14 years old or older.  Have osteoporosis.  Have certain types of cancer, including: ? Multiple myeloma. ? Lymphoma. ? Prostate cancer. ? Lung cancer. ? Breast cancer.  What are the signs or symptoms? Symptoms of this condition include:  Severe pain.  Pain that gets worse over time.  Pain that is worse when you stand, walk, sit, or bend.  Sudden pain that is so bad that it is hard for you to move.  Bending or humping of the spine.  Gradual loss of height.  Numbness, tingling, or weakness in the back and legs.  Trouble walking.  Your symptoms will depend on the cause of the fracture and how quickly it develops. For example, fractures that are caused by osteoporosis can cause few symptoms, no symptoms, or symptoms that develop slowly over time. How is this diagnosed? This condition may be diagnosed based on symptoms, medical history, and a physical exam. During the physical exam, your health care provider may tap along the length of your spine to check for tenderness. Tests may be done to confirm the diagnosis. They may include:  A bone density test to check for osteoporosis.  Imaging tests, such as a spine X-ray, a CT scan, or  MRI.  How is this treated? Treatment for this condition depends on the cause and severity of the condition.Some fractures, such as those that are caused by osteoporosis, may heal on their own with supportive care. This may include:  Pain medicine.  Rest.  A back brace.  Physical therapy exercises.  Medicine that reduces bone pain.  Calcium and vitamin D supplements.  Fractures that cause the back to become misshapen, cause nerve pain or weakness, or do not respond to other treatment may be treated with a surgical procedure, such as:  Vertebroplasty. In this procedure, bone cement is injected into the collapsed vertebrae to stabilize them.  Balloon kyphoplasty. In this procedure, the collapsed vertebrae are expanded with a balloon and then bone cement is injected into them.  Spinal fusion. In this procedure, the collapsed vertebrae are connected (fused) to normal vertebrae.  Follow these instructions at home: General instructions  Take medicines only as directed by your health care provider.  Do not drive or operate heavy machinery while taking pain medicine.  If directed, apply ice to the injured area: ? Put ice in a plastic bag. ? Place a towel between your skin and the bag. ? Leave the ice on for 30 minutes every two hours at first. Then apply the ice as needed.  Wear your neck brace or back brace as directed by your health care provider.  Do not drink alcohol. Alcohol can interfere with your treatment.  Keep all  follow-up visits as directed by your health care provider. This is important. It can help to prevent permanent injury, disability, and long-lasting (chronic) pain. Activity  Stay in bed (on bed rest) only as directed by your health care provider. Being on bed rest for too long can make your condition worse.  Return to your normal activities as directed by your health care provider. Ask what activities are safe for you.  Do exercises to improve motion and  strength in your back (physical therapy), as recommended by your health care provider.  Exercise regularly as directed by your health care provider. Contact a health care provider if:  You have a fever.  You develop a cough that makes your pain worse.  Your pain medicine is not helping.  Your pain does not get better over time.  You cannot return to your normal activities as planned or expected. Get help right away if:  Your pain is very bad and it suddenly gets worse.  You are unable to move any body part (paralysis) that is below the level of your injury.  You have numbness, tingling, or weakness in any body part that is below the level of your injury.  You cannot control your bladder or bowels. This information is not intended to replace advice given to you by your health care provider. Make sure you discuss any questions you have with your health care provider. Document Released: 06/07/2005 Document Revised: 02/03/2016 Document Reviewed: 06/11/2014 Elsevier Interactive Patient Education  [employer].

## 2017-05-29 LAB — URINE CULTURE

## 2017-05-31 ENCOUNTER — Other Ambulatory Visit: Payer: Self-pay | Admitting: Family Medicine

## 2017-05-31 DIAGNOSIS — E1165 Type 2 diabetes mellitus with hyperglycemia: Principal | ICD-10-CM

## 2017-05-31 DIAGNOSIS — E785 Hyperlipidemia, unspecified: Secondary | ICD-10-CM

## 2017-05-31 DIAGNOSIS — IMO0001 Reserved for inherently not codable concepts without codable children: Secondary | ICD-10-CM

## 2017-06-08 ENCOUNTER — Ambulatory Visit (INDEPENDENT_AMBULATORY_CARE_PROVIDER_SITE_OTHER): Payer: Medicare HMO

## 2017-06-08 ENCOUNTER — Ambulatory Visit (INDEPENDENT_AMBULATORY_CARE_PROVIDER_SITE_OTHER): Payer: Medicare HMO | Admitting: Orthopaedic Surgery

## 2017-06-08 ENCOUNTER — Encounter (INDEPENDENT_AMBULATORY_CARE_PROVIDER_SITE_OTHER): Payer: Self-pay | Admitting: Orthopaedic Surgery

## 2017-06-08 VITALS — BP 179/92 | HR 62 | Ht 63.0 in | Wt 190.0 lb

## 2017-06-08 DIAGNOSIS — R6889 Other general symptoms and signs: Secondary | ICD-10-CM | POA: Diagnosis not present

## 2017-06-08 DIAGNOSIS — M545 Low back pain: Secondary | ICD-10-CM

## 2017-06-08 DIAGNOSIS — S32000S Wedge compression fracture of unspecified lumbar vertebra, sequela: Secondary | ICD-10-CM | POA: Diagnosis not present

## 2017-06-08 NOTE — Progress Notes (Addendum)
Office Visit Note   Patient: Catherine Joseph           Date of Birth: 25-Jun-1940           MRN: [id number] Visit Date: 06/08/2017              Requested by: Shawnee Knapp, MD [address] [place], [place] [address] PCP: Shawnee Knapp, MD   Assessment & Plan: Visit Diagnoses:  1. Acute right-sided low back pain, with sciatica presence unspecified   2. Lumbar compression fracture, sequela     Plan: We discussed taking some calcium working on a walking program, following her diet carefully with her diabetes.  Cardiovascular fitness normal activity and fall prevention.  Office follow-up as needed.  Patient has a healed 30% L1 compression fracture with intact posterior cortex.  She should continue to improve and will not require any further treatment for her back other than a walking program calcium and vitamin D for her osteopenia.  Follow-Up Instructions: No Follow-up on file.   Orders:  Orders Placed This Encounter  Procedures  . XR Lumbar Spine 2-3 Views   No orders of the defined types were placed in this encounter.     Procedures: No procedures performed   Clinical Data: No additional findings.   Subjective: Chief Complaint  Patient presents with  . Lower Back - Pain  . Pelvis - Pain    HPI 76 year old female was going down the steps in March slipped fell landed on her buttocks.  She had increased pain in her back which gradually over several months improved.  She has been amatory had no numbness or tingling some of the pain radiated around to the right iliac crest.  She is back to normal activity levels but still has some mild discomfort.  She did not lose consciousness when she failed she denies dizziness being associated with that fall she does have history of atrial fibrillation as well as bradycardia.  Review of Systems  Constitutional: Negative for chills and diaphoresis.  HENT: Negative for ear discharge, ear pain and nosebleeds.   Eyes: Negative for  discharge and visual disturbance.  Respiratory: Negative for cough, choking and shortness of breath.   Cardiovascular: Negative for chest pain and palpitations.  Gastrointestinal: Negative for abdominal distention and abdominal pain.  Endocrine: Negative for cold intolerance and heat intolerance.  Genitourinary: Negative for flank pain and hematuria.  Skin: Negative for rash and wound.  Neurological: Negative for seizures and speech difficulty.  Hematological: Negative for adenopathy. Does not bruise/bleed easily.  Psychiatric/Behavioral: Negative for agitation and suicidal ideas.  3 history of lung nodule, PVCs, atrial fibrillation, bradycardia, hyperlipidemia, hypertension, type 2 diabetes.   Objective: Vital Signs: BP (!) 179/92   Pulse 62   Ht 5\' 3"  (1.6 m)   Wt 190 lb (86.2 kg)   BMI 33.66 kg/m   Physical Exam  Constitutional: She is oriented to person, place, and time. She appears well-developed.  HENT:  Head: Normocephalic.  Right Ear: External ear normal.  Left Ear: External ear normal.  Eyes: Pupils are equal, round, and reactive to light.  Neck: No tracheal deviation present. No thyromegaly present.  Cardiovascular: Normal rate.  Pulmonary/Chest: Effort normal.  Abdominal: Soft.  Neurological: She is alert and oriented to person, place, and time.  Skin: Skin is warm and dry.  Psychiatric: She has a normal mood and affect. Her behavior is normal.    Ortho Exam patient has no plantar foot ulcers.  She is ambulatory.  There is no scoliosis pelvis is level normal heel toe gait.  Specialty Comments:  No specialty comments available.  Imaging: No results found.   PMFS History: Patient Active Problem List   Diagnosis Date Noted  . Osteopenia 04/01/2016  . Microhematuria 11/11/2014  . Atrial fibrillation ([place]) 04/22/2014  . Chest pain 02/11/2014  . 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 Lung nodule 02/01/2012  . Mixed hyperlipidemia 08/03/2011  . Bradycardia  08/03/2011  . Dizzy spells 08/03/2011  . Hypertension 07/22/2011  . Diabetes mellitus, type II ([place]) 07/22/2011   Past Medical History:  Diagnosis Date  . Anemia   . Bradycardia   . Cataract   . Chronic kidney disease   . Diabetes mellitus type II   . Hypertension   . Osteoporosis   . Syncope   . Weakness     Family History  Problem Relation Age of Onset  . Diabetes Sister   . Hyperlipidemia Sister   . Hypertension Sister   . Diabetes Brother   . Hyperlipidemia Brother   . Hypertension Brother     Past Surgical History:  Procedure Laterality Date  . ABDOMINAL HYSTERECTOMY    . CESAREAN SECTION    . EYE SURGERY     Social History   Occupational History  . Not on file  Tobacco Use  . Smoking status: Never Smoker  . Smokeless tobacco: Never Used  Substance and Sexual Activity  . Alcohol use: No  . Drug use: No  . Sexual activity: Not on file

## 2017-06-08 NOTE — Telephone Encounter (Signed)
-----   Message from Emeterio Reeve, DO sent at 05/23/2017  8:16 AM EST ----- Urine culture grew multiple organisms but none predominant, this typically happens when a sample is contaminated with vaginal bacteria and usually this means there is no urinary tract infection - if symptoms persist, would recommend follow-up in the office to recollect another sample and for a provider to perform a pelvic exam to determine if vaginal issues may be the cause of her urinary symptoms or if she might need a referral to a specialist

## 2017-06-08 NOTE — Progress Notes (Signed)
Spoke with pt. Gave message verbatim as written. Pt had no questions nor symptoms. Refused needing to see the doctor at this time. Will call if any problem

## 2017-06-10 DIAGNOSIS — E113293 Type 2 diabetes mellitus with mild nonproliferative diabetic retinopathy without macular edema, bilateral: Secondary | ICD-10-CM | POA: Diagnosis not present

## 2017-06-10 DIAGNOSIS — H524 Presbyopia: Secondary | ICD-10-CM | POA: Diagnosis not present

## 2017-06-20 ENCOUNTER — Other Ambulatory Visit: Payer: Self-pay | Admitting: Family Medicine

## 2017-07-21 ENCOUNTER — Other Ambulatory Visit: Payer: Self-pay | Admitting: Family Medicine

## 2017-07-25 NOTE — Progress Notes (Signed)
Patient ID: Catherine Joseph, female   DOB: February 17, 1941, 77 y.o.   MRN: [id number]   77 y.o.  [place] female history of PAF on flecainide. Has been on coumadin and now xarelto for anticoagulation History of atypical chest pain with normal stress echo 2013 and noraml myovue 03/21/14   Travels to [place] for extended periods  Lives with daughter/husband and 3 grand children ages 78, 38 and 94  No palpitations dyspnea or chest pain No bleeding issues Dr Brigitte Pulse has checked her lab work in September and ok     ROS: Denies fever, malais, weight loss, blurry vision, decreased visual acuity, cough, sputum, SOB, hemoptysis, pleuritic pain, palpitaitons, heartburn, abdominal pain, melena, lower extremity edema, claudication, or rash.  All other systems reviewed and negative  General: BP 138/72   Pulse 72   Ht '5\' 3"'$  (1.6 m)   Wt 193 lb 4 oz (87.7 kg)   SpO2 99%   BMI 34.23 kg/m  Affect appropriate Healthy:  appears stated age 18: normal Neck supple with no adenopathy JVP normal no bruits no thyromegaly Lungs clear with no wheezing and good diaphragmatic motion Heart:  S1/S2 no murmur, no rub, gallop or click PMI normal Abdomen: benighn, BS positve, no tenderness, no AAA no bruit.  No HSM or HJR Distal pulses intact with no bruits No edema Neuro non-focal Skin warm and dry No muscular weakness    Current Outpatient Medications  Medication Sig Dispense Refill  . acetaminophen (TYLENOL) 500 MG tablet Take 1 tablet (500 mg total) by mouth every 6 (six) hours as needed. 30 tablet 0  . Alcohol Swabs (B-D SINGLE USE SWABS REGULAR) PADS USE TO CHECK BLOOD SUGAR DAILY  100 each 3  . Blood Glucose Calibration (SURECHEK CONTROL SOLUTION) Normal LIQD Use to check blood sugar daily. Dx E11.65 1 each 3  . blood glucose meter kit and supplies Use to test blood sugar once daily. Dx: E11.65 1 each 0  . Blood Glucose Monitoring Suppl (TRUE METRIX AIR GLUCOSE METER) DEVI 1 each by Does not apply route  daily. Use to test blood sugar once daily. Dx: E11.65 1 Device 0  . cloNIDine (CATAPRES) 0.1 MG tablet TAKE 1 TABLET AT BEDTIME 90 tablet 0  . clotrimazole (GYNE-LOTRIMIN 3) 2 % vaginal cream Place 1 Applicatorful at bedtime vaginally. 21 g 3  . flecainide (TAMBOCOR) 100 MG tablet Take 0.5 tablets (50 mg total) by mouth 2 (two) times daily. 180 tablet 3  . glipiZIDE (GLUCOTROL) 10 MG tablet TAKE 1 TABLET TWICE DAILY BEFORE A MEAL 180 tablet 0  . irbesartan-hydrochlorothiazide (AVALIDE) 300-12.5 MG tablet TAKE 1 TABLET EVERY DAY 90 tablet 0  . metFORMIN (GLUCOPHAGE-XR) 500 MG 24 hr tablet TAKE 2 TABLETS EVERY DAY WITH BREAKFAST 180 tablet 1  . PAZEO 0.7 % SOLN Place 1 drop into both eyes daily.  11  . polyethylene glycol powder (GLYCOLAX/MIRALAX) powder Take 17 g by mouth 2 (two) times daily as needed. (Patient taking differently: Take 17 g by mouth 2 (two) times daily as needed for moderate constipation. ) 500 g 1  . rivaroxaban (XARELTO) 20 MG TABS tablet Take 1 tablet (20 mg total) by mouth daily with supper. 90 tablet 3  . simvastatin (ZOCOR) 20 MG tablet TAKE 1 TABLET (20 MG TOTAL) BY MOUTH DAILY AT 6 PM. 90 tablet 0  . TRUE METRIX BLOOD GLUCOSE TEST test strip TEST BLOOD SUGAR ONE TIME DAILY 100 each 3  . TRUEPLUS LANCETS 28G MISC USE TO TEST  BLOOD SUGAR ONCE DAILY. 100 each 3   No current facility-administered medications for this visit.     Allergies  Codeine  Electrocardiogram:   08/10/14 SR rate 59 normal  07/22/16 SR rate 63 normal  07/22/16  SR 63 normal   Assessment and Plan  PAF: maintains NSR on flecainide Anticoagulation with xarelto  HTN:  Well controlled.  Continue current medications and low sodium Dash type diet.   Chol:   Cholesterol is at goal.  Continue current dose of statin and diet Rx.  No myalgias or side effects.  F/U  LFT's in 6 months. Lab Results  Component Value Date   LDLCALC 89 11/29/2015   DM:  Discussed low carb diet.  Target hemoglobin A1c is 6.5 or  less.  Continue current medications. Lab Results  Component Value Date   HGBA1C 6.6 05/05/2017    F/U with me in a year   Jenkins Rouge

## 2017-08-01 ENCOUNTER — Ambulatory Visit: Payer: Medicare HMO | Admitting: Cardiovascular Disease

## 2017-08-01 ENCOUNTER — Encounter: Payer: Self-pay | Admitting: Cardiovascular Disease

## 2017-08-01 VITALS — BP 138/72 | HR 72 | Ht 63.0 in | Wt 193.2 lb

## 2017-08-01 DIAGNOSIS — R6889 Other general symptoms and signs: Secondary | ICD-10-CM | POA: Diagnosis not present

## 2017-08-01 DIAGNOSIS — I480 Paroxysmal atrial fibrillation: Secondary | ICD-10-CM | POA: Diagnosis not present

## 2017-08-01 DIAGNOSIS — E1159 Type 2 diabetes mellitus with other circulatory complications: Secondary | ICD-10-CM | POA: Diagnosis not present

## 2017-08-01 DIAGNOSIS — E1165 Type 2 diabetes mellitus with hyperglycemia: Secondary | ICD-10-CM | POA: Diagnosis not present

## 2017-08-01 NOTE — Patient Instructions (Signed)

## 2017-08-08 ENCOUNTER — Encounter: Payer: Self-pay | Admitting: Family Medicine

## 2017-08-08 ENCOUNTER — Ambulatory Visit (INDEPENDENT_AMBULATORY_CARE_PROVIDER_SITE_OTHER): Payer: Medicare HMO | Admitting: Family Medicine

## 2017-08-08 ENCOUNTER — Other Ambulatory Visit: Payer: Self-pay

## 2017-08-08 ENCOUNTER — Other Ambulatory Visit: Payer: Self-pay | Admitting: Family Medicine

## 2017-08-08 VITALS — BP 132/72 | HR 70 | Temp 98.5°F | Resp 16 | Ht 63.0 in | Wt 191.0 lb

## 2017-08-08 DIAGNOSIS — E119 Type 2 diabetes mellitus without complications: Secondary | ICD-10-CM

## 2017-08-08 DIAGNOSIS — IMO0001 Reserved for inherently not codable concepts without codable children: Secondary | ICD-10-CM

## 2017-08-08 DIAGNOSIS — E1165 Type 2 diabetes mellitus with hyperglycemia: Secondary | ICD-10-CM

## 2017-08-08 DIAGNOSIS — I10 Essential (primary) hypertension: Secondary | ICD-10-CM | POA: Diagnosis not present

## 2017-08-08 DIAGNOSIS — E785 Hyperlipidemia, unspecified: Secondary | ICD-10-CM | POA: Diagnosis not present

## 2017-08-08 DIAGNOSIS — E782 Mixed hyperlipidemia: Secondary | ICD-10-CM | POA: Diagnosis not present

## 2017-08-08 DIAGNOSIS — M8580 Other specified disorders of bone density and structure, unspecified site: Secondary | ICD-10-CM | POA: Diagnosis not present

## 2017-08-08 DIAGNOSIS — H8113 Benign paroxysmal vertigo, bilateral: Secondary | ICD-10-CM

## 2017-08-08 DIAGNOSIS — R3129 Other microscopic hematuria: Secondary | ICD-10-CM | POA: Diagnosis not present

## 2017-08-08 DIAGNOSIS — Z8639 Personal history of other endocrine, nutritional and metabolic disease: Secondary | ICD-10-CM

## 2017-08-08 LAB — POCT URINALYSIS DIP (MANUAL ENTRY)
BILIRUBIN UA: NEGATIVE
Glucose, UA: NEGATIVE mg/dL
LEUKOCYTES UA: NEGATIVE
Nitrite, UA: NEGATIVE
PH UA: 7.5 (ref 5.0–8.0)
SPEC GRAV UA: 1.015 (ref 1.010–1.025)
Urobilinogen, UA: 0.2 E.U./dL

## 2017-08-08 LAB — POC MICROSCOPIC URINALYSIS (UMFC): Mucus: ABSENT

## 2017-08-08 LAB — POCT GLYCOSYLATED HEMOGLOBIN (HGB A1C): HEMOGLOBIN A1C: 6.6

## 2017-08-08 MED ORDER — MECLIZINE HCL 25 MG PO TABS
25.0000 mg | ORAL_TABLET | Freq: Three times a day (TID) | ORAL | 2 refills | Status: DC | PRN
Start: 2017-08-08 — End: 2018-03-20

## 2017-08-08 NOTE — Progress Notes (Deleted)
Optho? Dr. Idolina Primer at [facility] 08/02/16

## 2017-08-08 NOTE — Progress Notes (Addendum)
I,Arielle J Pollard,acting as a scribe for Shawnee Knapp, MD.,have documented all relevant documentation on the behalf of Shawnee Knapp, MD,as directed by  Shawnee Knapp, MD while in the presence of Shawnee Knapp, MD. 08/08/2017 4:30 pm Subjective:    Patient ID: Catherine Joseph, female    DOB: 08-30-1940, 77 y.o.   MRN: [id number]   Chief Complaint  Patient presents with  . Follow-up    still having vertigo     HPI  Catherine Joseph is a delightful 77 yo with a h/o well controlled DM who is here today for a 3 mo f/u. She is concerned because she is still having daily vertigo though if she takes a meclizine whenever it happens that day, it will be relieved w/in several minutes and not return that day. However, if she doesn't take the meclizine - "it gets bad."  Vertigo typically happens when she rolls over in bed - in a.m. Or evening, can roll to either side.  VERTIGO: The pt the presented with acute 1 day episode of severe vertigo to the ER on 04/08/2017 that was so severe that she was unable to ambulatate and exacerbate with change. Head CT showed mild atrophy and arterial calcifications with MRI showing chronic microvascular disease. Pt. was discharged on Meclizine. Over the next week, she was seen several times through my partners. The pt discontinued her HTN medications, thinking it started the vertigo. Pt restarted HTN medications and referred to vestibular PT which she tried several times but did not feel she got any benefit from so eventually stopped doing the daily home maneuvers.  I initially saw the pt 1 month after the vertigo episode. She was still dependednt on TID Meclizine. Symptoms showed progression to be right positional change as well as the left. I referred her to ENT and added in diazepam 2 mg PRN for the vertigo in addition to her Meclizine. Pt never saw ENT, and stated she no longer needed the referral 3 weeks after it was made and never used diazepam significantly. Pt has had repeated  normal urinalysis and cultures, thyroid, CBC, and metabolic panels in w/u.   She notes that she used to have dry mouth at night, she drank water to resolve this. She denies issues with hearing.   Had a.m. Presyncope and fatigue lightheaded dizziness when pt presented at the beginning of October 2018 which she reported started after her sister passed away in January 20, 2017. This has now resolved.  Hx proximal afib rhythm controlled on flecainide and anticoagulated on Xarelto. Heart rate controlled in the 60's. Pt saw cardiologist, Dr. Johnsie Cancel, last week in annual visit.  DMII: Blood sugar is well controlled on glipidize 10 bidac and metformin XR 1g qam. She notes that she just saw Dr. Idolina Primer for her eyes.  She notes that her Blood Sugars where 107 for 2 days. She notes that feels good even if it is low 80-90s. She notes that she eats if she has any rare hypoglycemic sxs so never sees an actual low #.  Back/hip pain: H/o compression fracture L1 recently diagnosed: Osteoporosis: She notes that she saw Dr. Lorin Mercy who told her that she has to exercise and take calcium and vitamin D for her stable healing compression fracture. She notes that she is taking 4-8 calcium pills per day. She also drinks a glass of milk and eats vegetables.  She notes that she has been walking for exercise. She notes that she feels a little  pain on her left hip and left lower pain.   HTN: She notes that she checks her BP at home, and has been normal recently.   Past Medical History:  Diagnosis Date  . Anemia   . Bradycardia   . Cataract   . Chronic kidney disease   . Diabetes mellitus type II   . Hypertension   . Osteoporosis   . Syncope   . Weakness     Past Surgical History:  Procedure Laterality Date  . ABDOMINAL HYSTERECTOMY    . CESAREAN SECTION    . EYE SURGERY        Current Outpatient Medications on File Prior to Visit  Medication Sig Dispense Refill  . acetaminophen (TYLENOL) 500 MG tablet Take 1 tablet (500  mg total) by mouth every 6 (six) hours as needed. 30 tablet 0  . Alcohol Swabs (B-D SINGLE USE SWABS REGULAR) PADS USE TO CHECK BLOOD SUGAR DAILY  100 each 3  . Blood Glucose Calibration (SURECHEK CONTROL SOLUTION) Normal LIQD Use to check blood sugar daily. Dx E11.65 1 each 3  . blood glucose meter kit and supplies Use to test blood sugar once daily. Dx: E11.65 1 each 0  . Blood Glucose Monitoring Suppl (TRUE METRIX AIR GLUCOSE METER) DEVI 1 each by Does not apply route daily. Use to test blood sugar once daily. Dx: E11.65 1 Device 0  . cloNIDine (CATAPRES) 0.1 MG tablet TAKE 1 TABLET AT BEDTIME 90 tablet 0  . clotrimazole (GYNE-LOTRIMIN 3) 2 % vaginal cream Place 1 Applicatorful at bedtime vaginally. 21 g 3  . flecainide (TAMBOCOR) 100 MG tablet Take 0.5 tablets (50 mg total) by mouth 2 (two) times daily. 180 tablet 3  . glipiZIDE (GLUCOTROL) 10 MG tablet TAKE 1 TABLET TWICE DAILY BEFORE A MEAL 180 tablet 0  . irbesartan-hydrochlorothiazide (AVALIDE) 300-12.5 MG tablet TAKE 1 TABLET EVERY DAY 90 tablet 0  . metFORMIN (GLUCOPHAGE-XR) 500 MG 24 hr tablet TAKE 2 TABLETS EVERY DAY WITH BREAKFAST 180 tablet 1  . PAZEO 0.7 % SOLN Place 1 drop into both eyes daily.  11  . polyethylene glycol powder (GLYCOLAX/MIRALAX) powder Take 17 g by mouth 2 (two) times daily as needed. (Patient taking differently: Take 17 g by mouth 2 (two) times daily as needed for moderate constipation. ) 500 g 1  . rivaroxaban (XARELTO) 20 MG TABS tablet Take 1 tablet (20 mg total) by mouth daily with supper. 90 tablet 3  . simvastatin (ZOCOR) 20 MG tablet TAKE 1 TABLET (20 MG TOTAL) BY MOUTH DAILY AT 6 PM. 90 tablet 0  . TRUE METRIX BLOOD GLUCOSE TEST test strip TEST BLOOD SUGAR ONE TIME DAILY 100 each 3  . TRUEPLUS LANCETS 28G MISC USE TO TEST BLOOD SUGAR ONCE DAILY. 100 each 3   No current facility-administered medications on file prior to visit.     Allergies  Allergen Reactions  . Codeine Other (See Comments)     vertigo   Family History  Problem Relation Age of Onset  . Diabetes Sister   . Hyperlipidemia Sister   . Hypertension Sister   . Diabetes Brother   . Hyperlipidemia Brother   . Hypertension Brother     Social History   Socioeconomic History  . Marital status: Widowed    Spouse name: None  . Number of children: None  . Years of education: None  . Highest education level: None  Social Needs  . Financial resource strain: None  . Food insecurity -  worry: None  . Food insecurity - inability: None  . Transportation needs - medical: None  . Transportation needs - non-medical: None  Occupational History  . None  Tobacco Use  . Smoking status: Never Smoker  . Smokeless tobacco: Never Used  Substance and Sexual Activity  . Alcohol use: No  . Drug use: No  . Sexual activity: None  Other Topics Concern  . None  Social History Narrative  . None    Depression screen [facility] 2/9 08/08/2017 05/28/2017 05/21/2017 05/05/2017 04/13/2017  Decreased Interest 0 0 0 0 0  Down, Depressed, Hopeless 0 0 0 0 0  PHQ - 2 Joseph 0 0 0 0 0     Review of Systems  Constitutional: Negative for activity change, appetite change, chills, diaphoresis, fever and unexpected weight change.  HENT: Negative for congestion, ear discharge, ear pain, facial swelling, hearing loss, sinus pain, tinnitus and voice change.   Eyes: Negative for visual disturbance.  Gastrointestinal: Negative for constipation and diarrhea.  Genitourinary: Positive for flank pain.  Musculoskeletal: Positive for arthralgias (Rt hip when walking) and back pain.  Neurological: Positive for dizziness, light-headedness and headaches (occasional, quickly relieved w/ otc meds).  All other systems reviewed and are negative.    Objective:   Physical Exam  Constitutional: She is oriented to person, place, and time. She appears well-developed and well-nourished. No distress.  HENT:  Head: Normocephalic and atraumatic.  Right Ear: Hearing,  tympanic membrane and external ear normal.  Left Ear: Hearing, tympanic membrane and external ear normal.  Nose: Mucosal edema present.  Eyes: Conjunctivae are normal. Pupils are equal, round, and reactive to light. No scleral icterus.  Neck: Normal range of motion. Neck supple. No thyromegaly present.  Cardiovascular: Normal rate, regular rhythm and intact distal pulses.  Murmur heard.  Systolic murmur is present with a grade of 2/6. Pulmonary/Chest: Effort normal and breath sounds normal. No respiratory distress.  Abdominal: There is no tenderness.  Musculoskeletal: She exhibits no edema.       Right hip: She exhibits no tenderness (no tenderness to palpation over trochanteric bursa), no bony tenderness, no crepitus and no deformity.       Left hip: She exhibits no tenderness (no tenderness to palpation over trochanteric bursa), no bony tenderness, no crepitus and no deformity.       Lumbar back: She exhibits no bony tenderness, no swelling and no pain.  Lymphadenopathy:    She has no cervical adenopathy.  Neurological: She is alert and oriented to person, place, and time.  Skin: Skin is warm and dry. She is not diaphoretic. No erythema.  Psychiatric: She has a normal mood and affect. Her behavior is normal.   Diabetic Foot Exam - Simple   Simple Foot Form Diabetic Foot exam was performed with the following findings:  Yes 08/08/2017  3:50 PM  Visual Inspection No deformities, no ulcerations, no other skin breakdown bilaterally:  Yes Sensation Testing Intact to touch and monofilament testing bilaterally:  Yes Pulse Check Posterior Tibialis and Dorsalis pulse intact bilaterally:  Yes Comments      BP 132/72   Pulse 70   Temp 98.5 F (36.9 C)   Resp 16   Ht '5\' 3"'$  (1.6 m)   Wt 191 lb (86.6 kg)   SpO2 97%   BMI 33.83 kg/m      Assessment & Plan:   1. Type 2 diabetes mellitus without complication, without long-term current use of insulin (HCC) - just saw Dr. Idolina Primer  at [facility] last wk - will await report to add into [facility] DM eye exam. Well controlled on glipizide 10 bidqc, metformin XR '1000mg'$  qam  2. Benign paroxysmal positional vertigo due to bilateral vestibular disorder - sxs minimal - just a few minutes once a day as long as she takes meclizine '25mg'$  qd as soon as sxs occur - reassured ok to cont -gave warning signs if any neurologic changes, hearing, tinnitus, balance, HA sxs dev or vertigo is worsening, rec she call for ENT referral - pt agrees but wants to hold off on referral for now.  3. Microhematuria - benign, long-standing h/o  4. Essential hypertension - under much better control with clonidine 0.'1mg'$  qhs, irbesartan-hctz 300-12.5 qd,   5. Osteopenia, unspecified location - last DEXA 07/02/2016 at [facility] -1.7 in L spine - recently found to have compression fracture at L1 - saw ortho - Dr. Lorin Mercy who advised exercise, walk, and max ca/vit D intake. Pt getting sig calcium in diet so decrease ca supp to 400-500 qd.  6. Mixed hyperlipidemia - on simvastatin 20 - pt doesn't like to come in fasting due to meds so last lipids panel 11/2015 but was at goal - check LDL-C today    Orders Placed This Encounter  Procedures  . Microalbumin/Creatinine Ratio, Urine  . LDL Cholesterol, Direct  . Comprehensive metabolic panel  . POCT glycosylated hemoglobin (Hb A1C)  . POCT urinalysis dipstick  . POCT Microscopic Urinalysis ([facility])  . [facility] DIABETES FOOT EXAM    Meds ordered this encounter  Medications  . meclizine (ANTIVERT) 25 MG tablet    Sig: Take 1 tablet (25 mg total) by mouth 3 (three) times daily as needed for dizziness.    Dispense:  90 tablet    Refill:  2  . simvastatin (ZOCOR) 20 MG tablet    Sig: TAKE 1 TABLET EVERY DAY  AT  6PM    Dispense:  90 tablet    Refill:  3  . irbesartan-hydrochlorothiazide (AVALIDE) 300-12.5 MG tablet    Sig: Take 1 tablet by mouth daily.    Dispense:  90 tablet    Refill:  1  . glipiZIDE (GLUCOTROL) 10 MG  tablet    Sig: Take 1 tablet (10 mg total) by mouth 2 (two) times daily before a meal.    Dispense:  180 tablet    Refill:  1  . cloNIDine (CATAPRES) 0.1 MG tablet    Sig: Take 1 tablet (0.1 mg total) by mouth at bedtime.    Dispense:  90 tablet    Refill:  3    I personally performed the services described in this documentation, which was scribed in my presence. The recorded information has been reviewed and considered, and addended by me as needed.   Delman Cheadle, M.D.  Primary Care at [facility] [place], [place] [address] [phone] phone [phone] fax  08/10/17 1:32 AM  Results for orders placed or performed in visit on 08/08/17  Microalbumin/Creatinine Ratio, Urine  Result Value Ref Range   Creatinine, Urine 173.0 Not Estab. mg/dL   Microalbumin, Urine 15.7 Not Estab. ug/mL   Microalb/Creat Ratio 9.1 0.0 - 30.0 mg/g creat  LDL Cholesterol, Direct  Result Value Ref Range   LDL Direct 92 0 - 99 mg/dL  Comprehensive metabolic panel  Result Value Ref Range   Glucose 117 (H) 65 - 99 mg/dL   BUN 19 8 - 27 mg/dL  Creatinine, Ser 1.13 (H) 0.57 - 1.00 mg/dL   GFR calc non Af Amer 47 (L) >59 mL/min/1.73   GFR calc Af Amer 54 (L) >59 mL/min/1.73   BUN/Creatinine Ratio 17 12 - 28   Sodium 144 134 - 144 mmol/L   Potassium 4.4 3.5 - 5.2 mmol/L   Chloride 102 96 - 106 mmol/L   CO2 27 20 - 29 mmol/L   Calcium 9.7 8.7 - 10.3 mg/dL   Total Protein 8.0 6.0 - 8.5 g/dL   Albumin 4.5 3.5 - 4.8 g/dL   Globulin, Total 3.5 1.5 - 4.5 g/dL   Albumin/Globulin Ratio 1.3 1.2 - 2.2   Bilirubin Total 0.3 0.0 - 1.2 mg/dL   Alkaline Phosphatase 107 39 - 117 IU/L   AST 18 0 - 40 IU/L   ALT 11 0 - 32 IU/L  POCT glycosylated hemoglobin (Hb A1C)  Result Value Ref Range   Hemoglobin A1C 6.6   POCT urinalysis dipstick  Result Value Ref Range   Color, UA yellow yellow   Clarity, UA clear clear   Glucose, UA negative negative mg/dL   Bilirubin, UA negative negative     Ketones, POC UA trace (5) (A) negative mg/dL   Spec Grav, UA 1.015 1.010 - 1.025   Blood, UA small (A) negative   pH, UA 7.5 5.0 - 8.0   Protein Ur, POC trace (A) negative mg/dL   Urobilinogen, UA 0.2 0.2 or 1.0 E.U./dL   Nitrite, UA Negative Negative   Leukocytes, UA Negative Negative  POCT Microscopic Urinalysis ([facility])  Result Value Ref Range   WBC,UR,HPF,POC None None WBC/hpf   RBC,UR,HPF,POC Moderate (A) None RBC/hpf   Bacteria Few (A) None, Too numerous to count   Mucus Absent Absent   Epithelial Cells, UR Per Microscopy None None, Too numerous to count cells/hpf

## 2017-08-08 NOTE — Patient Instructions (Addendum)
Make sure you are taking a daily calcium/vitamin D supplement. Try to find calcium supplement with 500 to 600 mg of calcium in it and as much vitamin D as you can.  Take this at least once a day, and not with other calcium sources for maximum absorption.   IF you received an x-ray today, you will receive an invoice from [facility] Rad[facility]. Please contact [facility] at [phone] with questions or concerns regarding your invoice.   IF you received labwork today, you will receive an invoice from [facility]. Please contact [facility] at [phone] with questions or concerns regarding your invoice.   Our billing staff will not be able to assist you with questions regarding bills from these companies.  You will be contacted with the lab results as soon as they are available. The fastest way to get your results is to activate your My Chart account. Instructions are located on the last page of this paperwork. If you have not heard from [place] regarding the results in 2 weeks, please contact this office.      Calcium Intake Recommendations Calcium is a mineral that affects many functions in the body, including:  Blood clotting.  Blood vessel function.  Nerve impulse conduction.  Hormone secretion.  Muscle contraction.  Bone and teeth functions.  Most of your body's calcium supply is stored in your bones and teeth. When your calcium stores are low, you may be at risk for low bone mass, bone loss, and bone fractures. Consuming enough calcium helps to grow healthy bones and teeth and to prevent breakdown over time. It is very important that you get enough calcium if you are:  A child undergoing rapid growth.  An adolescent girl.  A pre- or post-menopausal woman.  A woman whose menstrual cycle has stopped due to anorexia nervosa or regular intense exercise.  An individual with lactose intolerance or a milk allergy.  A vegetarian.  What is my plan? Try to consume the  recommended amount of calcium daily based on your age. Depending on your overall health, your health care provider may recommend increased calcium intake.General daily calcium intake recommendations by age are:  Birth to 6 months: 200 mg.  Infants 7 to 12 months: 260 mg.  Children 1 to 3 years: 700 mg.  Children 4 to 8 years: 1,000 mg.  Children 9 to 13 years: 1,300 mg.  Teens 14 to 18 years: 1,300 mg.  Adults 19 to 50 years: 1,000 mg.  Adult women 51 to 70 years: 1,200 mg.  Adult men 51 to 70 years: 1,000 mg.  Adults 71 years and older: 1,200 mg.  Pregnant and breastfeeding teens: 1,300 mg.  Pregnant and breastfeeding adults: 1,000 mg.  What do I need to know about calcium intake?  In order for the body to absorb calcium, it needs vitamin D. You can get vitamin D through: ? Direct exposure of the skin to sunlight. ? Foods, such as egg yolks, liver, saltwater fish, and fortified milk. ? Supplements.  Consuming too much calcium may cause: ? Constipation. ? Decreased absorption of iron and zinc. ? Kidney stones.  Calcium supplements may interact with certain medicines. Check with your health care provider before starting any calcium supplements.  Try to get most of your calcium from food. What foods can I eat? Grains  Fortified oatmeal. Fortified ready-to-eat cereals. Fortified frozen waffles. Vegetables Turnip greens. Broccoli. Fruits Fortified orange juice. Meats and Other Protein Sources Canned sardines with bones. Canned salmon with bones. Soy beans.  Tofu. Baked beans. Almonds. [place] nuts. Sunflower seeds. Dairy Milk. Yogurt. Cheese. Cottage cheese. Beverages Fortified soy milk. Fortified rice milk. Sweets/Desserts Pudding. Ice Cream. Milkshakes. Blackstrap molasses. The items listed above may not be a complete list of recommended foods or beverages. Contact your dietitian for more options. What foods can affect my calcium intake? It may be more  difficult for your body to use calcium or calcium may leave your body more quickly if you consume large amounts of:  Sodium.  Protein.  Caffeine.  Alcohol.  This information is not intended to replace advice given to you by your health care provider. Make sure you discuss any questions you have with your health care provider. Document Released: 01/20/2004 Document Revised: 12/26/2015 Document Reviewed: 11/13/2013 Elsevier Interactive Patient Education  2018 [place] protect organs, store calcium, and anchor muscles. Good health habits, such as eating nutritious foods and exercising regularly, are important for maintaining healthy bones. They can also help to prevent a condition that causes bones to lose density and become weak and brittle (osteoporosis). Why is bone mass important? Bone mass refers to the amount of bone tissue that you have. The higher your bone mass, the stronger your bones. An important step toward having healthy bones throughout life is to have strong and dense bones during childhood. A young adult who has a high bone mass is more likely to have a high bone mass later in life. Bone mass at its greatest it is called peak bone mass. A large decline in bone mass occurs in older adults. In women, it occurs about the time of menopause. During this time, it is important to practice good health habits, because if more bone is lost than what is replaced, the bones will become less healthy and more likely to break (fracture). If you find that you have a low bone mass, you may be able to prevent osteoporosis or further bone loss by changing your diet and lifestyle. How can I find out if my bone mass is low? Bone mass can be measured with an X-ray test that is called a bone mineral density (BMD) test. This test is recommended for all women who are age 4 or older. It may also be recommended for men who are age 53 or older, or for people who are more likely to  develop osteoporosis due to:  Having bones that break easily.  Having a long-term disease that weakens bones, such as kidney disease or rheumatoid arthritis.  Having menopause earlier than normal.  Taking medicine that weakens bones, such as steroids, thyroid hormones, or hormone treatment for breast cancer or prostate cancer.  Smoking.  Drinking three or more alcoholic drinks each day.  What are the nutritional recommendations for healthy bones? To have healthy bones, you need to get enough of the right minerals and vitamins. Most nutrition experts recommend getting these nutrients from the foods that you eat. Nutritional recommendations vary from person to person. Ask your health care provider what is healthy for you. Here are some general guidelines. Calcium Recommendations Calcium is the most important (essential) mineral for bone health. Most people can get enough calcium from their diet, but supplements may be recommended for people who are at risk for osteoporosis. Good sources of calcium include:  Dairy products, such as low-fat or nonfat milk, cheese, and yogurt.  Dark green leafy vegetables, such as bok choy and broccoli.  Calcium-fortified foods, such as orange juice, cereal, bread, soy beverages, and tofu  products.  Nuts, such as almonds.  Follow these recommended amounts for daily calcium intake:  Children, age 32?3: 700 mg.  Children, age 38?8: 1,000 mg.  Children, age 65?13: 1,300 mg.  Teens, age 38?18: 1,300 mg.  Adults, age 57?50: 1,000 mg.  Adults, age 383?70: ? Men: 1,000 mg. ? Women: 1,200 mg.  Adults, age 36 or older: 1,200 mg.  Pregnant and breastfeeding females: ? Teens: 1,300 mg. ? Adults: 1,000 mg.  Vitamin D Recommendations Vitamin D is the most essential vitamin for bone health. It helps the body to absorb calcium. Sunlight stimulates the skin to make vitamin D, so be sure to get enough sunlight. If you live in a cold climate or you do not get  outside often, your health care provider may recommend that you take vitamin D supplements. Good sources of vitamin D in your diet include:  Egg yolks.  Saltwater fish.  Milk and cereal fortified with vitamin D.  Follow these recommended amounts for daily vitamin D intake:  Children and teens, age 67?18: 32 international units.  Adults, age 7 or younger: 400-800 international units.  Adults, age 29 or older: 800-1,000 international units.  Other Nutrients Other nutrients for bone health include:  Phosphorus. This mineral is found in meat, poultry, dairy foods, nuts, and legumes. The recommended daily intake for adult men and adult women is 700 mg.  Magnesium. This mineral is found in seeds, nuts, dark green vegetables, and legumes. The recommended daily intake for adult men is 400?420 mg. For adult women, it is 310?320 mg.  Vitamin K. This vitamin is found in green leafy vegetables. The recommended daily intake is 120 mg for adult men and 90 mg for adult women.  What type of physical activity is best for building and maintaining healthy bones? Weight-bearing and strength-building activities are important for building and maintaining peak bone mass. Weight-bearing activities cause muscles and bones to work against gravity. Strength-building activities increases muscle strength that supports bones. Weight-bearing and muscle-building activities include:  Walking and hiking.  Jogging and running.  Dancing.  Gym exercises.  Lifting weights.  Tennis and racquetball.  Climbing stairs.  Aerobics.  Adults should get at least 30 minutes of moderate physical activity on most days. Children should get at least 60 minutes of moderate physical activity on most days. Ask your health care provide what type of exercise is best for you. Where can I find more information? For more information, check out the following websites:  [place]:  [employer].se  [employer] of Health: http://www.niams.AnonymousEar.fr.asp  This information is not intended to replace advice given to you by your health care provider. Make sure you discuss any questions you have with your health care provider. Document Released: 08/28/2003 Document Revised: 12/26/2015 Document Reviewed: 06/12/2014 Elsevier Interactive Patient Education  [employer].

## 2017-08-09 LAB — COMPREHENSIVE METABOLIC PANEL
ALK PHOS: 107 IU/L (ref 39–117)
ALT: 11 IU/L (ref 0–32)
AST: 18 IU/L (ref 0–40)
Albumin/Globulin Ratio: 1.3 (ref 1.2–2.2)
Albumin: 4.5 g/dL (ref 3.5–4.8)
BILIRUBIN TOTAL: 0.3 mg/dL (ref 0.0–1.2)
BUN/Creatinine Ratio: 17 (ref 12–28)
BUN: 19 mg/dL (ref 8–27)
CHLORIDE: 102 mmol/L (ref 96–106)
CO2: 27 mmol/L (ref 20–29)
Calcium: 9.7 mg/dL (ref 8.7–10.3)
Creatinine, Ser: 1.13 mg/dL — ABNORMAL HIGH (ref 0.57–1.00)
GFR calc Af Amer: 54 mL/min/{1.73_m2} — ABNORMAL LOW (ref >59)
GFR calc non Af Amer: 47 mL/min/{1.73_m2} — ABNORMAL LOW (ref >59)
GLUCOSE: 117 mg/dL — AB (ref 65–99)
Globulin, Total: 3.5 g/dL (ref 1.5–4.5)
Potassium: 4.4 mmol/L (ref 3.5–5.2)
Sodium: 144 mmol/L (ref 134–144)
TOTAL PROTEIN: 8 g/dL (ref 6.0–8.5)

## 2017-08-09 LAB — MICROALBUMIN / CREATININE URINE RATIO
CREATININE, UR: 173 mg/dL
MICROALBUM., U, RANDOM: 15.7 ug/mL
Microalb/Creat Ratio: 9.1 mg/g creat (ref 0.0–30.0)

## 2017-08-09 LAB — LDL CHOLESTEROL, DIRECT: LDL DIRECT: 92 mg/dL (ref 0–99)

## 2017-08-10 ENCOUNTER — Encounter: Payer: Self-pay | Admitting: Radiology

## 2017-08-10 MED ORDER — IRBESARTAN-HYDROCHLOROTHIAZIDE 300-12.5 MG PO TABS: 1.0000 | ORAL_TABLET | Freq: Every day | ORAL | 1 refills | Status: DC

## 2017-08-10 MED ORDER — CLONIDINE HCL 0.1 MG PO TABS: 0.1000 mg | ORAL_TABLET | Freq: Every day | ORAL | 3 refills | Status: DC

## 2017-08-10 MED ORDER — GLIPIZIDE 10 MG PO TABS: 10.0000 mg | ORAL_TABLET | Freq: Two times a day (BID) | ORAL | 1 refills | Status: DC

## 2017-08-10 MED ORDER — SIMVASTATIN 20 MG PO TABS: ORAL_TABLET | ORAL | 3 refills | Status: DC

## 2017-08-18 DIAGNOSIS — R6889 Other general symptoms and signs: Secondary | ICD-10-CM | POA: Diagnosis not present

## 2017-08-31 ENCOUNTER — Telehealth: Payer: Self-pay

## 2017-08-31 NOTE — Telephone Encounter (Signed)
Copied from [place] [id number]. Topic: Inquiry >> Aug 31, 2017 12:53 PM Scherrie Gerlach wrote: Reason for CRM: pt would like to know if OK to come and get a B12.  Pt states it has been a while, she only came 2 times. I do not see any instructions or orders for a B12. Pt would like a call back

## 2017-08-31 NOTE — Telephone Encounter (Signed)
Please advise 

## 2017-09-02 MED ORDER — CYANOCOBALAMIN 1000 MCG/ML IJ SOLN
1000.0000 ug | INTRAMUSCULAR | Status: DC
  Administered 2017-09-09: 1000 ug via INTRAMUSCULAR

## 2017-09-02 NOTE — Telephone Encounter (Signed)
That's fine, ok to come in up to once a month. Order placed.

## 2017-09-02 NOTE — Addendum Note (Signed)
Addended by: Shawnee Knapp on: 09/02/2017 11:28 AM   Modules accepted: Orders

## 2017-09-05 ENCOUNTER — Other Ambulatory Visit: Payer: Self-pay | Admitting: Cardiovascular Disease

## 2017-09-05 DIAGNOSIS — I480 Paroxysmal atrial fibrillation: Secondary | ICD-10-CM

## 2017-09-05 NOTE — Telephone Encounter (Signed)
Phone call to patient, unable to reach.   If patient calls back, please let her know Dr. Brigitte Pulse has put in an order for once a month B12 shots for her. She can come to clinic for a nurse only visit at her convenience.

## 2017-09-09 ENCOUNTER — Ambulatory Visit (INDEPENDENT_AMBULATORY_CARE_PROVIDER_SITE_OTHER): Payer: Medicare HMO | Admitting: Family Medicine

## 2017-09-09 DIAGNOSIS — Z8639 Personal history of other endocrine, nutritional and metabolic disease: Secondary | ICD-10-CM | POA: Diagnosis not present

## 2017-09-09 DIAGNOSIS — E538 Deficiency of other specified B group vitamins: Secondary | ICD-10-CM

## 2017-09-16 NOTE — Progress Notes (Signed)
Nurse visit only.  Did not see provider at this visit.

## 2017-09-22 ENCOUNTER — Other Ambulatory Visit: Payer: Self-pay | Admitting: *Deleted

## 2017-09-22 MED ORDER — RIVAROXABAN 20 MG PO TABS: 20.0000 mg | ORAL_TABLET | Freq: Every day | ORAL | 3 refills | Status: DC

## 2017-09-22 NOTE — Telephone Encounter (Signed)
Xarelto 20mg  paper refill request from [facility] received; pt is 77 yrs old, wt-86.6kg, Crea-1.13 on 08/08/17, last seen by Dr. Johnsie Cancel on 08/01/17, CrCl-56.68ml/min. Will send in refill to requested pharmacy.

## 2017-09-30 ENCOUNTER — Other Ambulatory Visit: Payer: Self-pay

## 2017-09-30 MED ORDER — RIVAROXABAN 20 MG PO TABS: 20.0000 mg | ORAL_TABLET | Freq: Every day | ORAL | 1 refills | Status: DC

## 2017-09-30 NOTE — Telephone Encounter (Signed)
Age 77 years Wt 86.6kg 08/08/2017 Saw Dr Johnsie Cancel on 08/01/2017  07/29/2017 SrCr 1.13 07/29/2017 Hgb 13.2 HCT 40.8 CrCl 56.09 Refill done for Xarelto 20 mg daily as requested

## 2017-10-11 DIAGNOSIS — R6889 Other general symptoms and signs: Secondary | ICD-10-CM | POA: Diagnosis not present

## 2017-11-02 DIAGNOSIS — R6889 Other general symptoms and signs: Secondary | ICD-10-CM | POA: Diagnosis not present

## 2017-11-08 DIAGNOSIS — R6889 Other general symptoms and signs: Secondary | ICD-10-CM | POA: Diagnosis not present

## 2017-11-29 DIAGNOSIS — R6889 Other general symptoms and signs: Secondary | ICD-10-CM | POA: Diagnosis not present

## 2017-11-30 DIAGNOSIS — R6889 Other general symptoms and signs: Secondary | ICD-10-CM | POA: Diagnosis not present

## 2017-12-05 ENCOUNTER — Ambulatory Visit (INDEPENDENT_AMBULATORY_CARE_PROVIDER_SITE_OTHER): Payer: Medicare HMO | Admitting: Family Medicine

## 2017-12-05 ENCOUNTER — Other Ambulatory Visit: Payer: Self-pay

## 2017-12-05 ENCOUNTER — Encounter: Payer: Self-pay | Admitting: Family Medicine

## 2017-12-05 VITALS — BP 126/70 | HR 66 | Temp 97.9°F | Resp 16 | Ht 62.5 in | Wt 191.4 lb

## 2017-12-05 DIAGNOSIS — Z1212 Encounter for screening for malignant neoplasm of rectum: Secondary | ICD-10-CM | POA: Diagnosis not present

## 2017-12-05 DIAGNOSIS — M899 Disorder of bone, unspecified: Secondary | ICD-10-CM | POA: Diagnosis not present

## 2017-12-05 DIAGNOSIS — B356 Tinea cruris: Secondary | ICD-10-CM

## 2017-12-05 DIAGNOSIS — E119 Type 2 diabetes mellitus without complications: Secondary | ICD-10-CM

## 2017-12-05 DIAGNOSIS — Z1211 Encounter for screening for malignant neoplasm of colon: Secondary | ICD-10-CM | POA: Diagnosis not present

## 2017-12-05 DIAGNOSIS — N898 Other specified noninflammatory disorders of vagina: Secondary | ICD-10-CM

## 2017-12-05 DIAGNOSIS — M8580 Other specified disorders of bone density and structure, unspecified site: Secondary | ICD-10-CM

## 2017-12-05 DIAGNOSIS — E782 Mixed hyperlipidemia: Secondary | ICD-10-CM

## 2017-12-05 DIAGNOSIS — Z136 Encounter for screening for cardiovascular disorders: Secondary | ICD-10-CM

## 2017-12-05 DIAGNOSIS — I10 Essential (primary) hypertension: Secondary | ICD-10-CM | POA: Diagnosis not present

## 2017-12-05 DIAGNOSIS — Z1231 Encounter for screening mammogram for malignant neoplasm of breast: Secondary | ICD-10-CM

## 2017-12-05 DIAGNOSIS — R3129 Other microscopic hematuria: Secondary | ICD-10-CM | POA: Diagnosis not present

## 2017-12-05 DIAGNOSIS — Z113 Encounter for screening for infections with a predominantly sexual mode of transmission: Secondary | ICD-10-CM | POA: Diagnosis not present

## 2017-12-05 DIAGNOSIS — Z0000 Encounter for general adult medical examination without abnormal findings: Secondary | ICD-10-CM

## 2017-12-05 DIAGNOSIS — I480 Paroxysmal atrial fibrillation: Secondary | ICD-10-CM | POA: Diagnosis not present

## 2017-12-05 DIAGNOSIS — E2839 Other primary ovarian failure: Secondary | ICD-10-CM

## 2017-12-05 DIAGNOSIS — Z1383 Encounter for screening for respiratory disorder NEC: Secondary | ICD-10-CM

## 2017-12-05 DIAGNOSIS — M949 Disorder of cartilage, unspecified: Secondary | ICD-10-CM

## 2017-12-05 DIAGNOSIS — Z1389 Encounter for screening for other disorder: Secondary | ICD-10-CM

## 2017-12-05 DIAGNOSIS — R911 Solitary pulmonary nodule: Secondary | ICD-10-CM

## 2017-12-05 DIAGNOSIS — K130 Diseases of lips: Secondary | ICD-10-CM

## 2017-12-05 LAB — POCT WET + KOH PREP
TRICH BY WET PREP: ABSENT
YEAST BY KOH: ABSENT
Yeast by wet prep: ABSENT

## 2017-12-05 LAB — POC MICROSCOPIC URINALYSIS (UMFC): MUCUS RE: ABSENT

## 2017-12-05 LAB — POCT URINALYSIS DIP (MANUAL ENTRY)
BILIRUBIN UA: NEGATIVE
Glucose, UA: NEGATIVE mg/dL
Ketones, POC UA: NEGATIVE mg/dL
LEUKOCYTES UA: NEGATIVE
Nitrite, UA: NEGATIVE
PH UA: 7 (ref 5.0–8.0)
PROTEIN UA: NEGATIVE mg/dL
Spec Grav, UA: 1.02 (ref 1.010–1.025)
UROBILINOGEN UA: 0.2 U/dL

## 2017-12-05 LAB — POCT GLYCOSYLATED HEMOGLOBIN (HGB A1C): HEMOGLOBIN A1C: 6.7 % — AB (ref 4.0–5.6)

## 2017-12-05 MED ORDER — ZOSTER VAC RECOMB ADJUVANTED 50 MCG/0.5ML IM SUSR: 0.5000 mL | Freq: Once | INTRAMUSCULAR | 1 refills | Status: AC

## 2017-12-05 MED ORDER — CLOTRIMAZOLE-BETAMETHASONE 1-0.05 % EX CREA: 1.0000 "application " | TOPICAL_CREAM | Freq: Two times a day (BID) | CUTANEOUS | 0 refills | Status: DC

## 2017-12-05 MED ORDER — KETOCONAZOLE 2 % EX CREA: 1.0000 "application " | TOPICAL_CREAM | Freq: Every day | CUTANEOUS | 1 refills | Status: DC

## 2017-12-05 NOTE — Progress Notes (Signed)
Subjective:  By signing my name below, I, [name], attest that this documentation has been prepared under the direction and in the presence of Delman Cheadle, MD Electronically Signed: Ladene Artist, ED Scribe 12/05/2017 at 9:48 AM.   Patient ID: Catherine Joseph, female    DOB: [birth year], 77 y.o.   MRN: [id number]  Chief Complaint  Patient presents with  . Annual Exam    pt states she has an appt next wk for eye exam, will have a copy of report sent over to this ofc  . Medication Refill    pt request Gyne-Lortrimin 3 cream  2%   HPI Catherine Joseph is a 77 y.o. female who presents to Primary Care at [facility] for an annual exam. Pt is fasting at this visit.  Primary Preventative Screenings: Cervical Cancer: s/p hysterectomy for benign indication STI screening: pt declines - is not sexually active Breast Cancer: mammogram 06/2016 at the [facility]: NA due to age Tobacco use/EtOH/substances: None Bone Density: 06/2016 Cardiac: EKG 04/08/2018; nuclear stress test and echo with Dr. Johnsie Cancel 03/22/2014 Weight/Blood sugar/Diet/Exercise: Has 2 light weights that she lifts at home BMI Readings from Last 3 Encounters:  12/05/17 34.45 kg/m  08/08/17 33.83 kg/m  08/01/17 34.23 kg/m   Lab Results  Component Value Date   HGBA1C 6.6 08/08/2017   OTC/Vit/Supp/Herbal: Pt takes calcium supplements, drinks ~1 glass of milk/day, eats yogurt daily. Denies heartburn, indigestion. Dentist/Optho: optho appointment on 6/24 Immunizations: Shingrix: due Immunization History  Administered Date(s) Administered  . Influenza Split 03/31/2012, 02/20/2016  . Influenza, High Dose Seasonal PF 03/06/2016  . Influenza,inj,Quad PF,6+ Mos 02/20/2013, 04/11/2014, 06/09/2015, 03/24/2017  . Pneumococcal Conjugate-13 12/03/2013  . Pneumococcal Polysaccharide-23 05/01/2012  . Td 11/11/2014  . Zoster 06/30/2012   Chronic Medical Conditions: GU Pt reports intermittent minimal vaginal  itching. States this is a recurrent problem. She has tried clotrimazole vaginal cream x 3 days which she states seem to temporarily resolve symptoms for 3-4 days before returning. She has only used vaginal suppositories as she has not been prescribed anything else. Denies vaginal discharge. She is not sexually active. Pt is s/p hysterectomy.  Past Medical History:  Diagnosis Date  . Anemia   . Arthritis   . Bradycardia   . Cataract   . Chronic kidney disease   . Diabetes mellitus type II   . Hypertension   . Osteoporosis   . Syncope   . Weakness    Past Surgical History:  Procedure Laterality Date  . ABDOMINAL HYSTERECTOMY    . CESAREAN SECTION    . COLON SURGERY    . EYE SURGERY    . FRACTURE SURGERY     Current Outpatient Medications on File Prior to Visit  Medication Sig Dispense Refill  . acetaminophen (TYLENOL) 500 MG tablet Take 1 tablet (500 mg total) by mouth every 6 (six) hours as needed. 30 tablet 0  . Alcohol Swabs (B-D SINGLE USE SWABS REGULAR) PADS USE TO CHECK BLOOD SUGAR DAILY  100 each 3  . Blood Glucose Calibration (SURECHEK CONTROL SOLUTION) Normal LIQD Use to check blood sugar daily. Dx E11.65 1 each 3  . blood glucose meter kit and supplies Use to test blood sugar once daily. Dx: E11.65 1 each 0  . Blood Glucose Monitoring Suppl (TRUE METRIX AIR GLUCOSE METER) DEVI 1 each by Does not apply route daily. Use to test blood sugar once daily. Dx: E11.65 1 Device 0  . cloNIDine (CATAPRES) 0.1 MG tablet  Take 1 tablet (0.1 mg total) by mouth at bedtime. 90 tablet 3  . flecainide (TAMBOCOR) 100 MG tablet TAKE 1/2 TABLET (50 MG) BY MOUTH 2 TIMES DAILY 90 tablet 3  . glipiZIDE (GLUCOTROL) 10 MG tablet Take 1 tablet (10 mg total) by mouth 2 (two) times daily before a meal. 180 tablet 1  . irbesartan-hydrochlorothiazide (AVALIDE) 300-12.5 MG tablet Take 1 tablet by mouth daily. 90 tablet 1  . metFORMIN (GLUCOPHAGE-XR) 500 MG 24 hr tablet TAKE 2 TABLETS EVERY DAY WITH  BREAKFAST 180 tablet 1  . PAZEO 0.7 % SOLN Place 1 drop into both eyes daily.  11  . polyethylene glycol powder (GLYCOLAX/MIRALAX) powder Take 17 g by mouth 2 (two) times daily as needed. (Patient taking differently: Take 17 g by mouth 2 (two) times daily as needed for moderate constipation. ) 500 g 1  . rivaroxaban (XARELTO) 20 MG TABS tablet Take 1 tablet (20 mg total) by mouth daily with supper. 90 tablet 1  . simvastatin (ZOCOR) 20 MG tablet TAKE 1 TABLET EVERY DAY  AT  6PM 90 tablet 3  . TRUE METRIX BLOOD GLUCOSE TEST test strip TEST BLOOD SUGAR ONE TIME DAILY 100 each 3  . TRUEPLUS LANCETS 28G MISC USE TO TEST BLOOD SUGAR ONCE DAILY. 100 each 3  . clotrimazole (GYNE-LOTRIMIN 3) 2 % vaginal cream Place 1 Applicatorful at bedtime vaginally. (Patient not taking: Reported on 12/05/2017) 21 g 3  . meclizine (ANTIVERT) 25 MG tablet Take 1 tablet (25 mg total) by mouth 3 (three) times daily as needed for dizziness. (Patient not taking: Reported on 12/05/2017) 90 tablet 2   Current Facility-Administered Medications on File Prior to Visit  Medication Dose Route Frequency Provider Last Rate Last Dose  . cyanocobalamin ((VITAMIN B-12)) injection 1,000 mcg  1,000 mcg Intramuscular Q30 days Shawnee Knapp, MD   1,000 mcg at 09/09/17 1034   Allergies  Allergen Reactions  . Codeine Other (See Comments)    vertigo   Family History  Problem Relation Age of Onset  . Diabetes Sister   . Hyperlipidemia Sister   . Hypertension Sister   . Diabetes Brother   . Hyperlipidemia Brother   . Hypertension Brother    Social History   Socioeconomic History  . Marital status: Widowed    Spouse name: Not on file  . Number of children: Not on file  . Years of education: Not on file  . Highest education level: Not on file  Occupational History  . Not on file  Social Needs  . Financial resource strain: Not on file  . Food insecurity:    Worry: Not on file    Inability: Not on file  . Transportation needs:     Medical: Not on file    Non-medical: Not on file  Tobacco Use  . Smoking status: Never Smoker  . Smokeless tobacco: Never Used  Substance and Sexual Activity  . Alcohol use: No  . Drug use: No  . Sexual activity: Not on file  Lifestyle  . Physical activity:    Days per week: Not on file    Minutes per session: Not on file  . Stress: Not on file  Relationships  . Social connections:    Talks on phone: Not on file    Gets together: Not on file    Attends religious service: Not on file    Active member of club or organization: Not on file    Attends meetings of clubs or  organizations: Not on file    Relationship status: Not on file  Other Topics Concern  . Not on file  Social History Narrative  . Not on file   Depression screen [facility] 2/9 12/05/2017 08/08/2017 05/28/2017 05/21/2017 05/05/2017  Decreased Interest 0 0 0 0 0  Down, Depressed, Hopeless 0 0 0 0 0  PHQ - 2 Joseph 0 0 0 0 0    Review of Systems  Genitourinary: Negative for vaginal discharge.       + Vaginal itching  All other systems reviewed and are negative.     Objective:   Physical Exam  Constitutional: She is oriented to person, place, and time. She appears well-developed and well-nourished. No distress.  HENT:  Head: Normocephalic and atraumatic.  Eyes: Conjunctivae and EOM are normal.  Neck: Neck supple. No tracheal deviation present.  Cardiovascular: Normal rate. An irregularly irregular rhythm present.  Pulmonary/Chest: Effort normal. No respiratory distress.  Poorly defined fibrocystic nodular breasts. No axillary adenopathy.  Abdominal: She exhibits distension (slight). Bowel sounds are increased. There is no hepatosplenomegaly. There is no tenderness.  Slightly tympanitic.  Genitourinary:  Genitourinary Comments: Over perineum and inguinal canals, labia majora, proximal gluteal crease and inner upper thighs, hyper pigmented ashy rash. Well-defined border. Some central clearing. No erythema or satellite  lesions. Unable to do speculum exam due to discomfit but vaginal introitus appeared healthy pink tissue with no visible discharge.  Musculoskeletal: Normal range of motion.  Lymphadenopathy:    She has no axillary adenopathy.  Neurological: She is alert and oriented to person, place, and time.  Skin: Skin is warm and dry.  Psychiatric: She has a normal mood and affect. Her behavior is normal.  Nursing note and vitals reviewed.  BP 126/70 (BP Location: Left Arm, Patient Position: Sitting, Cuff Size: Normal)   Pulse 66   Temp 97.9 F (36.6 C) (Oral)   Resp 16   Ht 5' 2.5" (1.588 m)   Wt 191 lb 6.4 oz (86.8 kg)   SpO2 96%   BMI 34.45 kg/m     Assessment & Plan:  AWV will be scheduled with RN after 4 month f/u with me.  1. Annual physical exam   2. Screening for cardiovascular, respiratory, and genitourinary diseases   3. Routine screening for STI (sexually transmitted infection)   4. Encounter for screening mammogram for breast cancer   5. Screening for colorectal cancer   6. Type 2 diabetes mellitus without complication, without long-term current use of insulin (HCC)   7. Osteopenia, unspecified location   8. Essential hypertension   9. Paroxysmal atrial fibrillation (HCC)   10. Microhematuria   11. Mixed hyperlipidemia   12. Lung nodule   13. Disorder of bone and cartilage   14. Vaginal pruritus   15. Estrogen deficiency   16. Intertrigo labialis   17. Tinea cruris     Orders Placed This Encounter  Procedures  . MM Digital Screening    Standing Status:   Future    Standing Expiration Date:   02/05/2019    Order Specific Question:   Reason for Exam (SYMPTOM  OR DIAGNOSIS REQUIRED)    Answer:   screening    Order Specific Question:   Preferred imaging location?    Answer:   [facility]  . DG Bone Density    Standing Status:   Future    Standing Expiration Date:   06/07/2019    Order Specific Question:   Reason for Exam (SYMPTOM  OR  DIAGNOSIS REQUIRED)     Answer:   f/u osteopenia, estrogen deficiency    Order Specific Question:   Preferred imaging location?    Answer:   [facility]  . Lipid panel    Order Specific Question:   Has the patient fasted?    Answer:   Yes  . CBC with Differential/Platelet  . Comprehensive metabolic panel    Order Specific Question:   Has the patient fasted?    Answer:   Yes  . TSH  . VITAMIN D 25 Hydroxy (Vit-D Deficiency, Fractures)  . POCT glycosylated hemoglobin (Hb A1C)  . POCT urinalysis dipstick  . POCT Microscopic Urinalysis ([facility])  . POCT Wet + KOH Prep    Meds ordered this encounter  Medications  . Zoster Vaccine Adjuvanted [facility]) injection    Sig: Inject 0.5 mLs into the muscle once for 1 dose. Repeat once in 2-6 months    Dispense:  0.5 mL    Refill:  1  . clotrimazole-betamethasone (LOTRISONE) cream    Sig: Apply 1 application topically 2 (two) times daily. X 1 week to perineum    Dispense:  45 g    Refill:  0  . ketoconazole (NIZORAL) 2 % cream    Sig: Apply 1 application topically daily. X 2 weeks minimum to perineum, repeat whenever sx recur    Dispense:  60 g    Refill:  1    I personally performed the services described in this documentation, which was scribed in my presence. The recorded information has been reviewed and considered, and addended by me as needed.   Delman Cheadle, M.D.  Primary Care at [facility] [address] [place], [place] [address] [phone] phone [phone] fax  03/01/18 5:44 PM

## 2017-12-05 NOTE — Patient Instructions (Addendum)
Use the clotrimazole-betamethasone twice daily x 1 week and then change to the ketoconazole once daily for at least 2 weeks minimum and make sure  you have been completely un-itchy for at least several days before stopping.  Repeat the ketoconazole as soon as any symptoms recur.  Apply these creams to the outside of your labia (vaginal lips, between your thighs, between your buttocks, where your thigh creases/hip bends - anywhere you are itchy that is skin.  IF you received an x-ray today, you will receive an invoice from [facility]. Please contact [facility] at [phone] with questions or concerns regarding your invoice.   IF you received labwork today, you will receive an invoice from [place]. Please contact [facility] at [phone] with questions or concerns regarding your invoice.   Our billing staff will not be able to assist you with questions regarding bills from these companies.  You will be contacted with the lab results as soon as they are available. The fastest way to get your results is to activate your My Chart account. Instructions are located on the last page of this paperwork. If you have not heard from [place] regarding the results in 2 weeks, please contact this office.     Intertrigo Intertrigo is skin irritation or inflammation (dermatitis) that occurs when folds of skin rub together. The irritation can cause a rash and make skin raw and itchy. This condition most commonly occurs in the skin folds of these areas:  Toes.  Armpits.  Groin.  Belly.  Breasts.  Buttocks.  Intertrigo is not passed from person to person (is not contagious). What are the causes? This condition is caused by heat, moisture, friction, and lack of air circulation. The condition can be made worse by:  Sweat.  Bacteria or a fungus, such as yeast.  What increases the risk? This condition is more likely to occur if you have moisture in your skin folds. It is also more  likely to develop in people who:  Have diabetes.  Are overweight.  Are on bed rest.  Live in a warm and moist climate.  Wear splints, braces, or other medical devices.  Are not able to control their bowels or bladder (have incontinence).  What are the signs or symptoms? Symptoms of this condition include:  A pink or red skin rash.  Brown patches on the skin.  Raw or scaly skin.  Itchiness.  A burning feeling.  Bleeding.  Leaking fluid.  A bad smell.  How is this diagnosed? This condition is diagnosed with a medical history and physical exam. You may also have a skin swab to test for bacteria or a fungus, such as yeast. How is this treated? Treatment may include:  Cleaning and drying your skin.  An oral antibiotic medicine or antibiotic skin cream for a bacterial infection.  Antifungal cream or pills for an infection that was caused by a fungus, such as yeast.  Steroid ointment to relieve itchiness and irritation.  Follow these instructions at home:  Keep the affected area clean and dry.  Do not scratch your skin.  Stay in a cool environment as much as possible. Use an air conditioner or fan, if available.  Apply over-the-counter and prescription medicines only as told by your health care provider.  If you were prescribed an antibiotic medicine, use it as told by your health care provider. Do not stop using the antibiotic even if your condition improves.  Keep all follow-up visits as told by your health care  provider. This is important. How is this prevented?  Maintain a healthy weight.  Take care of your feet, especially if you have diabetes. Foot care includes: ? Wearing shoes that fit well. ? Keeping your feet dry. ? Wearing clean, breathable socks.  Protect the skin around your groin and buttocks, especially if you have incontinence. Skin protection includes: ? Following a regular cleaning routine. ? Using moisturizers and skin  protectants. ? Changing protection pads frequently.  Do not wear tight clothes. Wear clothes that are loose and absorbent. Wear clothes that are made of cotton.  Wear a bra that gives good support, if needed.  Shower and dry yourself thoroughly after activity. Use a hair dryer on a cool setting to dry between skin folds, especially after you bathe.  If you have diabetes, keep your blood sugar under control. Contact a health care provider if:  Your symptoms do not improve with treatment.  Your symptoms get worse or they spread.  You notice increased redness and warmth.  You have a fever. This information is not intended to replace advice given to you by your health care provider. Make sure you discuss any questions you have with your health care provider. Document Released: 06/07/2005 Document Revised: 11/13/2015 Document Reviewed: 12/09/2014 Elsevier Interactive Patient Education  2018 [employer] Maintenance for Postmenopausal Women Menopause is a normal process in which your reproductive ability comes to an end. This process happens gradually over a span of months to years, usually between the ages of 79 and 45. Menopause is complete when you have missed 12 consecutive menstrual periods. It is important to talk with your health care provider about some of the most common conditions that affect postmenopausal women, such as heart disease, cancer, and bone loss (osteoporosis). Adopting a healthy lifestyle and getting preventive care can help to promote your health and wellness. Those actions can also lower your chances of developing some of these common conditions. What should I know about menopause? During menopause, you may experience a number of symptoms, such as:  Moderate-to-severe hot flashes.  Night sweats.  Decrease in sex drive.  Mood swings.  Headaches.  Tiredness.  Irritability.  Memory problems.  Insomnia.  Choosing to treat or not to treat  menopausal changes is an individual decision that you make with your health care provider. What should I know about hormone replacement therapy and supplements? Hormone therapy products are effective for treating symptoms that are associated with menopause, such as hot flashes and night sweats. Hormone replacement carries certain risks, especially as you become older. If you are thinking about using estrogen or estrogen with progestin treatments, discuss the benefits and risks with your health care provider. What should I know about heart disease and stroke? Heart disease, heart attack, and stroke become more likely as you age. This may be due, in part, to the hormonal changes that your body experiences during menopause. These can affect how your body processes dietary fats, triglycerides, and cholesterol. Heart attack and stroke are both medical emergencies. There are many things that you can do to help prevent heart disease and stroke:  Have your blood pressure checked at least every 1-2 years. High blood pressure causes heart disease and increases the risk of stroke.  If you are 27-38 years old, ask your health care provider if you should take aspirin to prevent a heart attack or a stroke.  Do not use any tobacco products, including cigarettes, chewing tobacco, or electronic cigarettes. If you need help  quitting, ask your health care provider.  It is important to eat a healthy diet and maintain a healthy weight. ? Be sure to include plenty of vegetables, fruits, low-fat dairy products, and lean protein. ? Avoid eating foods that are high in solid fats, added sugars, or salt (sodium).  Get regular exercise. This is one of the most important things that you can do for your health. ? Try to exercise for at least 150 minutes each week. The type of exercise that you do should increase your heart rate and make you sweat. This is known as moderate-intensity exercise. ? Try to do strengthening  exercises at least twice each week. Do these in addition to the moderate-intensity exercise.  Know your numbers.Ask your health care provider to check your cholesterol and your blood glucose. Continue to have your blood tested as directed by your health care provider.  What should I know about cancer screening? There are several types of cancer. Take the following steps to reduce your risk and to catch any cancer development as early as possible. Breast Cancer  Practice breast self-awareness. ? This means understanding how your breasts normally appear and feel. ? It also means doing regular breast self-exams. Let your health care provider know about any changes, no matter how small.  If you are 51 or older, have a clinician do a breast exam (clinical breast exam or CBE) every year. Depending on your age, family history, and medical history, it may be recommended that you also have a yearly breast X-ray (mammogram).  If you have a family history of breast cancer, talk with your health care provider about genetic screening.  If you are at high risk for breast cancer, talk with your health care provider about having an MRI and a mammogram every year.  Breast cancer (BRCA) gene test is recommended for women who have family members with BRCA-related cancers. Results of the assessment will determine the need for genetic counseling and BRCA1 and for BRCA2 testing. BRCA-related cancers include these types: ? Breast. This occurs in males or females. ? Ovarian. ? Tubal. This may also be called fallopian tube cancer. ? Cancer of the abdominal or pelvic lining (peritoneal cancer). ? Prostate. ? Pancreatic.  Cervical, Uterine, and Ovarian Cancer Your health care provider may recommend that you be screened regularly for cancer of the pelvic organs. These include your ovaries, uterus, and vagina. This screening involves a pelvic exam, which includes checking for microscopic changes to the surface of  your cervix (Pap test).  For women ages 21-65, health care providers may recommend a pelvic exam and a Pap test every three years. For women ages 2-65, they may recommend the Pap test and pelvic exam, combined with testing for human papilloma virus (HPV), every five years. Some types of HPV increase your risk of cervical cancer. Testing for HPV may also be done on women of any age who have unclear Pap test results.  Other health care providers may not recommend any screening for nonpregnant women who are considered low risk for pelvic cancer and have no symptoms. Ask your health care provider if a screening pelvic exam is right for you.  If you have had past treatment for cervical cancer or a condition that could lead to cancer, you need Pap tests and screening for cancer for at least 20 years after your treatment. If Pap tests have been discontinued for you, your risk factors (such as having a new sexual partner) need to be reassessed to  determine if you should start having screenings again. Some women have medical problems that increase the chance of getting cervical cancer. In these cases, your health care provider may recommend that you have screening and Pap tests more often.  If you have a family history of uterine cancer or ovarian cancer, talk with your health care provider about genetic screening.  If you have vaginal bleeding after reaching menopause, tell your health care provider.  There are currently no reliable tests available to screen for ovarian cancer.  Lung Cancer Lung cancer screening is recommended for adults 65-76 years old who are at high risk for lung cancer because of a history of smoking. A yearly low-dose CT scan of the lungs is recommended if you:  Currently smoke.  Have a history of at least 30 pack-years of smoking and you currently smoke or have quit within the past 15 years. A pack-year is smoking an average of one pack of cigarettes per day for one year.  Yearly  screening should:  Continue until it has been 15 years since you quit.  Stop if you develop a health problem that would prevent you from having lung cancer treatment.  Colorectal Cancer  This type of cancer can be detected and can often be prevented.  Routine colorectal cancer screening usually begins at age 37 and continues through age 57.  If you have risk factors for colon cancer, your health care provider may recommend that you be screened at an earlier age.  If you have a family history of colorectal cancer, talk with your health care provider about genetic screening.  Your health care provider may also recommend using home test kits to check for hidden blood in your stool.  A small camera at the end of a tube can be used to examine your colon directly (sigmoidoscopy or colonoscopy). This is done to check for the earliest forms of colorectal cancer.  Direct examination of the colon should be repeated every 5-10 years until age 69. However, if early forms of precancerous polyps or small growths are found or if you have a family history or genetic risk for colorectal cancer, you may need to be screened more often.  Skin Cancer  Check your skin from head to toe regularly.  Monitor any moles. Be sure to tell your health care provider: ? About any new moles or changes in moles, especially if there is a change in a mole's shape or color. ? If you have a mole that is larger than the size of a pencil eraser.  If any of your family members has a history of skin cancer, especially at a young age, talk with your health care provider about genetic screening.  Always use sunscreen. Apply sunscreen liberally and repeatedly throughout the day.  Whenever you are outside, protect yourself by wearing long sleeves, pants, a wide-brimmed hat, and sunglasses.  What should I know about osteoporosis? Osteoporosis is a condition in which bone destruction happens more quickly than new bone creation.  After menopause, you may be at an increased risk for osteoporosis. To help prevent osteoporosis or the bone fractures that can happen because of osteoporosis, the following is recommended:  If you are 79-29 years old, get at least 1,000 mg of calcium and at least 600 mg of vitamin D per day.  If you are older than age 31 but younger than age 10, get at least 1,200 mg of calcium and at least 600 mg of vitamin D per day.  If you are older than age 44, get at least 1,200 mg of calcium and at least 800 mg of vitamin D per day.  Smoking and excessive alcohol intake increase the risk of osteoporosis. Eat foods that are rich in calcium and vitamin D, and do weight-bearing exercises several times each week as directed by your health care provider. What should I know about how menopause affects my mental health? Depression may occur at any age, but it is more common as you become older. Common symptoms of depression include:  Low or sad mood.  Changes in sleep patterns.  Changes in appetite or eating patterns.  Feeling an overall lack of motivation or enjoyment of activities that you previously enjoyed.  Frequent crying spells.  Talk with your health care provider if you think that you are experiencing depression. What should I know about immunizations? It is important that you get and maintain your immunizations. These include:  Tetanus, diphtheria, and pertussis (Tdap) booster vaccine.  Influenza every year before the flu season begins.  Pneumonia vaccine.  Shingles vaccine.  Your health care provider may also recommend other immunizations. This information is not intended to replace advice given to you by your health care provider. Make sure you discuss any questions you have with your health care provider. Document Released: 07/30/2005 Document Revised: 12/26/2015 Document Reviewed: 03/11/2015 Elsevier Interactive Patient Education  2018 [employer].

## 2017-12-06 LAB — CBC WITH DIFFERENTIAL/PLATELET
BASOS ABS: 0 10*3/uL (ref 0.0–0.2)
BASOS: 0 %
EOS (ABSOLUTE): 0.1 10*3/uL (ref 0.0–0.4)
Eos: 2 %
Hematocrit: 39.5 % (ref 34.0–46.6)
Hemoglobin: 13.2 g/dL (ref 11.1–15.9)
IMMATURE GRANS (ABS): 0 10*3/uL (ref 0.0–0.1)
Immature Granulocytes: 0 %
Lymphocytes Absolute: 1.8 10*3/uL (ref 0.7–3.1)
Lymphs: 51 %
MCH: 27.9 pg (ref 26.6–33.0)
MCHC: 33.4 g/dL (ref 31.5–35.7)
MCV: 84 fL (ref 79–97)
MONOS ABS: 0.2 10*3/uL (ref 0.1–0.9)
Monocytes: 5 %
Neutrophils Absolute: 1.5 10*3/uL (ref 1.4–7.0)
Neutrophils: 42 %
PLATELETS: 192 10*3/uL (ref 150–450)
RBC: 4.73 x10E6/uL (ref 3.77–5.28)
RDW: 14.8 % (ref 12.3–15.4)
WBC: 3.6 10*3/uL (ref 3.4–10.8)

## 2017-12-06 LAB — COMPREHENSIVE METABOLIC PANEL
ALBUMIN: 4.3 g/dL (ref 3.5–4.8)
ALT: 9 IU/L (ref 0–32)
AST: 14 IU/L (ref 0–40)
Albumin/Globulin Ratio: 1.3 (ref 1.2–2.2)
Alkaline Phosphatase: 82 IU/L (ref 39–117)
BUN / CREAT RATIO: 16 (ref 12–28)
BUN: 17 mg/dL (ref 8–27)
Bilirubin Total: 0.7 mg/dL (ref 0.0–1.2)
CO2: 26 mmol/L (ref 20–29)
CREATININE: 1.06 mg/dL — AB (ref 0.57–1.00)
Calcium: 9.7 mg/dL (ref 8.7–10.3)
Chloride: 102 mmol/L (ref 96–106)
GFR, EST AFRICAN AMERICAN: 58 mL/min/{1.73_m2} — AB (ref >59)
GFR, EST NON AFRICAN AMERICAN: 50 mL/min/{1.73_m2} — AB (ref >59)
GLUCOSE: 126 mg/dL — AB (ref 65–99)
Globulin, Total: 3.4 g/dL (ref 1.5–4.5)
Potassium: 3.9 mmol/L (ref 3.5–5.2)
Sodium: 142 mmol/L (ref 134–144)
TOTAL PROTEIN: 7.7 g/dL (ref 6.0–8.5)

## 2017-12-06 LAB — LIPID PANEL
CHOL/HDL RATIO: 2.7 ratio (ref 0.0–4.4)
Cholesterol, Total: 186 mg/dL (ref 100–199)
HDL: 70 mg/dL (ref >39)
LDL Calculated: 98 mg/dL (ref 0–99)
Triglycerides: 88 mg/dL (ref 0–149)
VLDL Cholesterol Cal: 18 mg/dL (ref 5–40)

## 2017-12-06 LAB — TSH: TSH: 1.73 u[IU]/mL (ref 0.450–4.500)

## 2017-12-06 LAB — VITAMIN D 25 HYDROXY (VIT D DEFICIENCY, FRACTURES): VIT D 25 HYDROXY: 46.1 ng/mL (ref 30.0–100.0)

## 2017-12-12 DIAGNOSIS — Z0100 Encounter for examination of eyes and vision without abnormal findings: Secondary | ICD-10-CM | POA: Diagnosis not present

## 2017-12-12 DIAGNOSIS — E119 Type 2 diabetes mellitus without complications: Secondary | ICD-10-CM | POA: Diagnosis not present

## 2017-12-12 DIAGNOSIS — H3540 Unspecified peripheral retinal degeneration: Secondary | ICD-10-CM | POA: Diagnosis not present

## 2018-01-09 ENCOUNTER — Telehealth: Payer: Self-pay | Admitting: Cardiovascular Disease

## 2018-01-09 NOTE — Telephone Encounter (Signed)
New Message   Patient calling the office for samples of medication:   1.  What medication and dosage are you requesting samples for? rivaroxaban (XARELTO) 20 MG TABS tablet  2.  Are you currently out of this medication? Only has 5 pills

## 2018-01-09 NOTE — Telephone Encounter (Signed)
Called pt. No answer and unable to leave message.

## 2018-01-10 NOTE — Telephone Encounter (Signed)
Patient aware samples are at the front desk for pick up  

## 2018-01-30 ENCOUNTER — Telehealth: Payer: Self-pay | Admitting: Family Medicine

## 2018-01-30 NOTE — Telephone Encounter (Signed)
Copied from [place] [id number]. Topic: Quick Communication - See Telephone Encounter >> Jan 30, 2018  5:27 PM Conception Chancy, NT wrote: CRM for notification. See Telephone encounter for: 01/30/18.  Kathlee Nations is a Arboriculturist from [place], she states the patient is requesting a new meter called [employer]. She states they sent a request on 01/20/18 and have not heard anything back. Please advise.   Cb# [phone]  Reference # [id number]  Fax# [phone]

## 2018-02-02 NOTE — Telephone Encounter (Signed)
Please advise. Dgaddy, CMA 

## 2018-02-02 NOTE — Telephone Encounter (Signed)
[employer] called to check the status on the meter; they havent heard back yet

## 2018-02-04 NOTE — Telephone Encounter (Signed)
Pt is calling back again to check the status of her meter.  Please advise

## 2018-02-14 ENCOUNTER — Telehealth: Payer: Self-pay | Admitting: Family Medicine

## 2018-02-14 NOTE — Telephone Encounter (Signed)
Copied from [place]. Topic: General - Other >> Feb 14, 2018 10:18 AM Yvette Rack wrote: Reason for CRM: Pt called to check the status of the request for a new meter. Pt states she spoke with [facility] and was advised that they had not received anything. Pt is requesting that a Rx for a new meter be sent to [facility]. Cb# [phone]

## 2018-02-16 ENCOUNTER — Other Ambulatory Visit: Payer: Self-pay

## 2018-02-16 DIAGNOSIS — E119 Type 2 diabetes mellitus without complications: Secondary | ICD-10-CM

## 2018-02-16 MED ORDER — BLOOD GLUCOSE METER KIT: PACK | 0 refills | Status: DC

## 2018-02-23 ENCOUNTER — Telehealth: Payer: Self-pay | Admitting: Family Medicine

## 2018-02-23 NOTE — Telephone Encounter (Signed)
Please advise. Dgaddy, CMA 

## 2018-02-23 NOTE — Telephone Encounter (Signed)
Copied from [place] [id number]. Topic: General - Other >> Feb 23, 2018  3:25 PM Yvette Rack wrote: Reason for CRM: Rasheeda with [facility] stated pt is requesting a new meter however they have not received a Rx request for a new meter. Lydia Guiles stated a Rx for a new meter is needed.

## 2018-02-27 ENCOUNTER — Other Ambulatory Visit: Payer: Self-pay

## 2018-02-27 DIAGNOSIS — E119 Type 2 diabetes mellitus without complications: Secondary | ICD-10-CM

## 2018-02-27 MED ORDER — BLOOD GLUCOSE METER KIT: PACK | 0 refills | Status: DC

## 2018-02-27 NOTE — Telephone Encounter (Signed)
RX printed and will be faxed to pharmacy.

## 2018-02-28 NOTE — Telephone Encounter (Signed)
Naomi with [facility] calling and states that they never received the orders for glucose monitor kit. States that they will need a verbal order. CB#: [phone] 

## 2018-03-02 NOTE — Telephone Encounter (Signed)
Duplicate - signed and placed in to fax box at 104

## 2018-03-02 NOTE — Telephone Encounter (Signed)
Prescription called in

## 2018-03-02 NOTE — Telephone Encounter (Signed)
Received form and have signed and placed in "to fax" box at 104.

## 2018-03-03 ENCOUNTER — Telehealth: Payer: Self-pay | Admitting: Family Medicine

## 2018-03-03 NOTE — Telephone Encounter (Signed)
Attempted to call pt to reschedule her appt with Brigitte Pulse.  If she calls back, please reschedule her for either November with Brigitte Pulse or anytime with another provider

## 2018-03-16 ENCOUNTER — Other Ambulatory Visit: Payer: Self-pay | Admitting: Family Medicine

## 2018-03-20 ENCOUNTER — Ambulatory Visit (INDEPENDENT_AMBULATORY_CARE_PROVIDER_SITE_OTHER): Payer: Medicare HMO | Admitting: Family Medicine

## 2018-03-20 ENCOUNTER — Other Ambulatory Visit: Payer: Self-pay

## 2018-03-20 ENCOUNTER — Encounter: Payer: Self-pay | Admitting: Family Medicine

## 2018-03-20 VITALS — BP 124/70 | HR 67 | Temp 98.1°F | Resp 16 | Ht 62.5 in | Wt 194.2 lb

## 2018-03-20 DIAGNOSIS — I10 Essential (primary) hypertension: Secondary | ICD-10-CM | POA: Diagnosis not present

## 2018-03-20 DIAGNOSIS — I480 Paroxysmal atrial fibrillation: Secondary | ICD-10-CM

## 2018-03-20 DIAGNOSIS — E119 Type 2 diabetes mellitus without complications: Secondary | ICD-10-CM | POA: Diagnosis not present

## 2018-03-20 DIAGNOSIS — E782 Mixed hyperlipidemia: Secondary | ICD-10-CM | POA: Diagnosis not present

## 2018-03-20 DIAGNOSIS — Z23 Encounter for immunization: Secondary | ICD-10-CM | POA: Diagnosis not present

## 2018-03-20 MED ORDER — METFORMIN HCL ER 500 MG PO TB24: 1000.0000 mg | ORAL_TABLET | Freq: Every day | ORAL | 1 refills | Status: DC

## 2018-03-20 MED ORDER — GLIPIZIDE 10 MG PO TABS: 10.0000 mg | ORAL_TABLET | Freq: Two times a day (BID) | ORAL | 1 refills | Status: DC

## 2018-03-20 MED ORDER — IRBESARTAN-HYDROCHLOROTHIAZIDE 300-12.5 MG PO TABS: 1.0000 | ORAL_TABLET | Freq: Every day | ORAL | 1 refills | Status: DC

## 2018-03-20 NOTE — Patient Instructions (Signed)
° ° ° °  If you have lab work done today you will be contacted with your lab results within the next 2 weeks.  If you have not heard from us then please contact us. The fastest way to get your results is to register for My Chart. ° ° °IF you received an x-ray today, you will receive an invoice from [facility]. Please contact [facility] at [phone] with questions or concerns regarding your invoice.  ° °IF you received labwork today, you will receive an invoice from [facility]. Please contact [facility] at [phone] with questions or concerns regarding your invoice.  ° °Our billing staff will not be able to assist you with questions regarding bills from these companies. ° °You will be contacted with the lab results as soon as they are available. The fastest way to get your results is to activate your My Chart account. Instructions are located on the last page of this paperwork. If you have not heard from us regarding the results in 2 weeks, please contact this office. °  ° ° ° °

## 2018-03-20 NOTE — Progress Notes (Signed)
9/30/20193:24 PM  Catherine Joseph December 23, 1940, 77 y.o. female [id number]  Chief Complaint  Patient presents with  . Diabetes    4 month f/u    HPI:   Patient is a 77 y.o. female with past medical history significant for HTN, DM2, Afib, HLP, who presents today for routine followup  PCP Dr Brigitte Pulse Last appt in June for [place] from June 2019 reviewed  Brought in her BP cuff from home, not working well, will get new batteries Sees cards in dec Having problems with paying for xeralto, $140 x 3 months  Saw Dr Idolina Primer, eye doctor, in July, states her eyes were ok, did not refer to specialist, f/u in 1 year Checks glucose every morning Fasting today 109 Denies any lows  Overall she is doing well Has no acute concerns today  Lab Results  Component Value Date   HGBA1C 6.7 (A) 12/05/2017   HGBA1C 6.6 08/08/2017   HGBA1C 6.6 05/05/2017   Lab Results  Component Value Date   MICROALBUR 2.0 08/28/2015   [place] 98 12/05/2017   CREATININE 1.06 (H) 12/05/2017    Fall Risk  03/20/2018 12/05/2017 08/08/2017 05/28/2017 05/21/2017  Falls in the past year? No No No No No  Number falls in past yr: - - - - -  Injury with Fall? - - - - -  Comment - - - - -     Depression screen [facility] 2/9 03/20/2018 12/05/2017 08/08/2017  Decreased Interest 0 0 0  Down, Depressed, Hopeless 0 0 0  PHQ - 2 Score 0 0 0    Allergies  Allergen Reactions  . Codeine Other (See Comments)    vertigo    Prior to Admission medications   Medication Sig Start Date End Date Taking? Authorizing Provider  clotrimazole-betamethasone (LOTRISONE) cream Apply 1 application topically 2 (two) times daily. X 1 week to perineum 12/05/17  Yes Shawnee Knapp, MD  acetaminophen (TYLENOL) 500 MG tablet Take 1 tablet (500 mg total) by mouth every 6 (six) hours as needed. 11/19/16   Shawnee Knapp, MD  Alcohol Swabs (B-D SINGLE USE SWABS REGULAR) PADS USE TO CHECK BLOOD SUGAR DAILY  06/03/17   Shawnee Knapp, MD  Blood Glucose Calibration  (SURECHEK CONTROL SOLUTION) Normal LIQD Use to check blood sugar daily. Dx E11.65 09/08/15   Shawnee Knapp, MD  blood glucose meter kit and supplies Use to test blood sugar once daily. Dx: E11.65 02/27/18   Shawnee Knapp, MD  Blood Glucose Monitoring Suppl (TRUE METRIX AIR GLUCOSE METER) DEVI 1 each by Does not apply route daily. Use to test blood sugar once daily. Dx: E11.65 09/09/15   Ivar Drape D, PA  Calcium-Magnesium-Vitamin D (CALCIUM 500 PO) Take 500 mg by mouth daily.    [provider]  cloNIDine (CATAPRES) 0.1 MG tablet Take 1 tablet (0.1 mg total) by mouth at bedtime. 08/10/17   Shawnee Knapp, MD  clotrimazole (GYNE-LOTRIMIN 3) 2 % vaginal cream Place 1 Applicatorful at bedtime vaginally. Patient not taking: Reported on 12/05/2017 05/05/17   Shawnee Knapp, MD  flecainide (TAMBOCOR) 100 MG tablet TAKE 1/2 TABLET (50 MG) BY MOUTH 2 TIMES DAILY 09/06/17   Josue Hector, MD  glipiZIDE (GLUCOTROL) 10 MG tablet Take 1 tablet (10 mg total) by mouth 2 (two) times daily before a meal. 08/10/17   Shawnee Knapp, MD  irbesartan-hydrochlorothiazide (AVALIDE) 300-12.5 MG tablet Take 1 tablet by mouth daily. 08/10/17   Shawnee Knapp,  MD  ketoconazole (NIZORAL) 2 % cream Apply 1 application topically daily. X 2 weeks minimum to perineum, repeat whenever sx recur 12/05/17   Shawnee Knapp, MD  meclizine (ANTIVERT) 25 MG tablet Take 1 tablet (25 mg total) by mouth 3 (three) times daily as needed for dizziness. Patient not taking: Reported on 12/05/2017 08/08/17   Shawnee Knapp, MD  metFORMIN (GLUCOPHAGE-XR) 500 MG 24 hr tablet TAKE 2 TABLETS EVERY DAY WITH BREAKFAST 03/16/18   Shawnee Knapp, MD  PAZEO 0.7 % SOLN Place 1 drop into both eyes daily. 05/24/16   [provider]  polyethylene glycol powder (GLYCOLAX/MIRALAX) powder Take 17 g by mouth 2 (two) times daily as needed. Patient taking differently: Take 17 g by mouth 2 (two) times daily as needed for moderate constipation.  11/19/16   Shawnee Knapp, MD    rivaroxaban (XARELTO) 20 MG TABS tablet Take 1 tablet (20 mg total) by mouth daily with supper. 09/30/17   Josue Hector, MD  simvastatin (ZOCOR) 20 MG tablet TAKE 1 TABLET EVERY DAY  AT  6PM 08/10/17   Shawnee Knapp, MD  TRUE METRIX BLOOD GLUCOSE TEST test strip TEST BLOOD SUGAR ONE TIME DAILY 06/03/17   Shawnee Knapp, MD  TRUEPLUS LANCETS 28G MISC USE TO TEST BLOOD SUGAR ONCE DAILY. 06/03/17   Shawnee Knapp, MD    Past Medical History:  Diagnosis Date  . Anemia   . Arthritis   . Bradycardia   . Cataract   . Chronic kidney disease   . Diabetes mellitus type II   . Hypertension   . Osteoporosis   . Syncope   . Weakness     Past Surgical History:  Procedure Laterality Date  . ABDOMINAL HYSTERECTOMY    . CESAREAN SECTION    . COLON SURGERY    . EYE SURGERY    . FRACTURE SURGERY      Social History   Tobacco Use  . Smoking status: Never Smoker  . Smokeless tobacco: Never Used  Substance Use Topics  . Alcohol use: No    Family History  Problem Relation Age of Onset  . Diabetes Sister   . Hyperlipidemia Sister   . Hypertension Sister   . Diabetes Brother   . Hyperlipidemia Brother   . Hypertension Brother     Review of Systems  Constitutional: Negative for chills and fever.  Respiratory: Negative for cough and shortness of breath.   Cardiovascular: Negative for chest pain, palpitations and leg swelling.  Gastrointestinal: Negative for abdominal pain, nausea and vomiting.     OBJECTIVE:  Blood pressure 124/70, pulse 67, temperature 98.1 F (36.7 C), temperature source Oral, resp. rate 16, height 5' 2.5" (1.588 m), weight 194 lb 3.2 oz (88.1 kg), SpO2 99 %.. Body mass index is 34.95 kg/m.   BP Readings from Last 3 Encounters:  03/20/18 124/70  12/05/17 126/70  08/08/17 132/72    Wt Readings from Last 3 Encounters:  03/20/18 194 lb 3.2 oz (88.1 kg)  12/05/17 191 lb 6.4 oz (86.8 kg)  08/08/17 191 lb (86.6 kg)   Physical Exam  Constitutional: She is  oriented to person, place, and time. She appears well-developed and well-nourished.  HENT:  Head: Normocephalic and atraumatic.  Right Ear: Hearing, tympanic membrane, external ear and ear canal normal.  Left Ear: Hearing, tympanic membrane, external ear and ear canal normal.  Mouth/Throat: Oropharynx is clear and moist.  Eyes: Pupils are equal, round, and reactive to  light. Conjunctivae and EOM are normal.  Neck: Neck supple.  Cardiovascular: Normal rate, regular rhythm and normal heart sounds. Exam reveals no gallop and no friction rub.  No murmur heard. Pulmonary/Chest: Effort normal and breath sounds normal. She has no wheezes. She has no rales.  Musculoskeletal: She exhibits no edema.  Lymphadenopathy:    She has no cervical adenopathy.  Neurological: She is alert and oriented to person, place, and time.  Skin: Skin is warm and dry.  Psychiatric: She has a normal mood and affect.  Nursing note and vitals reviewed.     ASSESSMENT and PLAN  1. Controlled type 2 diabetes mellitus without complication, without long-term current use of insulin (HCC) - glipiZIDE (GLUCOTROL) 10 MG tablet; Take 1 tablet (10 mg total) by mouth 2 (two) times daily before a meal.  2. Essential hypertension - Care order/instruction:  3. Paroxysmal atrial fibrillation (HCC)  4. Mixed hyperlipidemia  5. Need for immunization against influenza   Chronic medical conditions are controlled. meds refilled. Advised speak with cards re cost of xeralto. a1c due at next visit Other orders - Flu vaccine HIGH DOSE PF (Fluzone High dose) - metFORMIN (GLUCOPHAGE-XR) 500 MG 24 hr tablet; Take 2 tablets (1,000 mg total) by mouth daily with breakfast. - irbesartan-hydrochlorothiazide (AVALIDE) 300-12.5 MG tablet; Take 1 tablet by mouth daily.  Return in about 3 months (around 06/19/2018) for chronic medical conditions. Dr Oswaldo Milian, MD Primary Care at [facility] [place], [place]  [id number] Ph.  [phone] Fax [phone]

## 2018-03-28 ENCOUNTER — Telehealth: Payer: Self-pay | Admitting: Family Medicine

## 2018-04-10 ENCOUNTER — Ambulatory Visit: Payer: Medicare HMO

## 2018-04-10 ENCOUNTER — Ambulatory Visit: Payer: Medicare HMO | Admitting: Family Medicine

## 2018-04-13 NOTE — Telephone Encounter (Signed)
none

## 2018-04-28 ENCOUNTER — Telehealth: Payer: Self-pay | Admitting: Cardiovascular Disease

## 2018-04-28 NOTE — Telephone Encounter (Signed)
It is unclear why the pts Xarelto has gone up in cost other than she may be in the "donut hole" all though she does not think she is. I have asked her to contact [employer] to ask why her Xarelto went up in cost and that if she is in the "donut hole" I will help her apply for pt assistance.  She verbalized understanding and states that she will call [employer] to find out why her Xarelto is so expensive and that she will let me know.

## 2018-04-28 NOTE — Telephone Encounter (Signed)
I called the pt back to let her know that I have left her some Xarelto samples for her to pick up in the front office. She verbalized understanding and thanked me for helping her.

## 2018-04-28 NOTE — Telephone Encounter (Signed)
  Pt c/o medication issue:  1. Name of Medication: rivaroxaban (XARELTO) 20 MG TABS tablet  2. How are you currently taking this medication (dosage and times per day)? Take 1 tablet (20 mg total) by mouth daily with supper.  3. Are you having a reaction (difficulty breathing--STAT)?  No  4. What is your medication issue? Patient went to pick up her medicine and was told the price had went up to $212.00 and she cannot afford it. Is there anything else that she can take? She has enough left for about one week.

## 2018-05-04 ENCOUNTER — Other Ambulatory Visit: Payer: Self-pay | Admitting: Family Medicine

## 2018-05-04 NOTE — Telephone Encounter (Signed)
Requested medication (s) are due for refill today: yes  Requested medication (s) are on the active medication list: yes    Last refill: 09/30/17  #90 1 refill  Future visit scheduled yes 06/12/18  Dr. Brigitte Pulse  Notes to clinic:historical Provider         Pt states this med is expensive for 90 day, she prefers a 30 day rx instead.  Requested Prescriptions  Pending Prescriptions Disp Refills   rivaroxaban (XARELTO) 20 MG TABS tablet 90 tablet 1    Sig: Take 1 tablet (20 mg total) by mouth daily with supper.     Hematology: Anticoagulants - rivaroxaban Failed - 05/04/2018  2:03 PM      Failed - Cr in normal range and within 360 days    Creat  Date Value Ref Range Status  11/29/2015 1.03 (H) 0.60 - 0.93 mg/dL Final   Creatinine, Ser  Date Value Ref Range Status  12/05/2017 1.06 (H) 0.57 - 1.00 mg/dL Final         Passed - ALT in normal range and within 180 days    ALT  Date Value Ref Range Status  12/05/2017 9 0 - 32 IU/L Final         Passed - AST in normal range and within 180 days    AST  Date Value Ref Range Status  12/05/2017 14 0 - 40 IU/L Final         Passed - HCT in normal range and within 360 days    Hematocrit  Date Value Ref Range Status  12/05/2017 39.5 34.0 - 46.6 % Final         Passed - HGB in normal range and within 360 days    Hemoglobin  Date Value Ref Range Status  12/05/2017 13.2 11.1 - 15.9 g/dL Final         Passed - PLT in normal range and within 360 days    Platelets  Date Value Ref Range Status  12/05/2017 192 150 - 450 x10E3/uL Final   Platelet Count, POC  Date Value Ref Range Status  03/24/2017 173 142 - 424 K/uL Final         Passed - Valid encounter within last 12 months    Recent Outpatient Visits          1 month ago Controlled type 2 diabetes mellitus without complication, without long-term current use of insulin ([place])   [facility], Lilia Argue, MD   5 months ago Annual physical exam   [facility], Laurey Arrow, MD   7 months ago B12 deficiency   [facility], Laurey Arrow, MD   8 months ago Type 2 diabetes mellitus without complication, without long-term current use of insulin [facility])   [facility], Laurey Arrow, MD   11 months ago Urinary tract infection symptoms   [facility], Laurey Arrow, MD      Future Appointments            In 1 month Brigitte Pulse Laurey Arrow, MD Primary Care at [place], [facility]   In 3 months Josue Hector, MD [place], [facility]

## 2018-05-04 NOTE — Telephone Encounter (Unsigned)
Copied from [place] [id number]. Topic: Quick Communication - Rx Refill/Question >> May 04, 2018  1:08 PM Scherrie Gerlach wrote: Medication: rivaroxaban (XARELTO) 20 MG TABS tablet Pt states this med is so expensive for 90 day, she prefers to get a 30 day rx instead. The 90 day cost her over $200  Pt would like you to send to a 30 day with refills to [facility] [place] [place] [facility] [address] [facility] [address] [phone] (Phone) [phone] (Fax)

## 2018-05-08 NOTE — Telephone Encounter (Signed)
**Note De-Identified Perlie Scheuring Obfuscation** I did a tier exception on the pts Xarelto in hopes of bringing the cost down and because a PA is not required per the pts pharmacy.   We received a letter Heath Badon fax from [facility] stating that they have denied the tier exception on the pts Xarelto. Reason: Under Medicare rules once a brand name medication has been lowered to a tier 3 it cannot be lowered any further.  Because the pt has medication coverage she is not eligible for pt asst.  I called the pt and made her aware. She states that she cannot afford Xarelto at the cost it is now. I recommended to her again that she needs to call her insurance company to see which anticoagulant is the preferred one and that otherwise she will need to go back on Coumadin.  Will forward this message to Dr Johnsie Cancel and his nurse as Juluis Rainier.

## 2018-05-10 MED ORDER — RIVAROXABAN 20 MG PO TABS: 20.0000 mg | ORAL_TABLET | Freq: Every day | ORAL | 0 refills | Status: DC

## 2018-05-10 NOTE — Telephone Encounter (Signed)
Refill request for xarelto 20 mg  #30 with no refills.  Pt needs office visit with Brigitte Pulse.  Will send to scheduling pool to call pt and schedule f/u with Brigitte Pulse. Dgaddy, CMA

## 2018-06-09 ENCOUNTER — Other Ambulatory Visit: Payer: Self-pay | Admitting: Family Medicine

## 2018-06-09 ENCOUNTER — Ambulatory Visit: Payer: Medicare HMO | Admitting: Family Medicine

## 2018-06-12 ENCOUNTER — Other Ambulatory Visit: Payer: Self-pay

## 2018-06-12 ENCOUNTER — Encounter: Payer: Self-pay | Admitting: Family Medicine

## 2018-06-12 ENCOUNTER — Ambulatory Visit (INDEPENDENT_AMBULATORY_CARE_PROVIDER_SITE_OTHER): Payer: Medicare HMO | Admitting: Family Medicine

## 2018-06-12 VITALS — BP 142/75 | HR 75 | Temp 98.0°F | Resp 16 | Ht 63.58 in | Wt 194.0 lb

## 2018-06-12 DIAGNOSIS — E119 Type 2 diabetes mellitus without complications: Secondary | ICD-10-CM

## 2018-06-12 LAB — POCT GLYCOSYLATED HEMOGLOBIN (HGB A1C): HEMOGLOBIN A1C: 6.7 % — AB (ref 4.0–5.6)

## 2018-06-12 NOTE — Patient Instructions (Addendum)
If you have lab work done today you will be contacted with your lab results within the next 2 weeks.  If you have not heard from [place] then please contact us. The fastest way to get your results is to register for My Chart.   IF you received an x-ray today, you will receive an invoice from [facility]. Please contact [facility] at [phone] with questions or concerns regarding your invoice.   IF you received labwork today, you will receive an invoice from [facility]. Please contact [facility] at [phone] with questions or concerns regarding your invoice.   Our billing staff will not be able to assist you with questions regarding bills from these companies.  You will be contacted with the lab results as soon as they are available. The fastest way to get your results is to activate your My Chart account. Instructions are located on the last page of this paperwork. If you have not heard from [place] regarding the results in 2 weeks, please contact this office.     Iliotibial Band Syndrome  Iliotibial band syndrome (ITBS) is a condition that often causes knee pain. It can also cause pain in the outside of your hip, thigh, and knee. The iliotibial band is a strip of tissue that runs from the outside of your hip and down your thigh to the outside of your knee. Repeatedly bending and straightening your knee can irritate the iliotibial band. What are the causes? This condition is caused by inflammation and irritation from the friction of the iliotibial band moving over the thigh bone (femur) when you repeatedly bend and straighten your knee. What increases the risk? This condition is more likely to develop in people who:  Frequently change elevation during their workouts.  Run very long distances.  Recently increased the length or intensity of their workouts.  Run downhill often, or just started running downhill.  Ride a bike very far or often. You may also be at greater risk  if you start a new workout routine without first warming up or if you have a job that requires you to bend, squat, or climb frequently. What are the signs or symptoms? Symptoms of this condition include:  Pain along the outside of your knee that may be worse with activity, especially running or going up and down stairs.  A "snapping" sensation over your knee.  Swelling on the outside of your knee.  Pain or a feeling of tightness in your hip. How is this diagnosed? This condition is diagnosed based on your symptoms, medical history, and physical exam. You may also see a health care provider who specializes in reducing pain and increasing mobility (physical therapist). A physical therapist may do an exam to check your balance, movement, and way of walking or running (gait) to see whether the way you move could contribute to your injury. You may also have tests to measure your strength, flexibility, and range of motion. How is this treated? Treatment for this condition includes:  Resting and limiting exercise.  Returning to activities gradually.  Doing range-of-motion and strengthening exercises (physical therapy) as told by your health care provider.  Including low-impact activities, such as swimming, in your exercise routine. Follow these instructions at home:  If directed, apply ice to the injured area. ? Put ice in a plastic bag. ? Place a towel between your skin and the bag. ? Leave the ice on for 20 minutes, 2-3 times per day.  Return to your normal activities as told  by your health care provider. Ask your health care provider what activities are safe for you.  Keep all follow-up visits with your health care provider. This is important. Contact a health care provider if:  Your pain does not improve or gets worse despite treatment. This information is not intended to replace advice given to you by your health care provider. Make sure you discuss any questions you have with your  health care provider. Document Released: 11/27/2001 Document Revised: 07/09/2016 Document Reviewed: 07/09/2016 Elsevier Interactive Patient Education  2019 [employer].  How to Perform the Epley Maneuver The Epley maneuver is an exercise that relieves symptoms of vertigo. Vertigo is the feeling that you or your surroundings are moving when they are not. When you feel vertigo, you may feel like the room is spinning and have trouble walking. Dizziness is a little different than vertigo. When you are dizzy, you may feel unsteady or light-headed. You can do this maneuver at home whenever you have symptoms of vertigo. You can do it up to 3 times a day until your symptoms go away. Even though the Epley maneuver may relieve your vertigo for a few weeks, it is possible that your symptoms will return. This maneuver relieves vertigo, but it does not relieve dizziness. What are the risks? If it is done correctly, the Epley maneuver is considered safe. Sometimes it can lead to dizziness or nausea that goes away after a short time. If you develop other symptoms, such as changes in vision, weakness, or numbness, stop doing the maneuver and call your health care provider. How to perform the Epley maneuver 1. Sit on the edge of a bed or table with your back straight and your legs extended or hanging over the edge of the bed or table. 2. Turn your head halfway toward the affected ear or side. 3. Lie backward quickly with your head turned until you are lying flat on your back. You may want to position a pillow under your shoulders. 4. Hold this position for 30 seconds. You may experience an attack of vertigo. This is normal. 5. Turn your head to the opposite direction until your unaffected ear is facing the floor. 6. Hold this position for 30 seconds. You may experience an attack of vertigo. This is normal. Hold this position until the vertigo stops. 7. Turn your whole body to the same side as your head. Hold for  another 30 seconds. 8. Sit back up. You can repeat this exercise up to 3 times a day. Follow these instructions at home:  After doing the Epley maneuver, you can return to your normal activities.  Ask your health care provider if there is anything you should do at home to prevent vertigo. He or she may recommend that you: ? Keep your head raised (elevated) with two or more pillows while you sleep. ? Do not sleep on the side of your affected ear. ? Get up slowly from bed. ? Avoid sudden movements during the day. ? Avoid extreme head movement, like looking up or bending over. Contact a health care provider if:  Your vertigo gets worse.  You have other symptoms, including: ? Nausea. ? Vomiting. ? Headache. Get help right away if:  You have vision changes.  You have a severe or worsening headache or neck pain.  You cannot stop vomiting.  You have new numbness or weakness in any part of your body. Summary  Vertigo is the feeling that you or your surroundings are moving when  they are not.  The Epley maneuver is an exercise that relieves symptoms of vertigo.  If the Epley maneuver is done correctly, it is considered safe. You can do it up to 3 times a day. This information is not intended to replace advice given to you by your health care provider. Make sure you discuss any questions you have with your health care provider. Document Released: 06/12/2013 Document Revised: 04/27/2016 Document Reviewed: 04/27/2016 Elsevier Interactive Patient Education  2019 [employer].

## 2018-06-12 NOTE — Progress Notes (Signed)
Subjective:    Patient: Catherine Joseph  DOB: 1940-08-31; 77 y.o.   MRN: [id number]  Chief Complaint  Patient presents with  . Chronic Conditions    follow-up     HPI  Pain in her left lateral hip for this week. No h/o similar, no h/o injuries. Hurts most when going upstairs and walking - which also makes her left knee hurt.  She takes some tylenol for it and her knww which helps a little, no groin pain. She is taking vitamin D '20mg'$  (?).  Seen by Dr. 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   Couldn't afford Xarelto as copay went frin $45 to $70 so [facility] wouldn't fill it - we had sent a 1 mo refill to [facility] which she was unaware of - has not ran out yet so is still taking and she will be able to pick this up. Sees Dr. Johnsie Cancel annually for h/o PAF on flecanide.  She cannot switch back to coumadin because of the frequent monitoring and she does not drive so can't afford taxi   CBGs 118 today fasting - checks daily ranges ~ 108-130 depending on what she has eaten the night before.   Last 2d she couldn't eat and sleep - no appetite and not sure why.  She did a cleanse last week after she had a lot of bloating and did feel that this helped a lot nad relieved some constipation.   BP at night runs 130s-140s. No hypotensive sxs.   Reports she does have a poss h/o vitamin B12 prior and when she was given a B12 injection previously, she feels a lot better.   Medical History Past Medical History:  Diagnosis Date  . Anemia   . Arthritis   . Bradycardia   . Cataract   . Chronic kidney disease   . Diabetes mellitus type II   . Hypertension   . Osteoporosis   . Syncope   . Weakness    Past Surgical History:  Procedure Laterality Date  . ABDOMINAL HYSTERECTOMY    . CESAREAN SECTION    . COLON SURGERY    . EYE SURGERY    . FRACTURE SURGERY     Current Outpatient Medications on File Prior to  Visit  Medication Sig Dispense Refill  . acetaminophen (TYLENOL) 500 MG tablet Take 1 tablet (500 mg total) by mouth every 6 (six) hours as needed. 30 tablet 0  . Alcohol Swabs (B-D SINGLE USE SWABS REGULAR) PADS USE TO CHECK BLOOD SUGAR DAILY  100 each 3  . Blood Glucose Calibration (SURECHEK CONTROL SOLUTION) Normal LIQD Use to check blood sugar daily. Dx E11.65 1 each 3  . blood glucose meter kit and supplies Use to test blood sugar once daily. Dx: E11.65 1 each 0  . Blood Glucose Monitoring Suppl (TRUE METRIX AIR GLUCOSE METER) DEVI 1 each by Does not apply route daily. Use to test blood sugar once daily. Dx: E11.65 1 Device 0  . Calcium-Magnesium-Vitamin D (CALCIUM 500 PO) Take 500 mg by mouth daily.    . cloNIDine (CATAPRES) 0.1 MG tablet Take 1 tablet (0.1 mg total) by mouth at bedtime. 90 tablet 3  . clotrimazole-betamethasone (LOTRISONE) cream Apply 1 application topically 2 (two) times daily. X 1 week to perineum 45 g 0  . flecainide (TAMBOCOR) 100 MG tablet TAKE 1/2 TABLET (50 MG) BY MOUTH 2  TIMES DAILY 90 tablet 3  . glipiZIDE (GLUCOTROL) 10 MG tablet Take 1 tablet (10 mg total) by mouth 2 (two) times daily before a meal. 180 tablet 1  . irbesartan-hydrochlorothiazide (AVALIDE) 300-12.5 MG tablet Take 1 tablet by mouth daily. 90 tablet 1  . ketoconazole (NIZORAL) 2 % cream Apply 1 application topically daily. X 2 weeks minimum to perineum, repeat whenever sx recur 60 g 1  . metFORMIN (GLUCOPHAGE-XR) 500 MG 24 hr tablet Take 2 tablets (1,000 mg total) by mouth daily with breakfast. 180 tablet 1  . PAZEO 0.7 % SOLN Place 1 drop into both eyes daily.  11  . polyethylene glycol powder (GLYCOLAX/MIRALAX) powder Take 17 g by mouth 2 (two) times daily as needed. (Patient taking differently: Take 17 g by mouth 2 (two) times daily as needed for moderate constipation. ) 500 g 1  . simvastatin (ZOCOR) 20 MG tablet TAKE 1 TABLET EVERY DAY  AT  6PM 90 tablet 3  . TRUE METRIX BLOOD GLUCOSE TEST test  strip TEST BLOOD SUGAR ONE TIME DAILY 100 each 3  . TRUEPLUS LANCETS 28G MISC USE TO TEST BLOOD SUGAR ONCE DAILY. 100 each 3  . XARELTO 20 MG TABS tablet TAKE 1 TABLET BY MOUTH DAILY WITH DINNER 30 tablet 0   Current Facility-Administered Medications on File Prior to Visit  Medication Dose Route Frequency Provider Last Rate Last Dose  . cyanocobalamin ((VITAMIN B-12)) injection 1,000 mcg  1,000 mcg Intramuscular Q30 days Shawnee Knapp, MD   1,000 mcg at 09/09/17 1034   Allergies  Allergen Reactions  . Codeine Other (See Comments)    vertigo   Family History  Problem Relation Age of Onset  . Diabetes Sister   . Hyperlipidemia Sister   . Hypertension Sister   . Diabetes Brother   . Hyperlipidemia Brother   . Hypertension Brother    Social History   Socioeconomic History  . Marital status: Widowed    Spouse name: Not on file  . Number of children: Not on file  . Years of education: Not on file  . Highest education level: Not on file  Occupational History  . Not on file  Social Needs  . Financial resource strain: Not on file  . Food insecurity:    Worry: Not on file    Inability: Not on file  . Transportation needs:    Medical: Not on file    Non-medical: Not on file  Tobacco Use  . Smoking status: Never Smoker  . Smokeless tobacco: Never Used  Substance and Sexual Activity  . Alcohol use: No  . Drug use: No  . Sexual activity: Not on file  Lifestyle  . Physical activity:    Days per week: Not on file    Minutes per session: Not on file  . Stress: Not on file  Relationships  . Social connections:    Talks on phone: Not on file    Gets together: Not on file    Attends religious service: Not on file    Active member of club or organization: Not on file    Attends meetings of clubs or organizations: Not on file    Relationship status: Not on file  Other Topics Concern  . Not on file  Social History Narrative  . Not on file   Depression screen [facility] 2/9 06/12/2018  03/20/2018 12/05/2017 08/08/2017 05/28/2017  Decreased Interest 0 0 0 0 0  Down, Depressed, Hopeless 0 0 0 0 0  PHQ - 2 Score 0 0 0 0 0    ROS As noted in HPI  Objective:  BP (!) 142/75   Pulse 75   Temp 98 F (36.7 C) (Oral)   Resp 16   Ht 5' 3.58" (1.615 m)   Wt 194 lb (88 kg)   SpO2 98%   BMI 33.74 kg/m  Physical Exam Constitutional:      General: She is not in acute distress.    Appearance: She is well-developed. She is not diaphoretic.  HENT:     Head: Normocephalic and atraumatic.     Right Ear: External ear normal.     Left Ear: External ear normal.  Eyes:     General: No scleral icterus.    Conjunctiva/sclera: Conjunctivae normal.  Neck:     Musculoskeletal: Normal range of motion and neck supple.     Thyroid: No thyromegaly.  Cardiovascular:     Rate and Rhythm: Normal rate and regular rhythm.     Heart sounds: Normal heart sounds.  Pulmonary:     Effort: Pulmonary effort is normal. No respiratory distress.     Breath sounds: Normal breath sounds.  Lymphadenopathy:     Cervical: No cervical adenopathy.  Skin:    General: Skin is warm and dry.     Findings: No erythema.  Neurological:     Mental Status: She is alert and oriented to person, place, and time.  Psychiatric:        Behavior: Behavior normal.     [facility] Office Visit on 06/12/2018  Component Date Value Ref Range Status  . Hemoglobin A1C 06/12/2018 6.7* 4.0 - 5.6 % Final     Assessment & Plan:   1. 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  Patient will continue on current chronic medications other than changes noted above, so ok to refill when needed.   See after visit summary for patient specific instructions.  Orders Placed This Encounter  Procedures    . Comprehensive metabolic panel  . Microalbumin / creatinine urine ratio  . POCT glycosylated hemoglobin (Hb A1C)    No orders of the defined types were placed in this encounter.  Notes that the vertigo is worse when she sleeps on her right only  Patient verbalized to me that they understand the following: diagnosis, what is being done for them, what to expect and what should be done at home.  Their questions have been answered. They understand that I am unable to predict every possible medication interaction or adverse outcome and that if any unexpected symptoms arise, they should contact us and their pharmacist, as well as never hesitate to seek urgent/emergent care at [facility] or ER if they think it might be warranted.    Delman Cheadle, MD, MPH [facility] [address] [place], [place]  [phone] Office phone  [phone] Office fax  06/12/18 3:40 PM

## 2018-06-13 LAB — COMPREHENSIVE METABOLIC PANEL
ALT: 9 IU/L (ref 0–32)
AST: 15 IU/L (ref 0–40)
Albumin/Globulin Ratio: 1.3 (ref 1.2–2.2)
Albumin: 4 g/dL (ref 3.5–4.8)
Alkaline Phosphatase: 79 IU/L (ref 39–117)
BUN/Creatinine Ratio: 15 (ref 12–28)
BUN: 15 mg/dL (ref 8–27)
Bilirubin Total: 0.5 mg/dL (ref 0.0–1.2)
CO2: 26 mmol/L (ref 20–29)
Calcium: 9.5 mg/dL (ref 8.7–10.3)
Chloride: 101 mmol/L (ref 96–106)
Creatinine, Ser: 1.02 mg/dL — ABNORMAL HIGH (ref 0.57–1.00)
GFR calc Af Amer: 60 mL/min/{1.73_m2} (ref >59)
GFR calc non Af Amer: 52 mL/min/{1.73_m2} — ABNORMAL LOW (ref >59)
Globulin, Total: 3.1 g/dL (ref 1.5–4.5)
Glucose: 165 mg/dL — ABNORMAL HIGH (ref 65–99)
Potassium: 3.9 mmol/L (ref 3.5–5.2)
Sodium: 143 mmol/L (ref 134–144)
Total Protein: 7.1 g/dL (ref 6.0–8.5)

## 2018-06-13 LAB — MICROALBUMIN / CREATININE URINE RATIO
Creatinine, Urine: 264.1 mg/dL
Microalb/Creat Ratio: 13.3 mg/g creat (ref 0.0–30.0)
Microalbumin, Urine: 35 ug/mL

## 2018-07-26 ENCOUNTER — Other Ambulatory Visit: Payer: Self-pay | Admitting: Family Medicine

## 2018-07-26 NOTE — Progress Notes (Signed)
Patient ID: Catherine Joseph, female   DOB: 1941/04/02, 78 y.o.   MRN: [id number]      77 y.o.  [place] female history of PAF on flecainide. Has been on coumadin and now xarelto for anticoagulation History of atypical chest pain with normal stress echo 2013 and normal myovue 03/21/14   Travels to [place] for extended periods  Lives with daughter/husband and 3 grand children ages 19, 38 and 70  No palpitations dyspnea or chest pain No bleeding issues Dr Brigitte Pulse checks her lab work   Has some chronic vertigo and BPPV  She is concerned that her med prices on xarelto and flecainide have gone up  She is widowed Living with her daughter and 4 grandchildren 3 sisters passed away They lived in [place] She is originally from [place]     ROS: Denies fever, malais, weight loss, blurry vision, decreased visual acuity, cough, sputum, SOB, hemoptysis, pleuritic pain, palpitaitons, heartburn, abdominal pain, melena, lower extremity edema, claudication, or rash.  All other systems reviewed and negative  General: BP 132/88   Pulse 69   Ht '5\' 3"'$  (1.6 m)   Wt 189 lb 6.4 oz (85.9 kg)   SpO2 97%   BMI 33.55 kg/m  Affect appropriate Healthy:  appears stated age 2: normal Neck supple with no adenopathy JVP normal no bruits no thyromegaly Lungs clear with no wheezing and good diaphragmatic motion Heart:  S1/S2 no murmur, no rub, gallop or click PMI normal Abdomen: benighn, BS positve, no tenderness, no AAA no bruit.  No HSM or HJR Distal pulses intact with no bruits No edema Neuro non-focal Skin warm and dry No muscular weakness    Current Outpatient Medications  Medication Sig Dispense Refill  . acetaminophen (TYLENOL) 500 MG tablet Take 1 tablet (500 mg total) by mouth every 6 (six) hours as needed. 30 tablet 0  . Alcohol Swabs (B-D SINGLE USE SWABS REGULAR) PADS USE TO CHECK BLOOD SUGAR DAILY  100 each 3  . Blood Glucose Calibration (SURECHEK CONTROL SOLUTION) Normal LIQD Use to  check blood sugar daily. Dx E11.65 1 each 3  . blood glucose meter kit and supplies Use to test blood sugar once daily. Dx: E11.65 1 each 0  . Blood Glucose Monitoring Suppl (TRUE METRIX AIR GLUCOSE METER) DEVI 1 each by Does not apply route daily. Use to test blood sugar once daily. Dx: E11.65 1 Device 0  . Calcium-Magnesium-Vitamin D (CALCIUM 500 PO) Take 500 mg by mouth daily.    . cloNIDine (CATAPRES) 0.1 MG tablet Take 1 tablet (0.1 mg total) by mouth at bedtime. 90 tablet 3  . clotrimazole-betamethasone (LOTRISONE) cream Apply 1 application topically 2 (two) times daily. X 1 week to perineum 45 g 0  . flecainide (TAMBOCOR) 100 MG tablet TAKE 1/2 TABLET (50 MG) BY MOUTH 2 TIMES DAILY 90 tablet 3  . glipiZIDE (GLUCOTROL) 10 MG tablet Take 1 tablet (10 mg total) by mouth 2 (two) times daily before a meal. 180 tablet 1  . irbesartan-hydrochlorothiazide (AVALIDE) 300-12.5 MG tablet Take 1 tablet by mouth daily. 90 tablet 1  . ketoconazole (NIZORAL) 2 % cream Apply 1 application topically daily. X 2 weeks minimum to perineum, repeat whenever sx recur 60 g 1  . metFORMIN (GLUCOPHAGE-XR) 500 MG 24 hr tablet Take 2 tablets (1,000 mg total) by mouth daily with breakfast. 180 tablet 1  . PAZEO 0.7 % SOLN Place 1 drop into both eyes daily.  11  . polyethylene glycol powder (  GLYCOLAX/MIRALAX) powder Take 17 g by mouth 2 (two) times daily as needed. (Patient taking differently: Take 17 g by mouth 2 (two) times daily as needed for moderate constipation. ) 500 g 1  . simvastatin (ZOCOR) 20 MG tablet TAKE 1 TABLET EVERY DAY  AT  6PM 90 tablet 3  . TRUE METRIX BLOOD GLUCOSE TEST test strip TEST BLOOD SUGAR ONE TIME DAILY 100 each 3  . TRUEPLUS LANCETS 28G MISC USE TO TEST BLOOD SUGAR ONCE DAILY. 100 each 3  . XARELTO 20 MG TABS tablet TAKE 1 TABLET BY MOUTH DAILY WITH DINNER 30 tablet 0   Current Facility-Administered Medications  Medication Dose Route Frequency Provider Last Rate Last Dose  . cyanocobalamin  ((VITAMIN B-12)) injection 1,000 mcg  1,000 mcg Intramuscular Q30 days Shawnee Knapp, MD   1,000 mcg at 09/09/17 1034    Allergies  Codeine  Electrocardiogram:   08/02/18 SR rate 69 PAC otherwise normal   Assessment and Plan  PAF: maintains NSR on flecainide Anticoagulation with xarelto although there have been some issues affording  HTN:  Well controlled.  Continue current medications and low sodium Dash type diet.   Chol:   Cholesterol is at goal.  Continue current dose of statin and diet Rx.  No myalgias or side effects.  F/U  LFT's in 6 months. Lab Results  Component Value Date   LDLCALC 98 12/05/2017   DM:  Discussed low carb diet.  Target hemoglobin A1c is 6.5 or less.  Continue current medications. Lab Results  Component Value Date   HGBA1C 6.7 (A) 06/12/2018    F/U with me in a year   Jenkins Rouge

## 2018-07-26 NOTE — Telephone Encounter (Signed)
Requested Prescriptions  Pending Prescriptions Disp Refills  . XARELTO 20 MG TABS tablet [Pharmacy Med Name: XARELTO 20MG  TABLETS] 30 tablet 0    Sig: TAKE 1 TABLET BY MOUTH DAILY WITH DINNER     Hematology: Anticoagulants - rivaroxaban Failed - 07/26/2018  4:18 PM      Failed - Cr in normal range and within 360 days    Creat  Date Value Ref Range Status  11/29/2015 1.03 (H) 0.60 - 0.93 mg/dL Final   Creatinine, Ser  Date Value Ref Range Status  06/12/2018 1.02 (H) 0.57 - 1.00 mg/dL Final         Passed - ALT in normal range and within 180 days    ALT  Date Value Ref Range Status  06/12/2018 9 0 - 32 IU/L Final         Passed - AST in normal range and within 180 days    AST  Date Value Ref Range Status  06/12/2018 15 0 - 40 IU/L Final         Passed - HCT in normal range and within 360 days    Hematocrit  Date Value Ref Range Status  12/05/2017 39.5 34.0 - 46.6 % Final         Passed - HGB in normal range and within 360 days    Hemoglobin  Date Value Ref Range Status  12/05/2017 13.2 11.1 - 15.9 g/dL Final         Passed - PLT in normal range and within 360 days    Platelets  Date Value Ref Range Status  12/05/2017 192 150 - 450 x10E3/uL Final   Platelet Count, POC  Date Value Ref Range Status  03/24/2017 173 142 - 424 K/uL Final         Passed - Valid encounter within last 12 months    Recent Outpatient Visits          1 month ago Type 2 diabetes mellitus without complication, without long-term current use of insulin ([facility])   [facility], Laurey Arrow, MD   4 months ago Controlled type 2 diabetes mellitus without complication, without long-term current use of insulin [facility])   [facility], Lilia Argue, MD   7 months ago Annual physical exam   [facility], Laurey Arrow, MD   10 months ago B12 deficiency   [facility], Laurey Arrow, MD   11 months ago Type 2 diabetes mellitus without complication, without long-term  current use of insulin [facility])   [facility], Laurey Arrow, MD      Future Appointments            In 1 week Josue Hector, MD [place], [facility]

## 2018-08-02 ENCOUNTER — Encounter: Payer: Self-pay | Admitting: Cardiovascular Disease

## 2018-08-02 ENCOUNTER — Ambulatory Visit: Payer: Medicare HMO | Admitting: Cardiovascular Disease

## 2018-08-02 VITALS — BP 132/88 | HR 69 | Ht 63.0 in | Wt 189.4 lb

## 2018-08-02 DIAGNOSIS — I480 Paroxysmal atrial fibrillation: Secondary | ICD-10-CM

## 2018-08-02 DIAGNOSIS — R6889 Other general symptoms and signs: Secondary | ICD-10-CM | POA: Diagnosis not present

## 2018-08-02 MED ORDER — RIVAROXABAN 20 MG PO TABS: 20.0000 mg | ORAL_TABLET | Freq: Every day | ORAL | 3 refills | Status: DC

## 2018-08-02 NOTE — Patient Instructions (Signed)

## 2018-08-02 NOTE — Addendum Note (Signed)
Addended by: Aris Georgia, Marlissa Emerick L on: 08/02/2018 03:14 PM   Modules accepted: Orders

## 2018-08-28 ENCOUNTER — Telehealth: Payer: Self-pay | Admitting: Family Medicine

## 2018-08-28 NOTE — Telephone Encounter (Signed)
Left a VM in regards to a CRM about making Dr. Pamella Pert her primary care physician. I changed it in her chart and told her she can make appts with Dr. Pamella Pert when needed. If she has any questions, she can call us back at [phone].

## 2018-08-29 ENCOUNTER — Other Ambulatory Visit: Payer: Self-pay | Admitting: Family Medicine

## 2018-08-30 ENCOUNTER — Other Ambulatory Visit: Payer: Self-pay | Admitting: Family Medicine

## 2018-08-30 ENCOUNTER — Other Ambulatory Visit: Payer: Self-pay | Admitting: Cardiovascular Disease

## 2018-08-30 NOTE — Telephone Encounter (Addendum)
Xarelto 20mg  refill request received from [facility]; pt is 78 yrs old, wt-85.9kg, Crea-1.02, on 06/12/2018, last seen by Dr. Johnsie Cancel on 08/02/2018, CrCl-60.39ml/min ;refill was sent on 08/02/2018 to [facility] for a year supply. Called pt to clarify where she needs the RX sent and she states she is having trouble getting it through the mail order and prefers [facility]. Will send to local [facility] per pt request.

## 2018-08-31 ENCOUNTER — Other Ambulatory Visit: Payer: Self-pay

## 2018-08-31 MED ORDER — RIVAROXABAN 20 MG PO TABS: 20.0000 mg | ORAL_TABLET | Freq: Every day | ORAL | 5 refills | Status: DC

## 2018-08-31 NOTE — Telephone Encounter (Signed)
Scr 1.02 on 06/12/18 Last ov 08/02/2018 crcl 60 ml/min Weight 189lb on 08/02/18

## 2018-10-13 ENCOUNTER — Telehealth: Payer: Self-pay | Admitting: Family Medicine

## 2018-10-13 ENCOUNTER — Other Ambulatory Visit: Payer: Self-pay | Admitting: Cardiovascular Disease

## 2018-10-13 DIAGNOSIS — I480 Paroxysmal atrial fibrillation: Secondary | ICD-10-CM

## 2018-10-13 DIAGNOSIS — E785 Hyperlipidemia, unspecified: Secondary | ICD-10-CM

## 2018-10-24 NOTE — Telephone Encounter (Signed)
Pt can discuses medication refills with provider tomorrow at her tele-med appointment.

## 2018-10-24 NOTE — Telephone Encounter (Signed)
Patient is calling in stating she is still waiting for the medication to be refilled. States pharmacy has been unable to get in contact with practice.

## 2018-10-27 ENCOUNTER — Other Ambulatory Visit: Payer: Self-pay

## 2018-10-27 ENCOUNTER — Telehealth (INDEPENDENT_AMBULATORY_CARE_PROVIDER_SITE_OTHER): Payer: Medicare HMO | Admitting: Family Medicine

## 2018-10-27 DIAGNOSIS — I10 Essential (primary) hypertension: Secondary | ICD-10-CM

## 2018-10-27 DIAGNOSIS — I480 Paroxysmal atrial fibrillation: Secondary | ICD-10-CM | POA: Diagnosis not present

## 2018-10-27 DIAGNOSIS — B3789 Other sites of candidiasis: Secondary | ICD-10-CM

## 2018-10-27 DIAGNOSIS — E785 Hyperlipidemia, unspecified: Secondary | ICD-10-CM

## 2018-10-27 DIAGNOSIS — E119 Type 2 diabetes mellitus without complications: Secondary | ICD-10-CM

## 2018-10-27 DIAGNOSIS — Z7901 Long term (current) use of anticoagulants: Secondary | ICD-10-CM

## 2018-10-27 MED ORDER — IRBESARTAN-HYDROCHLOROTHIAZIDE 300-12.5 MG PO TABS: 1.0000 | ORAL_TABLET | Freq: Every day | ORAL | 1 refills | Status: DC

## 2018-10-27 MED ORDER — SIMVASTATIN 20 MG PO TABS: ORAL_TABLET | ORAL | 3 refills | Status: DC

## 2018-10-27 MED ORDER — CLONIDINE HCL 0.1 MG PO TABS: 0.1000 mg | ORAL_TABLET | Freq: Every day | ORAL | 1 refills | Status: DC

## 2018-10-27 MED ORDER — NYSTATIN 100000 UNIT/GM EX CREA: 1.0000 "application " | TOPICAL_CREAM | Freq: Two times a day (BID) | CUTANEOUS | 2 refills | Status: DC

## 2018-10-27 MED ORDER — METFORMIN HCL ER 500 MG PO TB24: 1000.0000 mg | ORAL_TABLET | Freq: Every day | ORAL | 1 refills | Status: DC

## 2018-10-27 MED ORDER — GLIPIZIDE 10 MG PO TABS: 10.0000 mg | ORAL_TABLET | Freq: Two times a day (BID) | ORAL | 1 refills | Status: DC

## 2018-10-27 NOTE — Progress Notes (Signed)
Follow up on diabetes and bp. Says she monitors her bp and the numbers have been high. Last nights reading was 147/90 and 161/90. She says she is sitting upright with both feet on the floor when she takes her bp. Also having pain on the right side of the body, does not know the onset. Denies falling. Taking tylenol for the pain. Needs refills on pended meds, labs have been pending

## 2018-10-27 NOTE — Progress Notes (Signed)
Virtual Visit Note  I connected with patient on 10/27/18 at 1201 by phone and verified that I am speaking with the correct person using two identifiers. Catherine Joseph is currently located at home and patient is currently with them during visit. The provider, Rutherford Guys, MD is located in their office at time of visit.  I discussed the limitations, risks, security and privacy concerns of performing an evaluation and management service by telephone and the availability of in person appointments. I also discussed with the patient that there may be a patient responsible charge related to this service. The patient expressed understanding and agreed to proceed.   CC: routine followup  HPI ? Patient is a 78 y.o. female with past medical history significant for HTN, DM2, Afib, HLP, who presents today for routine followup  Last OV with Dr Brigitte Pulse Dec 2019 Last OV with Dr Johnsie Cancel Feb 2020  She reports she is overall doing well Gets xeralto [facility] monthly which is more affordable, every thing else goes to [place] for 90 days She checks cbgs at home, fasting, today 129 Lowest 85. This happens infrequently. Feels weak at this range. Highest 140. This happens infrequently. Depending if she eats sweets late at night Takes metformin and glipizide, denies any side effects  Checks BP every night,  Last night 146/90, recently 161/90 This has been since she ran out of clonidine, needs refills  Reports stable weight Denies any fever or chills Denies any hearing or vision changes Denies any cough or SOB Denies any cp, palpitations, edema Denies any nausea, vomiting, abd pain, constipation or diarrhea Denies any increased thirst or urination Denies any numbness or tingling in her feet or headaches Denies intermittent insomnia, has some anxiety around world events, sp home country  Lives with her daughter and daughter's family  Continues to have right sided abd pain, around her waist, this  has been going on long time, when this started she was diagnosed with an abscess Intermittent pain, takes tylenol as needed, works for well  Having groin itchy rash   Lab Results  Component Value Date   HGBA1C 6.7 (A) 06/12/2018   HGBA1C 6.7 (A) 12/05/2017   HGBA1C 6.6 08/08/2017   Lab Results  Component Value Date   MICROALBUR 2.0 08/28/2015   [place] 12/05/2017   CREATININE 1.02 (H) 06/12/2018    Allergies  Allergen Reactions  . Codeine Other (See Comments)    vertigo    Prior to Admission medications   Medication Sig Start Date End Date Taking? Authorizing Provider  acetaminophen (TYLENOL) 500 MG tablet Take 1 tablet (500 mg total) by mouth every 6 (six) hours as needed. 11/19/16   Shawnee Knapp, MD  Alcohol Swabs (B-D SINGLE USE SWABS REGULAR) PADS USE TO CHECK BLOOD SUGAR DAILY  06/03/17   Shawnee Knapp, MD  Blood Glucose Calibration (SURECHEK CONTROL SOLUTION) Normal LIQD Use to check blood sugar daily. Dx E11.65 09/08/15   Shawnee Knapp, MD  blood glucose meter kit and supplies Use to test blood sugar once daily. Dx: E11.65 02/27/18   Shawnee Knapp, MD  Blood Glucose Monitoring Suppl (TRUE METRIX AIR GLUCOSE METER) DEVI 1 each by Does not apply route daily. Use to test blood sugar once daily. Dx: E11.65 09/09/15   Ivar Drape D, PA  Calcium-Magnesium-Vitamin D (CALCIUM 500 PO) Take 500 mg by mouth daily.    [provider]  cloNIDine (CATAPRES) 0.1 MG tablet Take 1 tablet (0.1 mg total)  by mouth at bedtime. 08/10/17   Shawnee Knapp, MD  clotrimazole-betamethasone (LOTRISONE) cream Apply 1 application topically 2 (two) times daily. X 1 week to perineum 12/05/17   Shawnee Knapp, MD  flecainide (TAMBOCOR) 100 MG tablet TAKE 1/2 TABLET (50 MG) BY MOUTH 2 TIMES DAILY 10/13/18   Josue Hector, MD  glipiZIDE (GLUCOTROL) 10 MG tablet Take 1 tablet (10 mg total) by mouth 2 (two) times daily before a meal. 03/20/18   Rutherford Guys, MD  irbesartan-hydrochlorothiazide (AVALIDE)  300-12.5 MG tablet Take 1 tablet by mouth daily. 03/20/18   Rutherford Guys, MD  ketoconazole (NIZORAL) 2 % cream Apply 1 application topically daily. X 2 weeks minimum to perineum, repeat whenever sx recur 12/05/17   Shawnee Knapp, MD  metFORMIN (GLUCOPHAGE-XR) 500 MG 24 hr tablet Take 2 tablets (1,000 mg total) by mouth daily with breakfast. 03/20/18   Rutherford Guys, MD  PAZEO 0.7 % SOLN Place 1 drop into both eyes daily. 05/24/16   [provider]  polyethylene glycol powder (GLYCOLAX/MIRALAX) powder Take 17 g by mouth 2 (two) times daily as needed. Patient taking differently: Take 17 g by mouth 2 (two) times daily as needed for moderate constipation.  11/19/16   Shawnee Knapp, MD  rivaroxaban (XARELTO) 20 MG TABS tablet Take 1 tablet (20 mg total) by mouth daily with supper. 08/31/18   Josue Hector, MD  simvastatin (ZOCOR) 20 MG tablet TAKE 1 TABLET EVERY DAY  AT  6PM 08/10/17   Shawnee Knapp, MD  TRUE METRIX BLOOD GLUCOSE TEST test strip TEST BLOOD SUGAR ONE TIME DAILY 06/03/17   Shawnee Knapp, MD  TRUEPLUS LANCETS 28G MISC USE TO TEST BLOOD SUGAR ONCE DAILY. 06/03/17   Shawnee Knapp, MD    Past Medical History:  Diagnosis Date  . Anemia   . Arthritis   . Bradycardia   . Cataract   . Chronic kidney disease   . Diabetes mellitus type II   . Hypertension   . Osteoporosis   . Syncope   . Weakness     Past Surgical History:  Procedure Laterality Date  . ABDOMINAL HYSTERECTOMY    . CESAREAN SECTION    . COLON SURGERY    . EYE SURGERY    . FRACTURE SURGERY      Social History   Tobacco Use  . Smoking status: Never Smoker  . Smokeless tobacco: Never Used  Substance Use Topics  . Alcohol use: No    Family History  Problem Relation Age of Onset  . Diabetes Sister   . Hyperlipidemia Sister   . Hypertension Sister   . Diabetes Brother   . Hyperlipidemia Brother   . Hypertension Brother     ROS Per hpi  Objective  Vitals as reported by the patient: none    ASSESSMENT and PLAN  1. Type 2 diabetes mellitus without complication, without long-term current use of insulin (HCC) Last a1c at goal. Reported fasting cbgs at goal. Tighter control of less then 6.5 concerning in a 77yo due to increased risk of hypoglycemia. Pending labs, meds will be adjusted as needed. - Lipid panel; Future - TSH; Future - Hemoglobin A1c; Future - CMP14+EGFR; Future - glipiZIDE (GLUCOTROL) 10 MG tablet; Take 1 tablet (10 mg total) by mouth 2 (two) times daily before a meal.  2. Essential hypertension Not controlled in setting of being off clonidine. Refilling medications. Continue home BP monitoring. - Lipid panel; Future -  TSH; Future - CMP14+EGFR; Future  3. Paroxysmal atrial fibrillation (HCC) 4. Current use of long term anticoagulation On flecainide and xeralto per cards, sees them yearly. - CBC; Future  5. Dyslipidemia Controlled. Continue current regime.  - simvastatin (ZOCOR) 20 MG tablet; TAKE 1 TABLET EVERY DAY  AT  6PM  6. Candida rash of groin Without exam unable to be definitive, but per history most likely cause of sx. Treating with nystatin.  Other orders - metFORMIN (GLUCOPHAGE-XR) 500 MG 24 hr tablet; Take 2 tablets (1,000 mg total) by mouth daily with breakfast. - irbesartan-hydrochlorothiazide (AVALIDE) 300-12.5 MG tablet; Take 1 tablet by mouth daily. - cloNIDine (CATAPRES) 0.1 MG tablet; Take 1 tablet (0.1 mg total) by mouth at bedtime. - nystatin cream (MYCOSTATIN); Apply 1 application topically 2 (two) times daily.  FOLLOW-UP: 3 months   The above assessment and management plan was discussed with the patient. The patient verbalized understanding of and has agreed to the management plan. Patient is aware to call the clinic if symptoms persist or worsen. Patient is aware when to return to the clinic for a follow-up visit. Patient educated on when it is appropriate to go to the emergency department.    I provided 23 minutes of  non-face-to-face time during this encounter.  Rutherford Guys, MD [facility] [place], [place] [address] Ph.  [phone] Fax [phone]

## 2018-11-21 ENCOUNTER — Telehealth: Payer: Self-pay | Admitting: Family Medicine

## 2018-11-21 NOTE — Telephone Encounter (Signed)
Called pt LVM for her to call and set up lab appt  Per Dr. Shauna Hugh

## 2018-11-22 ENCOUNTER — Ambulatory Visit (INDEPENDENT_AMBULATORY_CARE_PROVIDER_SITE_OTHER): Payer: Medicare HMO | Admitting: Family Medicine

## 2018-11-22 ENCOUNTER — Other Ambulatory Visit: Payer: Self-pay

## 2018-11-22 DIAGNOSIS — E119 Type 2 diabetes mellitus without complications: Secondary | ICD-10-CM | POA: Diagnosis not present

## 2018-11-22 DIAGNOSIS — I10 Essential (primary) hypertension: Secondary | ICD-10-CM | POA: Diagnosis not present

## 2018-11-22 DIAGNOSIS — Z7901 Long term (current) use of anticoagulants: Secondary | ICD-10-CM | POA: Diagnosis not present

## 2018-11-23 LAB — CMP14+EGFR
ALT: 9 IU/L (ref 0–32)
AST: 18 IU/L (ref 0–40)
Albumin/Globulin Ratio: 1.3 (ref 1.2–2.2)
Albumin: 4.5 g/dL (ref 3.7–4.7)
Alkaline Phosphatase: 88 IU/L (ref 39–117)
BUN/Creatinine Ratio: 17 (ref 12–28)
BUN: 17 mg/dL (ref 8–27)
Bilirubin Total: 0.6 mg/dL (ref 0.0–1.2)
CO2: 22 mmol/L (ref 20–29)
Calcium: 9.6 mg/dL (ref 8.7–10.3)
Chloride: 102 mmol/L (ref 96–106)
Creatinine, Ser: 0.98 mg/dL (ref 0.57–1.00)
GFR calc Af Amer: 63 mL/min/{1.73_m2} (ref >59)
GFR calc non Af Amer: 55 mL/min/{1.73_m2} — ABNORMAL LOW (ref >59)
Globulin, Total: 3.4 g/dL (ref 1.5–4.5)
Glucose: 77 mg/dL (ref 65–99)
Potassium: 4.1 mmol/L (ref 3.5–5.2)
Sodium: 143 mmol/L (ref 134–144)
Total Protein: 7.9 g/dL (ref 6.0–8.5)

## 2018-11-23 LAB — CBC
Hematocrit: 43 % (ref 34.0–46.6)
Hemoglobin: 13.7 g/dL (ref 11.1–15.9)
MCH: 27.5 pg (ref 26.6–33.0)
MCHC: 31.9 g/dL (ref 31.5–35.7)
MCV: 86 fL (ref 79–97)
Platelets: 118 10*3/uL — ABNORMAL LOW (ref 150–450)
RBC: 4.98 x10E6/uL (ref 3.77–5.28)
RDW: 13.9 % (ref 11.7–15.4)
WBC: 4.2 10*3/uL (ref 3.4–10.8)

## 2018-11-23 LAB — LIPID PANEL
Chol/HDL Ratio: 2.5 ratio (ref 0.0–4.4)
Cholesterol, Total: 178 mg/dL (ref 100–199)
HDL: 70 mg/dL (ref >39)
LDL Calculated: 93 mg/dL (ref 0–99)
Triglycerides: 76 mg/dL (ref 0–149)
VLDL Cholesterol Cal: 15 mg/dL (ref 5–40)

## 2018-11-23 LAB — TSH: TSH: 1.51 u[IU]/mL (ref 0.450–4.500)

## 2018-11-23 LAB — HEMOGLOBIN A1C
Est. average glucose Bld gHb Est-mCnc: 143 mg/dL
Hgb A1c MFr Bld: 6.6 % — ABNORMAL HIGH (ref 4.8–5.6)

## 2019-01-15 DIAGNOSIS — E113391 Type 2 diabetes mellitus with moderate nonproliferative diabetic retinopathy without macular edema, right eye: Secondary | ICD-10-CM | POA: Diagnosis not present

## 2019-01-15 LAB — HM DIABETES EYE EXAM

## 2019-02-06 ENCOUNTER — Encounter: Payer: Self-pay | Admitting: Emergency Medicine

## 2019-02-06 ENCOUNTER — Other Ambulatory Visit: Payer: Self-pay

## 2019-02-06 ENCOUNTER — Ambulatory Visit (INDEPENDENT_AMBULATORY_CARE_PROVIDER_SITE_OTHER): Payer: Medicare HMO | Admitting: Emergency Medicine

## 2019-02-06 ENCOUNTER — Ambulatory Visit (INDEPENDENT_AMBULATORY_CARE_PROVIDER_SITE_OTHER): Payer: Medicare HMO

## 2019-02-06 VITALS — BP 140/82 | HR 68 | Temp 98.0°F | Resp 16 | Ht 63.0 in | Wt 192.0 lb

## 2019-02-06 DIAGNOSIS — M25572 Pain in left ankle and joints of left foot: Secondary | ICD-10-CM

## 2019-02-06 DIAGNOSIS — E119 Type 2 diabetes mellitus without complications: Secondary | ICD-10-CM | POA: Diagnosis not present

## 2019-02-06 NOTE — Assessment & Plan Note (Signed)
May be gout but no prior history.  No injuries and x-ray negative for bony injuries.  On long-term anticoagulation with Xarelto.  NSAIDs and or prednisone not recommended.  Advised to take Tylenol as needed with rest and elevation.  Also apply warm compresses on and off for the next couple days.  Follow-up in the office if no better after 1 week or earlier if worse.

## 2019-02-06 NOTE — Progress Notes (Signed)
Catherine Joseph 78 y.o.   Chief Complaint  Patient presents with  . Leg Pain    left leg is swollen and painful, x 1 wk    HISTORY OF PRESENT ILLNESS: This is a 78 y.o. female diabetic with a history of A. fib on long-term anticoagulation complaining of pain and swelling to left outer ankle area for several days.  Denies injury.  Denies any other significant symptoms.  No history of gout.  PCP is Dr. Pamella Pert.  First visit with me.  Leg Pain      Prior to Admission medications   Medication Sig Start Date End Date Taking? Authorizing Provider  acetaminophen (TYLENOL) 500 MG tablet Take 1 tablet (500 mg total) by mouth every 6 (six) hours as needed. 11/19/16  Yes Shawnee Knapp, MD  Alcohol Swabs (B-D SINGLE USE SWABS REGULAR) PADS USE TO CHECK BLOOD SUGAR DAILY  06/03/17  Yes Shawnee Knapp, MD  Blood Glucose Calibration (SURECHEK CONTROL SOLUTION) Normal LIQD Use to check blood sugar daily. Dx E11.65 09/08/15  Yes Shawnee Knapp, MD  blood glucose meter kit and supplies Use to test blood sugar once daily. Dx: E11.65 02/27/18  Yes Shawnee Knapp, MD  Blood Glucose Monitoring Suppl (TRUE METRIX AIR GLUCOSE METER) DEVI 1 each by Does not apply route daily. Use to test blood sugar once daily. Dx: E11.65 09/09/15  Yes Ivar Drape D, PA  Calcium-Magnesium-Vitamin D (CALCIUM 500 PO) Take 500 mg by mouth daily.   Yes [provider]  cloNIDine (CATAPRES) 0.1 MG tablet Take 1 tablet (0.1 mg total) by mouth at bedtime. 10/27/18  Yes Rutherford Guys, MD  flecainide (TAMBOCOR) 100 MG tablet TAKE 1/2 TABLET (50 MG) BY MOUTH 2 TIMES DAILY 10/13/18  Yes Josue Hector, MD  glipiZIDE (GLUCOTROL) 10 MG tablet Take 1 tablet (10 mg total) by mouth 2 (two) times daily before a meal. 10/27/18  Yes Rutherford Guys, MD  irbesartan-hydrochlorothiazide (AVALIDE) 300-12.5 MG tablet Take 1 tablet by mouth daily. 10/27/18  Yes Rutherford Guys, MD  metFORMIN (GLUCOPHAGE-XR) 500 MG 24 hr tablet Take 2 tablets (1,000  mg total) by mouth daily with breakfast. 10/27/18  Yes Rutherford Guys, MD  nystatin cream (MYCOSTATIN) Apply 1 application topically 2 (two) times daily. 10/27/18  Yes Rutherford Guys, MD  PAZEO 0.7 % SOLN Place 1 drop into both eyes daily. 05/24/16  Yes [provider]  polyethylene glycol powder (GLYCOLAX/MIRALAX) powder Take 17 g by mouth 2 (two) times daily as needed. Patient taking differently: Take 17 g by mouth 2 (two) times daily as needed for moderate constipation.  11/19/16  Yes Shawnee Knapp, MD  rivaroxaban (XARELTO) 20 MG TABS tablet Take 1 tablet (20 mg total) by mouth daily with supper. 08/31/18  Yes Josue Hector, MD  simvastatin (ZOCOR) 20 MG tablet TAKE 1 TABLET EVERY DAY  AT  6PM 10/27/18  Yes Rutherford Guys, MD  TRUE METRIX BLOOD GLUCOSE TEST test strip TEST BLOOD SUGAR ONE TIME DAILY 06/03/17  Yes Shawnee Knapp, MD  TRUEPLUS LANCETS 28G MISC USE TO TEST BLOOD SUGAR ONCE DAILY. 06/03/17  Yes Shawnee Knapp, MD    Allergies  Allergen Reactions  . Codeine Other (See Comments)    vertigo    Patient Active Problem List   Diagnosis Date Noted  . Osteopenia 04/01/2016  . Microhematuria 11/11/2014  . Atrial fibrillation ([place]) 04/22/2014  . 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 Lung  nodule 02/01/2012  . Mixed hyperlipidemia 08/03/2011  . Bradycardia 08/03/2011  . Hypertension 07/22/2011  . Diabetes mellitus, type II ([place]) 07/22/2011    Past Medical History:  Diagnosis Date  . Anemia   . Arthritis   . Bradycardia   . Cataract   . Chronic kidney disease   . Diabetes mellitus type II   . Hypertension   . Osteoporosis   . Syncope   . Weakness     Past Surgical History:  Procedure Laterality Date  . ABDOMINAL HYSTERECTOMY    . CESAREAN SECTION    . COLON SURGERY    . EYE SURGERY    . FRACTURE SURGERY      Social History   Socioeconomic History  . Marital status: Widowed    Spouse name: Not on file  . Number of children: Not on file  . Years of  education: Not on file  . Highest education level: Not on file  Occupational History  . Not on file  Social Needs  . Financial resource strain: Not on file  . Food insecurity    Worry: Not on file    Inability: Not on file  . Transportation needs    Medical: Not on file    Non-medical: Not on file  Tobacco Use  . Smoking status: Never Smoker  . Smokeless tobacco: Never Used  Substance and Sexual Activity  . Alcohol use: No  . Drug use: No  . Sexual activity: Not on file  Lifestyle  . Physical activity    Days per week: Not on file    Minutes per session: Not on file  . Stress: Not on file  Relationships  . Social Herbalist on phone: Not on file    Gets together: Not on file    Attends religious service: Not on file    Active member of club or organization: Not on file    Attends meetings of clubs or organizations: Not on file    Relationship status: Not on file  . Intimate partner violence    Fear of current or ex partner: Not on file    Emotionally abused: Not on file    Physically abused: Not on file    Forced sexual activity: Not on file  Other Topics Concern  . Not on file  Social History Narrative  . Not on file    Family History  Problem Relation Age of Onset  . Diabetes Sister   . Hyperlipidemia Sister   . Hypertension Sister   . Diabetes Brother   . Hyperlipidemia Brother   . Hypertension Brother      Review of Systems  Constitutional: Negative.  Negative for chills and fever.  HENT: Negative.  Negative for congestion and sore throat.   Eyes: Negative.   Respiratory: Negative.  Negative for cough and shortness of breath.   Cardiovascular: Negative.  Negative for chest pain.  Gastrointestinal: Negative for abdominal pain, diarrhea, nausea and vomiting.  Skin: Negative.  Negative for rash.  Neurological: Negative for dizziness and headaches.  All other systems reviewed and are negative.  Today's Vitals   02/06/19 1026  BP: 140/82   Pulse: 68  Resp: 16  Temp: 98 F (36.7 C)  TempSrc: Oral  SpO2: 96%  Weight: 192 lb (87.1 kg)  Height: '5\' 3"'$  (1.6 m)   Body mass index is 34.01 kg/m.   Physical Exam Vitals signs reviewed.  Constitutional:      Appearance: Normal appearance.  HENT:     Head: Normocephalic.  Eyes:     Extraocular Movements: Extraocular movements intact.     Pupils: Pupils are equal, round, and reactive to light.  Neck:     Musculoskeletal: Normal range of motion and neck supple.  Cardiovascular:     Rate and Rhythm: Normal rate. Rhythm irregular.  Pulmonary:     Effort: Pulmonary effort is normal.  Musculoskeletal:     Comments: Left ankle: Mild erythema with mild swelling and tenderness to lateral malleolar area.  Full range of motion.  Left foot: Within normal limits and neurovascularly intact.  Good peripheral pulses and good capillary refill.  Skin:    General: Skin is warm and dry.     Capillary Refill: Capillary refill takes less than 2 seconds.  Neurological:     General: No focal deficit present.     Mental Status: She is alert and oriented to person, place, and time.  Psychiatric:        Mood and Affect: Mood normal.        Behavior: Behavior normal.    Dg Ankle Complete Left  Result Date: 02/06/2019 CLINICAL DATA:  Left ankle pain EXAM: LEFT ANKLE COMPLETE - 3+ VIEW COMPARISON:  None. FINDINGS: Plantar calcaneal spur. No acute bony abnormality. Specifically, no fracture, subluxation, or dislocation. IMPRESSION: No acute bony abnormality. Electronically Signed   By: Rolm Baptise M.D.   On: 02/06/2019 11:41     ASSESSMENT & PLAN: Dealva was seen today for leg pain.  Diagnoses and all orders for this visit:  Acute left ankle pain -     DG Ankle Complete Left; Future  Type 2 diabetes mellitus without complication, without long-term current use of insulin (HCC) -     Microalbumin/Creatinine Ratio, Urine -     HM DIABETES FOOT EXAM  Arthralgia of left ankle  Acute  left ankle pain May be gout but no prior history.  No injuries and x-ray negative for bony injuries.  On long-term anticoagulation with Xarelto.  NSAIDs and or prednisone not recommended.  Advised to take Tylenol as needed with rest and elevation.  Also apply warm compresses on and off for the next couple days.  Follow-up in the office if no better after 1 week or earlier if worse.   Patient Instructions   Take Tylenol every 4-6 hours as needed for ankle pain.  Rest with elevation.  Warm compresses on and off for the next couple days.  Follow-up with Dr. Pamella Pert if no better in 1 week.    If you have lab work done today you will be contacted with your lab results within the next 2 weeks.  If you have not heard from [place] then please contact us. The fastest way to get your results is to register for My Chart.   IF you received an x-ray today, you will receive an invoice from [facility]. Please contact [facility] at [phone] with questions or concerns regarding your invoice.   IF you received labwork today, you will receive an invoice from [facility]. Please contact [facility] at [phone] with questions or concerns regarding your invoice.   Our billing staff will not be able to assist you with questions regarding bills from these companies.  You will be contacted with the lab results as soon as they are available. The fastest way to get your results is to activate your My Chart account. Instructions are located on the last page of this paperwork. If you have not  heard from [place] regarding the results in 2 weeks, please contact this office.      Ankle Pain The ankle joint holds your body weight and allows you to move around. Ankle pain can occur on either side or the back of one ankle or both ankles. Ankle pain may be sharp and burning or dull and aching. There may be tenderness, stiffness, redness, or warmth around the ankle. Many things can cause ankle pain, including an  injury to the area and overuse of the ankle. Follow these instructions at home: Activity  Rest your ankle as told by your health care provider. Avoid any activities that cause ankle pain.  Do not use the injured limb to support your body weight until your health care provider says that you can. Use crutches as told by your health care provider.  Do exercises as told by your health care provider.  Ask your health care provider when it is safe to drive if you have a brace on your ankle. If you have a brace:  Wear the brace as told by your health care provider. Remove it only as told by your health care provider.  Loosen the brace if your toes tingle, become numb, or turn cold and blue.  Keep the brace clean.  If the brace is not waterproof: ? Do not let it get wet. ? Cover it with a watertight covering when you take a bath or shower. If you were given an elastic bandage:   Remove it when you take a bath or a shower.  Try not to move your ankle very much, but wiggle your toes from time to time. This helps to prevent swelling.  Adjust the bandage to make it more comfortable if it feels too tight.  Loosen the bandage if you have numbness or tingling in your foot or if your foot turns cold and blue. Managing pain, stiffness, and swelling   If directed, put ice on the painful area. ? If you have a removable brace or elastic bandage, remove it as told by your health care provider. ? Put ice in a plastic bag. ? Place a towel between your skin and the bag. ? Leave the ice on for 20 minutes, 2-3 times a day.  Move your toes often to avoid stiffness and to lessen swelling.  Raise (elevate) your ankle above the level of your heart while you are sitting or lying down. General instructions  Record information about your pain. Writing down the following may be helpful for you and your health care provider: ? How often you have ankle pain. ? Where the pain is located. ? What the pain  feels like.  If treatment involves wearing a prescribed shoe or insole, make sure you wear it correctly and for as long as told by your health care provider.  Take over-the-counter and prescription medicines only as told by your health care provider.  Keep all follow-up visits as told by your health care provider. This is important. Contact a health care provider if:  Your pain gets worse.  Your pain is not relieved with medicines.  You have a fever or chills.  You are having more trouble with walking.  You have new symptoms. Get help right away if:  Your foot, leg, toes, or ankle: ? Tingles or becomes numb. ? Becomes swollen. ? Turns pale or blue. Summary  Ankle pain can occur on either side or the back of one ankle or both ankles.  Ankle pain may  be sharp and burning or dull and aching.  Rest your ankle as told by your health care provider. If told, apply ice to the area.  Take over-the-counter and prescription medicines only as told by your health care provider. This information is not intended to replace advice given to you by your health care provider. Make sure you discuss any questions you have with your health care provider. Document Released: 11/25/2009 Document Revised: 09/26/2018 Document Reviewed: 12/14/2017 Elsevier Patient Education  2020 [employer]       Agustina Caroli, MD [facility]

## 2019-02-06 NOTE — Patient Instructions (Addendum)
Take Tylenol every 4-6 hours as needed for ankle pain.  Rest with elevation.  Warm compresses on and off for the next couple days.  Follow-up with Dr. Pamella Pert if no better in 1 week.    If you have lab work done today you will be contacted with your lab results within the next 2 weeks.  If you have not heard from [place] then please contact us. The fastest way to get your results is to register for My Chart.   IF you received an x-ray today, you will receive an invoice from [facility]. Please contact [facility] at [phone] with questions or concerns regarding your invoice.   IF you received labwork today, you will receive an invoice from [facility]. Please contact [facility] at [phone] with questions or concerns regarding your invoice.   Our billing staff will not be able to assist you with questions regarding bills from these companies.  You will be contacted with the lab results as soon as they are available. The fastest way to get your results is to activate your My Chart account. Instructions are located on the last page of this paperwork. If you have not heard from [place] regarding the results in 2 weeks, please contact this office.      Ankle Pain The ankle joint holds your body weight and allows you to move around. Ankle pain can occur on either side or the back of one ankle or both ankles. Ankle pain may be sharp and burning or dull and aching. There may be tenderness, stiffness, redness, or warmth around the ankle. Many things can cause ankle pain, including an injury to the area and overuse of the ankle. Follow these instructions at home: Activity  Rest your ankle as told by your health care provider. Avoid any activities that cause ankle pain.  Do not use the injured limb to support your body weight until your health care provider says that you can. Use crutches as told by your health care provider.  Do exercises as told by your health care provider.  Ask your  health care provider when it is safe to drive if you have a brace on your ankle. If you have a brace:  Wear the brace as told by your health care provider. Remove it only as told by your health care provider.  Loosen the brace if your toes tingle, become numb, or turn cold and blue.  Keep the brace clean.  If the brace is not waterproof: ? Do not let it get wet. ? Cover it with a watertight covering when you take a bath or shower. If you were given an elastic bandage:   Remove it when you take a bath or a shower.  Try not to move your ankle very much, but wiggle your toes from time to time. This helps to prevent swelling.  Adjust the bandage to make it more comfortable if it feels too tight.  Loosen the bandage if you have numbness or tingling in your foot or if your foot turns cold and blue. Managing pain, stiffness, and swelling   If directed, put ice on the painful area. ? If you have a removable brace or elastic bandage, remove it as told by your health care provider. ? Put ice in a plastic bag. ? Place a towel between your skin and the bag. ? Leave the ice on for 20 minutes, 2-3 times a day.  Move your toes often to avoid stiffness and to lessen swelling.  Raise (elevate) your ankle above the level of your heart while you are sitting or lying down. General instructions  Record information about your pain. Writing down the following may be helpful for you and your health care provider: ? How often you have ankle pain. ? Where the pain is located. ? What the pain feels like.  If treatment involves wearing a prescribed shoe or insole, make sure you wear it correctly and for as long as told by your health care provider.  Take over-the-counter and prescription medicines only as told by your health care provider.  Keep all follow-up visits as told by your health care provider. This is important. Contact a health care provider if:  Your pain gets worse.  Your pain is  not relieved with medicines.  You have a fever or chills.  You are having more trouble with walking.  You have new symptoms. Get help right away if:  Your foot, leg, toes, or ankle: ? Tingles or becomes numb. ? Becomes swollen. ? Turns pale or blue. Summary  Ankle pain can occur on either side or the back of one ankle or both ankles.  Ankle pain may be sharp and burning or dull and aching.  Rest your ankle as told by your health care provider. If told, apply ice to the area.  Take over-the-counter and prescription medicines only as told by your health care provider. This information is not intended to replace advice given to you by your health care provider. Make sure you discuss any questions you have with your health care provider. Document Released: 11/25/2009 Document Revised: 09/26/2018 Document Reviewed: 12/14/2017 Elsevier Patient Education  2020 [employer].

## 2019-02-07 LAB — MICROALBUMIN / CREATININE URINE RATIO
Creatinine, Urine: 204.3 mg/dL
Microalb/Creat Ratio: 8 mg/g creat (ref 0–29)
Microalbumin, Urine: 16.4 ug/mL

## 2019-02-15 ENCOUNTER — Ambulatory Visit (INDEPENDENT_AMBULATORY_CARE_PROVIDER_SITE_OTHER): Payer: Medicare HMO | Admitting: Family Medicine

## 2019-02-15 ENCOUNTER — Other Ambulatory Visit: Payer: Self-pay

## 2019-02-15 ENCOUNTER — Encounter: Payer: Self-pay | Admitting: Family Medicine

## 2019-02-15 VITALS — BP 134/72 | HR 56 | Temp 99.0°F | Ht 63.0 in | Wt 191.0 lb

## 2019-02-15 DIAGNOSIS — I10 Essential (primary) hypertension: Secondary | ICD-10-CM

## 2019-02-15 DIAGNOSIS — M25572 Pain in left ankle and joints of left foot: Secondary | ICD-10-CM

## 2019-02-15 NOTE — Progress Notes (Signed)
8/27/20203:18 PM  Catherine Joseph September 23, 1940, 78 y.o., female [id number]  Chief Complaint  Patient presents with  . Ankle Pain    saw [facility], says it may be gout. Says she is not having any pain today in the ankle    HPI:   Patient is a 78 y.o. female with past medical history significant for HTN, DM2, Afib, HLP who presents today for followup on left ankle pain  Saw Dr Mitchel Honour on 02/06/2019 Treated with APAP, negative xray Patient has been resting, elevating, applying heat and taking APAP as needed She reports it is much better, not painful and barely swollen She reports she has been eating more shrimp than usual She denies dx gout  Depression screen [facility] 2/9 02/15/2019 02/06/2019 10/27/2018  Decreased Interest 0 0 0  Down, Depressed, Hopeless 0 0 0  PHQ - 2 Score 0 0 0    Fall Risk  02/15/2019 02/06/2019 10/27/2018 06/12/2018 03/20/2018  Falls in the past year? 0 0 0 0 No  Number falls in past yr: 0 0 0 0 -  Injury with Fall? 0 0 0 0 -  Comment - - - - -     Allergies  Allergen Reactions  . Codeine Other (See Comments)    vertigo    Prior to Admission medications   Medication Sig Start Date End Date Taking? Authorizing Provider  acetaminophen (TYLENOL) 500 MG tablet Take 1 tablet (500 mg total) by mouth every 6 (six) hours as needed. 11/19/16  Yes Shawnee Knapp, MD  Alcohol Swabs (B-D SINGLE USE SWABS REGULAR) PADS USE TO CHECK BLOOD SUGAR DAILY  06/03/17  Yes Shawnee Knapp, MD  Blood Glucose Calibration (SURECHEK CONTROL SOLUTION) Normal LIQD Use to check blood sugar daily. Dx E11.65 09/08/15  Yes Shawnee Knapp, MD  blood glucose meter kit and supplies Use to test blood sugar once daily. Dx: E11.65 02/27/18  Yes Shawnee Knapp, MD  Blood Glucose Monitoring Suppl (TRUE METRIX AIR GLUCOSE METER) DEVI 1 each by Does not apply route daily. Use to test blood sugar once daily. Dx: E11.65 09/09/15  Yes Ivar Drape D, PA  Calcium-Magnesium-Vitamin D (CALCIUM 500 PO) Take 500 mg by  mouth daily.   Yes [provider]  cloNIDine (CATAPRES) 0.1 MG tablet Take 1 tablet (0.1 mg total) by mouth at bedtime. 10/27/18  Yes Rutherford Guys, MD  flecainide (TAMBOCOR) 100 MG tablet TAKE 1/2 TABLET (50 MG) BY MOUTH 2 TIMES DAILY 10/13/18  Yes Josue Hector, MD  glipiZIDE (GLUCOTROL) 10 MG tablet Take 1 tablet (10 mg total) by mouth 2 (two) times daily before a meal. 10/27/18  Yes Rutherford Guys, MD  irbesartan-hydrochlorothiazide (AVALIDE) 300-12.5 MG tablet Take 1 tablet by mouth daily. 10/27/18  Yes Rutherford Guys, MD  metFORMIN (GLUCOPHAGE-XR) 500 MG 24 hr tablet Take 2 tablets (1,000 mg total) by mouth daily with breakfast. 10/27/18  Yes Rutherford Guys, MD  nystatin cream (MYCOSTATIN) Apply 1 application topically 2 (two) times daily. 10/27/18  Yes Rutherford Guys, MD  PAZEO 0.7 % SOLN Place 1 drop into both eyes daily. 05/24/16  Yes [provider]  polyethylene glycol powder (GLYCOLAX/MIRALAX) powder Take 17 g by mouth 2 (two) times daily as needed. Patient taking differently: Take 17 g by mouth 2 (two) times daily as needed for moderate constipation.  11/19/16  Yes Shawnee Knapp, MD  rivaroxaban (XARELTO) 20 MG TABS tablet Take 1 tablet (20 mg total) by  mouth daily with supper. 08/31/18  Yes Josue Hector, MD  simvastatin (ZOCOR) 20 MG tablet TAKE 1 TABLET EVERY DAY  AT  6PM 10/27/18  Yes Rutherford Guys, MD  TRUE METRIX BLOOD GLUCOSE TEST test strip TEST BLOOD SUGAR ONE TIME DAILY 06/03/17  Yes Shawnee Knapp, MD  TRUEPLUS LANCETS 28G MISC USE TO TEST BLOOD SUGAR ONCE DAILY. 06/03/17  Yes Shawnee Knapp, MD    Past Medical History:  Diagnosis Date  . Anemia   . Arthritis   . Bradycardia   . Cataract   . Chronic kidney disease   . Diabetes mellitus type II   . Hypertension   . Osteoporosis   . Syncope   . Weakness     Past Surgical History:  Procedure Laterality Date  . ABDOMINAL HYSTERECTOMY    . CESAREAN SECTION    . COLON SURGERY    . EYE SURGERY    .  FRACTURE SURGERY      Social History   Tobacco Use  . Smoking status: Never Smoker  . Smokeless tobacco: Never Used  Substance Use Topics  . Alcohol use: No    Family History  Problem Relation Age of Onset  . Diabetes Sister   . Hyperlipidemia Sister   . Hypertension Sister   . Diabetes Brother   . Hyperlipidemia Brother   . Hypertension Brother     ROS Per hpi  OBJECTIVE:  UEBVP'L WUZRVU   02/15/19 1512 02/15/19 1538  BP: (!) 164/80 134/72  Pulse: (!) 56   Temp: 99 F (37.2 C)   SpO2: 99%   Weight: 191 lb (86.6 kg)   Height: _0  (1.6 m)    Body mass index is 33.83 kg/m.   Physical Exam   Gen: AAOx3, NAD Right ankle: mild swelling, no erythema, not TTP, NVI  No results found for this or any previous visit (from the past 24 hour(s)).  No results found.   ASSESSMENT and PLAN  1. Acute left ankle pain Improved. Cont current management. Check uric acid - Uric acid  2. Essential hypertension Controlled. Continue current regime.  - Care order/instruction:  Return in about 14 weeks (around 05/24/2019) for routine.    Rutherford Guys, MD Primary Care at [facility] [place], [place] [address] Ph.  [phone] Fax [phone]

## 2019-02-15 NOTE — Patient Instructions (Addendum)
   Glad your ankle is better. Continue caring for it as you have been Lets check blood work to see if you do have gout. Otherwise I will see you in December  If you have lab work done today you will be contacted with your lab results within the next 2 weeks.  If you have not heard from [place] then please contact us. The fastest way to get your results is to register for My Chart.   IF you received an x-ray today, you will receive an invoice from [facility]. Please contact [facility] at [phone] with questions or concerns regarding your invoice.   IF you received labwork today, you will receive an invoice from [facility]. Please contact [facility] at [phone] with questions or concerns regarding your invoice.   Our billing staff will not be able to assist you with questions regarding bills from these companies.  You will be contacted with the lab results as soon as they are available. The fastest way to get your results is to activate your My Chart account. Instructions are located on the last page of this paperwork. If you have not heard from [place] regarding the results in 2 weeks, please contact this office.

## 2019-02-16 LAB — URIC ACID: Uric Acid: 6.5 mg/dL (ref 2.5–7.1)

## 2019-02-27 ENCOUNTER — Telehealth: Payer: Self-pay | Admitting: Cardiovascular Disease

## 2019-02-27 NOTE — Telephone Encounter (Signed)
New Message      Pt c/o medication issue:  1. Name of Medication: Xarelto   2. How are you currently taking this medication (dosage and times per day)? 1 x daily 20mg    3. Are you having a reaction (difficulty breathing--STAT)? No   4. What is your medication issue? $125-145 Pt says her medication is not this much for one month and she says it is too expensive. She cannot afford it    Please call

## 2019-02-27 NOTE — Telephone Encounter (Signed)
Catherine Joseph, would you be able to reach out to this pt about potentially qualifying for Xarelto patient assistance through [employer]? I think [employer] is also temporarily doing Tier exceptions for DOACs through the end of the year due to [place].

## 2019-03-02 NOTE — Telephone Encounter (Signed)
Spoke with patient and her daughter. Advised to apply for patient assistance through [place] again (applied about 2 years ago). Or could switch to coumadin. States she was on in the past but had to pay for taxi and visit and it was too much. [employer] denied tier exception already. Patient is in coverage gap.  Daughter wants to know if she can be tested again to see if she still needs blood thinner. Explained that with her age and history of afib that she is at a high risk for stroke. There really isn't a test that they can do other than placing a monitor, but that will only tell them her afib burden for that period of time.I transferred daughter to schedulers to make appointment with Dr. Johnsie Cancel to discuss.  Will give 2 weeks worth of samples

## 2019-03-05 ENCOUNTER — Ambulatory Visit (INDEPENDENT_AMBULATORY_CARE_PROVIDER_SITE_OTHER): Payer: Medicare HMO | Admitting: Family Medicine

## 2019-03-05 VITALS — BP 134/72 | Ht 63.0 in | Wt 191.0 lb

## 2019-03-05 DIAGNOSIS — Z0000 Encounter for general adult medical examination without abnormal findings: Secondary | ICD-10-CM

## 2019-03-05 NOTE — Patient Instructions (Addendum)
Thank you for taking time to come for your Medicare Wellness Visit. I appreciate your ongoing commitment to your health goals. Please review the following plan we discussed and let me know if I can assist you in the future.  Milda Lindvall LPN  Preventive Care 80 Years and Older, Female Preventive care refers to lifestyle choices and visits with your health care provider that can promote health and wellness. This includes:  A yearly physical exam. This is also called an annual well check.  Regular dental and eye exams.  Immunizations.  Screening for certain conditions.  Healthy lifestyle choices, such as diet and exercise. What can I expect for my preventive care visit? Physical exam Your health care provider will check:  Height and weight. These may be used to calculate body mass index (BMI), which is a measurement that tells if you are at a healthy weight.  Heart rate and blood pressure.  Your skin for abnormal spots. Counseling Your health care provider may ask you questions about:  Alcohol, tobacco, and drug use.  Emotional well-being.  Home and relationship well-being.  Sexual activity.  Eating habits.  History of falls.  Memory and ability to understand (cognition).  Work and work environment.  Pregnancy and menstrual history. What immunizations do I need?  Influenza (flu) vaccine  This is recommended every year. Tetanus, diphtheria, and pertussis (Tdap) vaccine  You may need a Td booster every 10 years. Varicella (chickenpox) vaccine  You may need this vaccine if you have not already been vaccinated. Zoster (shingles) vaccine  You may need this after age 60. Pneumococcal conjugate (PCV13) vaccine  One dose is recommended after age 65. Pneumococcal polysaccharide (PPSV23) vaccine  One dose is recommended after age 65. Measles, mumps, and rubella (MMR) vaccine  You may need at least one dose of MMR if you were born in 1957 or later. You may also  need a second dose. Meningococcal conjugate (MenACWY) vaccine  You may need this if you have certain conditions. Hepatitis A vaccine  You may need this if you have certain conditions or if you travel or work in places where you may be exposed to hepatitis A. Hepatitis B vaccine  You may need this if you have certain conditions or if you travel or work in places where you may be exposed to hepatitis B. Haemophilus influenzae type b (Hib) vaccine  You may need this if you have certain conditions. You may receive vaccines as individual doses or as more than one vaccine together in one shot (combination vaccines). Talk with your health care provider about the risks and benefits of combination vaccines. What tests do I need? Blood tests  Lipid and cholesterol levels. These may be checked every 5 years, or more frequently depending on your overall health.  Hepatitis C test.  Hepatitis B test. Screening  Lung cancer screening. You may have this screening every year starting at age 55 if you have a 30-pack-year history of smoking and currently smoke or have quit within the past 15 years.  Colorectal cancer screening. All adults should have this screening starting at age 50 and continuing until age 75. Your health care provider may recommend screening at age 45 if you are at increased risk. You will have tests every 1-10 years, depending on your results and the type of screening test.  Diabetes screening. This is done by checking your blood sugar (glucose) after you have not eaten for a while (fasting). You may have this done every 1-3   years.  Mammogram. This may be done every 1-2 years. Talk with your health care provider about how often you should have regular mammograms.  BRCA-related cancer screening. This may be done if you have a family history of breast, ovarian, tubal, or peritoneal cancers. Other tests  Sexually transmitted disease (STD) testing.  Bone density scan. This is done  to screen for osteoporosis. You may have this done starting at age 6. Follow these instructions at home: Eating and drinking  Eat a diet that includes fresh fruits and vegetables, whole grains, lean protein, and low-fat dairy products. Limit your intake of foods with high amounts of sugar, saturated fats, and salt.  Take vitamin and mineral supplements as recommended by your health care provider.  Do not drink alcohol if your health care provider tells you not to drink.  If you drink alcohol: ? Limit how much you have to 0-1 drink a day. ? Be aware of how much alcohol is in your drink. In the [place] one drink equals one 12 oz bottle of beer (355 mL), one 5 oz glass of wine (148 mL), or one 1 oz glass of hard liquor (44 mL). Lifestyle  Take daily care of your teeth and gums.  Stay active. Exercise for at least 30 minutes on 5 or more days each week.  Do not use any products that contain nicotine or tobacco, such as cigarettes, e-cigarettes, and chewing tobacco. If you need help quitting, ask your health care provider.  If you are sexually active, practice safe sex. Use a condom or other form of protection in order to prevent STIs (sexually transmitted infections).  Talk with your health care provider about taking a low-dose aspirin or statin. What's next?  Go to your health care provider once a year for a well check visit.  Ask your health care provider how often you should have your eyes and teeth checked.  Stay up to date on all vaccines. This information is not intended to replace advice given to you by your health care provider. Make sure you discuss any questions you have with your health care provider. Document Released: 07/04/2015 Document Revised: 06/01/2018 Document Reviewed: 06/01/2018 Elsevier Patient Education  2020 [employer].

## 2019-03-05 NOTE — Progress Notes (Signed)
Patient ID: Catherine Joseph, female   DOB: [birth year], 78 y.o.    MRN: [id number]      78 y.o.  [place] female history of PAF on flecainide. Has been on coumadin and now xarelto for anticoagulation History of atypical chest pain with normal stress echo 2013 and normal myovue 03/21/14   Travels to [place] for extended periods  Lives with daughter/husband and 3 grand children ages 54, 69 and 3  No palpitations dyspnea or chest pain No bleeding issues Dr Brigitte Pulse checks her lab work   Has some chronic vertigo and BPPV  She is concerned that her med prices on xarelto and flecainide have gone up  She is widowed Living with her daughter and 4 grandchildren 3 sisters passed away They lived in [place] She is originally from [place]   Only concern today is cost of xarelto an flecainide    ROS: Denies fever, malais, weight loss, blurry vision, decreased visual acuity, cough, sputum, SOB, hemoptysis, pleuritic pain, palpitaitons, heartburn, abdominal pain, melena, lower extremity edema, claudication, or rash.  All other systems reviewed and negative  General: BP 132/82   Pulse 69   Ht _0  (1.575 m)   Wt 189 lb (85.7 kg)   SpO2 97%   BMI 34.57 kg/m  Affect appropriate Healthy:  appears stated age 78: normal Neck supple with no adenopathy JVP normal no bruits no thyromegaly Lungs clear with no wheezing and good diaphragmatic motion Heart:  S1/S2 no murmur, no rub, gallop or click PMI normal Abdomen: benighn, BS positve, no tenderness, no AAA no bruit.  No HSM or HJR Distal pulses intact with no bruits No edema Neuro non-focal Skin warm and dry No muscular weakness    Current Outpatient Medications  Medication Sig Dispense Refill  . acetaminophen (TYLENOL) 500 MG tablet Take 1 tablet (500 mg total) by mouth every 6 (six) hours as needed. 30 tablet 0  . Alcohol Swabs (B-D SINGLE USE SWABS REGULAR) PADS USE TO CHECK BLOOD SUGAR DAILY  100 each 3  . Blood Glucose  Calibration (SURECHEK CONTROL SOLUTION) Normal LIQD Use to check blood sugar daily. Dx E11.65 1 each 3  . blood glucose meter kit and supplies Use to test blood sugar once daily. Dx: E11.65 1 each 0  . Blood Glucose Monitoring Suppl (TRUE METRIX AIR GLUCOSE METER) DEVI 1 each by Does not apply route daily. Use to test blood sugar once daily. Dx: E11.65 1 Device 0  . Calcium-Magnesium-Vitamin D (CALCIUM 500 PO) Take 500 mg by mouth daily.    . cloNIDine (CATAPRES) 0.1 MG tablet Take 1 tablet (0.1 mg total) by mouth at bedtime. 90 tablet 1  . flecainide (TAMBOCOR) 100 MG tablet TAKE 1/2 TABLET (50 MG) BY MOUTH 2 TIMES DAILY 90 tablet 2  . glipiZIDE (GLUCOTROL) 10 MG tablet Take 1 tablet (10 mg total) by mouth 2 (two) times daily before a meal. 180 tablet 1  . irbesartan-hydrochlorothiazide (AVALIDE) 300-12.5 MG tablet Take 1 tablet by mouth daily. 90 tablet 1  . metFORMIN (GLUCOPHAGE-XR) 500 MG 24 hr tablet Take 2 tablets (1,000 mg total) by mouth daily with breakfast. 180 tablet 1  . nystatin cream (MYCOSTATIN) Apply 1 application topically 2 (two) times daily. 30 g 2  . PAZEO 0.7 % SOLN Place 1 drop into both eyes daily.  11  . polyethylene glycol powder (GLYCOLAX/MIRALAX) powder Take 17 g by mouth 2 (two) times daily as needed. 500 g 1  . rivaroxaban (XARELTO) 20 MG  TABS tablet Take 1 tablet (20 mg total) by mouth daily with supper. 30 tablet 5  . simvastatin (ZOCOR) 20 MG tablet TAKE 1 TABLET EVERY DAY  AT  6PM 90 tablet 3  . TRUE METRIX BLOOD GLUCOSE TEST test strip TEST BLOOD SUGAR ONE TIME DAILY 100 each 3  . TRUEPLUS LANCETS 28G MISC USE TO TEST BLOOD SUGAR ONCE DAILY. 100 each 3   Current Facility-Administered Medications  Medication Dose Route Frequency Provider Last Rate Last Dose  . cyanocobalamin ((VITAMIN B-12)) injection 1,000 mcg  1,000 mcg Intramuscular Q30 days Shawnee Knapp, MD   1,000 mcg at 09/09/17 1034    Allergies  Codeine  Electrocardiogram:   08/02/18 SR rate 69 PAC  otherwise normal  03/09/19 SR rate 69 PAC;s no acute changes   Assessment and Plan  PAF: maintains NSR on flecainide Anticoagulation with xarelto although there have been some issues affording long discussion with daughter about need to continue both Filled out patient assistance paperwork for xarelto HTN:  Well controlled.  Continue current medications and low sodium Dash type diet.   Chol:   Cholesterol is at goal.  Continue current dose of statin and diet Rx.  No myalgias or side effects.  F/U  LFT's in 6 months. Lab Results  Component Value Date   LDLCALC 93 11/22/2018   DM:  Discussed low carb diet.  Target hemoglobin A1c is 6.5 or less.  Continue current medications. Lab Results  Component Value Date   HGBA1C 6.6 (H) 11/22/2018    F/U with me in a year   Jenkins Rouge

## 2019-03-05 NOTE — Progress Notes (Signed)
Presents today for [employer] Visit   Date of last exam: 02/15/2019  Interpreter used for this visit? No  I connected with  Catherine Joseph on 03/05/19 by a telephone and verified that I am speaking with the correct person using two identifiers.   I discussed the limitations of evaluation and management by telemedicine. The patient expressed understanding and agreed to proceed.     Patient Care Team: Rutherford Guys, MD as PCP - General (Family Medicine) Josue Hector, MD as PCP - Cardiology (Cardiology) Alexis Frock, MD as Consulting Physician (Urology) Josue Hector, MD as Consulting Physician (Cardiology) Idolina Primer [facility]) Carol Ada, MD as Consulting Physician (Gastroenterology)   Other items to address today:   Discussed immunizations Discussed Eye/Dental Follow up Schedule 05-24-2019 Dr. Pamella Pert    Other Screening: Last screening for diabetes: 11/22/2018 Last lipid screening: 11/22/2018  ADVANCE DIRECTIVES: Discussed: yes On File: no Materials Provided: yes (mailed)  Immunization status:  Immunization History  Administered Date(s) Administered  . Influenza Split 03/31/2012, 02/20/2016  . Influenza, High Dose Seasonal PF 03/06/2016, 03/20/2018  . Influenza,inj,Quad PF,6+ Mos 02/20/2013, 04/11/2014, 06/09/2015, 03/24/2017  . Pneumococcal Conjugate-13 12/03/2013  . Pneumococcal Polysaccharide-23 05/01/2012  . Td 11/11/2014  . Zoster 06/30/2012     There are no preventive care reminders to display for this patient.   Functional Status Survey: Is the patient deaf or have difficulty hearing?: No Does the patient have difficulty seeing, even when wearing glasses/contacts?: No Does the patient have difficulty concentrating, remembering, or making decisions?: No Does the patient have difficulty walking or climbing stairs?: Yes Does the patient have difficulty dressing or bathing?: No Does the patient have difficulty  doing errands alone such as visiting a doctor's office or shopping?: No   6CIT Screen 03/05/2019  What Year? 0 points  What month? 0 points  What time? 0 points  Count back from 20 0 points  Months in reverse 0 points  Repeat phrase 6 points  Total Score 6        Clinical Support from 03/05/2019 in Primary Care at [place]  AUDIT-C Score  0       Home Environment:  Lives in two story  Has trouble sometimes has trouble climbing stairs weak knees No scattered rugs No grab bars Adequate lighting/no clutter   Patient Active Problem List   Diagnosis Date Noted  . Acute left ankle pain 02/06/2019  . Osteopenia 04/01/2016  . Microhematuria 11/11/2014  . Atrial fibrillation ([place]) 04/22/2014  . 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 Lung nodule 02/01/2012  . Mixed hyperlipidemia 08/03/2011  . Bradycardia 08/03/2011  . Hypertension 07/22/2011  . Diabetes mellitus, type II ([place]) 07/22/2011     Past Medical History:  Diagnosis Date  . Anemia   . Arthritis   . Bradycardia   . Cataract   . Chronic kidney disease   . Diabetes mellitus type II   . Hypertension   . Osteoporosis   . Syncope   . Weakness      Past Surgical History:  Procedure Laterality Date  . ABDOMINAL HYSTERECTOMY    . CESAREAN SECTION    . COLON SURGERY    . EYE SURGERY    . FRACTURE SURGERY       Family History  Problem Relation Age of Onset  . Diabetes Sister   . Hyperlipidemia Sister   . Hypertension Sister   . Diabetes Brother   . Hyperlipidemia Brother   .  Hypertension Brother      Social History   Socioeconomic History  . Marital status: Widowed    Spouse name: Not on file  . Number of children: Not on file  . Years of education: Not on file  . Highest education level: Not on file  Occupational History  . Not on file  Social Needs  . Financial resource strain: Not on file  . Food insecurity    Worry: Not on file    Inability: Not on file  . Transportation needs     Medical: Not on file    Non-medical: Not on file  Tobacco Use  . Smoking status: Never Smoker  . Smokeless tobacco: Never Used  Substance and Sexual Activity  . Alcohol use: No  . Drug use: No  . Sexual activity: Not on file  Lifestyle  . Physical activity    Days per week: Not on file    Minutes per session: Not on file  . Stress: Not on file  Relationships  . Social Herbalist on phone: Not on file    Gets together: Not on file    Attends religious service: Not on file    Active member of club or organization: Not on file    Attends meetings of clubs or organizations: Not on file    Relationship status: Not on file  . Intimate partner violence    Fear of current or ex partner: Not on file    Emotionally abused: Not on file    Physically abused: Not on file    Forced sexual activity: Not on file  Other Topics Concern  . Not on file  Social History Narrative  . Not on file     Allergies  Allergen Reactions  . Codeine Other (See Comments)    vertigo     Prior to Admission medications   Medication Sig Start Date End Date Taking? Authorizing Provider  Alcohol Swabs (B-D SINGLE USE SWABS REGULAR) PADS USE TO CHECK BLOOD SUGAR DAILY  06/03/17  Yes Shawnee Knapp, MD  Blood Glucose Calibration (SURECHEK CONTROL SOLUTION) Normal LIQD Use to check blood sugar daily. Dx E11.65 09/08/15  Yes Shawnee Knapp, MD  blood glucose meter kit and supplies Use to test blood sugar once daily. Dx: E11.65 02/27/18  Yes Shawnee Knapp, MD  Blood Glucose Monitoring Suppl (TRUE METRIX AIR GLUCOSE METER) DEVI 1 each by Does not apply route daily. Use to test blood sugar once daily. Dx: E11.65 09/09/15  Yes Ivar Drape D, PA  cloNIDine (CATAPRES) 0.1 MG tablet Take 1 tablet (0.1 mg total) by mouth at bedtime. 10/27/18  Yes Rutherford Guys, MD  flecainide (TAMBOCOR) 100 MG tablet TAKE 1/2 TABLET (50 MG) BY MOUTH 2 TIMES DAILY 10/13/18  Yes Josue Hector, MD  glipiZIDE (GLUCOTROL) 10 MG  tablet Take 1 tablet (10 mg total) by mouth 2 (two) times daily before a meal. 10/27/18  Yes Rutherford Guys, MD  irbesartan-hydrochlorothiazide (AVALIDE) 300-12.5 MG tablet Take 1 tablet by mouth daily. 10/27/18  Yes Rutherford Guys, MD  metFORMIN (GLUCOPHAGE-XR) 500 MG 24 hr tablet Take 2 tablets (1,000 mg total) by mouth daily with breakfast. 10/27/18  Yes Rutherford Guys, MD  nystatin cream (MYCOSTATIN) Apply 1 application topically 2 (two) times daily. 10/27/18  Yes Rutherford Guys, MD  PAZEO 0.7 % SOLN Place 1 drop into both eyes daily. 05/24/16  Yes [provider]  rivaroxaban (XARELTO) 20  MG TABS tablet Take 1 tablet (20 mg total) by mouth daily with supper. 08/31/18  Yes Josue Hector, MD  simvastatin (ZOCOR) 20 MG tablet TAKE 1 TABLET EVERY DAY  AT  6PM 10/27/18  Yes Rutherford Guys, MD  TRUE METRIX BLOOD GLUCOSE TEST test strip TEST BLOOD SUGAR ONE TIME DAILY 06/03/17  Yes Shawnee Knapp, MD  TRUEPLUS LANCETS 28G MISC USE TO TEST BLOOD SUGAR ONCE DAILY. 06/03/17  Yes Shawnee Knapp, MD  acetaminophen (TYLENOL) 500 MG tablet Take 1 tablet (500 mg total) by mouth every 6 (six) hours as needed. Patient not taking: Reported on 03/05/2019 11/19/16   Shawnee Knapp, MD  Calcium-Magnesium-Vitamin D (CALCIUM 500 PO) Take 500 mg by mouth daily.    [provider]  polyethylene glycol powder (GLYCOLAX/MIRALAX) powder Take 17 g by mouth 2 (two) times daily as needed. Patient not taking: Reported on 03/05/2019 11/19/16   Shawnee Knapp, MD     Depression screen [facility] 2/9 03/05/2019 02/15/2019 02/06/2019 10/27/2018 06/12/2018  Decreased Interest 0 0 0 0 0  Down, Depressed, Hopeless 0 0 0 0 0  PHQ - 2 Score 0 0 0 0 0     Fall Risk  03/05/2019 02/15/2019 02/06/2019 10/27/2018 06/12/2018  Falls in the past year? 0 0 0 0 0  Number falls in past yr: 0 0 0 0 0  Injury with Fall? 0 0 0 0 0  Comment - - - - -  Follow up Falls evaluation completed;Education provided;Falls prevention discussed - - - -       PHYSICAL EXAM: BP 134/72 Comment: per patient  Ht _0  (1.6 m)   Wt 191 lb (86.6 kg)   BMI 33.83 kg/m    Wt Readings from Last 3 Encounters:  03/05/19 191 lb (86.6 kg)  02/15/19 191 lb (86.6 kg)  02/06/19 192 lb (87.1 kg)   Medicare annual wellness visit, subsequent   Physical Exam   Education/Counseling provided regarding diet and exercise, prevention of chronic diseases, smoking/tobacco cessation, if applicable, and reviewed "Covered Medicare Preventive Services."

## 2019-03-06 ENCOUNTER — Encounter: Payer: Self-pay | Admitting: Radiology

## 2019-03-09 ENCOUNTER — Telehealth: Payer: Self-pay

## 2019-03-09 ENCOUNTER — Encounter: Payer: Self-pay | Admitting: Cardiovascular Disease

## 2019-03-09 ENCOUNTER — Other Ambulatory Visit: Payer: Self-pay

## 2019-03-09 ENCOUNTER — Ambulatory Visit (INDEPENDENT_AMBULATORY_CARE_PROVIDER_SITE_OTHER): Payer: Medicare HMO | Admitting: Cardiovascular Disease

## 2019-03-09 VITALS — BP 132/82 | HR 69 | Ht 62.0 in | Wt 189.0 lb

## 2019-03-09 DIAGNOSIS — I480 Paroxysmal atrial fibrillation: Secondary | ICD-10-CM

## 2019-03-09 NOTE — Patient Instructions (Signed)

## 2019-03-09 NOTE — Telephone Encounter (Signed)
**Note De-Identified Talli Kimmer Obfuscation** The pts [employer] application for Xarelto was left at the office.  The question of how many people live in the pts household who rely on the pts income was not answered. I called the pts daughter [name] [facility]) who advised that there are 7 people in the household that rely on the pts income.  We wrote in "7" for that question.  We completed the provider part of the application, Dr Rayann Heman (DOD) signed it , and we faxed it to [employer] Pt Asst program.

## 2019-03-14 ENCOUNTER — Other Ambulatory Visit: Payer: Self-pay | Admitting: Family Medicine

## 2019-04-10 ENCOUNTER — Other Ambulatory Visit: Payer: Self-pay | Admitting: Family Medicine

## 2019-04-12 ENCOUNTER — Other Ambulatory Visit: Payer: Self-pay

## 2019-04-12 ENCOUNTER — Encounter: Payer: Self-pay | Admitting: Family Medicine

## 2019-04-12 ENCOUNTER — Ambulatory Visit (INDEPENDENT_AMBULATORY_CARE_PROVIDER_SITE_OTHER): Payer: Medicare HMO | Admitting: Family Medicine

## 2019-04-12 VITALS — BP 132/70 | HR 66 | Temp 98.2°F | Ht 62.0 in | Wt 189.4 lb

## 2019-04-12 DIAGNOSIS — R0781 Pleurodynia: Secondary | ICD-10-CM | POA: Diagnosis not present

## 2019-04-12 DIAGNOSIS — R319 Hematuria, unspecified: Secondary | ICD-10-CM | POA: Diagnosis not present

## 2019-04-12 DIAGNOSIS — Z23 Encounter for immunization: Secondary | ICD-10-CM | POA: Diagnosis not present

## 2019-04-12 DIAGNOSIS — R3915 Urgency of urination: Secondary | ICD-10-CM | POA: Diagnosis not present

## 2019-04-12 LAB — POCT URINALYSIS DIP (MANUAL ENTRY)
Bilirubin, UA: NEGATIVE
Glucose, UA: NEGATIVE mg/dL
Ketones, POC UA: NEGATIVE mg/dL
Leukocytes, UA: NEGATIVE
Nitrite, UA: NEGATIVE
Protein Ur, POC: NEGATIVE mg/dL
Spec Grav, UA: 1.025 (ref 1.010–1.025)
Urobilinogen, UA: 1 E.U./dL
pH, UA: 7.5 (ref 5.0–8.0)

## 2019-04-12 LAB — POC MICROSCOPIC URINALYSIS (UMFC): Mucus: ABSENT

## 2019-04-12 MED ORDER — DICLOFENAC SODIUM 1 % TD GEL: 2.0000 g | Freq: Four times a day (QID) | TRANSDERMAL | 0 refills | Status: DC

## 2019-04-12 NOTE — Patient Instructions (Signed)
° ° ° °  If you have lab work done today you will be contacted with your lab results within the next 2 weeks.  If you have not heard from us then please contact us. The fastest way to get your results is to register for My Chart. ° ° °IF you received an x-ray today, you will receive an invoice from [facility]. Please contact  Radiology at [phone] with questions or concerns regarding your invoice.  ° °IF you received labwork today, you will receive an invoice from [facility]. Please contact [facility] at [phone] with questions or concerns regarding your invoice.  ° °Our billing staff will not be able to assist you with questions regarding bills from these companies. ° °You will be contacted with the lab results as soon as they are available. The fastest way to get your results is to activate your My Chart account. Instructions are located on the last page of this paperwork. If you have not heard from us regarding the results in 2 weeks, please contact this office. °  ° ° ° °

## 2019-04-12 NOTE — Progress Notes (Signed)
10/22/20204:22 PM  Catherine Joseph [birth year], 78 y.o., female [id number]  Chief Complaint  Patient presents with   Diabetes   Hypertension   Abdominal Pain    for the past 2 months, taking otc tylenol for the pain    HPI:   Patient is a 78 y.o. female with past medical history significant for HTN, DM2, afib, HLP who presents today for right rib pain  Last OV Aug 2020 Having chronic right side body pain, waist line, started after treated for abscess c shaped, radiates from back to front Pain is constant Not better or worse with movement, breathing, eating Tylenol used to help but not anymore Normal right ribs and chest xrays 2018  Also having urinary urgency of recent Denies any dysuria or hematuria  Lab Results  Component Value Date   HGBA1C 6.6 (H) 11/22/2018   HGBA1C 6.7 (A) 06/12/2018   HGBA1C 6.7 (A) 12/05/2017   Lab Results  Component Value Date   MICROALBUR 2.0 08/28/2015   LDLCALC 93 11/22/2018   CREATININE 0.98 11/22/2018    Depression screen [facility] 2/9 04/12/2019 03/05/2019 02/15/2019  Decreased Interest 0 0 0  Down, Depressed, Hopeless 0 0 0  PHQ - 2 Score 0 0 0    Fall Risk  04/12/2019 03/05/2019 02/15/2019 02/06/2019 10/27/2018  Falls in the past year? 0 0 0 0 0  Number falls in past yr: 0 0 0 0 0  Injury with Fall? 0 0 0 0 0  Comment - - - - -  Risk for fall due to : Other (Comment) - - - -  Risk for fall due to: Comment geriatric pt - - - -  Follow up - Falls evaluation completed;Education provided;Falls prevention discussed - - -     Allergies  Allergen Reactions   Codeine Other (See Comments)    vertigo    Prior to Admission medications   Medication Sig Start Date End Date Taking? Authorizing Provider  acetaminophen (TYLENOL) 500 MG tablet Take 1 tablet (500 mg total) by mouth every 6 (six) hours as needed. 11/19/16  Yes Shawnee Knapp, MD  Alcohol Swabs (B-D SINGLE USE SWABS REGULAR) PADS USE TO CHECK BLOOD SUGAR DAILY  06/03/17  Yes  Shawnee Knapp, MD  Blood Glucose Calibration (SURECHEK CONTROL SOLUTION) Normal LIQD Use to check blood sugar daily. Dx E11.65 09/08/15  Yes Shawnee Knapp, MD  blood glucose meter kit and supplies Use to test blood sugar once daily. Dx: E11.65 02/27/18  Yes Shawnee Knapp, MD  Blood Glucose Monitoring Suppl (TRUE METRIX AIR GLUCOSE METER) DEVI 1 each by Does not apply route daily. Use to test blood sugar once daily. Dx: E11.65 09/09/15  Yes Ivar Drape D, PA  Calcium-Magnesium-Vitamin D (CALCIUM 500 PO) Take 500 mg by mouth daily.   Yes [provider]  cloNIDine (CATAPRES) 0.1 MG tablet Take 1 tablet (0.1 mg total) by mouth at bedtime. 10/27/18  Yes Rutherford Guys, MD  flecainide (TAMBOCOR) 100 MG tablet TAKE 1/2 TABLET (50 MG) BY MOUTH 2 TIMES DAILY 10/13/18  Yes Josue Hector, MD  glipiZIDE (GLUCOTROL) 10 MG tablet Take 1 tablet (10 mg total) by mouth 2 (two) times daily before a meal. 10/27/18  Yes Rutherford Guys, MD  irbesartan-hydrochlorothiazide (AVALIDE) 300-12.5 MG tablet Take 1 tablet by mouth daily. 10/27/18  Yes Rutherford Guys, MD  metFORMIN (GLUCOPHAGE-XR) 500 MG 24 hr tablet Take 2 tablets (1,000 mg total) by mouth daily with breakfast.  10/27/18  Yes Rutherford Guys, MD  nystatin cream (MYCOSTATIN) APPLY EXTERNALLY TO THE AFFECTED AREA TWICE DAILY 04/10/19  Yes Rutherford Guys, MD  PAZEO 0.7 % SOLN Place 1 drop into both eyes daily. 05/24/16  Yes [provider]  polyethylene glycol powder (GLYCOLAX/MIRALAX) powder Take 17 g by mouth 2 (two) times daily as needed. 11/19/16  Yes Shawnee Knapp, MD  rivaroxaban (XARELTO) 20 MG TABS tablet Take 1 tablet (20 mg total) by mouth daily with supper. 08/31/18  Yes Josue Hector, MD  simvastatin (ZOCOR) 20 MG tablet TAKE 1 TABLET EVERY DAY  AT  6PM 10/27/18  Yes Rutherford Guys, MD  TRUE METRIX BLOOD GLUCOSE TEST test strip TEST BLOOD SUGAR ONE TIME DAILY 06/03/17  Yes Shawnee Knapp, MD  TRUEPLUS LANCETS 28G MISC USE TO TEST BLOOD SUGAR  ONCE DAILY. 06/03/17  Yes Shawnee Knapp, MD    Past Medical History:  Diagnosis Date   Anemia    Arthritis    Bradycardia    Cataract    Chronic kidney disease    Diabetes mellitus type II    Hypertension    Osteoporosis    Syncope    Weakness     Past Surgical History:  Procedure Laterality Date   ABDOMINAL HYSTERECTOMY     CESAREAN SECTION     COLON SURGERY     EYE SURGERY     FRACTURE SURGERY      Social History   Tobacco Use   Smoking status: Never Smoker   Smokeless tobacco: Never Used  Substance Use Topics   Alcohol use: No    Family History  Problem Relation Age of Onset   Diabetes Sister    Hyperlipidemia Sister    Hypertension Sister    Diabetes Brother    Hyperlipidemia Brother    Hypertension Brother     ROS Per hpi  OBJECTIVE:  Today's Vitals   04/12/19 1614  BP: 132/70  Pulse: 66  Temp: 98.2 F (36.8 C)  SpO2: 99%  Weight: 189 lb 6.4 oz (85.9 kg)  Height: _0  (1.575 m)   Body mass index is 34.64 kg/m.   Physical Exam Vitals signs and nursing note reviewed.  Constitutional:      Appearance: She is well-developed.  HENT:     Head: Normocephalic and atraumatic.     Mouth/Throat:     Pharynx: No oropharyngeal exudate.  Eyes:     General: No scleral icterus.    Conjunctiva/sclera: Conjunctivae normal.     Pupils: Pupils are equal, round, and reactive to light.  Neck:     Musculoskeletal: Neck supple.  Cardiovascular:     Rate and Rhythm: Normal rate and regular rhythm.     Heart sounds: Normal heart sounds. No murmur. No friction rub. No gallop.   Pulmonary:     Effort: Pulmonary effort is normal.     Breath sounds: Normal breath sounds. No wheezing or rales.  Chest:     Chest wall: Tenderness (along inferior right ribs) present.  Abdominal:     General: Bowel sounds are normal. There is no distension.     Palpations: Abdomen is soft.     Tenderness: There is no abdominal tenderness. There is no  right CVA tenderness or left CVA tenderness.  Skin:    General: Skin is warm and dry.  Neurological:     Mental Status: She is alert and oriented to person, place, and time.  Results for orders placed or performed in visit on 04/12/19 (from the past 24 hour(s))  POCT urinalysis dipstick     Status: Abnormal   Collection Time: 04/12/19  4:25 PM  Result Value Ref Range   Color, UA yellow yellow   Clarity, UA clear clear   Glucose, UA negative negative mg/dL   Bilirubin, UA negative negative   Ketones, POC UA negative negative mg/dL   Spec Grav, UA 1.025 1.010 - 1.025   Blood, UA small (A) negative   pH, UA 7.5 5.0 - 8.0   Protein Ur, POC negative negative mg/dL   Urobilinogen, UA 1.0 0.2 or 1.0 E.U./dL   Nitrite, UA Negative Negative   Leukocytes, UA Negative Negative  POCT Microscopic Urinalysis ([facility])     Status: Abnormal   Collection Time: 04/12/19  4:48 PM  Result Value Ref Range   WBC,UR,HPF,POC None None WBC/hpf   RBC,UR,HPF,POC Moderate (A) None RBC/hpf   Bacteria None None, Too numerous to count   Mucus Absent Absent   Epithelial Cells, UR Per Microscopy Few (A) None, Too numerous to count cells/hpf    No results found.   ASSESSMENT and PLAN  1. Rib pain on right side Chronic. Discussed supportive measures. Trial of diclofenac gel.   2. Need for prophylactic vaccination and inoculation against influenza - Flu Vaccine QUAD High Dose(Fluad)  3. Hematuria, unspecified type 4. Urinary urgency On xeralto, microscopic. Urine cx pending. If neg, then refer to urology - POCT Microscopic Urinalysis ([facility]) - Urine Culture  Other orders - diclofenac sodium (VOLTAREN) 1 % GEL; Apply 2 g topically 4 (four) times daily.  Return for as scheduled.    Rutherford Guys, MD Primary [facility] at [place], [place] [address] Ph.  [phone] Fax [phone]

## 2019-04-14 LAB — URINE CULTURE

## 2019-04-16 ENCOUNTER — Other Ambulatory Visit: Payer: Self-pay | Admitting: Family Medicine

## 2019-04-16 DIAGNOSIS — R319 Hematuria, unspecified: Secondary | ICD-10-CM

## 2019-04-16 MED ORDER — AMOXICILLIN 500 MG PO CAPS: 500.0000 mg | ORAL_CAPSULE | Freq: Three times a day (TID) | ORAL | 0 refills | Status: DC

## 2019-04-20 ENCOUNTER — Other Ambulatory Visit: Payer: Self-pay | Admitting: Family Medicine

## 2019-04-20 DIAGNOSIS — E119 Type 2 diabetes mellitus without complications: Secondary | ICD-10-CM

## 2019-05-04 ENCOUNTER — Ambulatory Visit (INDEPENDENT_AMBULATORY_CARE_PROVIDER_SITE_OTHER): Payer: Medicare HMO

## 2019-05-04 ENCOUNTER — Encounter: Payer: Self-pay | Admitting: Family Medicine

## 2019-05-04 ENCOUNTER — Ambulatory Visit (INDEPENDENT_AMBULATORY_CARE_PROVIDER_SITE_OTHER): Payer: Medicare HMO | Admitting: Family Medicine

## 2019-05-04 ENCOUNTER — Other Ambulatory Visit: Payer: Self-pay

## 2019-05-04 VITALS — BP 140/90 | HR 66 | Temp 99.4°F | Ht 60.0 in | Wt 188.0 lb

## 2019-05-04 DIAGNOSIS — R3129 Other microscopic hematuria: Secondary | ICD-10-CM

## 2019-05-04 DIAGNOSIS — M546 Pain in thoracic spine: Secondary | ICD-10-CM | POA: Diagnosis not present

## 2019-05-04 DIAGNOSIS — R0789 Other chest pain: Secondary | ICD-10-CM | POA: Diagnosis not present

## 2019-05-04 DIAGNOSIS — G8929 Other chronic pain: Secondary | ICD-10-CM

## 2019-05-04 DIAGNOSIS — R0781 Pleurodynia: Secondary | ICD-10-CM

## 2019-05-04 DIAGNOSIS — M47814 Spondylosis without myelopathy or radiculopathy, thoracic region: Secondary | ICD-10-CM | POA: Diagnosis not present

## 2019-05-04 NOTE — Patient Instructions (Signed)
° ° ° °  If you have lab work done today you will be contacted with your lab results within the next 2 weeks.  If you have not heard from us then please contact us. The fastest way to get your results is to register for My Chart. ° ° °IF you received an x-ray today, you will receive an invoice from [facility]. Please contact  Radiology at [phone] with questions or concerns regarding your invoice.  ° °IF you received labwork today, you will receive an invoice from [facility]. Please contact [facility] at [phone] with questions or concerns regarding your invoice.  ° °Our billing staff will not be able to assist you with questions regarding bills from these companies. ° °You will be contacted with the lab results as soon as they are available. The fastest way to get your results is to activate your My Chart account. Instructions are located on the last page of this paperwork. If you have not heard from us regarding the results in 2 weeks, please contact this office. °  ° ° ° °

## 2019-05-04 NOTE — Progress Notes (Signed)
11/13/20203:38 PM  Catherine Joseph 08-17-1940, 78 y.o., female [id number]  Chief Complaint  Patient presents with  . Pain    pain on right side that is not getting better    HPI:   Patient is a 78 y.o. female with past medical history significant for HTN, DM2, afib on NOAC, HLP who presents today for right rib pain  Seen 2-3 weeks ago Chronic right mid back and chest wall pain Pain is constant, sharp stabbing, worse with movement, laughing Does not interfer with sleep Getting worse, specially in her back Radiates towards her right lower ribs/chest No numbness or tingling Does not radiate down her legs diclofenac gel does not help  Recently treated for UTI Gross hematuria resolved  Depression screen [facility] 2/9 05/04/2019 04/12/2019 03/05/2019  Decreased Interest 0 0 0  Down, Depressed, Hopeless 0 0 0  PHQ - 2 Score 0 0 0    Fall Risk  05/04/2019 04/12/2019 03/05/2019 02/15/2019 02/06/2019  Falls in the past year? 0 0 0 0 0  Number falls in past yr: 0 0 0 0 0  Injury with Fall? 0 0 0 0 0  Comment - - - - -  Risk for fall due to : - Other (Comment) - - -  Risk for fall due to: Comment - geriatric pt - - -  Follow up - - Falls evaluation completed;Education provided;Falls prevention discussed - -     Allergies  Allergen Reactions  . Codeine Other (See Comments)    vertigo    Prior to Admission medications   Medication Sig Start Date End Date Taking? Authorizing Provider  acetaminophen (TYLENOL) 500 MG tablet Take 1 tablet (500 mg total) by mouth every 6 (six) hours as needed. 11/19/16  Yes Shawnee Knapp, MD  Alcohol Swabs (B-D SINGLE USE SWABS REGULAR) PADS USE TO CHECK BLOOD SUGAR DAILY  06/03/17  Yes Shawnee Knapp, MD  amoxicillin (AMOXIL) 500 MG capsule Take 1 capsule (500 mg total) by mouth 3 (three) times daily. 04/16/19  Yes Rutherford Guys, MD  Blood Glucose Calibration (SURECHEK CONTROL SOLUTION) Normal LIQD Use to check blood sugar daily. Dx E11.65 09/08/15  Yes  Shawnee Knapp, MD  blood glucose meter kit and supplies Use to test blood sugar once daily. Dx: E11.65 02/27/18  Yes Shawnee Knapp, MD  Blood Glucose Monitoring Suppl (TRUE METRIX AIR GLUCOSE METER) DEVI 1 each by Does not apply route daily. Use to test blood sugar once daily. Dx: E11.65 09/09/15  Yes Ivar Drape D, PA  Calcium-Magnesium-Vitamin D (CALCIUM 500 PO) Take 500 mg by mouth daily.   Yes [provider]  cloNIDine (CATAPRES) 0.1 MG tablet TAKE 1 TABLET AT BEDTIME 04/20/19  Yes Rutherford Guys, MD  diclofenac sodium (VOLTAREN) 1 % GEL Apply 2 g topically 4 (four) times daily. 04/12/19  Yes Rutherford Guys, MD  flecainide (TAMBOCOR) 100 MG tablet TAKE 1/2 TABLET (50 MG) BY MOUTH 2 TIMES DAILY 10/13/18  Yes Josue Hector, MD  glipiZIDE (GLUCOTROL) 10 MG tablet TAKE 1 TABLET TWICE DAILY BEFORE A MEAL 04/20/19  Yes Rutherford Guys, MD  irbesartan-hydrochlorothiazide (AVALIDE) 300-12.5 MG tablet TAKE 1 TABLET EVERY DAY 04/20/19  Yes Rutherford Guys, MD  metFORMIN (GLUCOPHAGE-XR) 500 MG 24 hr tablet TAKE 2 TABLETS DAILY WITH BREAKFAST 04/20/19  Yes Rutherford Guys, MD  nystatin cream (MYCOSTATIN) APPLY EXTERNALLY TO THE AFFECTED AREA TWICE DAILY 04/10/19  Yes Rutherford Guys, MD  [facility]  0.7 % SOLN Place 1 drop into both eyes daily. 05/24/16  Yes [provider]  polyethylene glycol powder (GLYCOLAX/MIRALAX) powder Take 17 g by mouth 2 (two) times daily as needed. 11/19/16  Yes Shawnee Knapp, MD  rivaroxaban (XARELTO) 20 MG TABS tablet Take 1 tablet (20 mg total) by mouth daily with supper. 08/31/18  Yes Josue Hector, MD  simvastatin (ZOCOR) 20 MG tablet TAKE 1 TABLET EVERY DAY  AT  6PM 10/27/18  Yes Rutherford Guys, MD  TRUE METRIX BLOOD GLUCOSE TEST test strip TEST BLOOD SUGAR ONE TIME DAILY 06/03/17  Yes Shawnee Knapp, MD  TRUEPLUS LANCETS 28G MISC USE TO TEST BLOOD SUGAR ONCE DAILY. 06/03/17  Yes Shawnee Knapp, MD    Past Medical History:  Diagnosis Date  . Anemia   .  Arthritis   . Bradycardia   . Cataract   . Chronic kidney disease   . Diabetes mellitus type II   . Hypertension   . Osteoporosis   . Syncope   . Weakness     Past Surgical History:  Procedure Laterality Date  . ABDOMINAL HYSTERECTOMY    . CESAREAN SECTION    . COLON SURGERY    . EYE SURGERY    . FRACTURE SURGERY      Social History   Tobacco Use  . Smoking status: Never Smoker  . Smokeless tobacco: Never Used  Substance Use Topics  . Alcohol use: No    Family History  Problem Relation Age of Onset  . Diabetes Sister   . Hyperlipidemia Sister   . Hypertension Sister   . Diabetes Brother   . Hyperlipidemia Brother   . Hypertension Brother     ROS Per hpi  OBJECTIVE:  LFYBO'F BPZWCH   05/04/19 1501  BP: 140/90  Pulse: 66  Temp: 99.4 F (37.4 C)  SpO2: 97%  Weight: 188 lb (85.3 kg)  Height: 5' (1.524 m)   Body mass index is 36.72 kg/m.   Physical Exam Vitals signs and nursing note reviewed.  Constitutional:      Appearance: She is well-developed.  HENT:     Head: Normocephalic and atraumatic.     Mouth/Throat:     Pharynx: No oropharyngeal exudate.  Eyes:     General: No scleral icterus.    Conjunctiva/sclera: Conjunctivae normal.     Pupils: Pupils are equal, round, and reactive to light.  Neck:     Musculoskeletal: Neck supple.  Cardiovascular:     Rate and Rhythm: Normal rate and regular rhythm.     Heart sounds: Normal heart sounds. No murmur. No friction rub. No gallop.   Pulmonary:     Effort: Pulmonary effort is normal.     Breath sounds: Normal breath sounds. No wheezing or rales.  Chest:     Chest wall: Tenderness (along inferior right ribs) present.  Abdominal:     General: Bowel sounds are normal. There is no distension.     Palpations: Abdomen is soft.     Tenderness: There is no abdominal tenderness. There is no right CVA tenderness or left CVA tenderness.  Musculoskeletal:     Cervical back: Normal.     Thoracic back:  She exhibits tenderness (right paraspinal) and bony tenderness. She exhibits normal range of motion, no swelling and no spasm.  Skin:    General: Skin is warm and dry.  Neurological:     Mental Status: She is alert and oriented to person, place, and time.  No results found for this or any previous visit (from the past 24 hour(s)).  Dg Ribs Unilateral Right  Result Date: 05/04/2019 CLINICAL DATA:  Chronic right chest wall pain EXAM: THORACIC SPINE 2 VIEWS; RIGHT RIBS - 2 VIEW COMPARISON:  09/25/2016, 11/19/2016 FINDINGS: There is no evidence of thoracic spine fracture. Alignment is maintained without static listhesis. Multilevel degenerative changes with multiple bridging osteophytes in the mid to lower thoracic spine. Chronic compression deformity of L1. Dedicated views of the right ribs demonstrate no acute fracture or focal bone lesion. Lungs are clear. No pneumothorax. IMPRESSION: 1. No acute osseous abnormality of the thoracic spine and right ribs. 2. Chronic L1 compression deformity. 3. Multilevel degenerative changes of the thoracic spine. Electronically Signed   By: Davina Poke M.D.   On: 05/04/2019 15:34   Dg Thoracic Spine 2 View  Result Date: 05/04/2019 CLINICAL DATA:  Chronic right chest wall pain EXAM: THORACIC SPINE 2 VIEWS; RIGHT RIBS - 2 VIEW COMPARISON:  09/25/2016, 11/19/2016 FINDINGS: There is no evidence of thoracic spine fracture. Alignment is maintained without static listhesis. Multilevel degenerative changes with multiple bridging osteophytes in the mid to lower thoracic spine. Chronic compression deformity of L1. Dedicated views of the right ribs demonstrate no acute fracture or focal bone lesion. Lungs are clear. No pneumothorax. IMPRESSION: 1. No acute osseous abnormality of the thoracic spine and right ribs. 2. Chronic L1 compression deformity. 3. Multilevel degenerative changes of the thoracic spine. Electronically Signed   By: Davina Poke M.D.   On:  05/04/2019 15:34     ASSESSMENT and PLAN  1. Chronic right-sided thoracic back pain Worsening, xray shows DDD, unable to do oral NSAIDs due to anticoagulant. Declined rx for tramadol. Interested in interventional options if available.  - Ambulatory referral to Physical Medicine Rehab  2. Rib pain on right side - DG Ribs Unilateral Right; Future - DG Thoracic Spine 2 View; Future - Ambulatory referral to Physical Medicine Rehab  3. Microhematuria - Urinalysis, Routine w reflex microscopic  Return in about 3 months (around 08/04/2019) for DM.    Rutherford Guys, MD Primary Care at [place], [place] [address] Ph.  [phone] Fax [phone]

## 2019-05-05 LAB — URINALYSIS, ROUTINE W REFLEX MICROSCOPIC
Bilirubin, UA: NEGATIVE
Glucose, UA: NEGATIVE
Nitrite, UA: NEGATIVE
Specific Gravity, UA: 1.024 (ref 1.005–1.030)
Urobilinogen, Ur: 1 mg/dL (ref 0.2–1.0)
pH, UA: 6.5 (ref 5.0–7.5)

## 2019-05-05 LAB — MICROSCOPIC EXAMINATION
Bacteria, UA: NONE SEEN
Casts: NONE SEEN /lpf

## 2019-05-08 NOTE — Addendum Note (Signed)
Addended by: Rutherford Guys on: 05/08/2019 05:42 PM   Modules accepted: Orders

## 2019-05-24 ENCOUNTER — Ambulatory Visit (INDEPENDENT_AMBULATORY_CARE_PROVIDER_SITE_OTHER): Payer: Medicare HMO | Admitting: Family Medicine

## 2019-05-24 ENCOUNTER — Other Ambulatory Visit: Payer: Self-pay

## 2019-05-24 ENCOUNTER — Encounter: Payer: Self-pay | Admitting: Family Medicine

## 2019-05-24 ENCOUNTER — Ambulatory Visit (INDEPENDENT_AMBULATORY_CARE_PROVIDER_SITE_OTHER): Payer: Medicare HMO

## 2019-05-24 VITALS — BP 149/83 | HR 63 | Temp 97.0°F | Ht 60.0 in | Wt 185.0 lb

## 2019-05-24 DIAGNOSIS — E119 Type 2 diabetes mellitus without complications: Secondary | ICD-10-CM

## 2019-05-24 DIAGNOSIS — R319 Hematuria, unspecified: Secondary | ICD-10-CM | POA: Diagnosis not present

## 2019-05-24 DIAGNOSIS — R109 Unspecified abdominal pain: Secondary | ICD-10-CM

## 2019-05-24 DIAGNOSIS — K5900 Constipation, unspecified: Secondary | ICD-10-CM | POA: Diagnosis not present

## 2019-05-24 MED ORDER — TRAMADOL HCL 50 MG PO TABS: 50.0000 mg | ORAL_TABLET | Freq: Two times a day (BID) | ORAL | 0 refills | Status: DC | PRN

## 2019-05-24 MED ORDER — POLYETHYLENE GLYCOL 3350 17 GM/SCOOP PO POWD: 17.0000 g | Freq: Two times a day (BID) | ORAL | 3 refills | Status: DC | PRN

## 2019-05-24 NOTE — Patient Instructions (Signed)
° ° ° °  If you have lab work done today you will be contacted with your lab results within the next 2 weeks.  If you have not heard from us then please contact us. The fastest way to get your results is to register for My Chart. ° ° °IF you received an x-ray today, you will receive an invoice from [facility]. Please contact [facility] at [phone] with questions or concerns regarding your invoice.  ° °IF you received labwork today, you will receive an invoice from [facility]. Please contact [facility] at [phone] with questions or concerns regarding your invoice.  ° °Our billing staff will not be able to assist you with questions regarding bills from these companies. ° °You will be contacted with the lab results as soon as they are available. The fastest way to get your results is to activate your My Chart account. Instructions are located on the last page of this paperwork. If you have not heard from us regarding the results in 2 weeks, please contact this office. °  ° ° ° °

## 2019-05-24 NOTE — Progress Notes (Signed)
12/3/20204:35 PM  Catherine Joseph 09-30-1940, 78 y.o., female [id number]  Chief Complaint  Patient presents with  . Pain    still having pain in the back, referral is still pending for pt    HPI:   Patient is a 78 y.o. female with past medical history significant for HTN, DM2, afib on NOAC, HLPwho presents today for right rib pain  Seen 2 weeks ago for same issue Declined tramadol open to it today However today patient further clarifies that while her right rib pain has been present for over a year, her right flank pain is more recent, last 1-2 months She feels that pain travels/warps around her right side Denies any dysuria or gross hematuria She did have WBCs and RBCs in urine last OV She denies any h/o kidney stones She does report seeing urology in the past for blood in her urine, neg workup   Depression screen [facility] 2/9 05/24/2019 05/04/2019 04/12/2019  Decreased Interest 0 0 0  Down, Depressed, Hopeless 0 0 0  PHQ - 2 Score 0 0 0    Fall Risk  05/24/2019 05/04/2019 04/12/2019 03/05/2019 02/15/2019  Falls in the past year? 0 0 0 0 0  Number falls in past yr: 0 0 0 0 0  Injury with Fall? 0 0 0 0 0  Comment - - - - -  Risk for fall due to : - - Other (Comment) - -  Risk for fall due to: Comment - - geriatric pt - -  Follow up - - - Falls evaluation completed;Education provided;Falls prevention discussed -     Allergies  Allergen Reactions  . Codeine Other (See Comments)    vertigo    Prior to Admission medications   Medication Sig Start Date End Date Taking? Authorizing Provider  acetaminophen (TYLENOL) 500 MG tablet Take 1 tablet (500 mg total) by mouth every 6 (six) hours as needed. 11/19/16  Yes Shawnee Knapp, MD  Alcohol Swabs (B-D SINGLE USE SWABS REGULAR) PADS USE TO CHECK BLOOD SUGAR DAILY  06/03/17  Yes Shawnee Knapp, MD  amoxicillin (AMOXIL) 500 MG capsule Take 1 capsule (500 mg total) by mouth 3 (three) times daily. 04/16/19  Yes Rutherford Guys, MD   Blood Glucose Calibration (SURECHEK CONTROL SOLUTION) Normal LIQD Use to check blood sugar daily. Dx E11.65 09/08/15  Yes Shawnee Knapp, MD  blood glucose meter kit and supplies Use to test blood sugar once daily. Dx: E11.65 02/27/18  Yes Shawnee Knapp, MD  Blood Glucose Monitoring Suppl (TRUE METRIX AIR GLUCOSE METER) DEVI 1 each by Does not apply route daily. Use to test blood sugar once daily. Dx: E11.65 09/09/15  Yes Ivar Drape D, PA  Calcium-Magnesium-Vitamin D (CALCIUM 500 PO) Take 500 mg by mouth daily.   Yes [provider]  cloNIDine (CATAPRES) 0.1 MG tablet TAKE 1 TABLET AT BEDTIME 04/20/19  Yes Rutherford Guys, MD  diclofenac sodium (VOLTAREN) 1 % GEL Apply 2 g topically 4 (four) times daily. 04/12/19  Yes Rutherford Guys, MD  flecainide (TAMBOCOR) 100 MG tablet TAKE 1/2 TABLET (50 MG) BY MOUTH 2 TIMES DAILY 10/13/18  Yes Josue Hector, MD  glipiZIDE (GLUCOTROL) 10 MG tablet TAKE 1 TABLET TWICE DAILY BEFORE A MEAL 04/20/19  Yes Rutherford Guys, MD  irbesartan-hydrochlorothiazide (AVALIDE) 300-12.5 MG tablet TAKE 1 TABLET EVERY DAY 04/20/19  Yes Rutherford Guys, MD  metFORMIN (GLUCOPHAGE-XR) 500 MG 24 hr tablet TAKE 2 TABLETS DAILY WITH  BREAKFAST 04/20/19  Yes Rutherford Guys, MD  nystatin cream (MYCOSTATIN) APPLY EXTERNALLY TO THE AFFECTED AREA TWICE DAILY 04/10/19  Yes Rutherford Guys, MD  PAZEO 0.7 % SOLN Place 1 drop into both eyes daily. 05/24/16  Yes [provider]  polyethylene glycol powder (GLYCOLAX/MIRALAX) powder Take 17 g by mouth 2 (two) times daily as needed. 11/19/16  Yes Shawnee Knapp, MD  rivaroxaban (XARELTO) 20 MG TABS tablet Take 1 tablet (20 mg total) by mouth daily with supper. 08/31/18  Yes Josue Hector, MD  simvastatin (ZOCOR) 20 MG tablet TAKE 1 TABLET EVERY DAY  AT  6PM 10/27/18  Yes Rutherford Guys, MD  TRUE METRIX BLOOD GLUCOSE TEST test strip TEST BLOOD SUGAR ONE TIME DAILY 06/03/17  Yes Shawnee Knapp, MD  TRUEPLUS LANCETS 28G MISC USE TO  TEST BLOOD SUGAR ONCE DAILY. 06/03/17  Yes Shawnee Knapp, MD    Past Medical History:  Diagnosis Date  . Anemia   . Arthritis   . Bradycardia   . Cataract   . Chronic kidney disease   . Diabetes mellitus type II   . Hypertension   . Osteoporosis   . Syncope   . Weakness     Past Surgical History:  Procedure Laterality Date  . ABDOMINAL HYSTERECTOMY    . CESAREAN SECTION    . COLON SURGERY    . EYE SURGERY    . FRACTURE SURGERY      Social History   Tobacco Use  . Smoking status: Never Smoker  . Smokeless tobacco: Never Used  Substance Use Topics  . Alcohol use: No    Family History  Problem Relation Age of Onset  . Diabetes Sister   . Hyperlipidemia Sister   . Hypertension Sister   . Diabetes Brother   . Hyperlipidemia Brother   . Hypertension Brother     ROS Per hpi  OBJECTIVE:  Today's Vitals   05/24/19 1629  BP: (!) 149/83  Pulse: 63  Temp: (!) 97 F (36.1 C)  SpO2: 100%  Weight: 185 lb (83.9 kg)  Height: 5' (1.524 m)   Body mass index is 36.13 kg/m.  BP Readings from Last 3 Encounters:  05/24/19 (!) 149/83  05/04/19 140/90  04/12/19 132/70    Physical Exam Vitals signs and nursing note reviewed.  Constitutional:      Appearance: She is well-developed.  HENT:     Head: Normocephalic and atraumatic.  Eyes:     General: No scleral icterus.    Conjunctiva/sclera: Conjunctivae normal.     Pupils: Pupils are equal, round, and reactive to light.  Neck:     Musculoskeletal: Neck supple.  Pulmonary:     Effort: Pulmonary effort is normal.  Skin:    General: Skin is warm and dry.  Neurological:     Mental Status: She is alert and oriented to person, place, and time.     No results found for this or any previous visit (from the past 24 hour(s)).  Dg Abd 1 View  Result Date: 05/24/2019 CLINICAL DATA:  Flank pain with hematuria. EXAM: ABDOMEN - 1 VIEW COMPARISON:  CT 03/01/2017 FINDINGS: No bowel dilatation to suggest obstruction.  Moderate colonic stool burden. No evidence of calcifications over the renal beds, ureters, or urinary bladder, partially obscured by large stool burden. Calcification in the right pelvis is vascular as seen on prior CT. No acute osseous abnormalities. Remote left inferior pubic ramus fracture. The lung bases are clear.  IMPRESSION: 1. No evidence of urolithiasis. 2. Moderate colonic stool burden, no bowel obstruction. Electronically Signed   By: Keith Rake M.D.   On: 05/24/2019 17:24     ASSESSMENT and PLAN  1. Right flank pain Suspect 2/2 DDD. Trial of tramadol prn for severe pain, otherwise cont with APAP and diclofenac gel. Patient will call if she would like a referral to [facility], whom she has seen in the past.  - DG Abd 1 View; Future  2. Constipation, unspecified constipation type Discussed supportive measures, miralax titration. RTC precautions reviewed  3. Type 2 diabetes mellitus without complication, without long-term current use of insulin (HCC) - Lipid panel - CMET with GFR - Hemoglobin A1c  Other orders - traMADol (ULTRAM) 50 MG tablet; Take 1 tablet (50 mg total) by mouth every 12 (twelve) hours as needed. - polyethylene glycol powder (GLYCOLAX/MIRALAX) 17 GM/SCOOP powder; Take 17 g by mouth 2 (two) times daily as needed.  Return for as scheduled.    Rutherford Guys, MD Primary Care at [place], [place] [address] Ph.  [phone] Fax [phone]

## 2019-05-25 LAB — CMP14+EGFR
ALT: 10 IU/L (ref 0–32)
AST: 18 IU/L (ref 0–40)
Albumin/Globulin Ratio: 1.2 (ref 1.2–2.2)
Albumin: 4.4 g/dL (ref 3.7–4.7)
Alkaline Phosphatase: 111 IU/L (ref 39–117)
BUN/Creatinine Ratio: 18 (ref 12–28)
BUN: 19 mg/dL (ref 8–27)
Bilirubin Total: 0.3 mg/dL (ref 0.0–1.2)
CO2: 22 mmol/L (ref 20–29)
Calcium: 9.3 mg/dL (ref 8.7–10.3)
Chloride: 103 mmol/L (ref 96–106)
Creatinine, Ser: 1.05 mg/dL — ABNORMAL HIGH (ref 0.57–1.00)
GFR calc Af Amer: 58 mL/min/{1.73_m2} — ABNORMAL LOW (ref >59)
GFR calc non Af Amer: 50 mL/min/{1.73_m2} — ABNORMAL LOW (ref >59)
Globulin, Total: 3.7 g/dL (ref 1.5–4.5)
Glucose: 127 mg/dL — ABNORMAL HIGH (ref 65–99)
Potassium: 4.1 mmol/L (ref 3.5–5.2)
Sodium: 142 mmol/L (ref 134–144)
Total Protein: 8.1 g/dL (ref 6.0–8.5)

## 2019-05-25 LAB — LIPID PANEL
Chol/HDL Ratio: 2.7 ratio (ref 0.0–4.4)
Cholesterol, Total: 172 mg/dL (ref 100–199)
HDL: 63 mg/dL (ref >39)
LDL Chol Calc (NIH): 85 mg/dL (ref 0–99)
Triglycerides: 137 mg/dL (ref 0–149)
VLDL Cholesterol Cal: 24 mg/dL (ref 5–40)

## 2019-05-25 LAB — HEMOGLOBIN A1C

## 2019-06-18 ENCOUNTER — Telehealth: Payer: Self-pay | Admitting: Family Medicine

## 2019-06-18 DIAGNOSIS — E119 Type 2 diabetes mellitus without complications: Secondary | ICD-10-CM

## 2019-06-18 NOTE — Telephone Encounter (Signed)
I have given the pt a call back with no answer. I left a message to call back schedule a lab only visit for her A1c Check. I have placed the order for future labs. If the pt calls back please assist with scheduling the lab only visit.   Thanks, [employer]

## 2019-06-18 NOTE — Telephone Encounter (Signed)
Pt called back and got scheduled

## 2019-06-18 NOTE — Telephone Encounter (Signed)
Pt was seen on 12/3 and did not get labs for her a1c . She would like to come in although it looks like her lab order was cancelled   Please advise

## 2019-06-19 ENCOUNTER — Telehealth: Payer: Self-pay

## 2019-06-19 ENCOUNTER — Ambulatory Visit (INDEPENDENT_AMBULATORY_CARE_PROVIDER_SITE_OTHER): Payer: Medicare HMO | Admitting: Family Medicine

## 2019-06-19 ENCOUNTER — Other Ambulatory Visit: Payer: Self-pay

## 2019-06-19 DIAGNOSIS — R319 Hematuria, unspecified: Secondary | ICD-10-CM | POA: Diagnosis not present

## 2019-06-19 DIAGNOSIS — E119 Type 2 diabetes mellitus without complications: Secondary | ICD-10-CM

## 2019-06-19 MED ORDER — NYSTATIN 100000 UNIT/GM EX CREA: TOPICAL_CREAM | CUTANEOUS | 1 refills | Status: DC

## 2019-06-19 MED ORDER — DICLOFENAC SODIUM 1 % EX GEL: 2.0000 g | Freq: Four times a day (QID) | CUTANEOUS | 3 refills | Status: DC

## 2019-06-19 NOTE — Telephone Encounter (Signed)
Pt is requesting her nystatin and voltaren cream. The voltaren is requiring new rx. Pharmacy has already been updated for pt to pick up today.

## 2019-06-20 LAB — HEMOGLOBIN A1C
Est. average glucose Bld gHb Est-mCnc: 131 mg/dL
Hgb A1c MFr Bld: 6.2 % — ABNORMAL HIGH (ref 4.8–5.6)

## 2019-06-20 LAB — MICROSCOPIC EXAMINATION: Casts: NONE SEEN /lpf

## 2019-06-20 LAB — URINALYSIS, ROUTINE W REFLEX MICROSCOPIC
Bilirubin, UA: NEGATIVE
Glucose, UA: NEGATIVE
Ketones, UA: NEGATIVE
Leukocytes,UA: NEGATIVE
Nitrite, UA: NEGATIVE
Protein,UA: NEGATIVE
Specific Gravity, UA: 1.02 (ref 1.005–1.030)
Urobilinogen, Ur: 0.2 mg/dL (ref 0.2–1.0)
pH, UA: 6 (ref 5.0–7.5)

## 2019-06-25 ENCOUNTER — Other Ambulatory Visit: Payer: Self-pay | Admitting: Family Medicine

## 2019-06-25 DIAGNOSIS — R319 Hematuria, unspecified: Secondary | ICD-10-CM

## 2019-07-19 DIAGNOSIS — E113411 Type 2 diabetes mellitus with severe nonproliferative diabetic retinopathy with macular edema, right eye: Secondary | ICD-10-CM | POA: Diagnosis not present

## 2019-07-19 DIAGNOSIS — H35351 Cystoid macular degeneration, right eye: Secondary | ICD-10-CM | POA: Diagnosis not present

## 2019-07-23 ENCOUNTER — Other Ambulatory Visit: Payer: Self-pay | Admitting: Cardiovascular Disease

## 2019-07-23 DIAGNOSIS — H34831 Tributary (branch) retinal vein occlusion, right eye, with macular edema: Secondary | ICD-10-CM | POA: Diagnosis not present

## 2019-07-23 DIAGNOSIS — H35372 Puckering of macula, left eye: Secondary | ICD-10-CM | POA: Diagnosis not present

## 2019-07-23 DIAGNOSIS — I480 Paroxysmal atrial fibrillation: Secondary | ICD-10-CM

## 2019-07-23 DIAGNOSIS — E119 Type 2 diabetes mellitus without complications: Secondary | ICD-10-CM | POA: Diagnosis not present

## 2019-07-30 DIAGNOSIS — M545 Low back pain: Secondary | ICD-10-CM | POA: Diagnosis not present

## 2019-07-30 DIAGNOSIS — R3129 Other microscopic hematuria: Secondary | ICD-10-CM | POA: Diagnosis not present

## 2019-07-31 DIAGNOSIS — H34831 Tributary (branch) retinal vein occlusion, right eye, with macular edema: Secondary | ICD-10-CM | POA: Diagnosis not present

## 2019-08-01 LAB — HM DIABETES EYE EXAM

## 2019-08-06 ENCOUNTER — Other Ambulatory Visit: Payer: Self-pay

## 2019-08-06 ENCOUNTER — Ambulatory Visit (INDEPENDENT_AMBULATORY_CARE_PROVIDER_SITE_OTHER): Payer: Medicare HMO | Admitting: Family Medicine

## 2019-08-06 ENCOUNTER — Encounter: Payer: Self-pay | Admitting: Family Medicine

## 2019-08-06 VITALS — BP 112/86 | HR 69 | Temp 97.7°F | Ht 60.0 in | Wt 187.0 lb

## 2019-08-06 DIAGNOSIS — E119 Type 2 diabetes mellitus without complications: Secondary | ICD-10-CM

## 2019-08-06 DIAGNOSIS — R3129 Other microscopic hematuria: Secondary | ICD-10-CM | POA: Diagnosis not present

## 2019-08-06 DIAGNOSIS — I10 Essential (primary) hypertension: Secondary | ICD-10-CM | POA: Diagnosis not present

## 2019-08-06 DIAGNOSIS — Z7189 Other specified counseling: Secondary | ICD-10-CM

## 2019-08-06 NOTE — Patient Instructions (Signed)
° ° ° °  If you have lab work done today you will be contacted with your lab results within the next 2 weeks.  If you have not heard from us then please contact us. The fastest way to get your results is to register for My Chart. ° ° °IF you received an x-ray today, you will receive an invoice from [facility]. Please contact [facility] at [phone] with questions or concerns regarding your invoice.  ° °IF you received labwork today, you will receive an invoice from [facility]. Please contact [facility] at [phone] with questions or concerns regarding your invoice.  ° °Our billing staff will not be able to assist you with questions regarding bills from these companies. ° °You will be contacted with the lab results as soon as they are available. The fastest way to get your results is to activate your My Chart account. Instructions are located on the last page of this paperwork. If you have not heard from us regarding the results in 2 weeks, please contact this office. °  ° ° ° °

## 2019-08-06 NOTE — Progress Notes (Signed)
2/15/20214:22 PM  Catherine Joseph 11-Mar-1941, 79 y.o., female [id number]  Chief Complaint  Patient presents with  . Diabetes    monitors bg at home, numbers have been really good    HPI:   Patient is a 79 y.o. female with past medical history significant for  HTN, DM2, afibon NOAC, HLP, chronic right rib pain/DDDwho presents today for routine followup  Last OV Dec 2020 - trial of tramadol, referred to urology for hematuria  Has seen urologist and has ct scan scheduled for 24th of this month  Has had injection into right eye due to retinopathy  She reports doing well She has no acute concerns today She is taking meds as prescribed Is not taking tramadol as not helping with her right flank pain She has questions re covid vaccine  Lab Results  Component Value Date   HGBA1C 6.2 (H) 06/19/2019   HGBA1C CANCELED 05/24/2019   HGBA1C 6.6 (H) 11/22/2018   Lab Results  Component Value Date   MICROALBUR 2.0 08/28/2015   LDLCALC 85 05/24/2019   CREATININE 1.05 (H) 05/24/2019    Depression screen PHQ 2/9 08/06/2019 05/24/2019 05/04/2019  Decreased Interest 0 0 0  Down, Depressed, Hopeless 0 0 0  PHQ - 2 Score 0 0 0    Fall Risk  08/06/2019 05/24/2019 05/04/2019 04/12/2019 03/05/2019  Falls in the past year? 0 0 0 0 0  Number falls in past yr: 0 0 0 0 0  Injury with Fall? 0 0 0 0 0  Comment - - - - -  Risk for fall due to : - - - Other (Comment) -  Risk for fall due to: Comment - - - geriatric pt -  Follow up - - - - Falls evaluation completed;Education provided;Falls prevention discussed     Allergies  Allergen Reactions  . Codeine Other (See Comments)    vertigo    Prior to Admission medications   Medication Sig Start Date End Date Taking? Authorizing Provider  acetaminophen (TYLENOL) 500 MG tablet Take 1 tablet (500 mg total) by mouth every 6 (six) hours as needed. 11/19/16  Yes Shawnee Knapp, MD  Alcohol Swabs (B-D SINGLE USE SWABS REGULAR) PADS USE TO CHECK  BLOOD SUGAR DAILY  06/03/17  Yes Shawnee Knapp, MD  amoxicillin (AMOXIL) 500 MG capsule Take 1 capsule (500 mg total) by mouth 3 (three) times daily. 04/16/19  Yes Rutherford Guys, MD  Blood Glucose Calibration (SURECHEK CONTROL SOLUTION) Normal LIQD Use to check blood sugar daily. Dx E11.65 09/08/15  Yes Shawnee Knapp, MD  blood glucose meter kit and supplies Use to test blood sugar once daily. Dx: E11.65 02/27/18  Yes Shawnee Knapp, MD  Blood Glucose Monitoring Suppl (TRUE METRIX AIR GLUCOSE METER) DEVI 1 each by Does not apply route daily. Use to test blood sugar once daily. Dx: E11.65 09/09/15  Yes Ivar Drape D, PA  Calcium-Magnesium-Vitamin D (CALCIUM 500 PO) Take 500 mg by mouth daily.   Yes [provider]  cloNIDine (CATAPRES) 0.1 MG tablet TAKE 1 TABLET AT BEDTIME 04/20/19  Yes Rutherford Guys, MD  diclofenac Sodium (VOLTAREN) 1 % GEL Apply 2 g topically 4 (four) times daily. 06/19/19  Yes Rutherford Guys, MD  flecainide (TAMBOCOR) 100 MG tablet TAKE 1/2 TABLET TWICE DAILY 07/24/19  Yes Josue Hector, MD  glipiZIDE (GLUCOTROL) 10 MG tablet TAKE 1 TABLET TWICE DAILY BEFORE A MEAL 04/20/19  Yes Rutherford Guys, MD  irbesartan-hydrochlorothiazide (  AVALIDE) 300-12.5 MG tablet TAKE 1 TABLET EVERY DAY 04/20/19  Yes Rutherford Guys, MD  metFORMIN (GLUCOPHAGE-XR) 500 MG 24 hr tablet TAKE 2 TABLETS DAILY WITH BREAKFAST 04/20/19  Yes Rutherford Guys, MD  nystatin cream (MYCOSTATIN) APPLY EXTERNALLY TO THE AFFECTED AREA TWICE DAILY 06/19/19  Yes Rutherford Guys, MD  PAZEO 0.7 % SOLN Place 1 drop into both eyes daily. 05/24/16  Yes [provider]  polyethylene glycol powder (GLYCOLAX/MIRALAX) 17 GM/SCOOP powder Take 17 g by mouth 2 (two) times daily as needed. 05/24/19  Yes Rutherford Guys, MD  polyethylene glycol powder (GLYCOLAX/MIRALAX) powder Take 17 g by mouth 2 (two) times daily as needed. 11/19/16  Yes Shawnee Knapp, MD  rivaroxaban (XARELTO) 20 MG TABS tablet Take 1 tablet (20  mg total) by mouth daily with supper. 08/31/18  Yes Josue Hector, MD  simvastatin (ZOCOR) 20 MG tablet TAKE 1 TABLET EVERY DAY  AT  6PM 10/27/18  Yes Rutherford Guys, MD  traMADol (ULTRAM) 50 MG tablet Take 1 tablet (50 mg total) by mouth every 12 (twelve) hours as needed. 05/24/19  Yes Rutherford Guys, MD  TRUE METRIX BLOOD GLUCOSE TEST test strip TEST BLOOD SUGAR ONE TIME DAILY 06/03/17  Yes Shawnee Knapp, MD  TRUEPLUS LANCETS 28G MISC USE TO TEST BLOOD SUGAR ONCE DAILY. 06/03/17  Yes Shawnee Knapp, MD    Past Medical History:  Diagnosis Date  . Anemia   . Arthritis   . Bradycardia   . Cataract   . Chronic kidney disease   . Diabetes mellitus type II   . Hypertension   . Osteoporosis   . Syncope   . Weakness     Past Surgical History:  Procedure Laterality Date  . ABDOMINAL HYSTERECTOMY    . CESAREAN SECTION    . COLON SURGERY    . EYE SURGERY    . FRACTURE SURGERY      Social History   Tobacco Use  . Smoking status: Never Smoker  . Smokeless tobacco: Never Used  Substance Use Topics  . Alcohol use: No    Family History  Problem Relation Age of Onset  . Diabetes Sister   . Hyperlipidemia Sister   . Hypertension Sister   . Diabetes Brother   . Hyperlipidemia Brother   . Hypertension Brother     Review of Systems  Constitutional: Negative for chills and fever.  Respiratory: Negative for cough and shortness of breath.   Cardiovascular: Negative for chest pain, palpitations and leg swelling.  Gastrointestinal: Negative for abdominal pain, nausea and vomiting.     OBJECTIVE:  Today's Vitals   08/06/19 1621  BP: 112/86  Pulse: 69  Temp: 97.7 F (36.5 C)  SpO2: 98%  Weight: 187 lb (84.8 kg)  Height: 5' (1.524 m)   Body mass index is 36.52 kg/m.   Physical Exam Vitals and nursing note reviewed.  Constitutional:      Appearance: She is well-developed.  HENT:     Head: Normocephalic and atraumatic.     Mouth/Throat:     Pharynx: No oropharyngeal  exudate.  Eyes:     General: No scleral icterus.    Conjunctiva/sclera: Conjunctivae normal.     Pupils: Pupils are equal, round, and reactive to light.  Cardiovascular:     Rate and Rhythm: Normal rate and regular rhythm.     Heart sounds: Normal heart sounds. No murmur. No friction rub. No gallop.   Pulmonary:  Effort: Pulmonary effort is normal.     Breath sounds: Normal breath sounds. No wheezing, rhonchi or rales.  Musculoskeletal:     Cervical back: Neck supple.     Right lower leg: No edema.     Left lower leg: No edema.  Skin:    General: Skin is warm and dry.  Neurological:     Mental Status: She is alert and oriented to person, place, and time.     No results found for this or any previous visit (from the past 24 hour(s)).  No results found.   ASSESSMENT and PLAN  1. Microhematuria In process of being evaluated by urology  2. Essential hypertension Controlled. Continue current regime.   3. Type 2 diabetes mellitus without complication, without long-term current use of insulin (HCC) Controlled. Continue current regime.   4. Counseled about COVID-19 virus infection Discussed covid vaccine efficacy and safety profile with patient, answered her questions  Return in about 4 months (around 12/04/2019).    Rutherford Guys, MD Primary Care at [place], [place] [address] Ph.  [phone] Fax [phone]

## 2019-08-10 ENCOUNTER — Other Ambulatory Visit: Payer: Self-pay | Admitting: Pharmacist

## 2019-08-10 MED ORDER — RIVAROXABAN 20 MG PO TABS: 20.0000 mg | ORAL_TABLET | Freq: Every day | ORAL | 5 refills | Status: DC

## 2019-08-10 NOTE — Telephone Encounter (Signed)
Last OV 03/09/2019 Scr 1.05 on 05/24/19 crcl 37ml/min

## 2019-08-15 DIAGNOSIS — R3121 Asymptomatic microscopic hematuria: Secondary | ICD-10-CM | POA: Diagnosis not present

## 2019-08-15 DIAGNOSIS — R3129 Other microscopic hematuria: Secondary | ICD-10-CM | POA: Diagnosis not present

## 2019-08-15 DIAGNOSIS — N2889 Other specified disorders of kidney and ureter: Secondary | ICD-10-CM | POA: Diagnosis not present

## 2019-08-15 DIAGNOSIS — R6889 Other general symptoms and signs: Secondary | ICD-10-CM | POA: Diagnosis not present

## 2019-08-28 DIAGNOSIS — H34831 Tributary (branch) retinal vein occlusion, right eye, with macular edema: Secondary | ICD-10-CM | POA: Diagnosis not present

## 2019-08-28 DIAGNOSIS — R6889 Other general symptoms and signs: Secondary | ICD-10-CM | POA: Diagnosis not present

## 2019-08-31 ENCOUNTER — Other Ambulatory Visit: Payer: Self-pay

## 2019-08-31 DIAGNOSIS — E119 Type 2 diabetes mellitus without complications: Secondary | ICD-10-CM

## 2019-08-31 MED ORDER — TRUEPLUS LANCETS 28G MISC: 3 refills | Status: DC

## 2019-09-04 DIAGNOSIS — R3129 Other microscopic hematuria: Secondary | ICD-10-CM | POA: Diagnosis not present

## 2019-09-04 DIAGNOSIS — D4102 Neoplasm of uncertain behavior of left kidney: Secondary | ICD-10-CM | POA: Diagnosis not present

## 2019-09-04 DIAGNOSIS — M545 Low back pain: Secondary | ICD-10-CM | POA: Diagnosis not present

## 2019-09-05 ENCOUNTER — Telehealth: Payer: Self-pay | Admitting: Family Medicine

## 2019-09-05 NOTE — Telephone Encounter (Signed)
What is the name of the medication? TRUEplus Lancets 28G MISC [id number]    Have you contacted your pharmacy to request a refill? Yes she has. She is saying  they told her they have not gotten our request and she was given this new fax#[phone]  Which pharmacy would you like this sent to? [facility], [address]  [place], [place] [address]  Phone:  [phone] Fax:  [phone]       Patient notified that their request is being sent to the clinical staff for review and that they should receive a call once it is complete. If they do not receive a call within 72 hours they can check with their pharmacy or our office.

## 2019-09-05 NOTE — Telephone Encounter (Signed)
Spoke to patient to let her know on 08/31/2019 request for Trueplus lancets with 3 refills was sent to [facility]. Per patient she stated okay.

## 2019-09-07 NOTE — Progress Notes (Signed)
Patient ID: Catherine Joseph, female   DOB: December 19, 1940, 79 y.o.   MRN: [id number]      78 y.o.  [place] female history of PAF on flecainide. Has been on coumadin and now xarelto for anticoagulationHistory of atypical chest pain with normal stress echo 2013 and normal myovue 03/21/14   Lives with daughter/husband and 3 grand children ages 32, 86 and 9 Originally from [place] and lived in [place] No longer traveling to [place] Had two sisters die of cancer this year Still has nephews in [place]  No palpitations dyspnea or chest pain No bleeding issues [facility]  checks her lab work   Has some chronic vertigo and BPPV  No palpitations or bleeding issues   ROS: Denies fever, malais, weight loss, blurry vision, decreased visual acuity, cough, sputum, SOB, hemoptysis, pleuritic pain, palpitaitons, heartburn, abdominal pain, melena, lower extremity edema, claudication, or rash.  All other systems reviewed and negative  General: BP 134/70   Pulse (!) 57   Ht '5\' 3"'$  (1.6 m)   Wt 187 lb (84.8 kg)   SpO2 99%   BMI 33.13 kg/m  Affect appropriate Healthy:  appears stated age 70: normal Neck supple with no adenopathy JVP normal no bruits no thyromegaly Lungs clear with no wheezing and good diaphragmatic motion Heart:  S1/S2 no murmur, no rub, gallop or click PMI normal Abdomen: benighn, BS positve, no tenderness, no AAA no bruit.  No HSM or HJR Distal pulses intact with no bruits No edema Neuro non-focal Skin warm and dry No muscular weakness    Current Outpatient Medications  Medication Sig Dispense Refill  . acetaminophen (TYLENOL) 500 MG tablet Take 1 tablet (500 mg total) by mouth every 6 (six) hours as needed. 30 tablet 0  . Alcohol Swabs (B-D SINGLE USE SWABS REGULAR) PADS USE TO CHECK BLOOD SUGAR DAILY  100 each 3  . Blood Glucose Calibration (SURECHEK CONTROL SOLUTION) Normal LIQD Use to check blood sugar daily. Dx E11.65 1 each 3  . blood glucose  meter kit and supplies Use to test blood sugar once daily. Dx: E11.65 1 each 0  . Blood Glucose Monitoring Suppl (TRUE METRIX AIR GLUCOSE METER) DEVI 1 each by Does not apply route daily. Use to test blood sugar once daily. Dx: E11.65 1 Device 0  . Calcium-Magnesium-Vitamin D (CALCIUM 500 PO) Take 500 mg by mouth daily.    . cloNIDine (CATAPRES) 0.1 MG tablet TAKE 1 TABLET AT BEDTIME 90 tablet 1  . flecainide (TAMBOCOR) 100 MG tablet TAKE 1/2 TABLET TWICE DAILY 90 tablet 2  . glipiZIDE (GLUCOTROL) 10 MG tablet TAKE 1 TABLET TWICE DAILY BEFORE A MEAL 180 tablet 1  . irbesartan-hydrochlorothiazide (AVALIDE) 300-12.5 MG tablet TAKE 1 TABLET EVERY DAY 90 tablet 1  . metFORMIN (GLUCOPHAGE-XR) 500 MG 24 hr tablet TAKE 2 TABLETS DAILY WITH BREAKFAST 180 tablet 1  . nystatin cream (MYCOSTATIN) APPLY EXTERNALLY TO THE AFFECTED AREA TWICE DAILY 30 g 1  . PAZEO 0.7 % SOLN Place 1 drop into both eyes daily.  11  . polyethylene glycol powder (GLYCOLAX/MIRALAX) 17 GM/SCOOP powder Take 17 g by mouth 2 (two) times daily as needed. 255 g 3  . polyethylene glycol powder (GLYCOLAX/MIRALAX) powder Take 17 g by mouth 2 (two) times daily as needed. 500 g 1  . rivaroxaban (XARELTO) 20 MG TABS tablet Take 1 tablet (20 mg total) by mouth daily with supper. 30 tablet 5  . simvastatin (ZOCOR) 20 MG tablet  TAKE 1 TABLET EVERY DAY  AT  6PM 90 tablet 3  . TRUE METRIX BLOOD GLUCOSE TEST test strip TEST BLOOD SUGAR ONE TIME DAILY 100 each 3  . TRUEplus Lancets 28G MISC USE TO TEST BLOOD SUGAR ONCE DAILY. 100 each 3   Current Facility-Administered Medications  Medication Dose Route Frequency Provider Last Rate Last Admin  . cyanocobalamin ((VITAMIN B-12)) injection 1,000 mcg  1,000 mcg Intramuscular Q30 days Shawnee Knapp, MD   1,000 mcg at 09/09/17 1034    Allergies  Codeine  Electrocardiogram:   08/02/18 SR rate 69 PAC otherwise normal  03/09/19 SR rate 69 PAC;s no acute changes   Assessment and Plan  PAF: maintains NSR  on flecainide Anticoagulation with xarelto although there have been some issues affording long discussion with daughter about need to continue both Filled out patient assistance paperwork for xarelto  HTN:  Well controlled.  Continue current medications and low sodium Dash type diet.    Chol:  Continue statin LDL 85 05/24/19   DM:  Discussed low carb diet.  Target hemoglobin A1c is 6.5 or less.  Continue current medications. Lab Results  Component Value Date   HGBA1C 6.2 (H) 06/19/2019    F/U with me in a year   Jenkins Rouge

## 2019-09-13 ENCOUNTER — Other Ambulatory Visit: Payer: Self-pay

## 2019-09-13 ENCOUNTER — Encounter: Payer: Self-pay | Admitting: Cardiovascular Disease

## 2019-09-13 ENCOUNTER — Ambulatory Visit: Payer: Medicare HMO | Admitting: Cardiovascular Disease

## 2019-09-13 VITALS — BP 134/70 | HR 57 | Ht 63.0 in | Wt 187.0 lb

## 2019-09-13 DIAGNOSIS — I480 Paroxysmal atrial fibrillation: Secondary | ICD-10-CM | POA: Diagnosis not present

## 2019-09-13 DIAGNOSIS — E1151 Type 2 diabetes mellitus with diabetic peripheral angiopathy without gangrene: Secondary | ICD-10-CM | POA: Diagnosis not present

## 2019-09-13 NOTE — Patient Instructions (Signed)

## 2019-09-18 ENCOUNTER — Other Ambulatory Visit: Payer: Self-pay | Admitting: Family Medicine

## 2019-09-18 DIAGNOSIS — E785 Hyperlipidemia, unspecified: Secondary | ICD-10-CM

## 2019-09-18 DIAGNOSIS — E119 Type 2 diabetes mellitus without complications: Secondary | ICD-10-CM

## 2019-09-18 NOTE — Telephone Encounter (Signed)
Requested Prescriptions  Pending Prescriptions Disp Refills  . simvastatin (ZOCOR) 20 MG tablet [Pharmacy Med Name: SIMVASTATIN 20 MG Tablet] 90 tablet 2    Sig: TAKE 1 TABLET EVERY DAY AT 6:00PM     Cardiovascular:  Antilipid - Statins Passed - 09/18/2019  8:56 PM      Passed - Total Cholesterol in normal range and within 360 days    Cholesterol, Total  Date Value Ref Range Status  05/24/2019 172 100 - 199 mg/dL Final         Passed - LDL in normal range and within 360 days    LDL Chol Calc (NIH)  Date Value Ref Range Status  05/24/2019 85 0 - 99 mg/dL Final   Direct LDL  Date Value Ref Range Status  02/20/2013 119 (H) mg/dL Final    Comment:    ATP III Classification (LDL):       < 100        mg/dL         Optimal      100 - 129     mg/dL         Near or Above Optimal      130 - 159     mg/dL         Borderline High      160 - 189     mg/dL         High       > 190        mg/dL         Very High     LDL Direct  Date Value Ref Range Status  08/08/2017 92 0 - 99 mg/dL Final         Passed - HDL in normal range and within 360 days    HDL  Date Value Ref Range Status  05/24/2019 63 >39 mg/dL Final         Passed - Triglycerides in normal range and within 360 days    Triglycerides  Date Value Ref Range Status  05/24/2019 137 0 - 149 mg/dL Final         Passed - Patient is not pregnant      Passed - Valid encounter within last 12 months    Recent Outpatient Visits          1 month ago Microhematuria   [facility] at [place], Lilia Argue, MD   3 months ago Type 2 diabetes mellitus without complication, without long-term current use of insulin [facility])   [facility], Lilia Argue, MD   3 months ago Right flank pain   [facility], Lilia Argue, MD   4 months ago Chronic right-sided thoracic back pain   [facility], Lilia Argue, MD   5 months ago Rib pain on right side   [facility], Lilia Argue, MD       Future Appointments            In 1 week Rutherford Guys, MD Primary Care at [place], [facility]   In 2 months Rutherford Guys, MD Primary Care at [facility], [facility]           . cloNIDine (CATAPRES) 0.1 MG tablet [Pharmacy Med Name: CLONIDINE HYDROCHLORIDE 0.1 MG Tablet] 90 tablet 1    Sig: TAKE 1 TABLET AT BEDTIME     Cardiovascular:  Alpha-2 Agonists Passed - 09/18/2019  8:56 PM  Passed - Last BP in normal range    BP Readings from Last 1 Encounters:  09/13/19 134/70         Passed - Last Heart Rate in normal range    Pulse Readings from Last 1 Encounters:  09/13/19 (!) 57         Passed - Valid encounter within last 6 months    Recent Outpatient Visits          1 month ago Microhematuria   [facility] at [place], Lilia Argue, MD   3 months ago Type 2 diabetes mellitus without complication, without long-term current use of insulin [facility])   [facility], Lilia Argue, MD   3 months ago Right flank pain   [facility], Lilia Argue, MD   4 months ago Chronic right-sided thoracic back pain   [facility], Lilia Argue, MD   5 months ago Rib pain on right side   [facility], Lilia Argue, MD      Future Appointments            In 1 week Rutherford Guys, MD Primary Care at [place], [facility]   In 2 months Rutherford Guys, MD Primary Care at [facility], [facility]           . metFORMIN (GLUCOPHAGE-XR) 500 MG 24 hr tablet [Pharmacy Med Name: METFORMIN HYDROCHLORIDE ER 500 MG Tablet Extended Release 24 Hour] 180 tablet 1    Sig: TAKE 2 [place]     Endocrinology:  Diabetes - Biguanides Failed - 09/18/2019  8:56 PM      Failed - Cr in normal range and within 360 days    Creat  Date Value Ref Range Status  11/29/2015 1.03 (H) 0.60 - 0.93 mg/dL Final   Creatinine, Ser  Date Value Ref Range Status  05/24/2019 1.05 (H) 0.57 - 1.00 mg/dL Final   Creatinine, Urine  Date Value Ref Range Status  08/28/2015 362 (H) 20  - 320 mg/dL Final    Comment:    Result repeated and verified. Result confirmed by automatic dilution.          Failed - eGFR in normal range and within 360 days    GFR calc Af Amer  Date Value Ref Range Status  05/24/2019 58 (L) >59 mL/min/1.73 Final   GFR calc non Af Amer  Date Value Ref Range Status  05/24/2019 50 (L) >59 mL/min/1.73 Final   GFR  Date Value Ref Range Status  03/17/2015 63.85 >60.00 mL/min Final         Passed - HBA1C is between 0 and 7.9 and within 180 days    Hgb A1c MFr Bld  Date Value Ref Range Status  06/19/2019 6.2 (H) 4.8 - 5.6 % Final    Comment:             Prediabetes: 5.7 - 6.4          Diabetes: >6.4          Glycemic control for adults with diabetes: <7.0          Passed - Valid encounter within last 6 months    Recent Outpatient Visits          1 month ago Microhematuria   Primary Care at [place], Lilia Argue, MD   3 months ago Type 2 diabetes mellitus without complication, without long-term current use of insulin ([place])  [facility], Lilia Argue, MD   3 months ago Right flank pain   [facility], Lilia Argue, MD   4 months ago Chronic right-sided thoracic back pain   [facility], Lilia Argue, MD   5 months ago Rib pain on right side   [facility], Lilia Argue, MD      Future Appointments            In 1 week Rutherford Guys, MD Primary Care at [place], [facility]   In 2 months Rutherford Guys, MD Primary Care at [place], [facility]           . irbesartan-hydrochlorothiazide (AVALIDE) 300-12.5 MG tablet [Pharmacy Med Name: IRBESARTAN/HYDROCHLOROTHIAZIDE 300-12.5 MG Tablet] 90 tablet 1    Sig: TAKE 1 TABLET EVERY DAY     Cardiovascular: ARB + Diuretic Combos Failed - 09/18/2019  8:56 PM      Failed - Cr in normal range and within 180 days    Creat  Date Value Ref Range Status  11/29/2015 1.03 (H) 0.60 - 0.93 mg/dL Final   Creatinine, Ser  Date Value Ref Range Status   05/24/2019 1.05 (H) 0.57 - 1.00 mg/dL Final   Creatinine, Urine  Date Value Ref Range Status  08/28/2015 362 (H) 20 - 320 mg/dL Final    Comment:    Result repeated and verified. Result confirmed by automatic dilution.          Passed - K in normal range and within 180 days    Potassium  Date Value Ref Range Status  05/24/2019 4.1 3.5 - 5.2 mmol/L Final         Passed - Na in normal range and within 180 days    Sodium  Date Value Ref Range Status  05/24/2019 142 134 - 144 mmol/L Final         Passed - Ca in normal range and within 180 days    Calcium  Date Value Ref Range Status  05/24/2019 9.3 8.7 - 10.3 mg/dL Final   Calcium, Ion  Date Value Ref Range Status  04/08/2017 1.14 (L) 1.15 - 1.40 mmol/L Final         Passed - Patient is not pregnant      Passed - Last BP in normal range    BP Readings from Last 1 Encounters:  09/13/19 134/70         Passed - Valid encounter within last 6 months    Recent Outpatient Visits          1 month ago Microhematuria   Primary Care at [place], Lilia Argue, MD   3 months ago Type 2 diabetes mellitus without complication, without long-term current use of insulin [facility])   [facility], Lilia Argue, MD   3 months ago Right flank pain   [facility], Lilia Argue, MD   4 months ago Chronic right-sided thoracic back pain   [facility], Lilia Argue, MD   5 months ago Rib pain on right side   [facility], Lilia Argue, MD      Future Appointments            In 1 week Rutherford Guys, MD Primary Care at [place], [facility]   In 2 months Rutherford Guys, MD Primary Care at [place], [facility]           .  glipiZIDE (GLUCOTROL) 10 MG tablet [Pharmacy Med Name: GLIPIZIDE 10 MG Tablet] 180 tablet 1    Sig: TAKE 1 TABLET TWICE DAILY BEFORE A MEAL     Endocrinology:  Diabetes - Sulfonylureas Passed - 09/18/2019  8:56 PM      Passed - HBA1C is between 0 and 7.9 and within 180 days     Hgb A1c MFr Bld  Date Value Ref Range Status  06/19/2019 6.2 (H) 4.8 - 5.6 % Final    Comment:             Prediabetes: 5.7 - 6.4          Diabetes: >6.4          Glycemic control for adults with diabetes: <7.0          Passed - Valid encounter within last 6 months    Recent Outpatient Visits          1 month ago Microhematuria   Primary Care at [place], Lilia Argue, MD   3 months ago Type 2 diabetes mellitus without complication, without long-term current use of insulin [facility])   [facility], Lilia Argue, MD   3 months ago Right flank pain   [facility], Lilia Argue, MD   4 months ago Chronic right-sided thoracic back pain   [facility], Lilia Argue, MD   5 months ago Rib pain on right side   [facility], Lilia Argue, MD      Future Appointments            In 1 week Rutherford Guys, MD Primary Care at [place], [facility]   In 2 months Rutherford Guys, MD Primary Care at [place], [facility]

## 2019-09-25 ENCOUNTER — Ambulatory Visit: Payer: Medicare HMO | Admitting: Family Medicine

## 2019-09-25 DIAGNOSIS — H34831 Tributary (branch) retinal vein occlusion, right eye, with macular edema: Secondary | ICD-10-CM | POA: Diagnosis not present

## 2019-09-25 DIAGNOSIS — H35372 Puckering of macula, left eye: Secondary | ICD-10-CM | POA: Diagnosis not present

## 2019-09-26 ENCOUNTER — Ambulatory Visit: Payer: Medicare HMO | Admitting: Family Medicine

## 2019-09-28 ENCOUNTER — Encounter: Payer: Self-pay | Admitting: Family Medicine

## 2019-10-08 ENCOUNTER — Other Ambulatory Visit: Payer: Self-pay | Admitting: Family Medicine

## 2019-10-08 ENCOUNTER — Telehealth (INDEPENDENT_AMBULATORY_CARE_PROVIDER_SITE_OTHER): Payer: Medicare HMO | Admitting: Family Medicine

## 2019-10-08 ENCOUNTER — Encounter: Payer: Self-pay | Admitting: Family Medicine

## 2019-10-08 ENCOUNTER — Other Ambulatory Visit: Payer: Self-pay

## 2019-10-08 DIAGNOSIS — M546 Pain in thoracic spine: Secondary | ICD-10-CM | POA: Diagnosis not present

## 2019-10-08 DIAGNOSIS — H34831 Tributary (branch) retinal vein occlusion, right eye, with macular edema: Secondary | ICD-10-CM | POA: Diagnosis not present

## 2019-10-08 DIAGNOSIS — G8929 Other chronic pain: Secondary | ICD-10-CM

## 2019-10-08 NOTE — Patient Instructions (Signed)
° ° ° °  If you have lab work done today you will be contacted with your lab results within the next 2 weeks.  If you have not heard from us then please contact us. The fastest way to get your results is to register for My Chart. ° ° °IF you received an x-ray today, you will receive an invoice from [facility]. Please contact [facility] at [phone] with questions or concerns regarding your invoice.  ° °IF you received labwork today, you will receive an invoice from [facility]. Please contact [facility] at [phone] with questions or concerns regarding your invoice.  ° °Our billing staff will not be able to assist you with questions regarding bills from these companies. ° °You will be contacted with the lab results as soon as they are available. The fastest way to get your results is to activate your My Chart account. Instructions are located on the last page of this paperwork. If you have not heard from us regarding the results in 2 weeks, please contact this office. °  ° ° ° °

## 2019-10-08 NOTE — Progress Notes (Signed)
Virtual Visit Note  I connected with patient on 10/08/19 at 555pm by phone (per patient's preference) and verified that I am speaking with the correct person using two identifiers. Catherine Joseph is currently located at home and patient is currently with them during visit. The provider, Rutherford Guys, MD is located in their office at time of visit.  I discussed the limitations, risks, security and privacy concerns of performing an evaluation and management service by telephone and the availability of in person appointments. I also discussed with the patient that there may be a patient responsible charge related to this service. The patient expressed understanding and agreed to proceed.   I provided 10 minutes of non-face-to-face time during this encounter.  Chief Complaint  Patient presents with  . Back Pain    chronic  . Referral    to opthalmologist dr Sligo [facility] / had 2 injection    HPI ? 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 Codeine Other (See Comments)    vertigo    Prior to Admission medications   Medication Sig Start Date End Date Taking? Authorizing Provider  acetaminophen (TYLENOL) 500 MG tablet Take 1 tablet (500 mg total) by mouth every 6 (six) hours as needed. 11/19/16  Yes Shawnee Knapp, MD  Alcohol Swabs (B-D SINGLE USE SWABS REGULAR) PADS USE TO CHECK BLOOD SUGAR DAILY  06/03/17  Yes Shawnee Knapp, MD  Blood Glucose Calibration (SURECHEK CONTROL SOLUTION) Normal LIQD Use to check blood sugar daily. Dx E11.65 09/08/15  Yes Shawnee Knapp, MD  blood glucose meter kit and supplies Use to test blood sugar once daily. Dx: E11.65 02/27/18  Yes Shawnee Knapp, MD  Blood Glucose Monitoring  Suppl (TRUE METRIX AIR GLUCOSE METER) DEVI 1 each by Does not apply route daily. Use to test blood sugar once daily. Dx: E11.65 09/09/15  Yes Ivar Drape D, PA  Calcium-Magnesium-Vitamin D (CALCIUM 500 PO) Take 500 mg by mouth daily.   Yes [provider]  cloNIDine (CATAPRES) 0.1 MG tablet TAKE 1 TABLET AT BEDTIME 09/18/19  Yes Rutherford Guys, MD  flecainide (TAMBOCOR) 100 MG tablet TAKE 1/2 TABLET TWICE DAILY 07/24/19  Yes Josue Hector, MD  glipiZIDE (GLUCOTROL) 10 MG tablet TAKE 1 TABLET TWICE DAILY BEFORE A MEAL 09/18/19  Yes Rutherford Guys, MD  irbesartan-hydrochlorothiazide (AVALIDE) 300-12.5 MG tablet TAKE 1 TABLET EVERY DAY 09/18/19  Yes Rutherford Guys, MD  metFORMIN (GLUCOPHAGE-XR) 500 MG 24 hr tablet TAKE 2 TABLETS DAILY WITH BREAKFAST 09/18/19  Yes Rutherford Guys, MD  nystatin cream (MYCOSTATIN) APPLY EXTERNALLY TO THE AFFECTED AREA TWICE DAILY 06/19/19  Yes Rutherford Guys, MD  PAZEO 0.7 % SOLN Place 1 drop into both eyes daily. 05/24/16  Yes [provider]  polyethylene glycol powder (GLYCOLAX/MIRALAX) 17 GM/SCOOP powder Take 17 g by mouth 2 (two) times daily as needed. 05/24/19  Yes Rutherford Guys, MD  polyethylene glycol powder (GLYCOLAX/MIRALAX) powder Take 17 g by mouth 2 (two) times daily as needed. 11/19/16  Yes Shawnee Knapp, MD  rivaroxaban (XARELTO) 20 MG TABS tablet Take 1 tablet (20 mg total) by mouth daily with supper. 08/10/19  Yes Josue Hector, MD  simvastatin (ZOCOR) 20 MG tablet TAKE 1 TABLET EVERY DAY AT 6:00PM 09/18/19  Yes Rutherford Guys, MD  TRUE METRIX BLOOD GLUCOSE TEST test strip TEST BLOOD SUGAR ONE TIME DAILY 06/03/17  Yes Shawnee Knapp, MD  TRUEplus Lancets 28G MISC USE TO TEST BLOOD SUGAR ONCE DAILY. 08/31/19  Yes Rutherford Guys, MD    Past Medical History:  Diagnosis Date  . Anemia   . Arthritis   . Bradycardia   . Cataract   . Chronic kidney disease   . Diabetes mellitus type II   . Hypertension   . Osteoporosis   .  Syncope   . Weakness     Past Surgical History:  Procedure Laterality Date  . ABDOMINAL HYSTERECTOMY    . CESAREAN SECTION    . COLON SURGERY    . EYE SURGERY    . FRACTURE SURGERY      Social History   Tobacco Use  . Smoking status: Never Smoker  . Smokeless tobacco: Never Used  Substance Use Topics  . Alcohol use: No    Family History  Problem Relation Age of Onset  . Diabetes Sister   . Hyperlipidemia Sister   . Hypertension Sister   . Diabetes Brother   . Hyperlipidemia Brother   . Hypertension Brother     ROS Per hpi  Objective  Vitals as reported by the patient: none  GEN: AAOx3, NAD Resp: breathing comfortably, speaking in full sentences  ASSESSMENT and PLAN  1. Branch retinal vein occlusion of right eye with macular edema - Ambulatory referral to Ophthalmology  2. Chronic right-sided thoracic back pain Discussed APAP use  FOLLOW-UP: as scheduled   The above assessment and management plan was discussed with the patient. The patient verbalized understanding of and has agreed to the management plan. Patient is aware to call the clinic if symptoms persist or worsen. Patient is aware when to return to the clinic for a follow-up visit. Patient educated on when it is appropriate to go to the emergency department.     Rutherford Guys, MD [facility] [place],  [address] Ph.  [phone] Fax [phone]

## 2019-10-23 DIAGNOSIS — H34831 Tributary (branch) retinal vein occlusion, right eye, with macular edema: Secondary | ICD-10-CM | POA: Diagnosis not present

## 2019-10-23 DIAGNOSIS — R6889 Other general symptoms and signs: Secondary | ICD-10-CM | POA: Diagnosis not present

## 2019-10-23 DIAGNOSIS — H35372 Puckering of macula, left eye: Secondary | ICD-10-CM | POA: Diagnosis not present

## 2019-10-23 DIAGNOSIS — E119 Type 2 diabetes mellitus without complications: Secondary | ICD-10-CM | POA: Diagnosis not present

## 2019-11-23 DIAGNOSIS — H34831 Tributary (branch) retinal vein occlusion, right eye, with macular edema: Secondary | ICD-10-CM | POA: Diagnosis not present

## 2019-11-23 DIAGNOSIS — H35373 Puckering of macula, bilateral: Secondary | ICD-10-CM | POA: Diagnosis not present

## 2019-11-23 DIAGNOSIS — H35033 Hypertensive retinopathy, bilateral: Secondary | ICD-10-CM | POA: Diagnosis not present

## 2019-12-04 ENCOUNTER — Emergency Department (HOSPITAL_COMMUNITY): Payer: Medicare HMO

## 2019-12-04 ENCOUNTER — Emergency Department (HOSPITAL_COMMUNITY)
Admission: EM | Admit: 2019-12-04 | Discharge: 2019-12-04 | Disposition: A | Discharge status: Home / Self Care | Payer: Medicare HMO | Attending: Emergency Medicine | Admitting: Emergency Medicine

## 2019-12-04 ENCOUNTER — Other Ambulatory Visit: Payer: Self-pay

## 2019-12-04 ENCOUNTER — Ambulatory Visit: Payer: Medicare HMO | Admitting: Family Medicine

## 2019-12-04 ENCOUNTER — Telehealth: Payer: Self-pay

## 2019-12-04 ENCOUNTER — Encounter (HOSPITAL_COMMUNITY): Payer: Self-pay

## 2019-12-04 DIAGNOSIS — R519 Headache, unspecified: Secondary | ICD-10-CM | POA: Diagnosis not present

## 2019-12-04 DIAGNOSIS — N189 Chronic kidney disease, unspecified: Secondary | ICD-10-CM | POA: Insufficient documentation

## 2019-12-04 DIAGNOSIS — G44209 Tension-type headache, unspecified, not intractable: Secondary | ICD-10-CM | POA: Diagnosis not present

## 2019-12-04 DIAGNOSIS — I4891 Unspecified atrial fibrillation: Secondary | ICD-10-CM | POA: Diagnosis not present

## 2019-12-04 DIAGNOSIS — G4489 Other headache syndrome: Secondary | ICD-10-CM | POA: Diagnosis not present

## 2019-12-04 DIAGNOSIS — I129 Hypertensive chronic kidney disease with stage 1 through stage 4 chronic kidney disease, or unspecified chronic kidney disease: Secondary | ICD-10-CM | POA: Insufficient documentation

## 2019-12-04 DIAGNOSIS — Z79899 Other long term (current) drug therapy: Secondary | ICD-10-CM | POA: Diagnosis not present

## 2019-12-04 DIAGNOSIS — E1122 Type 2 diabetes mellitus with diabetic chronic kidney disease: Secondary | ICD-10-CM | POA: Insufficient documentation

## 2019-12-04 DIAGNOSIS — R52 Pain, unspecified: Secondary | ICD-10-CM | POA: Diagnosis not present

## 2019-12-04 DIAGNOSIS — Z7984 Long term (current) use of oral hypoglycemic drugs: Secondary | ICD-10-CM | POA: Insufficient documentation

## 2019-12-04 DIAGNOSIS — I10 Essential (primary) hypertension: Secondary | ICD-10-CM | POA: Diagnosis not present

## 2019-12-04 LAB — BASIC METABOLIC PANEL
Anion gap: 12 (ref 5–15)
BUN: 19 mg/dL (ref 8–23)
CO2: 27 mmol/L (ref 22–32)
Calcium: 9.5 mg/dL (ref 8.9–10.3)
Chloride: 101 mmol/L (ref 98–111)
Creatinine, Ser: 0.99 mg/dL (ref 0.44–1.00)
GFR calc Af Amer: >60 mL/min (ref >60)
GFR calc non Af Amer: 54 mL/min — ABNORMAL LOW (ref >60)
Glucose, Bld: 154 mg/dL — ABNORMAL HIGH (ref 70–99)
Potassium: 3.8 mmol/L (ref 3.5–5.1)
Sodium: 140 mmol/L (ref 135–145)

## 2019-12-04 LAB — CBC WITH DIFFERENTIAL/PLATELET
Abs Immature Granulocytes: 0.01 10*3/uL (ref 0.00–0.07)
Basophils Absolute: 0 10*3/uL (ref 0.0–0.1)
Basophils Relative: 0 %
Eosinophils Absolute: 0 10*3/uL (ref 0.0–0.5)
Eosinophils Relative: 0 %
HCT: 46.4 % — ABNORMAL HIGH (ref 36.0–46.0)
Hemoglobin: 14.9 g/dL (ref 12.0–15.0)
Immature Granulocytes: 0 %
Lymphocytes Relative: 34 %
Lymphs Abs: 1.6 10*3/uL (ref 0.7–4.0)
MCH: 28.2 pg (ref 26.0–34.0)
MCHC: 32.1 g/dL (ref 30.0–36.0)
MCV: 87.9 fL (ref 80.0–100.0)
Monocytes Absolute: 0.2 10*3/uL (ref 0.1–1.0)
Monocytes Relative: 5 %
Neutro Abs: 2.8 10*3/uL (ref 1.7–7.7)
Neutrophils Relative %: 61 %
Platelets: 173 10*3/uL (ref 150–400)
RBC: 5.28 MIL/uL — ABNORMAL HIGH (ref 3.87–5.11)
RDW: 13.9 % (ref 11.5–15.5)
WBC: 4.6 10*3/uL (ref 4.0–10.5)
nRBC: 0 % (ref 0.0–0.2)

## 2019-12-04 LAB — CBG MONITORING, ED: Glucose-Capillary: 139 mg/dL — ABNORMAL HIGH (ref 70–99)

## 2019-12-04 MED ORDER — METOCLOPRAMIDE HCL 5 MG/ML IJ SOLN
2.5000 mg | Freq: Once | INTRAMUSCULAR | Status: AC
  Administered 2019-12-04: 2.5 mg via INTRAVENOUS
  Filled 2019-12-04: qty 2

## 2019-12-04 NOTE — ED Triage Notes (Signed)
Patient coming from home with c/o headache started last night and worse on the left side of the head.

## 2019-12-04 NOTE — Discharge Instructions (Addendum)
Do not take medication such as ibuprofen or Aleve while you are on the Xarelto. Follow-up with your primary care provider. Return to the ER for worsening headache, blurry vision, injuries or falls, numbness in arms or legs or chest pain.

## 2019-12-04 NOTE — Telephone Encounter (Signed)
Patient's daughter called saying patient was experiencing a bad headache and some dizziness. she will be taking patient to ER. Advised to call 911 for EMS to rule out stroke symptoms.

## 2019-12-04 NOTE — ED Provider Notes (Signed)
[place] DEPT Provider Note   CSN: [id number] Arrival date & time: 12/04/19  1212     History No chief complaint on file.   Catherine Joseph is a 79 y.o. female with a past medical history of CKD, diabetes, hypertension A. fib currently on Xarelto presenting to the ED with a chief complaint of headache.  Woke up this morning with left-sided throbbing headache and right-sided blurry vision.  States that she gets headaches "every once in a while" but this feels different.  She went to sleep last night in her usual state of health.  Daughter gave her a dose of ibuprofen prior to arrival without any improvement. She reports chronic R sided blurry vision and flashing that she has spoken to her PCP about. 2 days ago, patient ate pizza and started having vomiting and diarrhea afterwards.  This lasted for about a day and felt much improved yesterday with normal p.o. intake. Denies any injuries or falls, numbness in arms or legs, vomiting today, neck stiffness, fever, prior stroke or hemorrhage.  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     Family History  Problem Relation Age  of Onset   Diabetes Sister    Hyperlipidemia Sister    Hypertension Sister    Diabetes Brother    Hyperlipidemia Brother    Hypertension Brother     Social History   Tobacco Use   Smoking status: Never Smoker   Smokeless tobacco: Never Used  Scientific laboratory technician Use: Never used  Substance Use Topics   Alcohol use: No   Drug use: No    Home Medications Prior to Admission medications   Medication Sig Start Date End Date Taking? Authorizing Provider  acetaminophen (TYLENOL) 500 MG tablet Take 1 tablet (500 mg total) by mouth every 6 (six) hours as needed. 11/19/16  Yes Shawnee Knapp, MD  cloNIDine (CATAPRES) 0.1 MG tablet TAKE 1 TABLET AT BEDTIME Patient taking differently: Take 0.1 mg by mouth at bedtime.  09/18/19  Yes Rutherford Guys, MD  flecainide (TAMBOCOR) 100 MG tablet TAKE 1/2 TABLET TWICE DAILY Patient taking differently: Take 50 mg by mouth 2 (two) times daily.  07/24/19  Yes Josue Hector, MD  glipiZIDE (GLUCOTROL) 10 MG tablet TAKE 1 TABLET TWICE DAILY BEFORE A MEAL Patient taking differently: Take 10 mg by mouth 2 (two) times daily before a meal.  09/18/19  Yes Rutherford Guys, MD  irbesartan-hydrochlorothiazide (AVALIDE) 300-12.5 MG tablet TAKE 1 TABLET EVERY DAY Patient  taking differently: Take 1 tablet by mouth daily.  09/18/19  Yes Rutherford Guys, MD  metFORMIN (GLUCOPHAGE-XR) 500 MG 24 hr tablet TAKE 2 TABLETS DAILY WITH BREAKFAST Patient taking differently: Take 1,000 mg by mouth daily with breakfast.  09/18/19  Yes Rutherford Guys, MD  rivaroxaban (XARELTO) 20 MG TABS tablet Take 1 tablet (20 mg total) by mouth daily with supper. 08/10/19  Yes Josue Hector, MD  simvastatin (ZOCOR) 20 MG tablet TAKE 1 TABLET EVERY DAY AT 6:00PM Patient taking differently: Take 20 mg by mouth daily at 6 PM.  09/18/19  Yes Rutherford Guys, MD  Alcohol Swabs (B-D SINGLE USE SWABS REGULAR) PADS USE TO CHECK BLOOD SUGAR DAILY  06/03/17   Shawnee Knapp, MD  Blood Glucose  Calibration (SURECHEK CONTROL SOLUTION) Normal LIQD Use to check blood sugar daily. Dx E11.65 09/08/15   Shawnee Knapp, MD  blood glucose meter kit and supplies Use to test blood sugar once daily. Dx: E11.65 02/27/18   Shawnee Knapp, MD  Blood Glucose Monitoring Suppl (TRUE METRIX AIR GLUCOSE METER) DEVI 1 each by Does not apply route daily. Use to test blood sugar once daily. Dx: E11.65 09/09/15   Ivar Drape D, PA  nystatin cream (MYCOSTATIN) APPLY EXTERNALLY TO THE AFFECTED AREA TWICE DAILY Patient not taking: Reported on 12/04/2019 06/19/19   Rutherford Guys, MD  polyethylene glycol powder (GLYCOLAX/MIRALAX) 17 GM/SCOOP powder Take 17 g by mouth 2 (two) times daily as needed. Patient not taking: Reported on 12/04/2019 05/24/19   Rutherford Guys, MD  polyethylene glycol powder (GLYCOLAX/MIRALAX) powder Take 17 g by mouth 2 (two) times daily as needed. Patient not taking: Reported on 12/04/2019 11/19/16   Shawnee Knapp, MD  TRUE METRIX BLOOD GLUCOSE TEST test strip TEST BLOOD SUGAR ONE TIME DAILY 06/03/17   Shawnee Knapp, MD  TRUEplus Lancets 28G MISC USE TO TEST BLOOD SUGAR ONCE DAILY. 08/31/19   Rutherford Guys, MD    Allergies    Codeine  Review of Systems   Review of Systems  Constitutional: Negative for appetite change, chills and fever.  HENT: Negative for ear pain, rhinorrhea, sneezing and sore throat.   Eyes: Positive for visual disturbance. Negative for photophobia.  Respiratory: Negative for cough, chest tightness, shortness of breath and wheezing.   Cardiovascular: Negative for chest pain and palpitations.  Gastrointestinal: Negative for abdominal pain, blood in stool, constipation, diarrhea, nausea and vomiting.  Genitourinary: Negative for dysuria, hematuria and urgency.  Musculoskeletal: Negative for myalgias.  Skin: Negative for rash.  Neurological: Positive for headaches. Negative for dizziness, weakness and light-headedness.    Physical Exam Updated Vital Signs BP (!) 174/97     Pulse 72    Temp 97.6 F (36.4 C) (Oral)    Resp (!) 21    SpO2 100%   Physical Exam Vitals and nursing note reviewed.  Constitutional:      General: She is not in acute distress.    Appearance: She is well-developed.  HENT:     Head: Normocephalic and atraumatic.     Nose: Nose normal.  Eyes:     General: No scleral icterus.       Right eye: No discharge.        Left eye: No discharge.     Conjunctiva/sclera: Conjunctivae normal.     Pupils: Pupils are equal, round, and reactive to light.  Neck:     Comments: Normal ROM of neck. Cardiovascular:  Rate and Rhythm: Normal rate and regular rhythm.     Heart sounds: Normal heart sounds. No murmur heard.  No friction rub. No gallop.   Pulmonary:     Effort: Pulmonary effort is normal. No respiratory distress.     Breath sounds: Normal breath sounds.  Abdominal:     General: Bowel sounds are normal. There is no distension.     Palpations: Abdomen is soft.     Tenderness: There is no abdominal tenderness. There is no guarding.  Musculoskeletal:        General: Normal range of motion.     Cervical back: Normal range of motion and neck supple.  Skin:    General: Skin is warm and dry.     Findings: No rash.  Neurological:     General: No focal deficit present.     Mental Status: She is alert and oriented to person, place, and time.     Cranial Nerves: No cranial nerve deficit.     Sensory: No sensory deficit.     Motor: No weakness or abnormal muscle tone.     Coordination: Coordination normal.     Comments: Alert, oriented x4. Pupils reactive. No facial asymmetry noted. Cranial nerves appear grossly intact. Sensation intact to light touch on face, BUE and BLE. Strength 5/5 in BUE and BLE. Normal finger to nose coordination bilaterally.     ED Results / Procedures / Treatments   Labs (all labs ordered are listed, but only abnormal results are displayed) Labs Reviewed  BASIC METABOLIC PANEL - Abnormal; Notable for the  following components:      Result Value   Glucose, Bld 154 (*)    GFR calc non Af Amer 54 (*)    All other components within normal limits  CBC WITH DIFFERENTIAL/PLATELET - Abnormal; Notable for the following components:   RBC 5.28 (*)    HCT 46.4 (*)    All other components within normal limits  CBG MONITORING, ED - Abnormal; Notable for the following components:   Glucose-Capillary 139 (*)    All other components within normal limits    EKG None  Radiology CT Head Wo Contrast  Result Date: 12/04/2019 CLINICAL DATA:  New left-sided headache. Blurred vision on the right. EXAM: CT HEAD WITHOUT CONTRAST TECHNIQUE: Contiguous axial images were obtained from the base of the skull through the vertex without intravenous contrast. COMPARISON:  CT and MRI 04/08/2017 FINDINGS: Brain: Age related volume loss. No sign of acute infarction, mass lesion, hemorrhage, hydrocephalus or extra-axial collection. Mild small vessel change of the hemispheric deep white matter. Vascular: There is atherosclerotic calcification of the major vessels at the base of the brain. Skull: Negative Sinuses/Orbits: Clear/normal Other: None IMPRESSION: No acute finding by CT. Age related volume loss. Mild small vessel change of the hemispheric deep white matter. Electronically Signed   By: Nelson Chimes M.D.   On: 12/04/2019 13:58    Procedures Procedures (including critical care time)  Medications Ordered in ED Medications  metoCLOPramide (REGLAN) injection 2.5 mg (2.5 mg Intravenous Given 12/04/19 1414)    ED Course  I have reviewed the triage vital signs and the nursing notes.  Pertinent labs & imaging results that were available during my care of the patient were reviewed by me and considered in my medical decision making (see chart for details).    MDM Rules/Calculators/A&P  79 year old female with past medical history of CKD, diabetes, hypertension, A. fib currently on Xarelto  presenting to the ED with a chief complaint of headache.  Woke up this morning with left-sided throbbing headache and right-sided blurry vision.  She then tells me that she has had right-sided vision changes as well as flashers for the past several months letter PCP is following up with.  States that the headache is different for her.  She took 1 dose of ibuprofen prior to arrival without improvement.  She denies any numbness in arms or legs, neck stiffness, fever.  On exam there is no weakness, or changes to sensation noted.  No facial asymmetry noted.  Pupils are reactive.  Normal finger-to-nose coordination bilaterally.  No pronator drift.  BMP, CBC unremarkable.  CBG is normal.  CT of the head without any acute findings.  Patient was given Reglan here with improvement in her symptoms. Visual acuity screen shows decreased vision on the right. There are no headache characteristics that are lateralizing or concerning for increased ICP, infectious or vascular cause of her symptoms. Will have her continue home medications. Advised her to not take NSAIDs when on Xarelto. Patient discussed with and seen by Dr. Zenia Resides.  All imaging, if done today, including plain films, CT scans, and ultrasounds, independently reviewed by me, and interpretations confirmed via formal radiology reads.  Patient is hemodynamically stable, in NAD, and able to ambulate in the ED. Evaluation does not show pathology that would require ongoing emergent intervention or inpatient treatment. I explained the diagnosis to the patient. Pain has been managed and has no complaints prior to discharge. Patient is comfortable with above plan and is stable for discharge at this time. All questions were answered prior to disposition. Strict return precautions for returning to the ED were discussed. Encouraged follow up with PCP.   An After Visit Summary was printed and given to the patient.   Portions of this note were generated with Geographical information systems officer. Dictation errors may occur despite best attempts at proofreading.  Final Clinical Impression(s) / ED Diagnoses Final diagnoses:  Tension headache    Rx / DC Orders ED Discharge Orders    None       Delia Heady, PA-C 12/04/19 1517    Lacretia Leigh, MD 12/04/19 1519

## 2019-12-20 ENCOUNTER — Telehealth: Payer: Self-pay | Admitting: Family Medicine

## 2019-12-20 DIAGNOSIS — R42 Dizziness and giddiness: Secondary | ICD-10-CM

## 2019-12-20 NOTE — Telephone Encounter (Signed)
Spoke with patient requesting an ENT referral to be placed for reasons of headache and dizziness. States has been seen for these concerns here in recent yr. Please advice

## 2019-12-20 NOTE — Telephone Encounter (Signed)
Pt's mom would like her to have a referral for an ENT specialist. Pt had a previous one before and she would like to know if that one can be used again. I let her know Dr. Linna Darner had an appointment today, she declined. Pease advise [name] at [phone].

## 2019-12-25 DIAGNOSIS — H34831 Tributary (branch) retinal vein occlusion, right eye, with macular edema: Secondary | ICD-10-CM | POA: Diagnosis not present

## 2019-12-25 DIAGNOSIS — R6889 Other general symptoms and signs: Secondary | ICD-10-CM | POA: Diagnosis not present

## 2019-12-25 DIAGNOSIS — H35373 Puckering of macula, bilateral: Secondary | ICD-10-CM | POA: Diagnosis not present

## 2019-12-25 NOTE — Telephone Encounter (Signed)
Need some clarification:  Chart review: [facility] for benign positional vertigo, dizziness and unsteady gait couple of years ago. I do not see that she actually saw an ENT. Can she please provide name of ENT and reason for referral...happy to place.  Thanks

## 2019-12-25 NOTE — Telephone Encounter (Signed)
Called pt back and informed her the message from provider. She stated that ENT referral is for the dizziness and headaches and she did not have an ENT referral in mind.

## 2020-01-08 ENCOUNTER — Telehealth: Payer: Self-pay | Admitting: Family Medicine

## 2020-01-08 NOTE — Telephone Encounter (Signed)
Pt called stated she received a text message telling her she needed to reschedule ?  She might be confused

## 2020-01-09 ENCOUNTER — Other Ambulatory Visit: Payer: Self-pay

## 2020-01-09 MED ORDER — RIVAROXABAN 20 MG PO TABS: 20.0000 mg | ORAL_TABLET | Freq: Every day | ORAL | 2 refills | Status: DC

## 2020-01-09 NOTE — Telephone Encounter (Signed)
Attempted to call pt and there was no answer and unable to leave voice mail.

## 2020-01-09 NOTE — Telephone Encounter (Signed)
Xarelto 20mg  refill request received. Pt is 79 years old, weight-84.8kg, Crea-0.99 on 12/04/2019, last seen by Dr.Nishan on 09/13/2019, Diagnosis-Afib, CrCl-59.85ml/min; Dose is appropriate based on dosing criteria. Will send in refill to requested pharmacy.

## 2020-01-10 ENCOUNTER — Encounter: Payer: Self-pay | Admitting: Family Medicine

## 2020-01-10 ENCOUNTER — Ambulatory Visit (INDEPENDENT_AMBULATORY_CARE_PROVIDER_SITE_OTHER): Payer: Medicare HMO | Admitting: Family Medicine

## 2020-01-10 ENCOUNTER — Other Ambulatory Visit: Payer: Self-pay

## 2020-01-10 VITALS — BP 146/69 | HR 53 | Temp 97.2°F | Ht 63.0 in | Wt 188.0 lb

## 2020-01-10 DIAGNOSIS — R3121 Asymptomatic microscopic hematuria: Secondary | ICD-10-CM

## 2020-01-10 DIAGNOSIS — I10 Essential (primary) hypertension: Secondary | ICD-10-CM

## 2020-01-10 DIAGNOSIS — E119 Type 2 diabetes mellitus without complications: Secondary | ICD-10-CM | POA: Diagnosis not present

## 2020-01-10 DIAGNOSIS — R519 Headache, unspecified: Secondary | ICD-10-CM | POA: Diagnosis not present

## 2020-01-10 DIAGNOSIS — E785 Hyperlipidemia, unspecified: Secondary | ICD-10-CM | POA: Diagnosis not present

## 2020-01-10 LAB — POCT URINALYSIS DIP (MANUAL ENTRY)
Bilirubin, UA: NEGATIVE
Glucose, UA: 250 mg/dL — AB
Ketones, POC UA: NEGATIVE mg/dL
Leukocytes, UA: NEGATIVE
Nitrite, UA: NEGATIVE
Protein Ur, POC: NEGATIVE mg/dL
Spec Grav, UA: 1.025 (ref 1.010–1.025)
Urobilinogen, UA: 0.2 E.U./dL
pH, UA: 5.5 (ref 5.0–8.0)

## 2020-01-10 LAB — POC MICROSCOPIC URINALYSIS (UMFC): Mucus: ABSENT

## 2020-01-10 MED ORDER — GLIPIZIDE 10 MG PO TABS: ORAL_TABLET | ORAL | 1 refills | Status: DC

## 2020-01-10 MED ORDER — SIMVASTATIN 20 MG PO TABS: ORAL_TABLET | ORAL | 2 refills | Status: DC

## 2020-01-10 MED ORDER — METFORMIN HCL ER 500 MG PO TB24: ORAL_TABLET | ORAL | 1 refills | Status: DC

## 2020-01-10 MED ORDER — IRBESARTAN 300 MG PO TABS: 300.0000 mg | ORAL_TABLET | Freq: Every day | ORAL | 1 refills | Status: DC

## 2020-01-10 MED ORDER — HYDROCHLOROTHIAZIDE 25 MG PO TABS: 25.0000 mg | ORAL_TABLET | Freq: Every day | ORAL | 1 refills | Status: DC

## 2020-01-10 MED ORDER — NYSTATIN 100000 UNIT/GM EX CREA: TOPICAL_CREAM | CUTANEOUS | 5 refills | Status: DC

## 2020-01-10 NOTE — Progress Notes (Signed)
7/22/20213:48 PM  GURPREET MARIANI September 18, 1940, 79 y.o., female [id number]  Chief Complaint  Patient presents with  . chronic headache    5 days on and off , has appt w/ dentist for a bad tooth/ has an appt with ENT/ has monthly eye injection   . Medication Refill    humana 90 days     HPI:   Patient is a 79 y.o. 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) She is having tooth issues - seeing dentist She was seen in [place] on June 15 - neg head CT scan, normal CBC and BMP Fasting cbgs today 108  Lab Results  Component Value Date   HGBA1C 6.2 (H) 06/19/2019   HGBA1C CANCELED 05/24/2019   HGBA1C 6.6 (H) 11/22/2018   Lab Results  Component Value Date   MICROALBUR 2.0 08/28/2015   LDLCALC 85 05/24/2019   CREATININE 0.99 12/04/2019   Wt Readings from Last 3 Encounters:  01/10/20 188 lb (85.3 kg)  09/13/19 187 lb (84.8 kg)  08/06/19 187 lb (84.8 kg)   BP Readings from Last 3 Encounters:  01/10/20 (!) 146/69  12/04/19 (!) 156/86  09/13/19 134/70    Depression screen PHQ 2/9 01/10/2020 10/08/2019 08/06/2019  Decreased Interest 0 0 0  Down, Depressed, Hopeless 0 0 0  PHQ - 2 Score 0 0 0    Fall Risk  01/10/2020 10/08/2019 08/06/2019 05/24/2019 05/04/2019  Falls in the past year? 0 0 0 0 0  Number falls in past yr: 0 0 0 0 0  Injury with Fall? 0 0 0 0 0  Comment - - - - -  Risk for fall due to : - - -  - -  Risk for fall due to: Comment - - - - -  Follow up Falls evaluation completed Falls evaluation completed - - -     Allergies  Allergen Reactions  . Codeine Other (See Comments)    vertigo    Prior to Admission medications   Medication Sig Start Date End Date Taking? Authorizing Provider  acetaminophen (TYLENOL) 500 MG tablet Take 1 tablet (500 mg total) by mouth every 6 (six) hours as needed. 11/19/16  Yes Shawnee Knapp, MD  Alcohol Swabs (B-D SINGLE USE SWABS REGULAR) PADS USE TO CHECK BLOOD SUGAR DAILY  06/03/17  Yes Shawnee Knapp, MD  Blood Glucose Calibration (SURECHEK CONTROL SOLUTION) Normal LIQD Use to check blood sugar daily. Dx E11.65 09/08/15  Yes Shawnee Knapp, MD  blood glucose meter kit and supplies Use to test blood sugar once daily. Dx: E11.65 02/27/18  Yes Shawnee Knapp, MD  Blood Glucose Monitoring Suppl (TRUE METRIX AIR GLUCOSE METER) DEVI 1 each by Does not apply route daily. Use to test blood sugar once daily. Dx: E11.65 09/09/15  Yes Ivar Drape D, PA  cloNIDine (CATAPRES) 0.1 MG tablet TAKE 1 TABLET AT BEDTIME Patient taking differently: Take 0.1 mg  by mouth at bedtime.  09/18/19  Yes Myles Lipps, MD  flecainide (TAMBOCOR) 100 MG tablet TAKE 1/2 TABLET TWICE DAILY Patient taking differently: Take 50 mg by mouth 2 (two) times daily.  07/24/19  Yes Wendall Stade, MD  glipiZIDE (GLUCOTROL) 10 MG tablet TAKE 1 TABLET TWICE DAILY BEFORE A MEAL Patient taking differently: Take 10 mg by mouth 2 (two) times daily before a meal.  09/18/19  Yes Myles Lipps, MD  irbesartan-hydrochlorothiazide (AVALIDE) 300-12.5 MG tablet TAKE 1 TABLET EVERY DAY Patient taking differently: Take 1 tablet by mouth daily.  09/18/19  Yes Myles Lipps, MD  metFORMIN (GLUCOPHAGE-XR) 500 MG 24 hr tablet TAKE 2 TABLETS DAILY WITH BREAKFAST Patient taking differently: Take 1,000 mg by mouth daily with breakfast.  09/18/19  Yes Myles Lipps, MD  nystatin cream (MYCOSTATIN) APPLY EXTERNALLY TO  THE AFFECTED AREA TWICE DAILY 06/19/19  Yes Myles Lipps, MD  polyethylene glycol powder (GLYCOLAX/MIRALAX) 17 GM/SCOOP powder Take 17 g by mouth 2 (two) times daily as needed. 05/24/19  Yes Myles Lipps, MD  polyethylene glycol powder (GLYCOLAX/MIRALAX) powder Take 17 g by mouth 2 (two) times daily as needed. 11/19/16  Yes Sherren Mocha, MD  rivaroxaban (XARELTO) 20 MG TABS tablet Take 1 tablet (20 mg total) by mouth daily with supper. 01/09/20  Yes Wendall Stade, MD  simvastatin (ZOCOR) 20 MG tablet TAKE 1 TABLET EVERY DAY AT 6:00PM Patient taking differently: Take 20 mg by mouth daily at 6 PM.  09/18/19  Yes Myles Lipps, MD  TRUE METRIX BLOOD GLUCOSE TEST test strip TEST BLOOD SUGAR ONE TIME DAILY 06/03/17  Yes Sherren Mocha, MD  TRUEplus Lancets 28G MISC USE TO TEST BLOOD SUGAR ONCE DAILY. 08/31/19  Yes Myles Lipps, MD    Past Medical History:  Diagnosis Date  . Anemia   . Arthritis   . Bradycardia   . Cataract   . Chronic kidney disease   . Diabetes mellitus type II   . Hypertension   . Osteoporosis   . Syncope   . Weakness     Past Surgical History:  Procedure Laterality Date  . ABDOMINAL HYSTERECTOMY    . CESAREAN SECTION    . COLON SURGERY    . EYE SURGERY    . FRACTURE SURGERY      Social History   Tobacco Use  . Smoking status: Never Smoker  . Smokeless tobacco: Never Used  Substance Use Topics  . Alcohol use: No    Family History  Problem Relation Age of Onset  . Diabetes Sister   . Hyperlipidemia Sister   . Hypertension Sister   . Diabetes Brother   . Hyperlipidemia Brother   . Hypertension Brother     ROS Per hpi  OBJECTIVE:  Today's Vitals   01/10/20 1529  BP: (!) 146/69  Pulse: 53  Temp: (!) 97.2 F (36.2 C)  SpO2: 100%  Weight: 188 lb (85.3 kg)  Height: 5\' 3"  (1.6 m)   Body mass index is 33.3 kg/m.   Physical Exam Vitals and nursing note reviewed.  Constitutional:      Appearance: She is well-developed.  HENT:      Head: Normocephalic and atraumatic.     Jaw: No tenderness.     Comments: TA pulsatile and non tender    Mouth/Throat:     Pharynx: No oropharyngeal exudate.  Eyes:     General: No scleral icterus.    Extraocular  Movements: Extraocular movements intact.     Conjunctiva/sclera: Conjunctivae normal.     Pupils: Pupils are equal, round, and reactive to light.  Cardiovascular:     Rate and Rhythm: Normal rate and regular rhythm.     Heart sounds: Normal heart sounds. No murmur heard.  No friction rub. No gallop.   Pulmonary:     Effort: Pulmonary effort is normal.     Breath sounds: Normal breath sounds. No wheezing, rhonchi or rales.  Musculoskeletal:     Cervical back: Neck supple.     Right lower leg: No edema.     Left lower leg: No edema.  Skin:    General: Skin is warm and dry.  Neurological:     Mental Status: She is alert and oriented to person, place, and time.     Cranial Nerves: No cranial nerve deficit.     Motor: No weakness.     Coordination: Coordination normal.     Gait: Gait normal.     Deep Tendon Reflexes: Reflexes normal.     Results for orders placed or performed in visit on 01/10/20 (from the past 24 hour(s))  POCT urinalysis dipstick     Status: Abnormal   Collection Time: 01/10/20  3:53 PM  Result Value Ref Range   Color, UA yellow yellow   Clarity, UA clear clear   Glucose, UA =250 (A) negative mg/dL   Bilirubin, UA negative negative   Ketones, POC UA negative negative mg/dL   Spec Grav, UA 1.025 1.010 - 1.025   Blood, UA moderate (A) negative   pH, UA 5.5 5.0 - 8.0   Protein Ur, POC negative negative mg/dL   Urobilinogen, UA 0.2 0.2 or 1.0 E.U./dL   Nitrite, UA Negative Negative   Leukocytes, UA Negative Negative  POCT Microscopic Urinalysis ([facility])     Status: Abnormal   Collection Time: 01/10/20  4:19 PM  Result Value Ref Range   WBC,UR,HPF,POC None None WBC/hpf   RBC,UR,HPF,POC Moderate (A) None RBC/hpf   Bacteria None None, Too numerous  to count   Mucus Absent Absent   Epithelial Cells, UR Per Microscopy Few (A) None, Too numerous to count cells/hpf    No results found.   ASSESSMENT and PLAN  1. Type 2 diabetes mellitus without complication, without long-term current use of insulin ([place]) Checking labs today, medications will be adjusted as needed.  - Hemoglobin A1c - POCT urinalysis dipstick - glipiZIDE (GLUCOTROL) 10 MG tablet; TAKE 1 TABLET TWICE DAILY BEFORE A MEAL - CMP14+EGFR  2. Dyslipidemia Checking labs today, medications will be adjusted as needed.  - simvastatin (ZOCOR) 20 MG tablet; TAKE 1 TABLET EVERY DAY AT 6:00PM - Lipid panel  3. New onset of headaches after age 44 Non focal exam. Labs pending, neg CT scan, referring to neuro - CBC with Differential/Platelet - Sedimentation Rate - Ambulatory referral to Neurology  4. Essential hypertension Not at goal. Increase HCTZ to '25mg'$ , patient aware that medication needs to be separated from irbesartan.  5. Asymptomatic microscopic hematuria managed by urology. - POCT Microscopic Urinalysis ([facility])  Other orders - metFORMIN (GLUCOPHAGE-XR) 500 MG 24 hr tablet; TAKE 2 TABLETS DAILY WITH BREAKFAST - irbesartan (AVAPRO) 300 MG tablet; Take 1 tablet (300 mg total) by mouth daily. - hydrochlorothiazide (HYDRODIURIL) 25 MG tablet; Take 1 tablet (25 mg total) by mouth daily. - nystatin cream (MYCOSTATIN); APPLY EXTERNALLY TO THE AFFECTED AREA TWICE DAILY  Return in about 3 months (around 04/11/2020).    Raywood Wailes  M Santiago, MD [facility] [address] [place], [place] [address] Ph.  [phone] Fax [phone]   

## 2020-01-10 NOTE — Telephone Encounter (Signed)
Pt had no additional concerns she wants to see the provider. She will she will be seen at 3 pm today. Pt stated that she is on her way.

## 2020-01-10 NOTE — Patient Instructions (Signed)

## 2020-01-11 LAB — LIPID PANEL
Chol/HDL Ratio: 2.8 ratio (ref 0.0–4.4)
Cholesterol, Total: 179 mg/dL (ref 100–199)
HDL: 63 mg/dL (ref >39)
LDL Chol Calc (NIH): 100 mg/dL — ABNORMAL HIGH (ref 0–99)
Triglycerides: 86 mg/dL (ref 0–149)
VLDL Cholesterol Cal: 16 mg/dL (ref 5–40)

## 2020-01-11 LAB — CBC WITH DIFFERENTIAL/PLATELET
Basophils Absolute: 0 10*3/uL (ref 0.0–0.2)
Basos: 1 %
EOS (ABSOLUTE): 0.1 10*3/uL (ref 0.0–0.4)
Eos: 2 %
Hematocrit: 40.7 % (ref 34.0–46.6)
Hemoglobin: 13 g/dL (ref 11.1–15.9)
Immature Grans (Abs): 0 10*3/uL (ref 0.0–0.1)
Immature Granulocytes: 0 %
Lymphocytes Absolute: 2.2 10*3/uL (ref 0.7–3.1)
Lymphs: 54 %
MCH: 27.5 pg (ref 26.6–33.0)
MCHC: 31.9 g/dL (ref 31.5–35.7)
MCV: 86 fL (ref 79–97)
Monocytes Absolute: 0.4 10*3/uL (ref 0.1–0.9)
Monocytes: 9 %
Neutrophils Absolute: 1.4 10*3/uL (ref 1.4–7.0)
Neutrophils: 34 %
Platelets: 179 10*3/uL (ref 150–450)
RBC: 4.72 x10E6/uL (ref 3.77–5.28)
RDW: 13.6 % (ref 11.7–15.4)
WBC: 4 10*3/uL (ref 3.4–10.8)

## 2020-01-11 LAB — CMP14+EGFR
ALT: 8 IU/L (ref 0–32)
AST: 15 IU/L (ref 0–40)
Albumin/Globulin Ratio: 1.1 — ABNORMAL LOW (ref 1.2–2.2)
Albumin: 4.1 g/dL (ref 3.6–4.6)
Alkaline Phosphatase: 98 IU/L (ref 48–121)
BUN/Creatinine Ratio: 14 (ref 12–28)
BUN: 17 mg/dL (ref 8–27)
Bilirubin Total: 0.3 mg/dL (ref 0.0–1.2)
CO2: 24 mmol/L (ref 20–29)
Calcium: 9.4 mg/dL (ref 8.7–10.3)
Chloride: 104 mmol/L (ref 96–106)
Creatinine, Ser: 1.23 mg/dL — ABNORMAL HIGH (ref 0.57–1.00)
GFR calc Af Amer: 48 mL/min/{1.73_m2} — ABNORMAL LOW (ref >59)
GFR calc non Af Amer: 41 mL/min/{1.73_m2} — ABNORMAL LOW (ref >59)
Globulin, Total: 3.9 g/dL (ref 1.5–4.5)
Glucose: 129 mg/dL — ABNORMAL HIGH (ref 65–99)
Potassium: 4.5 mmol/L (ref 3.5–5.2)
Sodium: 142 mmol/L (ref 134–144)
Total Protein: 8 g/dL (ref 6.0–8.5)

## 2020-01-11 LAB — HEMOGLOBIN A1C
Est. average glucose Bld gHb Est-mCnc: 143 mg/dL
Hgb A1c MFr Bld: 6.6 % — ABNORMAL HIGH (ref 4.8–5.6)

## 2020-01-11 LAB — SEDIMENTATION RATE: Sed Rate: 34 mm/hr (ref 0–40)

## 2020-01-15 ENCOUNTER — Other Ambulatory Visit: Payer: Self-pay | Admitting: Family Medicine

## 2020-01-17 ENCOUNTER — Telehealth: Payer: Self-pay | Admitting: *Deleted

## 2020-01-17 NOTE — Telephone Encounter (Signed)
    [facility] Pre-operative Risk Assessment    HEARTCARE STAFF: - Please ensure there is not already an duplicate clearance open for this procedure. - Under Visit Info/Reason for Call, type in Other and utilize the format Clearance MM/DD/YY or Clearance TBD. Do not use dashes or single digits. - If request is for dental extraction, please clarify the # of teeth to be extracted.  Request for surgical clearance:  1. What type of surgery is being performed? 1 TOOTH TO BE EXTRACTED   2. When is this surgery scheduled? TBD   3. What type of clearance is required (medical clearance vs. Pharmacy clearance to hold med vs. Both)? BOTH  4. Are there any medications that need to be held prior to surgery and how long? Stem   5. Practice name and name of physician performing surgery? [facility]; DR. CROSS  6. What is the office phone number? [phone]   7.   What is the office fax number? [phone]  8.   Anesthesia type (None, local, MAC, general) ? IV SEDATION   Julaine Hua 01/17/2020, 12:56 PM  _________________________________________________________________   (provider comments below)

## 2020-01-17 NOTE — Telephone Encounter (Signed)
   Primary Cardiologist: Jenkins Rouge, MD  Chart reviewed as part of pre-operative protocol coverage. Simple dental extractions are considered low risk procedures per guidelines and generally do not require any specific cardiac clearance. It is also generally accepted that for simple extractions and dental cleanings, there is no need to interrupt blood thinner therapy.   SBE prophylaxis is not required for the patient.  I will route this recommendation to the requesting party via Epic fax function and remove from pre-op pool.  Please call with questions.  Deberah Pelton, NP 01/17/2020, 1:52 PM

## 2020-01-22 DIAGNOSIS — R6889 Other general symptoms and signs: Secondary | ICD-10-CM | POA: Diagnosis not present

## 2020-01-22 DIAGNOSIS — H34831 Tributary (branch) retinal vein occlusion, right eye, with macular edema: Secondary | ICD-10-CM | POA: Diagnosis not present

## 2020-01-29 ENCOUNTER — Encounter: Payer: Self-pay | Admitting: Neurology

## 2020-02-07 ENCOUNTER — Other Ambulatory Visit: Payer: Self-pay

## 2020-02-07 ENCOUNTER — Telehealth: Payer: Self-pay | Admitting: *Deleted

## 2020-02-07 MED ORDER — RIVAROXABAN 20 MG PO TABS: 20.0000 mg | ORAL_TABLET | Freq: Every day | ORAL | 2 refills | Status: DC

## 2020-02-07 NOTE — Telephone Encounter (Signed)
Pt calling requesting medication Xarelto be sent to a different pharmacy, medication was sent to [facility] mail order pharmacy. Pt wanted it sent to [facility]. I resent medication to the correct pharmacy as requested. I advised the pt that if she has any other problems, questions or concerns, to please give our office a call. Pt verbalized understanding.

## 2020-02-07 NOTE — Telephone Encounter (Signed)
Schedule AWV.  

## 2020-02-08 DIAGNOSIS — H20011 Primary iridocyclitis, right eye: Secondary | ICD-10-CM | POA: Diagnosis not present

## 2020-02-08 DIAGNOSIS — H34831 Tributary (branch) retinal vein occlusion, right eye, with macular edema: Secondary | ICD-10-CM | POA: Diagnosis not present

## 2020-02-08 DIAGNOSIS — H35373 Puckering of macula, bilateral: Secondary | ICD-10-CM | POA: Diagnosis not present

## 2020-02-08 DIAGNOSIS — H35033 Hypertensive retinopathy, bilateral: Secondary | ICD-10-CM | POA: Diagnosis not present

## 2020-02-11 ENCOUNTER — Other Ambulatory Visit: Payer: Self-pay | Admitting: Cardiovascular Disease

## 2020-02-11 ENCOUNTER — Telehealth: Payer: Self-pay | Admitting: Cardiovascular Disease

## 2020-02-11 DIAGNOSIS — H20011 Primary iridocyclitis, right eye: Secondary | ICD-10-CM | POA: Diagnosis not present

## 2020-02-11 DIAGNOSIS — I10 Essential (primary) hypertension: Secondary | ICD-10-CM

## 2020-02-11 NOTE — Telephone Encounter (Signed)
**Note De-Identified Catherine Joseph Obfuscation** The pt states that [facility] is charging her $400 for a 30 day supply of Xarelto and that she cannot afford it. She also states that she has applied for pt asst through [employer] but was denied.  She has been out of Xarelto since Friday 8/20.  We discussed Warfarin and she states that if it is ok with Dr Johnsie Cancel for her to take it she will.  She is aware that someone will be calling to discuss her switching over to Warfarin from Xarelto.

## 2020-02-11 NOTE — Telephone Encounter (Signed)
Prescription refill request for Xarelto received.   Last office visit: 09/13/2019, Catherine Joseph: 85.3 kg Age: 79 y.o. Scr: 1.23, 01/10/2020 CrCl: 48 ml/min

## 2020-02-11 NOTE — Telephone Encounter (Signed)
Lyn- did you already discuss the [place] select program?

## 2020-02-11 NOTE — Telephone Encounter (Signed)
Hey Lynn, LPN, can you please advise on this matter? Thanks  ?

## 2020-02-11 NOTE — Telephone Encounter (Signed)
°*  STAT* If patient is at the pharmacy, call can be transferred to refill team.   1. Which medications need to be refilled? (please list name of each medication and dose if known) rivaroxaban (XARELTO) 20 MG TABS tablet  2. Which pharmacy/location (including street and city if local pharmacy) is medication to be sent to? [place], [place]  3. Do they need a 30 day or 90 day supply? 90 day Patient is out of meds.

## 2020-02-11 NOTE — Telephone Encounter (Signed)
Last scr was 1.23 (which up a little from baseline of about 1) This gives pt an ABW crcl of 48. Patient currently on 20mg  of Xarelto.  I would recommend since last scr draw was a little higher than baseline to keep on Xarelto 20mg   Until scr is repeated.  Patient's HCTZ was increased on 7/22- No repeat BMP has been done. If repeat ABW crcl is still <50 than dose will need to be reduced. I will verify with Dr. Johnsie Cancel if he agrees with plan. Patient has been enrolled in [employer]. If dose is changed, will need to inform them.

## 2020-02-11 NOTE — Telephone Encounter (Signed)
**Note De-Identified Eddrick Dilone Obfuscation** The pt states that she can afford $85 a month for her Xarelto.   She is aware that I did go to [employer] and registered her for the program.  She is aware that of the following instructions per [facility]:  [facility] will call to verify your information and collect your payment information, so make sure you have all of your important documents--like your credit card and insurance information--in one place. After confirming your payment details, the pharmacy will contact your doctor for a new prescription. Once it has been received, your medication will be mailed to you.  I did give [facility] pharmacy's phone number: [phone] so she can call if she has not heard from them by tomorrow.  Also, she is aware that we are leaving her 2 bottles of Xarelto samples up front in Dr Mariana Arn office for her to pick up.  She thanked me for assisting her with the cost of her Xarelto.

## 2020-02-11 NOTE — Telephone Encounter (Signed)
Patient calling the office for samples of medication:   1.  What medication and dosage are you requesting samples for? rivaroxaban (XARELTO) 20 MG TABS tablet  2.  Are you currently out of this medication? Yes   

## 2020-02-12 NOTE — Telephone Encounter (Signed)
Called patient. She will come in for lab this Thursday. Based off of her scr will determine if dose reduction is needed. Patient is aware of this.

## 2020-02-14 ENCOUNTER — Other Ambulatory Visit: Payer: Medicare HMO | Admitting: *Deleted

## 2020-02-14 ENCOUNTER — Other Ambulatory Visit: Payer: Self-pay

## 2020-02-14 DIAGNOSIS — I10 Essential (primary) hypertension: Secondary | ICD-10-CM

## 2020-02-15 ENCOUNTER — Other Ambulatory Visit: Payer: Self-pay

## 2020-02-15 LAB — BASIC METABOLIC PANEL
BUN/Creatinine Ratio: 20 (ref 12–28)
BUN: 21 mg/dL (ref 8–27)
CO2: 26 mmol/L (ref 20–29)
Calcium: 9.7 mg/dL (ref 8.7–10.3)
Chloride: 101 mmol/L (ref 96–106)
Creatinine, Ser: 1.06 mg/dL — ABNORMAL HIGH (ref 0.57–1.00)
GFR calc Af Amer: 57 mL/min/{1.73_m2} — ABNORMAL LOW (ref >59)
GFR calc non Af Amer: 49 mL/min/{1.73_m2} — ABNORMAL LOW (ref >59)
Glucose: 223 mg/dL — ABNORMAL HIGH (ref 65–99)
Potassium: 4.1 mmol/L (ref 3.5–5.2)
Sodium: 140 mmol/L (ref 134–144)

## 2020-02-15 MED ORDER — RIVAROXABAN 20 MG PO TABS: 20.0000 mg | ORAL_TABLET | Freq: Every day | ORAL | 3 refills | Status: DC

## 2020-02-15 NOTE — Telephone Encounter (Addendum)
**Note De-Identified Catherine Joseph Obfuscation** The pt states that she has s/w [employer] and was advised that they now need a RX for her Xarelto. I called [employer] and s/w Shawn who advised me that they are needing the pts Xarelto RX sent to [place] and that it can be e-scribed to them.  I had questions for [place] so [place] and I were on hold for more than 25 mins and were never able tos/w anyone.  I have e-scribed the pts Xarelto 20 mg take 1 tablet daily # 90 with 3 refills to [facility] home shipping pharmacy at [phone] with a message to the pharmacist that if they need anything else from [place] to please call [name] at [phone] and to otherwise ship Xarelto to the pts address per [facility] recommendation.  The pt is aware of all of this and is advised to call them back next week if she has not heard from them to be sure that her RX was received and filled.  She verbalized understanding and thank me for taking care of this for her.

## 2020-02-15 NOTE — Telephone Encounter (Signed)
Repeat Scr was 1.06. ABW crcl is now 56.05. Patient will remain on Xarelto 20mg  daily. Rx sent to [place] as part of the [employer].  Patient states when she came yesterday that there were no samples for her. I checked the bin and the sampes are there waiting for her to pick up.

## 2020-02-15 NOTE — Telephone Encounter (Signed)
Patient said that she is calling back about Xarelto and would like to speak with nurse.

## 2020-02-17 ENCOUNTER — Other Ambulatory Visit: Payer: Self-pay | Admitting: Cardiovascular Disease

## 2020-02-18 NOTE — Telephone Encounter (Addendum)
Refill was sent on 02/15/2020 to [facility] by [name], [place]. Will need to call to see if received by Pharmacy or call pt to see if she wants it local.    Xarelto 20mg  refill request received. Pt is 79 years old, weight-85.3kg, Crea-1.06 on 8/26/201, last seen by Dr. Johnsie Cancel on 09/13/2019, Diagnosis-Afib, CrCl-56.36m/min; Dose is appropriate based on dosing criteria.   [place] since that is where the original prescription was sent per [facility] LPN, and had to leave a message for them to call back. [facility] pharmacy called back within 10 minutes and confirmed they received the refill on the pt for Xarelto 20mg  daily and that I did not need to send another one and they are aware I will deny this one.

## 2020-02-26 DIAGNOSIS — H31011 Macula scars of posterior pole (postinflammatory) (post-traumatic), right eye: Secondary | ICD-10-CM | POA: Diagnosis not present

## 2020-02-26 DIAGNOSIS — H20011 Primary iridocyclitis, right eye: Secondary | ICD-10-CM | POA: Diagnosis not present

## 2020-02-26 DIAGNOSIS — H35373 Puckering of macula, bilateral: Secondary | ICD-10-CM | POA: Diagnosis not present

## 2020-03-10 NOTE — Progress Notes (Signed)
Lab visit only. 

## 2020-03-18 ENCOUNTER — Telehealth: Payer: Self-pay | Admitting: Family Medicine

## 2020-03-18 ENCOUNTER — Telehealth: Payer: Self-pay | Admitting: *Deleted

## 2020-03-18 DIAGNOSIS — H20011 Primary iridocyclitis, right eye: Secondary | ICD-10-CM | POA: Diagnosis not present

## 2020-03-18 DIAGNOSIS — H35373 Puckering of macula, bilateral: Secondary | ICD-10-CM | POA: Diagnosis not present

## 2020-03-18 DIAGNOSIS — H31011 Macula scars of posterior pole (postinflammatory) (post-traumatic), right eye: Secondary | ICD-10-CM | POA: Diagnosis not present

## 2020-03-18 NOTE — Telephone Encounter (Signed)
Pt is ready to schedule awv returning your call / please try her again

## 2020-03-18 NOTE — Telephone Encounter (Signed)
SCHEDULE awv

## 2020-04-01 ENCOUNTER — Other Ambulatory Visit: Payer: Self-pay

## 2020-04-01 ENCOUNTER — Ambulatory Visit (INDEPENDENT_AMBULATORY_CARE_PROVIDER_SITE_OTHER): Payer: Medicare HMO | Admitting: Emergency Medicine

## 2020-04-01 ENCOUNTER — Encounter: Payer: Self-pay | Admitting: Emergency Medicine

## 2020-04-01 VITALS — BP 134/76 | HR 65 | Temp 97.7°F | Resp 15 | Ht 63.0 in | Wt 189.0 lb

## 2020-04-01 DIAGNOSIS — I482 Chronic atrial fibrillation, unspecified: Secondary | ICD-10-CM

## 2020-04-01 DIAGNOSIS — Z23 Encounter for immunization: Secondary | ICD-10-CM | POA: Diagnosis not present

## 2020-04-01 DIAGNOSIS — R350 Frequency of micturition: Secondary | ICD-10-CM

## 2020-04-01 DIAGNOSIS — R351 Nocturia: Secondary | ICD-10-CM

## 2020-04-01 DIAGNOSIS — E1165 Type 2 diabetes mellitus with hyperglycemia: Secondary | ICD-10-CM | POA: Diagnosis not present

## 2020-04-01 LAB — POCT URINALYSIS DIP (MANUAL ENTRY)
Bilirubin, UA: NEGATIVE
Glucose, UA: NEGATIVE mg/dL
Nitrite, UA: NEGATIVE
Protein Ur, POC: NEGATIVE mg/dL
Spec Grav, UA: 1.025 (ref 1.010–1.025)
Urobilinogen, UA: 0.2 E.U./dL
pH, UA: 5.5 (ref 5.0–8.0)

## 2020-04-01 NOTE — Progress Notes (Addendum)
[name] 79 y.o.   Chief Complaint  Patient presents with  . Establish Care    pt concerned she is urinating too frequently     HISTORY OF PRESENT ILLNESS: This is a 79 y.o. female complaining of urinary frequency for months. Used to see Dr. Pamella Pert. Here to establish care with me. Past medical history and active problem list reviewed with patient. No other complaints or medical concerns today. Has been taking double dose of olmesartan and hydrochlorothiazide without known so.  HPI   Prior to Admission medications   Medication Sig Start Date End Date Taking? Authorizing Provider  acetaminophen (TYLENOL) 500 MG tablet Take 1 tablet (500 mg total) by mouth every 6 (six) hours as needed. 11/19/16  Yes Shawnee Knapp, MD  Alcohol Swabs (B-D SINGLE USE SWABS REGULAR) PADS USE TO CHECK BLOOD SUGAR DAILY  06/03/17  Yes Shawnee Knapp, MD  Blood Glucose Calibration (SURECHEK CONTROL SOLUTION) Normal LIQD Use to check blood sugar daily. Dx E11.65 09/08/15  Yes Shawnee Knapp, MD  blood glucose meter kit and supplies Use to test blood sugar once daily. Dx: E11.65 02/27/18  Yes Shawnee Knapp, MD  Blood Glucose Monitoring Suppl (TRUE METRIX AIR GLUCOSE METER) DEVI 1 each by Does not apply route daily. Use to test blood sugar once daily. Dx: E11.65 09/09/15  Yes Ivar Drape D, PA  cloNIDine (CATAPRES) 0.1 MG tablet TAKE 1 TABLET AT BEDTIME 01/15/20  Yes Rutherford Guys, MD  flecainide (TAMBOCOR) 100 MG tablet TAKE 1/2 TABLET TWICE DAILY Patient taking differently: Take 50 mg by mouth 2 (two) times daily.  07/24/19  Yes Josue Hector, MD  glipiZIDE (GLUCOTROL) 10 MG tablet TAKE 1 TABLET TWICE DAILY BEFORE A MEAL 01/10/20  Yes Rutherford Guys, MD  hydrochlorothiazide (HYDRODIURIL) 25 MG tablet Take 1 tablet (25 mg total) by mouth daily. 01/10/20  Yes Rutherford Guys, MD  irbesartan (AVAPRO) 300 MG tablet Take 1 tablet (300 mg total) by mouth daily. 01/10/20  Yes Rutherford Guys, MD   irbesartan-hydrochlorothiazide (AVALIDE) 300-12.5 MG tablet TAKE 1 TABLET EVERY DAY Patient taking differently: Take 1 tablet by mouth daily.  09/18/19  Yes Rutherford Guys, MD  metFORMIN (GLUCOPHAGE-XR) 500 MG 24 hr tablet TAKE 2 TABLETS DAILY WITH BREAKFAST 01/10/20  Yes Rutherford Guys, MD  nystatin cream (MYCOSTATIN) APPLY EXTERNALLY TO THE AFFECTED AREA TWICE DAILY 01/10/20  Yes Rutherford Guys, MD  rivaroxaban (XARELTO) 20 MG TABS tablet Take 1 tablet (20 mg total) by mouth daily with supper. 02/15/20  Yes Josue Hector, MD  simvastatin (ZOCOR) 20 MG tablet TAKE 1 TABLET EVERY DAY AT 6:00PM 01/10/20  Yes Rutherford Guys, MD  TRUE METRIX BLOOD GLUCOSE TEST test strip TEST BLOOD SUGAR ONE TIME DAILY 06/03/17  Yes Shawnee Knapp, MD  TRUEplus Lancets 28G MISC USE TO TEST BLOOD SUGAR ONCE DAILY. 08/31/19  Yes Rutherford Guys, MD  polyethylene glycol powder (GLYCOLAX/MIRALAX) 17 GM/SCOOP powder Take 17 g by mouth 2 (two) times daily as needed. Patient not taking: Reported on 04/01/2020 05/24/19   Rutherford Guys, MD  polyethylene glycol powder (GLYCOLAX/MIRALAX) powder Take 17 g by mouth 2 (two) times daily as needed. Patient not taking: Reported on 04/01/2020 11/19/16   Shawnee Knapp, MD    Allergies  Allergen Reactions  . Codeine Other (See Comments)    vertigo    Patient Active Problem List   Diagnosis Date Noted  . Acute  left ankle pain 02/06/2019  . Osteopenia 04/01/2016  . Microhematuria 11/11/2014  . Atrial fibrillation ([place]) 04/22/2014  . 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 Lung nodule 02/01/2012  . Mixed hyperlipidemia 08/03/2011  . Bradycardia 08/03/2011  . Hypertension 07/22/2011  . Diabetes mellitus, type II ([place]) 07/22/2011    Past Medical History:  Diagnosis Date  . Anemia   . Arthritis   . Bradycardia   . Cataract   . Chronic kidney disease   . Diabetes mellitus type II   . Hypertension   . Osteoporosis   . Syncope   . Weakness     Past Surgical History:   Procedure Laterality Date  . ABDOMINAL HYSTERECTOMY    . CESAREAN SECTION    . COLON SURGERY    . EYE SURGERY    . FRACTURE SURGERY      Social History   Socioeconomic History  . Marital status: Widowed    Spouse name: Not on file  . Number of children: Not on file  . Years of education: Not on file  . Highest education level: Not on file  Occupational History  . Not on file  Tobacco Use  . Smoking status: Never Smoker  . Smokeless tobacco: Never Used  Vaping Use  . Vaping Use: Never used  Substance and Sexual Activity  . Alcohol use: No  . Drug use: No  . Sexual activity: Not Currently  Other Topics Concern  . Not on file  Social History Narrative  . Not on file   Social Determinants of Health   Financial Resource Strain:   . Difficulty of Paying Living Expenses: Not on file  Food Insecurity:   . Worried About Charity fundraiser in the Last Year: Not on file  . Ran Out of Food in the Last Year: Not on file  Transportation Needs:   . Lack of Transportation (Medical): Not on file  . Lack of Transportation (Non-Medical): Not on file  Physical Activity:   . Days of Exercise per Week: Not on file  . Minutes of Exercise per Session: Not on file  Stress:   . Feeling of Stress : Not on file  Social Connections:   . Frequency of Communication with Friends and Family: Not on file  . Frequency of Social Gatherings with Friends and Family: Not on file  . Attends Religious Services: Not on file  . Active Member of Clubs or Organizations: Not on file  . Attends Archivist Meetings: Not on file  . Marital Status: Not on file  Intimate Partner Violence:   . Fear of Current or Ex-Partner: Not on file  . Emotionally Abused: Not on file  . Physically Abused: Not on file  . Sexually Abused: Not on file    Family History  Problem Relation Age of Onset  . Diabetes Sister   . Hyperlipidemia Sister   . Hypertension Sister   . Diabetes Brother   .  Hyperlipidemia Brother   . Hypertension Brother      Review of Systems  Constitutional: Negative.  Negative for chills and fever.  HENT: Negative.  Negative for congestion and sore throat.   Respiratory: Negative.  Negative for cough and shortness of breath.   Cardiovascular: Negative.  Negative for chest pain and palpitations.  Gastrointestinal: Negative for abdominal pain, nausea and vomiting.  Genitourinary: Positive for frequency. Negative for dysuria, flank pain, hematuria and urgency.  Musculoskeletal: Negative.  Negative for myalgias and neck pain.  Skin:  Negative.  Negative for rash.  Neurological: Negative.  Negative for dizziness and headaches.  All other systems reviewed and are negative.  Today's Vitals   04/01/20 1602  BP: 134/76  Pulse: 65  Resp: 15  Temp: 97.7 F (36.5 C)  TempSrc: Temporal  SpO2: 99%  Weight: 189 lb (85.7 kg)  Height: 5' 3" (1.6 m)   Body mass index is 33.48 kg/m.   Physical Exam Vitals reviewed.  Constitutional:      Appearance: Normal appearance.  HENT:     Head: Normocephalic.  Eyes:     Extraocular Movements: Extraocular movements intact.     Pupils: Pupils are equal, round, and reactive to light.  Cardiovascular:     Rate and Rhythm: Normal rate and regular rhythm.     Heart sounds: Normal heart sounds.  Pulmonary:     Effort: Pulmonary effort is normal.     Breath sounds: Normal breath sounds.  Abdominal:     Palpations: Abdomen is soft.     Tenderness: There is no abdominal tenderness.  Musculoskeletal:        General: Normal range of motion.     Cervical back: Normal range of motion and neck supple.  Skin:    General: Skin is warm and dry.     Capillary Refill: Capillary refill takes less than 2 seconds.  Neurological:     General: No focal deficit present.     Mental Status: She is alert and oriented to person, place, and time.  Psychiatric:        Mood and Affect: Mood normal.        Behavior: Behavior normal.     Results for orders placed or performed in visit on 04/01/20 (from the past 24 hour(s))  POCT urinalysis dipstick     Status: Abnormal   Collection Time: 04/01/20  4:10 PM  Result Value Ref Range   Color, UA yellow yellow   Clarity, UA clear clear   Glucose, UA negative negative mg/dL   Bilirubin, UA negative negative   Ketones, POC UA trace (5) (A) negative mg/dL   Spec Grav, UA 1.025 1.010 - 1.025   Blood, UA small (A) negative   pH, UA 5.5 5.0 - 8.0   Protein Ur, POC negative negative mg/dL   Urobilinogen, UA 0.2 0.2 or 1.0 E.U./dL   Nitrite, UA Negative Negative   Leukocytes, UA Trace (A) Negative     ASSESSMENT & PLAN: Catherine Joseph was seen today for establish care.  Diagnoses and all orders for this visit:  Urinary frequency -     Urine Culture  Need for prophylactic vaccination and inoculation against influenza -     Flu Vaccine QUAD High Dose(Fluad)  Nocturia -     POCT urinalysis dipstick  Type 2 diabetes mellitus with hyperglycemia, without long-term current use of insulin (HCC) -     Comprehensive metabolic panel -     Hemoglobin A1c  Chronic atrial fibrillation (HCC)    Doses of irbesartan and hydrochlorothiazide medications adjusted. Urine culture sent and pending. No glucosuria, trace amount of ketones in the urine. No symptoms of UTI. Symptoms most likely related to excessive amount of diuretic.   Patient Instructions       If you have lab work done today you will be contacted with your lab results within the next 2 weeks.  If you have not heard from [place] then please contact us. The fastest way to get your results is to register for My  Chart.   IF you received an x-ray today, you will receive an invoice from [facility]. Please contact [facility] at [phone] with questions or concerns regarding your invoice.   IF you received labwork today, you will receive an invoice from [place]. Please contact [facility] at  [phone] with questions or concerns regarding your invoice.   Our billing staff will not be able to assist you with questions regarding bills from these companies.  You will be contacted with the lab results as soon as they are available. The fastest way to get your results is to activate your My Chart account. Instructions are located on the last page of this paperwork. If you have not heard from [place] regarding the results in 2 weeks, please contact this office.     Health Maintenance After Age 30 After age 52, you are at a higher risk for certain long-term diseases and infections as well as injuries from falls. Falls are a major cause of broken bones and head injuries in people who are older than age 3. Getting regular preventive care can help to keep you healthy and well. Preventive care includes getting regular testing and making lifestyle changes as recommended by your health care provider. Talk with your health care provider about:  Which screenings and tests you should have. A screening is a test that checks for a disease when you have no symptoms.  A diet and exercise plan that is right for you. What should I know about screenings and tests to prevent falls? Screening and testing are the best ways to find a health problem early. Early diagnosis and treatment give you the best chance of managing medical conditions that are common after age 22. Certain conditions and lifestyle choices may make you more likely to have a fall. Your health care provider may recommend:  Regular vision checks. Poor vision and conditions such as cataracts can make you more likely to have a fall. If you wear glasses, make sure to get your prescription updated if your vision changes.  Medicine review. Work with your health care provider to regularly review all of the medicines you are taking, including over-the-counter medicines. Ask your health care provider about any side effects that may make you more likely  to have a fall. Tell your health care provider if any medicines that you take make you feel dizzy or sleepy.  Osteoporosis screening. Osteoporosis is a condition that causes the bones to get weaker. This can make the bones weak and cause them to break more easily.  Blood pressure screening. Blood pressure changes and medicines to control blood pressure can make you feel dizzy.  Strength and balance checks. Your health care provider may recommend certain tests to check your strength and balance while standing, walking, or changing positions.  Foot health exam. Foot pain and numbness, as well as not wearing proper footwear, can make you more likely to have a fall.  Depression screening. You may be more likely to have a fall if you have a fear of falling, feel emotionally low, or feel unable to do activities that you used to do.  Alcohol use screening. Using too much alcohol can affect your balance and may make you more likely to have a fall. What actions can I take to lower my risk of falls? General instructions  Talk with your health care provider about your risks for falling. Tell your health care provider if: ? You fall. Be sure to tell your health care provider about  all falls, even ones that seem minor. ? You feel dizzy, sleepy, or off-balance.  Take over-the-counter and prescription medicines only as told by your health care provider. These include any supplements.  Eat a healthy diet and maintain a healthy weight. A healthy diet includes low-fat dairy products, low-fat (lean) meats, and fiber from whole grains, beans, and lots of fruits and vegetables. Home safety  Remove any tripping hazards, such as rugs, cords, and clutter.  Install safety equipment such as grab bars in bathrooms and safety rails on stairs.  Keep rooms and walkways well-lit. Activity   Follow a regular exercise program to stay fit. This will help you maintain your balance. Ask your health care provider what  types of exercise are appropriate for you.  If you need a cane or walker, use it as recommended by your health care provider.  Wear supportive shoes that have nonskid soles. Lifestyle  Do not drink alcohol if your health care provider tells you not to drink.  If you drink alcohol, limit how much you have: ? 0-1 drink a day for women. ? 0-2 drinks a day for men.  Be aware of how much alcohol is in your drink. In the [place] one drink equals one typical bottle of beer (12 oz), one-half glass of wine (5 oz), or one shot of hard liquor (1 oz).  Do not use any products that contain nicotine or tobacco, such as cigarettes and e-cigarettes. If you need help quitting, ask your health care provider. Summary  Having a healthy lifestyle and getting preventive care can help to protect your health and wellness after age 46.  Screening and testing are the best way to find a health problem early and help you avoid having a fall. Early diagnosis and treatment give you the best chance for managing medical conditions that are more common for people who are older than age 44.  Falls are a major cause of broken bones and head injuries in people who are older than age 45. Take precautions to prevent a fall at home.  Work with your health care provider to learn what changes you can make to improve your health and wellness and to prevent falls. This information is not intended to replace advice given to you by your health care provider. Make sure you discuss any questions you have with your health care provider. Document Revised: 09/28/2018 Document Reviewed: 04/20/2017 Elsevier Patient Education  2020 Elsevier Inc.       Agustina Caroli, MD [facility]

## 2020-04-01 NOTE — Patient Instructions (Addendum)
   If you have lab work done today you will be contacted with your lab results within the next 2 weeks.  If you have not heard from us then please contact us. The fastest way to get your results is to register for My Chart.   IF you received an x-ray today, you will receive an invoice from [facility]. Please contact [facility] at [phone] with questions or concerns regarding your invoice.   IF you received labwork today, you will receive an invoice from [facility]. Please contact [facility] at [phone] with questions or concerns regarding your invoice.   Our billing staff will not be able to assist you with questions regarding bills from these companies.  You will be contacted with the lab results as soon as they are available. The fastest way to get your results is to activate your My Chart account. Instructions are located on the last page of this paperwork. If you have not heard from us regarding the results in 2 weeks, please contact this office.     Health Maintenance After Age 65 After age 81, you are at a higher risk for certain long-term diseases and infections as well as injuries from falls. Falls are a major cause of broken bones and head injuries in people who are older than age 81. Getting regular preventive care can help to keep you healthy and well. Preventive care includes getting regular testing and making lifestyle changes as recommended by your health care provider. Talk with your health care provider about:  Which screenings and tests you should have. A screening is a test that checks for a disease when you have no symptoms.  A diet and exercise plan that is right for you. What should I know about screenings and tests to prevent falls? Screening and testing are the best ways to find a health problem early. Early diagnosis and treatment give you the best chance of managing medical conditions that are common after age 81. Certain conditions and  lifestyle choices may make you more likely to have a fall. Your health care provider may recommend:  Regular vision checks. Poor vision and conditions such as cataracts can make you more likely to have a fall. If you wear glasses, make sure to get your prescription updated if your vision changes.  Medicine review. Work with your health care provider to regularly review all of the medicines you are taking, including over-the-counter medicines. Ask your health care provider about any side effects that may make you more likely to have a fall. Tell your health care provider if any medicines that you take make you feel dizzy or sleepy.  Osteoporosis screening. Osteoporosis is a condition that causes the bones to get weaker. This can make the bones weak and cause them to break more easily.  Blood pressure screening. Blood pressure changes and medicines to control blood pressure can make you feel dizzy.  Strength and balance checks. Your health care provider may recommend certain tests to check your strength and balance while standing, walking, or changing positions.  Foot health exam. Foot pain and numbness, as well as not wearing proper footwear, can make you more likely to have a fall.  Depression screening. You may be more likely to have a fall if you have a fear of falling, feel emotionally low, or feel unable to do activities that you used to do.  Alcohol use screening. Using too much alcohol can affect your balance and may make you more likely to   have a fall. What actions can I take to lower my risk of falls? General instructions  Talk with your health care provider about your risks for falling. Tell your health care provider if: ? You fall. Be sure to tell your health care provider about all falls, even ones that seem minor. ? You feel dizzy, sleepy, or off-balance.  Take over-the-counter and prescription medicines only as told by your health care provider. These include any  supplements.  Eat a healthy diet and maintain a healthy weight. A healthy diet includes low-fat dairy products, low-fat (lean) meats, and fiber from whole grains, beans, and lots of fruits and vegetables. Home safety  Remove any tripping hazards, such as rugs, cords, and clutter.  Install safety equipment such as grab bars in bathrooms and safety rails on stairs.  Keep rooms and walkways well-lit. Activity   Follow a regular exercise program to stay fit. This will help you maintain your balance. Ask your health care provider what types of exercise are appropriate for you.  If you need a cane or walker, use it as recommended by your health care provider.  Wear supportive shoes that have nonskid soles. Lifestyle  Do not drink alcohol if your health care provider tells you not to drink.  If you drink alcohol, limit how much you have: ? 0-1 drink a day for women. ? 0-2 drinks a day for men.  Be aware of how much alcohol is in your drink. In the [place] one drink equals one typical bottle of beer (12 oz), one-half glass of wine (5 oz), or one shot of hard liquor (1 oz).  Do not use any products that contain nicotine or tobacco, such as cigarettes and e-cigarettes. If you need help quitting, ask your health care provider. Summary  Having a healthy lifestyle and getting preventive care can help to protect your health and wellness after age 81.  Screening and testing are the best way to find a health problem early and help you avoid having a fall. Early diagnosis and treatment give you the best chance for managing medical conditions that are more common for people who are older than age 81.  Falls are a major cause of broken bones and head injuries in people who are older than age 81. Take precautions to prevent a fall at home.  Work with your health care provider to learn what changes you can make to improve your health and wellness and to prevent falls. This information is not intended  to replace advice given to you by your health care provider. Make sure you discuss any questions you have with your health care provider. Document Revised: 09/28/2018 Document Reviewed: 04/20/2017 Elsevier Patient Education  2020 Elsevier Inc.  

## 2020-04-02 ENCOUNTER — Encounter: Payer: Self-pay | Admitting: Emergency Medicine

## 2020-04-02 LAB — COMPREHENSIVE METABOLIC PANEL
ALT: 12 IU/L (ref 0–32)
AST: 17 IU/L (ref 0–40)
Albumin/Globulin Ratio: 1 — ABNORMAL LOW (ref 1.2–2.2)
Albumin: 4 g/dL (ref 3.6–4.6)
Alkaline Phosphatase: 104 IU/L (ref 44–121)
BUN/Creatinine Ratio: 18 (ref 12–28)
BUN: 24 mg/dL (ref 8–27)
Bilirubin Total: 0.3 mg/dL (ref 0.0–1.2)
CO2: 23 mmol/L (ref 20–29)
Calcium: 9.6 mg/dL (ref 8.7–10.3)
Chloride: 101 mmol/L (ref 96–106)
Creatinine, Ser: 1.33 mg/dL — ABNORMAL HIGH (ref 0.57–1.00)
GFR calc Af Amer: 43 mL/min/{1.73_m2} — ABNORMAL LOW (ref >59)
GFR calc non Af Amer: 38 mL/min/{1.73_m2} — ABNORMAL LOW (ref >59)
Globulin, Total: 4 g/dL (ref 1.5–4.5)
Glucose: 166 mg/dL — ABNORMAL HIGH (ref 65–99)
Potassium: 4.2 mmol/L (ref 3.5–5.2)
Sodium: 138 mmol/L (ref 134–144)
Total Protein: 8 g/dL (ref 6.0–8.5)

## 2020-04-02 LAB — HEMOGLOBIN A1C
Est. average glucose Bld gHb Est-mCnc: 148 mg/dL
Hgb A1c MFr Bld: 6.8 % — ABNORMAL HIGH (ref 4.8–5.6)

## 2020-04-03 LAB — URINE CULTURE

## 2020-04-14 DIAGNOSIS — H6123 Impacted cerumen, bilateral: Secondary | ICD-10-CM | POA: Diagnosis not present

## 2020-04-15 DIAGNOSIS — H35373 Puckering of macula, bilateral: Secondary | ICD-10-CM | POA: Diagnosis not present

## 2020-04-15 DIAGNOSIS — H20041 Secondary noninfectious iridocyclitis, right eye: Secondary | ICD-10-CM | POA: Diagnosis not present

## 2020-04-15 DIAGNOSIS — H20011 Primary iridocyclitis, right eye: Secondary | ICD-10-CM | POA: Diagnosis not present

## 2020-04-15 DIAGNOSIS — H35033 Hypertensive retinopathy, bilateral: Secondary | ICD-10-CM | POA: Diagnosis not present

## 2020-04-15 DIAGNOSIS — H44111 Panuveitis, right eye: Secondary | ICD-10-CM | POA: Diagnosis not present

## 2020-04-15 DIAGNOSIS — H34831 Tributary (branch) retinal vein occlusion, right eye, with macular edema: Secondary | ICD-10-CM | POA: Diagnosis not present

## 2020-04-18 NOTE — Progress Notes (Signed)
NEUROLOGY CONSULTATION NOTE  Catherine Joseph MRN: [id number] DOB: Nov 01, 1940  Referring provider: Grant Fontana, MD Primary care provider: Horald Pollen, MD  Reason for consult:  headache  HISTORY OF PRESENT ILLNESS: Catherine Joseph is an 79 year old female with a fib, HTN, and type 2 DM who presents for headache.  History supplemented by ED and referring provider's notes.  In January, she developed blurred vision in her right eye.  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  She received injection in the eye.  She began having headaches afterwards.  Following the injection, she developed drooping of her right eyelid.  She describes severe right-sided pounding headache with mild photophobia but no phonophobia, osmophobia, jaw pain, phonophobia, vomiting, or autonomic symptoms.  They would last 60 minutes and occur every other day.  She was taking Tylenol one to two days a week.  She does not know what brought these on.  She was evaluated in the ED at [facility] on 12/04/2019 where CT head was performed, which was personally reviewed and was unremarkable.  Sed rate performed on 01/10/2020 was 34.  She reports no headaches for the past week, however.  PAST MEDICAL HISTORY: Past Medical History:  Diagnosis Date   Anemia    Arthritis    Bradycardia    Cataract    Chronic kidney disease    Diabetes mellitus type II    Hypertension    Osteoporosis    Syncope    Weakness     PAST SURGICAL HISTORY: Past Surgical History:  Procedure Laterality Date   ABDOMINAL HYSTERECTOMY     CESAREAN SECTION     COLON SURGERY     EYE SURGERY     FRACTURE SURGERY      MEDICATIONS: Current Outpatient Medications on File Prior to Visit  Medication Sig Dispense Refill   acetaminophen (TYLENOL) 500 MG tablet Take 1 tablet (500 mg total) by mouth every 6 (six) hours as needed. 30 tablet 0   Alcohol Swabs (B-D SINGLE USE SWABS  REGULAR) PADS USE TO CHECK BLOOD SUGAR DAILY  100 each 3   Blood Glucose Calibration (SURECHEK CONTROL SOLUTION) Normal LIQD Use to check blood sugar daily. Dx E11.65 1 each 3   blood glucose meter kit and supplies Use to test blood sugar once daily. Dx: E11.65 1 each 0   Blood Glucose Monitoring Suppl (TRUE METRIX AIR GLUCOSE METER) DEVI 1 each by Does not apply route daily. Use to test blood sugar once daily. Dx: E11.65 1 Device 0   cloNIDine (CATAPRES) 0.1 MG tablet TAKE 1 TABLET AT BEDTIME 90 tablet 0   flecainide (TAMBOCOR) 100 MG tablet TAKE 1/2 TABLET TWICE DAILY (Patient taking differently: Take 50 mg by mouth 2 (two) times daily. ) 90 tablet 2   glipiZIDE (GLUCOTROL) 10 MG tablet TAKE 1 TABLET TWICE DAILY BEFORE A MEAL 180 tablet 1   hydrochlorothiazide (HYDRODIURIL) 25 MG tablet Take 1 tablet (25 mg total) by mouth daily. 90 tablet 1   irbesartan (AVAPRO) 300 MG tablet Take 1 tablet (300 mg total) by mouth daily. 90 tablet 1   irbesartan-hydrochlorothiazide (AVALIDE) 300-12.5 MG tablet TAKE 1 TABLET EVERY DAY (Patient taking differently: Take 1 tablet by mouth daily. ) 90 tablet 0   metFORMIN (GLUCOPHAGE-XR) 500 MG 24 hr tablet TAKE 2 TABLETS DAILY WITH BREAKFAST 180 tablet 1   nystatin cream (MYCOSTATIN) APPLY EXTERNALLY TO THE AFFECTED AREA TWICE DAILY 30 g 5  polyethylene glycol powder (GLYCOLAX/MIRALAX) 17 GM/SCOOP powder Take 17 g by mouth 2 (two) times daily as needed. (Patient not taking: Reported on 04/01/2020) 255 g 3   polyethylene glycol powder (GLYCOLAX/MIRALAX) powder Take 17 g by mouth 2 (two) times daily as needed. (Patient not taking: Reported on 04/01/2020) 500 g 1   rivaroxaban (XARELTO) 20 MG TABS tablet Take 1 tablet (20 mg total) by mouth daily with supper. 90 tablet 3   simvastatin (ZOCOR) 20 MG tablet TAKE 1 TABLET EVERY DAY AT 6:00PM 90 tablet 2   TRUE METRIX BLOOD GLUCOSE TEST test strip TEST BLOOD SUGAR ONE TIME DAILY 100 each 3   TRUEplus  Lancets 28G MISC USE TO TEST BLOOD SUGAR ONCE DAILY. 100 each 3   Current Facility-Administered Medications on File Prior to Visit  Medication Dose Route Frequency Provider Last Rate Last Admin   cyanocobalamin ((VITAMIN B-12)) injection 1,000 mcg  1,000 mcg Intramuscular Q30 days Shawnee Knapp, MD   1,000 mcg at 09/09/17 1034    ALLERGIES: Allergies  Allergen Reactions   Codeine Other (See Comments)    vertigo    FAMILY HISTORY: Family History  Problem Relation Age of Onset   Diabetes Sister    Hyperlipidemia Sister    Hypertension Sister    Diabetes Brother    Hyperlipidemia Brother    Hypertension Brother     SOCIAL HISTORY: Social History   Socioeconomic History   Marital status: Widowed    Spouse name: Not on file   Number of children: Not on file   Years of education: Not on file   Highest education level: Not on file  Occupational History   Not on file  Tobacco Use   Smoking status: Never Smoker   Smokeless tobacco: Never Used  Vaping Use   Vaping Use: Never used  Substance and Sexual Activity   Alcohol use: No   Drug use: No   Sexual activity: Not Currently  Other Topics Concern   Not on file  Social History Narrative   Not on file   Social Determinants of Health   Financial Resource Strain:    Difficulty of Paying Living Expenses: Not on file  Food Insecurity:    Worried About [place] in the Last Year: Not on file   Ran Out of Food in the Last Year: Not on file  Transportation Needs:    Lack of Transportation (Medical): Not on file   Lack of Transportation (Non-Medical): Not on file  Physical Activity:    Days of Exercise per Week: Not on file   Minutes of Exercise per Session: Not on file  Stress:    Feeling of Stress : Not on file  Social Connections:    Frequency of Communication with Friends and Family: Not on file   Frequency of Social Gatherings with Friends and Family: Not on file   Attends  Religious Services: Not on file   Active Member of Clubs or Organizations: Not on file   Attends Archivist Meetings: Not on file   Marital Status: Not on file  Intimate Partner Violence:    Fear of Current or Ex-Partner: Not on file   Emotionally Abused: Not on file   Physically Abused: Not on file   Sexually Abused: Not on file    PHYSICAL EXAM: Blood pressure (!) 171/70, pulse 74, resp. rate 18, height _0  (1.6 m), weight 190 lb (86.2 kg), SpO2 98 %. General: No acute distress.  Patient  appears well-groomed.  Head:  Normocephalic/atraumatic Eyes:  fundi examined but not visualized Neck: supple, no paraspinal tenderness, full range of motion Back: No paraspinal tenderness Heart: regular rate and rhythm Lungs: Clear to auscultation bilaterally. Vascular: No carotid bruits. Neurological Exam: Mental status: alert and oriented to person, place, and time, recent and remote memory intact, fund of knowledge intact, attention and concentration intact, speech fluent and not dysarthric, language intact. Cranial nerves: CN I: not tested CN II: pupils equal, round and reactive to light, visual fields intact CN III, IV, VI:  full range of motion, no nystagmus, right ptosis CN V: facial sensation intact CN VII: upper and lower face symmetric CN VIII: hearing intact CN IX, X: gag intact, uvula midline CN XI: sternocleidomastoid and trapezius muscles intact CN XII: tongue midline Bulk & Tone: normal, no fasciculations. Motor:  5/5 throughout  Sensation: temperature and vibration sensation intact. Deep Tendon Reflexes:  2+ throughout, toes downgoing.  Finger to nose testing:  Without dysmetria.  Heel to shin:  Without dysmetria.  Gait: Mildly wide-based.  Romberg negative.  IMPRESSION: New onset headache after 48.    PLAN: 1.  Will order MRI of brain with and without contrast to evaluate for secondary intracranial abnormality that may have been missed on CT. 2.   Defers treatment for headache at this time.   3. Further recommendations pending results.  Thank you for allowing me to take part in the care of this patient.  Metta Clines, DO  CC:  Grant Fontana, MD  Agustina Caroli, MD

## 2020-04-21 ENCOUNTER — Ambulatory Visit: Payer: Medicare HMO | Admitting: Neurology

## 2020-04-21 ENCOUNTER — Other Ambulatory Visit: Payer: Self-pay

## 2020-04-21 ENCOUNTER — Encounter: Payer: Self-pay | Admitting: Neurology

## 2020-04-21 VITALS — BP 171/70 | HR 74 | Resp 18 | Ht 63.0 in | Wt 190.0 lb

## 2020-04-21 DIAGNOSIS — H02401 Unspecified ptosis of right eyelid: Secondary | ICD-10-CM | POA: Diagnosis not present

## 2020-04-21 DIAGNOSIS — R519 Headache, unspecified: Secondary | ICD-10-CM | POA: Diagnosis not present

## 2020-04-21 NOTE — Patient Instructions (Addendum)
1.  We will check an MRI of the brain with and without contrast. Further recommendations pending results. 2.  If headaches become more frequent, you may follow up and we can start a medication (usually an antidepressant or anti-seizure medication to treat headache).  We have sent a referral to [facility] for your MRI and they will call you directly to schedule your appointment. They are located at [place]. If you need to contact them directly please call [phone].

## 2020-04-22 ENCOUNTER — Other Ambulatory Visit: Payer: Self-pay | Admitting: Cardiovascular Disease

## 2020-04-22 DIAGNOSIS — I480 Paroxysmal atrial fibrillation: Secondary | ICD-10-CM

## 2020-04-29 ENCOUNTER — Other Ambulatory Visit: Payer: Self-pay | Admitting: Emergency Medicine

## 2020-04-29 DIAGNOSIS — E119 Type 2 diabetes mellitus without complications: Secondary | ICD-10-CM

## 2020-04-29 MED ORDER — BLOOD GLUCOSE METER KIT: PACK | 0 refills | Status: DC

## 2020-04-29 MED ORDER — CLONIDINE HCL 0.1 MG PO TABS: 0.1000 mg | ORAL_TABLET | Freq: Every day | ORAL | 0 refills | Status: DC

## 2020-04-29 NOTE — Telephone Encounter (Signed)
Requested Prescriptions  Pending Prescriptions Disp Refills  . blood glucose meter kit and supplies 1 each 0    Sig: Use to test blood sugar once daily. Dx: E11.65     Endocrinology: Diabetes - Testing Supplies Passed - 04/29/2020 10:35 AM      Passed - Valid encounter within last 12 months    Recent Outpatient Visits          4 weeks ago Urinary frequency   Primary Care at [facility], [place], MD   3 months ago Type 2 diabetes mellitus without complication, without long-term current use of insulin [facility])   [facility] at [facility], Lilia Argue, MD   6 months ago Branch retinal vein occlusion of right eye with macular edema   [facility] at [facility], Lilia Argue, MD   8 months ago Microhematuria   [facility] at [facility], Lilia Argue, MD   10 months ago Type 2 diabetes mellitus without complication, without long-term current use of insulin [facility])   [facility] at [facility], Lilia Argue, MD             . cloNIDine (CATAPRES) 0.1 MG tablet 90 tablet 0    Sig: Take 1 tablet (0.1 mg total) by mouth at bedtime.     Cardiovascular:  Alpha-2 Agonists Failed - 04/29/2020 10:35 AM      Failed - Last BP in normal range    BP Readings from Last 1 Encounters:  04/21/20 (!) 171/70         Passed - Last Heart Rate in normal range    Pulse Readings from Last 1 Encounters:  04/21/20 74         Passed - Valid encounter within last 6 months    Recent Outpatient Visits          4 weeks ago Urinary frequency   Primary Care at [facility], [place], MD   3 months ago Type 2 diabetes mellitus without complication, without long-term current use of insulin [facility])   [facility] at [facility], Lilia Argue, MD   6 months ago Branch retinal vein occlusion of right eye with macular edema   [facility] at [facility], Lilia Argue, MD   8 months ago Microhematuria   [facility] at [facility], Lilia Argue, MD   10 months ago Type 2 diabetes mellitus without complication,  without long-term current use of insulin [facility])   [facility] at [facility], Lilia Argue, MD

## 2020-04-29 NOTE — Telephone Encounter (Signed)
[facility] calling for the pt to request True metrix blood glucose meter kit and supplies  cloNIDine (CATAPRES) 0.1 MG tablet   [place], [place] Phone:  [phone]  Fax:  [phone]

## 2020-05-08 ENCOUNTER — Telehealth: Payer: Self-pay | Admitting: Neurology

## 2020-05-08 NOTE — Telephone Encounter (Signed)
It looks like patient called [facility] back and she is now scheduled with them for 11/21. I will still keep an eye on it to make sure she completes her appt then. Thanks!

## 2020-05-08 NOTE — Telephone Encounter (Signed)
Telephone call back to pt, Pt wanted to know when will [facility] call her and schedule her MRI.  Advised pt, Her MRI was approved. Not sure why they haven't will call them back.     Telephone call to [facility], They have tried to contact pt on 04/22/20 and 04/29/20 vm not set up. Advised the scheduler the [place] good til 12/1. Rep states if pt calls right back she will try to schedule her for 11/21. If not she will have to see if someone cancels or have the Auth redone.   Chelsea can you keep an eye on this. If pt can not be seen on 11/21, We will have to do another Auth.

## 2020-05-08 NOTE — Telephone Encounter (Signed)
Patient called and left a message requesting a call back from a nurse. I returned her call to get more details but she does not have a voice mail and did not pick up the call.

## 2020-05-11 ENCOUNTER — Ambulatory Visit
Admission: RE | Admit: 2020-05-11 | Discharge: 2020-05-11 | Disposition: A | Discharge status: Home / Self Care | Payer: Medicare HMO | Source: Ambulatory Visit | Attending: Neurology | Admitting: Neurology

## 2020-05-11 ENCOUNTER — Other Ambulatory Visit: Payer: Self-pay

## 2020-05-11 DIAGNOSIS — H02401 Unspecified ptosis of right eyelid: Secondary | ICD-10-CM

## 2020-05-11 DIAGNOSIS — R519 Headache, unspecified: Secondary | ICD-10-CM | POA: Diagnosis not present

## 2020-05-11 MED ORDER — GADOBENATE DIMEGLUMINE 529 MG/ML IV SOLN
20.0000 mL | Freq: Once | INTRAVENOUS | Status: AC | PRN
  Administered 2020-05-11: 20 mL via INTRAVENOUS

## 2020-05-12 NOTE — Progress Notes (Signed)
Tried calling pt in regards to her MRI results. No answer. VM not set. Unable to LVM

## 2020-05-13 ENCOUNTER — Other Ambulatory Visit: Payer: Self-pay | Admitting: Emergency Medicine

## 2020-05-13 ENCOUNTER — Telehealth: Payer: Self-pay | Admitting: Emergency Medicine

## 2020-05-13 DIAGNOSIS — H35033 Hypertensive retinopathy, bilateral: Secondary | ICD-10-CM | POA: Diagnosis not present

## 2020-05-13 DIAGNOSIS — H20011 Primary iridocyclitis, right eye: Secondary | ICD-10-CM | POA: Diagnosis not present

## 2020-05-13 DIAGNOSIS — H35373 Puckering of macula, bilateral: Secondary | ICD-10-CM | POA: Diagnosis not present

## 2020-05-13 DIAGNOSIS — E119 Type 2 diabetes mellitus without complications: Secondary | ICD-10-CM

## 2020-05-13 DIAGNOSIS — H34831 Tributary (branch) retinal vein occlusion, right eye, with macular edema: Secondary | ICD-10-CM | POA: Diagnosis not present

## 2020-05-13 MED ORDER — BLOOD GLUCOSE METER KIT: PACK | 0 refills | Status: DC

## 2020-05-13 NOTE — Telephone Encounter (Signed)
Pt states [employer] did not receive the Rx for  blood glucose meter kit and supplies  Can you resend?  [place], [place] Phone:  [phone]  Fax:  [phone]

## 2020-05-13 NOTE — Addendum Note (Signed)
Addended by: Matilde Sprang on: 05/13/2020 09:51 AM   Modules accepted: Orders

## 2020-05-13 NOTE — Telephone Encounter (Signed)
Glucose meter has been printed, signed and  faxed to [facility] on 05/13/20

## 2020-06-24 DIAGNOSIS — H2513 Age-related nuclear cataract, bilateral: Secondary | ICD-10-CM | POA: Diagnosis not present

## 2020-06-24 DIAGNOSIS — Z961 Presence of intraocular lens: Secondary | ICD-10-CM | POA: Diagnosis not present

## 2020-06-24 DIAGNOSIS — H34831 Tributary (branch) retinal vein occlusion, right eye, with macular edema: Secondary | ICD-10-CM | POA: Diagnosis not present

## 2020-06-24 DIAGNOSIS — H348312 Tributary (branch) retinal vein occlusion, right eye, stable: Secondary | ICD-10-CM | POA: Diagnosis not present

## 2020-06-24 DIAGNOSIS — H3581 Retinal edema: Secondary | ICD-10-CM | POA: Diagnosis not present

## 2020-06-24 DIAGNOSIS — H30031 Focal chorioretinal inflammation, peripheral, right eye: Secondary | ICD-10-CM | POA: Diagnosis not present

## 2020-06-25 ENCOUNTER — Telehealth: Payer: Self-pay | Admitting: Cardiovascular Disease

## 2020-06-25 NOTE — Telephone Encounter (Signed)
Pt needs a refill of Xarelto sent to pt's pharmacy. Please address

## 2020-06-25 NOTE — Telephone Encounter (Signed)
Patient returning call, states she had a missed call from [place]. I did not see any notes. Advised a message was sent to our program assistant nurse and she will be back tomorrow and should receive a call then.

## 2020-06-25 NOTE — Telephone Encounter (Signed)
Pt c/o medication issue:  1. Name of Medication: rivaroxaban (XARELTO) 20 MG TABS tablet    2. How are you currently taking this medication (dosage and times per day)? As prescribed.  3. Are you having a reaction (difficulty breathing--STAT)? No.  4. What is your medication issue? Patient states that she is unable to afford this medication and wants information about a program that can help her cover the cost. She states that she currently has one pill left. Please advise.

## 2020-06-25 NOTE — Telephone Encounter (Signed)
**Note De-Identified Gavriela Cashin Obfuscation** No answer and no way to leave a message as the pts VM has not been set up yet. Will provide her with [employer] [facility] [employer] phone number [phone]) so she can call with questions concerning their Xarelto program and to request that they mail her an application so she can apply for asst through them for her Xarelto.  Also, If the pt calls back after I leave today I will ask our refill Dept to provide the pt 2 sample bottles of Xarelto 20 mg as she is almost out and cannot miss a dose.

## 2020-06-25 NOTE — Addendum Note (Signed)
Addended by: Margaret Pyle D on: 06/25/2020 02:31 PM   Modules accepted: Orders

## 2020-06-25 NOTE — Telephone Encounter (Signed)
Leaving pt 2 bottles of Xarelto 20 mg tablets at the front desk for pt to pick up, along with pt assistance information for pt to call and apply for pt assistance. Lot# [id number]  Exp: 11/23

## 2020-06-25 NOTE — Telephone Encounter (Signed)
I will send this to our prgm asst nurse Larita Fife to see if the pt may qualify for the assistance prgm.

## 2020-06-26 NOTE — Telephone Encounter (Signed)
**Note De-Identified Darek Eifler Obfuscation** [facility] is the pharmacy for [employer] [facility] which is a discount program that the pt signs up for yearly. I took care of all that was needed when the pt signed up last year so Im not sure what is needed concerning this refill that came from [place]. I recommend addressing with [facility] as that is the pts pharmacy.

## 2020-06-27 ENCOUNTER — Other Ambulatory Visit: Payer: Self-pay

## 2020-06-27 MED ORDER — RIVAROXABAN 20 MG PO TABS: 20.0000 mg | ORAL_TABLET | Freq: Every day | ORAL | 1 refills | Status: DC

## 2020-06-27 NOTE — Telephone Encounter (Signed)
Received faxed refill request from [facility] for Xarelto 20mg  rx.  Called spoke with pt to verify she in fact wanted rx to go to [facility].  In past had been receiving rx through pt assistance from [facility] and also had refill from [facility] pending.  Pt states she wants rx sent to [facility].   Pt last saw Dr Johnsie Cancel 09/13/19, last labs 06/24/20 Creat 1.0 at [facility] per care everywhere, age 80, weight 86.2kg, CrCl 60.04, based on CrCl pt is on appropriate dosage of Xarelto 20mg  QD. Will refill rx.

## 2020-06-30 ENCOUNTER — Telehealth: Payer: Self-pay | Admitting: Emergency Medicine

## 2020-06-30 DIAGNOSIS — H30031 Focal chorioretinal inflammation, peripheral, right eye: Secondary | ICD-10-CM | POA: Diagnosis not present

## 2020-06-30 NOTE — Telephone Encounter (Signed)
Medication: meclizine (ANTIVERT) tablet 25 mg   [[id number]]  Has the patient contacted their pharmacy? YES (Agent: If no, request that the patient contact the pharmacy for the refill.) (Agent: If yes, when and what did the pharmacy advise?)  Preferred Pharmacy (with phone number or street name): [place], [place] [address] Phone: [phone] Fax: [phone] Hours: Not open 24 hours    Agent: Please be advised that RX refills may take up to 3 business days. We ask that you follow-up with your pharmacy.

## 2020-06-30 NOTE — Telephone Encounter (Signed)
Requested medication not on current list Review for refill  

## 2020-07-01 ENCOUNTER — Other Ambulatory Visit: Payer: Self-pay | Admitting: *Deleted

## 2020-07-01 DIAGNOSIS — E785 Hyperlipidemia, unspecified: Secondary | ICD-10-CM

## 2020-07-01 MED ORDER — SIMVASTATIN 20 MG PO TABS: ORAL_TABLET | ORAL | 0 refills | Status: DC

## 2020-07-02 NOTE — Telephone Encounter (Signed)
Please schedule patient appt for new med that is not on her med list. Possible appt for nausea, dizziness or motion sickness. This can be a virtual appt

## 2020-07-02 NOTE — Telephone Encounter (Signed)
Pt did not VM set up not able to LVM to sch pt for virtual appt. Will try again later.

## 2020-07-02 NOTE — Telephone Encounter (Signed)
Pt called back and sch her for tomorrow 07/03/20 for a virtual appt

## 2020-07-03 ENCOUNTER — Encounter: Payer: Self-pay | Admitting: Emergency Medicine

## 2020-07-03 ENCOUNTER — Telehealth (INDEPENDENT_AMBULATORY_CARE_PROVIDER_SITE_OTHER): Payer: Medicare HMO | Admitting: Emergency Medicine

## 2020-07-03 ENCOUNTER — Other Ambulatory Visit: Payer: Self-pay

## 2020-07-03 VITALS — Ht 63.0 in | Wt 180.0 lb

## 2020-07-03 DIAGNOSIS — I482 Chronic atrial fibrillation, unspecified: Secondary | ICD-10-CM

## 2020-07-03 DIAGNOSIS — R42 Dizziness and giddiness: Secondary | ICD-10-CM | POA: Diagnosis not present

## 2020-07-03 DIAGNOSIS — E1165 Type 2 diabetes mellitus with hyperglycemia: Secondary | ICD-10-CM

## 2020-07-03 DIAGNOSIS — I10 Essential (primary) hypertension: Secondary | ICD-10-CM | POA: Diagnosis not present

## 2020-07-03 MED ORDER — MECLIZINE HCL 25 MG PO TABS: 25.0000 mg | ORAL_TABLET | Freq: Three times a day (TID) | ORAL | 3 refills | Status: DC | PRN

## 2020-07-03 NOTE — Telephone Encounter (Signed)
Noted  

## 2020-07-03 NOTE — Progress Notes (Signed)
Telemedicine Encounter- SOAP NOTE Established Patient Patient: Home  Provider: Office     This telephone encounter was conducted with the patient's (or proxy's) verbal consent via audio telecommunications: yes/no: Yes Patient was instructed to have this encounter in a suitably private space; and to only have persons present to whom they give permission to participate. In addition, patient identity was confirmed by use of name plus two identifiers (DOB and address).  I discussed the limitations, risks, security and privacy concerns of performing an evaluation and management service by telephone and the availability of in person appointments. I also discussed with the patient that there may be a patient responsible charge related to this service. The patient expressed understanding and agreed to proceed.  I spent a total of TIME; 0 MIN TO 60 MIN: 20 minutes talking with the patient or their proxy.  Chief Complaint  Patient presents with  . Dizziness    Per patient started on Sunday with nausea and motion sickness    Subjective   Catherine Joseph is a 80 y.o. female established patient. Telephone visit today complaining of episode of dizziness with nausea and motion sickness that started on Sunday on and off until Monday.  Better yesterday and today.  No other associated symptoms. Has history of the same in the past.  Meclizine helps.  Requesting refill on this medication. No other complaints or medical concerns today.  HPI   Patient Active Problem List   Diagnosis Date Noted  . Osteopenia 04/01/2016  . Atrial fibrillation ([place]) 04/22/2014  . Lung nodule 02/01/2012  . Mixed hyperlipidemia 08/03/2011  . Bradycardia 08/03/2011  . Hypertension 07/22/2011  . Diabetes mellitus, type II ([place]) 07/22/2011    Past Medical History:  Diagnosis Date  . Anemia   . Arthritis   . Bradycardia   . Cataract   . Chronic kidney disease   . Diabetes mellitus type II   . Hypertension   .  Osteoporosis   . Syncope   . Weakness     Current Outpatient Medications  Medication Sig Dispense Refill  . acetaminophen (TYLENOL) 500 MG tablet Take 1 tablet (500 mg total) by mouth every 6 (six) hours as needed. 30 tablet 0  . cloNIDine (CATAPRES) 0.1 MG tablet Take 1 tablet (0.1 mg total) by mouth at bedtime. 90 tablet 0  . flecainide (TAMBOCOR) 100 MG tablet TAKE 1/2 TABLET TWICE DAILY 90 tablet 2  . glipiZIDE (GLUCOTROL) 10 MG tablet TAKE 1 TABLET TWICE DAILY BEFORE A MEAL 180 tablet 1  . irbesartan-hydrochlorothiazide (AVALIDE) 300-12.5 MG tablet TAKE 1 TABLET EVERY DAY (Patient taking differently: Take 1 tablet by mouth daily.) 90 tablet 0  . metFORMIN (GLUCOPHAGE-XR) 500 MG 24 hr tablet TAKE 2 TABLETS DAILY WITH BREAKFAST 180 tablet 1  . rivaroxaban (XARELTO) 20 MG TABS tablet Take 1 tablet (20 mg total) by mouth daily with supper. 90 tablet 1  . simvastatin (ZOCOR) 20 MG tablet TAKE 1 TABLET EVERY DAY AT 6:00PM 90 tablet 0  . Alcohol Swabs (B-D SINGLE USE SWABS REGULAR) PADS USE TO CHECK BLOOD SUGAR DAILY  100 each 3  . Blood Glucose Calibration (SURECHEK CONTROL SOLUTION) Normal LIQD Use to check blood sugar daily. Dx E11.65 1 each 3  . blood glucose meter kit and supplies Use to test blood sugar once daily. Dx: E11.65 1 each 0  . Blood Glucose Monitoring Suppl (TRUE METRIX AIR GLUCOSE METER) DEVI 1 each by Does not apply route daily. Use to test  blood sugar once daily. Dx: E11.65 1 Device 0  . hydrochlorothiazide (HYDRODIURIL) 25 MG tablet Take 1 tablet (25 mg total) by mouth daily. (Patient not taking: Reported on 07/03/2020) 90 tablet 1  . irbesartan (AVAPRO) 300 MG tablet Take 1 tablet (300 mg total) by mouth daily. (Patient not taking: Reported on 07/03/2020) 90 tablet 1  . nystatin cream (MYCOSTATIN) APPLY EXTERNALLY TO THE AFFECTED AREA TWICE DAILY 30 g 5  . polyethylene glycol powder (GLYCOLAX/MIRALAX) 17 GM/SCOOP powder Take 17 g by mouth 2 (two) times daily as needed.  (Patient not taking: Reported on 07/03/2020) 255 g 3  . polyethylene glycol powder (GLYCOLAX/MIRALAX) powder Take 17 g by mouth 2 (two) times daily as needed. (Patient not taking: Reported on 07/03/2020) 500 g 1  . TRUE METRIX BLOOD GLUCOSE TEST test strip TEST BLOOD SUGAR ONE TIME DAILY 100 each 3  . TRUEplus Lancets 28G MISC USE TO TEST BLOOD SUGAR ONCE DAILY. 100 each 3   Current Facility-Administered Medications  Medication Dose Route Frequency Provider Last Rate Last Admin  . cyanocobalamin ((VITAMIN B-12)) injection 1,000 mcg  1,000 mcg Intramuscular Q30 days Shawnee Knapp, MD   1,000 mcg at 09/09/17 1034    Allergies  Allergen Reactions  . Codeine Other (See Comments)    vertigo    Social History   Socioeconomic History  . Marital status: Widowed    Spouse name: Not on file  . Number of children: Not on file  . Years of education: Not on file  . Highest education level: Not on file  Occupational History  . Not on file  Tobacco Use  . Smoking status: Never Smoker  . Smokeless tobacco: Never Used  Vaping Use  . Vaping Use: Never used  Substance and Sexual Activity  . Alcohol use: No  . Drug use: No  . Sexual activity: Not Currently  Other Topics Concern  . Not on file  Social History Narrative   ambudexterty   No caffeine   Two story home    Social Determinants of Health   Financial Resource Strain: Not on file  Food Insecurity: Not on file  Transportation Needs: Not on file  Physical Activity: Not on file  Stress: Not on file  Social Connections: Not on file  Intimate Partner Violence: Not on file    ROS  Objective  Alert and oriented x3 in no apparent respiratory distress. Vitals as reported by the patient: Today's Vitals   07/03/20 1649  Weight: 180 lb (81.6 kg)  Height: $Remove'5\' 3"'YLSxehY$  (1.6 m)    There are no diagnoses linked to this encounter.  Catherine Joseph was seen today for dizziness.  Diagnoses and all orders for this visit:  Dizziness -      meclizine (ANTIVERT) 25 MG tablet; Take 1 tablet (25 mg total) by mouth 3 (three) times daily as needed for dizziness.  Chronic atrial fibrillation (HCC)  Essential hypertension  Type 2 diabetes mellitus with hyperglycemia, without long-term current use of insulin (HCC)    Clinically stable.  No red flag signs or symptoms. Meclizine as needed. Advised to contact the office if no better or worse during the next several days. Needs to also schedule office visit in the next 2 to 3 months.  I discussed the assessment and treatment plan with the patient. The patient was provided an opportunity to ask questions and all were answered. The patient agreed with the plan and demonstrated an understanding of the instructions.   The patient was  advised to call back or seek an in-person evaluation if the symptoms worsen or if the condition fails to improve as anticipated.  I provided 20 minutes of non-face-to-face time during this encounter.  Horald Pollen, MD  Primary Care at [facility]

## 2020-07-06 ENCOUNTER — Other Ambulatory Visit: Payer: Self-pay | Admitting: Emergency Medicine

## 2020-07-15 DIAGNOSIS — H3581 Retinal edema: Secondary | ICD-10-CM | POA: Diagnosis not present

## 2020-07-15 DIAGNOSIS — H2513 Age-related nuclear cataract, bilateral: Secondary | ICD-10-CM | POA: Diagnosis not present

## 2020-07-15 DIAGNOSIS — H30031 Focal chorioretinal inflammation, peripheral, right eye: Secondary | ICD-10-CM | POA: Diagnosis not present

## 2020-07-15 DIAGNOSIS — H34831 Tributary (branch) retinal vein occlusion, right eye, with macular edema: Secondary | ICD-10-CM | POA: Diagnosis not present

## 2020-07-18 ENCOUNTER — Emergency Department (HOSPITAL_COMMUNITY): Payer: Medicare HMO

## 2020-07-18 ENCOUNTER — Emergency Department (HOSPITAL_COMMUNITY)
Admission: EM | Admit: 2020-07-18 | Discharge: 2020-07-18 | Disposition: A | Discharge status: Home / Self Care | Payer: Medicare HMO | Attending: Emergency Medicine | Admitting: Emergency Medicine

## 2020-07-18 ENCOUNTER — Encounter (HOSPITAL_COMMUNITY): Payer: Self-pay

## 2020-07-18 ENCOUNTER — Other Ambulatory Visit: Payer: Self-pay

## 2020-07-18 ENCOUNTER — Telehealth: Payer: Self-pay | Admitting: Emergency Medicine

## 2020-07-18 DIAGNOSIS — Z7901 Long term (current) use of anticoagulants: Secondary | ICD-10-CM | POA: Diagnosis not present

## 2020-07-18 DIAGNOSIS — K5730 Diverticulosis of large intestine without perforation or abscess without bleeding: Secondary | ICD-10-CM | POA: Diagnosis not present

## 2020-07-18 DIAGNOSIS — I129 Hypertensive chronic kidney disease with stage 1 through stage 4 chronic kidney disease, or unspecified chronic kidney disease: Secondary | ICD-10-CM | POA: Insufficient documentation

## 2020-07-18 DIAGNOSIS — N2881 Hypertrophy of kidney: Secondary | ICD-10-CM | POA: Diagnosis not present

## 2020-07-18 DIAGNOSIS — Z79899 Other long term (current) drug therapy: Secondary | ICD-10-CM | POA: Diagnosis not present

## 2020-07-18 DIAGNOSIS — E1122 Type 2 diabetes mellitus with diabetic chronic kidney disease: Secondary | ICD-10-CM | POA: Insufficient documentation

## 2020-07-18 DIAGNOSIS — N189 Chronic kidney disease, unspecified: Secondary | ICD-10-CM | POA: Diagnosis not present

## 2020-07-18 DIAGNOSIS — R109 Unspecified abdominal pain: Secondary | ICD-10-CM | POA: Diagnosis not present

## 2020-07-18 DIAGNOSIS — Z7984 Long term (current) use of oral hypoglycemic drugs: Secondary | ICD-10-CM | POA: Diagnosis not present

## 2020-07-18 DIAGNOSIS — M545 Low back pain, unspecified: Secondary | ICD-10-CM | POA: Diagnosis not present

## 2020-07-18 DIAGNOSIS — R10A0 Flank pain, unspecified side: Secondary | ICD-10-CM

## 2020-07-18 LAB — COMPREHENSIVE METABOLIC PANEL
ALT: 12 U/L (ref 0–44)
AST: 16 U/L (ref 15–41)
Albumin: 3.9 g/dL (ref 3.5–5.0)
Alkaline Phosphatase: 79 U/L (ref 38–126)
Anion gap: 9 (ref 5–15)
BUN: 18 mg/dL (ref 8–23)
CO2: 24 mmol/L (ref 22–32)
Calcium: 9.2 mg/dL (ref 8.9–10.3)
Chloride: 106 mmol/L (ref 98–111)
Creatinine, Ser: 1.16 mg/dL — ABNORMAL HIGH (ref 0.44–1.00)
GFR, Estimated: 47 mL/min — ABNORMAL LOW (ref >60)
Glucose, Bld: 202 mg/dL — ABNORMAL HIGH (ref 70–99)
Potassium: 4 mmol/L (ref 3.5–5.1)
Sodium: 139 mmol/L (ref 135–145)
Total Bilirubin: 0.6 mg/dL (ref 0.3–1.2)
Total Protein: 8 g/dL (ref 6.5–8.1)

## 2020-07-18 LAB — CBC WITH DIFFERENTIAL/PLATELET
Abs Immature Granulocytes: 0 10*3/uL (ref 0.00–0.07)
Basophils Absolute: 0 10*3/uL (ref 0.0–0.1)
Basophils Relative: 1 %
Eosinophils Absolute: 0 10*3/uL (ref 0.0–0.5)
Eosinophils Relative: 1 %
HCT: 43.4 % (ref 36.0–46.0)
Hemoglobin: 13.9 g/dL (ref 12.0–15.0)
Immature Granulocytes: 0 %
Lymphocytes Relative: 57 %
Lymphs Abs: 2.1 10*3/uL (ref 0.7–4.0)
MCH: 28.8 pg (ref 26.0–34.0)
MCHC: 32 g/dL (ref 30.0–36.0)
MCV: 90 fL (ref 80.0–100.0)
Monocytes Absolute: 0.3 10*3/uL (ref 0.1–1.0)
Monocytes Relative: 7 %
Neutro Abs: 1.2 10*3/uL — ABNORMAL LOW (ref 1.7–7.7)
Neutrophils Relative %: 34 %
Platelets: 175 10*3/uL (ref 150–400)
RBC: 4.82 MIL/uL (ref 3.87–5.11)
RDW: 13.8 % (ref 11.5–15.5)
WBC: 3.6 10*3/uL — ABNORMAL LOW (ref 4.0–10.5)
nRBC: 0 % (ref 0.0–0.2)

## 2020-07-18 LAB — URINALYSIS, ROUTINE W REFLEX MICROSCOPIC
Bilirubin Urine: NEGATIVE
Glucose, UA: 50 mg/dL — AB
Ketones, ur: NEGATIVE mg/dL
Nitrite: NEGATIVE
Protein, ur: NEGATIVE mg/dL
Specific Gravity, Urine: 1.019 (ref 1.005–1.030)
pH: 5 (ref 5.0–8.0)

## 2020-07-18 LAB — LIPASE, BLOOD: Lipase: 37 U/L (ref 11–51)

## 2020-07-18 MED ORDER — DICLOFENAC SODIUM 1 % EX GEL: 2.0000 g | Freq: Four times a day (QID) | CUTANEOUS | 0 refills | Status: DC | PRN

## 2020-07-18 MED ORDER — ONDANSETRON 4 MG PO TBDP
4.0000 mg | ORAL_TABLET | Freq: Once | ORAL | Status: AC
  Administered 2020-07-18: 4 mg via ORAL
  Filled 2020-07-18: qty 1

## 2020-07-18 MED ORDER — HYDROCODONE-ACETAMINOPHEN 5-325 MG PO TABS: 1.0000 | ORAL_TABLET | Freq: Four times a day (QID) | ORAL | 0 refills | Status: DC | PRN

## 2020-07-18 MED ORDER — HYDROCODONE-ACETAMINOPHEN 5-325 MG PO TABS
2.0000 | ORAL_TABLET | Freq: Once | ORAL | Status: AC
  Administered 2020-07-18: 2 via ORAL
  Filled 2020-07-18: qty 2

## 2020-07-18 NOTE — ED Notes (Signed)
ED Provider at bedside. 

## 2020-07-18 NOTE — ED Notes (Signed)
Patient transported to X-ray 

## 2020-07-18 NOTE — ED Triage Notes (Signed)
patient reports that she has right lower back pain x 2 months. Patient denies any injury, radiation of pain down the leg, or urinary issues.

## 2020-07-18 NOTE — Telephone Encounter (Signed)
Pt called in wanting to know if she should go to the hospital for R side pain. I let her know if she felt she should go, then yes by all means go. She then said she would let them know we sent her. I let her know this is her decision and I could schedule her an appointment for Monday. She declined and said she was going to the ED.

## 2020-07-18 NOTE — ED Provider Notes (Signed)
[facility] DEPT Provider Note   CSN: [id number] Arrival date & time: 07/18/20  1116     History Chief Complaint  Patient presents with  . Back Pain    Catherine Joseph is a 80 y.o. female who presents emergency department chief complaint of right flank pain.  She has a past medical history of CKD, diabetes, hypertension, osteopenia.  She complains of 2 months of right side and flank pain.  Nothing seems to make the pain any better.  She has pain when she lies on the right side but denies pain with any movement position twisting bending etc.  The pain is fairly severe at times she has had difficulty sleeping.  She has not had any injuries to that area.  She denies difficulty with breathing, nausea, postprandial pain, vomiting, diarrhea, urinary symptoms.  She denies any previous rashes or blisters to the right side.  She describes the pain as constant, aching and at times sharp.  HPI     Past Medical History:  Diagnosis Date  . Anemia   . Arthritis   . Bradycardia   . Cataract   . Chronic kidney disease   . Diabetes mellitus type II   . Hypertension   . Osteoporosis   . Syncope   . Weakness     Patient Active Problem List   Diagnosis Date Noted  . Osteopenia 04/01/2016  . Atrial fibrillation ([place]) 04/22/2014  . Lung nodule 02/01/2012  . Mixed hyperlipidemia 08/03/2011  . Bradycardia 08/03/2011  . Hypertension 07/22/2011  . Diabetes mellitus, type II ([place]) 07/22/2011    Past Surgical History:  Procedure Laterality Date  . ABDOMINAL HYSTERECTOMY    . CESAREAN SECTION    . COLON SURGERY    . EYE SURGERY    . FRACTURE SURGERY       OB History   No obstetric history on file.     Family History  Problem Relation Age of Onset  . Diabetes Sister   . Hyperlipidemia Sister   . Hypertension Sister   . Diabetes Brother   . Hyperlipidemia Brother   . Hypertension Brother     Social History   Tobacco Use  . Smoking status:  Never Smoker  . Smokeless tobacco: Never Used  Vaping Use  . Vaping Use: Never used  Substance Use Topics  . Alcohol use: No  . Drug use: No    Home Medications Prior to Admission medications   Medication Sig Start Date End Date Taking? Authorizing Provider  acetaminophen (TYLENOL) 500 MG tablet Take 1 tablet (500 mg total) by mouth every 6 (six) hours as needed. 11/19/16   Shawnee Knapp, MD  Alcohol Swabs (B-D SINGLE USE SWABS REGULAR) PADS USE TO CHECK BLOOD SUGAR DAILY  06/03/17   Shawnee Knapp, MD  Blood Glucose Calibration (SURECHEK CONTROL SOLUTION) Normal LIQD Use to check blood sugar daily. Dx E11.65 09/08/15   Shawnee Knapp, MD  blood glucose meter kit and supplies Use to test blood sugar once daily. Dx: E11.65 05/13/20   Horald Pollen, MD  Blood Glucose Monitoring Suppl (TRUE METRIX AIR GLUCOSE METER) DEVI 1 each by Does not apply route daily. Use to test blood sugar once daily. Dx: E11.65 09/09/15   Ivar Drape D, PA  cloNIDine (CATAPRES) 0.1 MG tablet TAKE 1 TABLET AT BEDTIME 07/06/20   Horald Pollen, MD  flecainide [facility]) 100 MG tablet TAKE 1/2 TABLET TWICE DAILY 04/22/20   Josue Hector,  MD  glipiZIDE (GLUCOTROL) 10 MG tablet TAKE 1 TABLET TWICE DAILY BEFORE A MEAL 01/10/20   Jacelyn Pi, Lilia Argue, MD  hydrochlorothiazide (HYDRODIURIL) 25 MG tablet Take 1 tablet (25 mg total) by mouth daily. Patient not taking: Reported on 07/03/2020 01/10/20   Jacelyn Pi, Lilia Argue, MD  irbesartan (AVAPRO) 300 MG tablet Take 1 tablet (300 mg total) by mouth daily. Patient not taking: Reported on 07/03/2020 01/10/20   Jacelyn Pi, Lilia Argue, MD  irbesartan-hydrochlorothiazide (AVALIDE) 300-12.5 MG tablet TAKE 1 TABLET EVERY DAY Patient taking differently: Take 1 tablet by mouth daily. 09/18/19   Jacelyn Pi, Lilia Argue, MD  meclizine (ANTIVERT) 25 MG tablet Take 1 tablet (25 mg total) by mouth 3 (three) times daily as needed for dizziness. 07/03/20   Horald Pollen, MD   metFORMIN (GLUCOPHAGE-XR) 500 MG 24 hr tablet TAKE 2 TABLETS DAILY WITH BREAKFAST 01/10/20   Jacelyn Pi, Lilia Argue, MD  nystatin cream (MYCOSTATIN) APPLY EXTERNALLY TO THE AFFECTED AREA TWICE DAILY 01/10/20   Jacelyn Pi, Lilia Argue, MD  polyethylene glycol powder [facility]) 17 GM/SCOOP powder Take 17 g by mouth 2 (two) times daily as needed. Patient not taking: Reported on 07/03/2020 05/24/19   Jacelyn Pi, Lilia Argue, MD  polyethylene glycol powder [facility]) powder Take 17 g by mouth 2 (two) times daily as needed. Patient not taking: Reported on 07/03/2020 11/19/16   Shawnee Knapp, MD  rivaroxaban (XARELTO) 20 MG TABS tablet Take 1 tablet (20 mg total) by mouth daily with supper. 06/27/20   Josue Hector, MD  simvastatin (ZOCOR) 20 MG tablet TAKE 1 TABLET EVERY DAY AT 6:00PM 07/01/20   Horald Pollen, MD  TRUE METRIX BLOOD GLUCOSE TEST test strip TEST BLOOD SUGAR ONE TIME DAILY 06/03/17   Shawnee Knapp, MD  TRUEplus Lancets 28G MISC USE TO TEST BLOOD SUGAR ONCE DAILY. 08/31/19   Daleen Squibb, MD    Allergies    Codeine  Review of Systems   Review of Systems Ten systems reviewed and are negative for acute change, except as noted in the HPI.   Physical Exam Updated Vital Signs BP (!) 163/83 (BP Location: Left Arm)   Pulse 88   Temp 98.1 F (36.7 C) (Oral)   Resp 16   Ht $R'5\' 3"'Hh$  (1.6 m)   Wt 81.6 kg   SpO2 99%   BMI 31.89 kg/m   Physical Exam Vitals and nursing note reviewed.  Constitutional:      General: She is not in acute distress.    Appearance: She is well-developed and well-nourished. She is not diaphoretic.  HENT:     Head: Normocephalic and atraumatic.  Eyes:     General: No scleral icterus.    Conjunctiva/sclera: Conjunctivae normal.  Cardiovascular:     Rate and Rhythm: Normal rate and regular rhythm.     Heart sounds: Normal heart sounds. No murmur heard. No friction rub. No gallop.   Pulmonary:     Effort: Pulmonary effort is normal. No  respiratory distress.     Breath sounds: Normal breath sounds.  Abdominal:     General: Bowel sounds are normal. There is no distension.     Palpations: Abdomen is soft. There is no mass.     Tenderness: There is no abdominal tenderness. There is left CVA tenderness. There is no right CVA tenderness or guarding.  Musculoskeletal:        General: Normal range of motion.     Cervical  back: Normal range of motion. No spasms, tenderness or bony tenderness. Normal range of motion.     Thoracic back: No spasms, tenderness or bony tenderness. Normal range of motion.     Lumbar back: No spasms, tenderness or bony tenderness.  Skin:    General: Skin is warm and dry.  Neurological:     Mental Status: She is alert and oriented to person, place, and time.  Psychiatric:        Behavior: Behavior normal.     ED Results / Procedures / Treatments   Labs (all labs ordered are listed, but only abnormal results are displayed) Labs Reviewed - No data to display  EKG None  Radiology No results found.  Procedures Procedures   Medications Ordered in ED Medications - No data to display  ED Course  I have reviewed the triage vital signs and the nursing notes.  Pertinent labs & imaging results that were available during my care of the patient were reviewed by me and considered in my medical decision making (see chart for details).    MDM Rules/Calculators/A&P                           Final Clinical Impression(s) / ED Diagnoses Final diagnoses:  Flank pain   CC:R flank pain VS:  Vitals:   07/18/20 1121 07/18/20 1124  BP: (!) 163/83   Pulse: 88   Resp: 16   Temp: 98.1 F (36.7 C)   TempSrc: Oral   SpO2: 99%   Weight:  81.6 kg  Height:  $Remove'5\' 3"'vZbgDYb$  (1.6 m)    EV:OJJKKXF is gathered by patien and emr. Previous records obtained and reviewed. DDX:The patient's complaint of flank pain involves an extensive number of diagnostic and treatment options, and is a complaint that carries with  it a high risk of complications, morbidity, and potential mortality. Given the large differential diagnosis, medical decision making is of high complexity. The differential diagnosis of emergent flank pain includes, but is not limited to :Abdominal aortic aneurysm,, Renal artery embolism,Renal vein thrombosis, Aortic dissection, Mesenteric ischemia, Pyelonephritis, Renal infarction, Renal hemorrhage, Nephrolithiasis/ Renal Colic, Bladder tumor,Cystitis, Biliary colic, Pancreatitis Perforated peptic ulcer Appendicitis ,Inguinal Hernia, Diverticulitis, Bowel obstruction PID/TOA,Ovarian cyst, Ovarian torsion, Shingles Lower lobe pneumonia, Retroperitoneal hematoma/abscess/tumor, Epidural abscess, Epidural hematoma  Labs: I ordered reviewed and interpreted labs which include CBC which shows mildly low white blood cell count of insignificant value, CMP with elevated blood glucose, slightly elevated creatinine level which is consistent with patient's baseline chronic kidney disease, UA without evidence of infection.  Lipase within normal limits. Imaging: I ordered and reviewed images which included CT Noncon of the abdomen pelvis. I independently visualized and interpreted all imaging. There are no acute, significant findings on today's images. EKG: Consults: MDM: 80 year old female here with right flank pain.  Unsure of the cause of the patient's pain however her pain was resolved with a single Norco.  I will discharge her with a very short course to use when her pain is severe this seemed to relieve her pain.  I reviewed the PDMP prior to prescribing.  The patient does not appear to have shingles, ureteral colic, renal infarction or hemorrhage.  She does have a small chronic L2 compression fracture however does not appear to be painful with movement and I have very low suspicion that this is the cause of her pain as well.  Patient is referred back to her PCP.  Discussed return  precautions.  She appears  appropriate for discharge at this time Patient disposition:The patient appears reasonably screened and/or stabilized for discharge and I doubt any other medical condition or other [facility] requiring further screening, evaluation, or treatment in the ED at this time prior to discharge. I have discussed lab and/or imaging findings with the patient and answered all questions/concerns to the best of my ability.I have discussed return precautions and OP follow up.     Rx / DC Orders ED Discharge Orders    None       Margarita Mail, PA-C 07/18/20 1900    Dorie Rank, MD 07/19/20 [phone]

## 2020-07-18 NOTE — Discharge Instructions (Addendum)
Contact a health care provider if: Your pain is not controlled with medicine. You have new symptoms. Your pain gets worse. You have a fever. Your symptoms last longer than 2-3 days. You have trouble urinating or you are urinating very frequently. Get help right away if: You have trouble breathing or you are short of breath. Your abdomen hurts or it is swollen or red. You have nausea or vomiting. You feel faint or you pass out. You have blood in your urine. 

## 2020-07-31 ENCOUNTER — Other Ambulatory Visit: Payer: Self-pay

## 2020-07-31 ENCOUNTER — Telehealth: Payer: Self-pay | Admitting: Emergency Medicine

## 2020-07-31 DIAGNOSIS — R42 Dizziness and giddiness: Secondary | ICD-10-CM

## 2020-07-31 MED ORDER — MECLIZINE HCL 25 MG PO TABS: 25.0000 mg | ORAL_TABLET | Freq: Three times a day (TID) | ORAL | 0 refills | Status: DC | PRN

## 2020-07-31 NOTE — Telephone Encounter (Signed)
Pt needs this medication sent to [facility]  meclizine (ANTIVERT) 25 MG tablet [[id number]]    [facility], [place]  Please advise.

## 2020-08-13 ENCOUNTER — Other Ambulatory Visit: Payer: Self-pay | Admitting: Emergency Medicine

## 2020-08-13 DIAGNOSIS — E119 Type 2 diabetes mellitus without complications: Secondary | ICD-10-CM

## 2020-08-19 DIAGNOSIS — D4102 Neoplasm of uncertain behavior of left kidney: Secondary | ICD-10-CM | POA: Diagnosis not present

## 2020-08-19 DIAGNOSIS — R350 Frequency of micturition: Secondary | ICD-10-CM | POA: Diagnosis not present

## 2020-08-19 DIAGNOSIS — R3915 Urgency of urination: Secondary | ICD-10-CM | POA: Diagnosis not present

## 2020-08-19 DIAGNOSIS — M545 Low back pain, unspecified: Secondary | ICD-10-CM | POA: Diagnosis not present

## 2020-08-22 ENCOUNTER — Other Ambulatory Visit: Payer: Self-pay | Admitting: Emergency Medicine

## 2020-08-22 DIAGNOSIS — R42 Dizziness and giddiness: Secondary | ICD-10-CM

## 2020-08-22 NOTE — Telephone Encounter (Signed)
Requested medication (s) are due for refill today - unsure  Requested medication (s) are on the active medication list -yes  Future visit scheduled -yes  Last refill:07/31/20  Notes to clinic: Request RF non delegated Rx  Requested Prescriptions  Pending Prescriptions Disp Refills   meclizine (ANTIVERT) 25 MG tablet [Pharmacy Med Name: [place] 25 MG Tablet] 90 tablet 0    Sig: TAKE 1 TABLET THREE TIMES DAILY AS NEEDED FOR DIZZINESS      Not Delegated - Gastroenterology: Antiemetics Failed - 08/22/2020  2:34 PM      Failed - This refill cannot be delegated      Passed - Valid encounter within last 6 months    Recent Outpatient Visits           1 month ago Dizziness   [facility] at [place], Ines Bloomer, MD   4 months ago Urinary frequency   [facility] at [place], [place], MD   7 months ago Type 2 diabetes mellitus without complication, without long-term current use of insulin [facility])   [facility] at [facility], Lilia Argue, MD   10 months ago Branch retinal vein occlusion of right eye with macular edema   Primary Care at [facility], Lilia Argue, MD   1 year ago Microhematuria   Primary Care at [facility], Lilia Argue, MD       Future Appointments             In 4 days [place], Ines Bloomer, MD Primary [facility] at [place], [facility]   In 4 weeks Josue Hector, MD [place], [facility]                 Requested Prescriptions  Pending Prescriptions Disp Refills   meclizine (ANTIVERT) 25 MG tablet [Pharmacy Med Name: [place] 25 MG Tablet] 90 tablet 0    Sig: TAKE 1 TABLET THREE TIMES DAILY AS NEEDED FOR DIZZINESS      Not Delegated - Gastroenterology: Antiemetics Failed - 08/22/2020  2:34 PM      Failed - This refill cannot be delegated      Passed - Valid encounter within last 6 months    Recent Outpatient Visits           1 month ago Dizziness   [facility] at [place], Ines Bloomer, MD   4 months ago Urinary frequency   [facility] at [place], [place], MD   7 months ago Type 2 diabetes mellitus without complication, without long-term current use of insulin [facility])   Primary Care at [facility], Lilia Argue, MD   10 months ago Branch retinal vein occlusion of right eye with macular edema   Primary Care at [facility], Lilia Argue, MD   1 year ago Microhematuria   Primary Care at [facility], Lilia Argue, MD       Future Appointments             In 4 days [place], Ines Bloomer, MD Primary Care at [place], [facility]   In 4 weeks Josue Hector, MD [place], [facility]

## 2020-08-22 NOTE — Telephone Encounter (Signed)
Please advise on med refill. She was recently seen this year.

## 2020-08-26 ENCOUNTER — Encounter: Payer: Self-pay | Admitting: Emergency Medicine

## 2020-08-26 ENCOUNTER — Other Ambulatory Visit: Payer: Self-pay

## 2020-08-26 ENCOUNTER — Ambulatory Visit (INDEPENDENT_AMBULATORY_CARE_PROVIDER_SITE_OTHER): Payer: Medicare HMO | Admitting: Emergency Medicine

## 2020-08-26 VITALS — BP 125/71 | HR 58 | Temp 97.3°F | Resp 16 | Ht 63.0 in | Wt 189.0 lb

## 2020-08-26 DIAGNOSIS — I482 Chronic atrial fibrillation, unspecified: Secondary | ICD-10-CM | POA: Diagnosis not present

## 2020-08-26 DIAGNOSIS — R109 Unspecified abdominal pain: Secondary | ICD-10-CM | POA: Diagnosis not present

## 2020-08-26 DIAGNOSIS — E1165 Type 2 diabetes mellitus with hyperglycemia: Secondary | ICD-10-CM | POA: Diagnosis not present

## 2020-08-26 DIAGNOSIS — E1159 Type 2 diabetes mellitus with other circulatory complications: Secondary | ICD-10-CM

## 2020-08-26 DIAGNOSIS — E785 Hyperlipidemia, unspecified: Secondary | ICD-10-CM | POA: Diagnosis not present

## 2020-08-26 DIAGNOSIS — I152 Hypertension secondary to endocrine disorders: Secondary | ICD-10-CM

## 2020-08-26 DIAGNOSIS — E669 Obesity, unspecified: Secondary | ICD-10-CM

## 2020-08-26 DIAGNOSIS — I10 Essential (primary) hypertension: Secondary | ICD-10-CM

## 2020-08-26 DIAGNOSIS — E1169 Type 2 diabetes mellitus with other specified complication: Secondary | ICD-10-CM

## 2020-08-26 DIAGNOSIS — Z7901 Long term (current) use of anticoagulants: Secondary | ICD-10-CM | POA: Diagnosis not present

## 2020-08-26 DIAGNOSIS — E119 Type 2 diabetes mellitus without complications: Secondary | ICD-10-CM

## 2020-08-26 DIAGNOSIS — D6859 Other primary thrombophilia: Secondary | ICD-10-CM

## 2020-08-26 LAB — POCT CBC
Granulocyte percent: 40.8 %G (ref 37–80)
HCT, POC: 44.1 % — AB (ref 29–41)
Hemoglobin: 13.8 g/dL (ref 11–14.6)
Lymph, poc: 2.6 (ref 0.6–3.4)
MCH, POC: 28.2 pg (ref 27–31.2)
MCHC: 31.4 g/dL — AB (ref 31.8–35.4)
MCV: 89.8 fL (ref 76–111)
MID (cbc): 0.3 (ref 0–0.9)
MPV: 9 fL (ref 0–99.8)
POC Granulocyte: 2 (ref 2–6.9)
POC LYMPH PERCENT: 52.5 %L — AB (ref 10–50)
POC MID %: 6.7 %M (ref 0–12)
Platelet Count, POC: 164 10*3/uL (ref 142–424)
RBC: 4.91 M/uL (ref 4.04–5.48)
RDW, POC: 14.8 %
WBC: 5 10*3/uL (ref 4.6–10.2)

## 2020-08-26 LAB — POCT GLYCOSYLATED HEMOGLOBIN (HGB A1C): Hemoglobin A1C: 6.4 % — AB (ref 4.0–5.6)

## 2020-08-26 LAB — GLUCOSE, POCT (MANUAL RESULT ENTRY): POC Glucose: 146 mg/dl — AB (ref 70–99)

## 2020-08-26 LAB — POCT URINALYSIS DIP (MANUAL ENTRY)
Bilirubin, UA: NEGATIVE
Glucose, UA: NEGATIVE mg/dL
Nitrite, UA: NEGATIVE
Protein Ur, POC: 30 mg/dL — AB
Spec Grav, UA: 1.02 (ref 1.010–1.025)
Urobilinogen, UA: 0.2 E.U./dL
pH, UA: 6.5 (ref 5.0–8.0)

## 2020-08-26 MED ORDER — NYSTATIN 100000 UNIT/GM EX CREA: TOPICAL_CREAM | CUTANEOUS | 5 refills | Status: DC

## 2020-08-26 MED ORDER — METFORMIN HCL ER 500 MG PO TB24: ORAL_TABLET | ORAL | 1 refills | Status: DC

## 2020-08-26 MED ORDER — GLIPIZIDE 10 MG PO TABS: ORAL_TABLET | ORAL | 1 refills | Status: DC

## 2020-08-26 MED ORDER — IRBESARTAN 300 MG PO TABS: 300.0000 mg | ORAL_TABLET | Freq: Every day | ORAL | 3 refills | Status: DC

## 2020-08-26 NOTE — Patient Instructions (Addendum)
  Take Tylenol every 6-8 hours as needed for flank/back pain. Avoid NSAIDs such as Aleve, Motrin, Advil, ibuprofen.   If you have lab work done today you will be contacted with your lab results within the next 2 weeks.  If you have not heard from [place] then please contact us. The fastest way to get your results is to register for My Chart.   IF you received an x-ray today, you will receive an invoice from [facility]. Please contact [facility] at [phone] with questions or concerns regarding your invoice.   IF you received labwork today, you will receive an invoice from [facility]. Please contact [facility] at [phone] with questions or concerns regarding your invoice.   Our billing staff will not be able to assist you with questions regarding bills from these companies.  You will be contacted with the lab results as soon as they are available. The fastest way to get your results is to activate your My Chart account. Instructions are located on the last page of this paperwork. If you have not heard from [place] regarding the results in 2 weeks, please contact this office.     Flank Pain, Adult Flank pain is pain in your side. The flank is the area of your side between your upper belly (abdomen) and your back. The pain may occur over a short time (acute), or it may be long-term or come back often (chronic). It may be mild or very bad. Pain in this area can be caused by many different things. Follow these instructions at home:  Drink enough fluid to keep your pee (urine) clear or pale yellow.  Rest as told by your doctor.  Take over-the-counter and prescription medicines only as told by your doctor.  Keep a journal to keep track of: ? What has caused your flank pain. ? What has made it feel better.  Keep all follow-up visits as told by your doctor. This is important.   Contact a doctor if:  Medicine does not help your pain.  You have new symptoms.  Your pain gets  worse.  You have a fever.  Your symptoms last longer than 2-3 days.  You have trouble peeing.  You are peeing more often than normal. Get help right away if:  You have trouble breathing.  You are short of breath.  Your belly hurts, or it is swollen or red.  You feel sick to your stomach (nauseous).  You throw up (vomit).  You feel like you will pass out, or you do pass out (faint).  You have blood in your pee. Summary  Flank pain is pain in your side. The flank is the area of your side between your upper belly (abdomen) and your back.  Flank pain may occur over a short time (acute), or it may be long-term or come back often (chronic). It may be mild or very bad.  Pain in this area can be caused by many different things.  Contact your doctor if your symptoms get worse or they last longer than 2-3 days. This information is not intended to replace advice given to you by your health care provider. Make sure you discuss any questions you have with your health care provider. Document Revised: 02/29/2020 Document Reviewed: 02/29/2020 Elsevier Patient Education  2021 [employer].

## 2020-08-26 NOTE — Progress Notes (Signed)
[name] 80 y.o.   Chief Complaint  Patient presents with   Flank Pain    Per patient on the right for more than a month   Back Pain    Per patient sometimes with flank pain    HISTORY OF PRESENT ILLNESS: This is a 80 y.o. female complaining of right flank pain for more than a month.  Back pain is worse in the morning and when she lays on her right side.  Pain gets better during the day with movement. States she was given hydrocodone here in the emergency room but it makes her sick.  Not taking it. Has history of hypertension, diabetes, and dyslipidemia on multiple medications. Has history of chronic A. fib on long-term anticoagulation with Xarelto. No other complaints or medical concerns today.  HPI   Prior to Admission medications   Medication Sig Start Date End Date Taking? Authorizing Provider  cloNIDine (CATAPRES) 0.1 MG tablet TAKE 1 TABLET AT BEDTIME 07/06/20  Yes Delise Simenson, Ines Bloomer, MD  flecainide (TAMBOCOR) 100 MG tablet TAKE 1/2 TABLET TWICE DAILY 04/22/20  Yes Josue Hector, MD  glipiZIDE (GLUCOTROL) 10 MG tablet TAKE 1 TABLET TWICE DAILY BEFORE A MEAL 01/10/20  Yes Jacelyn Pi, Lilia Argue, MD  HYDROcodone-acetaminophen (NORCO) 5-325 MG tablet Take 1 tablet by mouth every 6 (six) hours as needed for severe pain. 07/18/20  Yes Harris, Abigail, PA-C  irbesartan (AVAPRO) 300 MG tablet Take 1 tablet (300 mg total) by mouth daily. 01/10/20  Yes Jacelyn Pi, Lilia Argue, MD  irbesartan-hydrochlorothiazide (AVALIDE) 300-12.5 MG tablet TAKE 1 TABLET EVERY DAY Patient taking differently: Take 1 tablet by mouth daily. 09/18/19  Yes Jacelyn Pi, Lilia Argue, MD  meclizine (ANTIVERT) 25 MG tablet TAKE 1 TABLET THREE TIMES DAILY AS NEEDED FOR DIZZINESS 08/22/20  Yes Horald Pollen, MD  metFORMIN (GLUCOPHAGE-XR) 500 MG 24 hr tablet TAKE 2 TABLETS DAILY WITH BREAKFAST 01/10/20  Yes Jacelyn Pi, Irma M, MD  nystatin cream (MYCOSTATIN) APPLY EXTERNALLY TO THE AFFECTED AREA TWICE  DAILY 01/10/20  Yes Jacelyn Pi, Lilia Argue, MD  rivaroxaban (XARELTO) 20 MG TABS tablet Take 1 tablet (20 mg total) by mouth daily with supper. 06/27/20  Yes Josue Hector, MD  simvastatin (ZOCOR) 20 MG tablet TAKE 1 TABLET EVERY DAY AT 6:00PM 07/01/20  Yes Hanalei Glace, Ines Bloomer, MD  ACCU-CHEK AVIVA PLUS test strip USE TO TEST  BLOOD SUGAR ONCE DAILY 08/14/20   Horald Pollen, MD  Accu-Chek Softclix Lancets lancets USE TO TEST BLOOD SUGAR ONCE DAILY 08/14/20   Horald Pollen, MD  acetaminophen (TYLENOL) 500 MG tablet Take 1 tablet (500 mg total) by mouth every 6 (six) hours as needed. 11/19/16   Shawnee Knapp, MD  Alcohol Swabs (B-D SINGLE USE SWABS REGULAR) PADS USE TO CHECK BLOOD SUGAR DAILY  06/03/17   Shawnee Knapp, MD  Blood Glucose Calibration (SURECHEK CONTROL SOLUTION) Normal LIQD Use to check blood sugar daily. Dx E11.65 09/08/15   Shawnee Knapp, MD  blood glucose meter kit and supplies Use to test blood sugar once daily. Dx: E11.65 05/13/20   Horald Pollen, MD  Blood Glucose Monitoring Suppl (TRUE METRIX AIR GLUCOSE METER) DEVI 1 each by Does not apply route daily. Use to test blood sugar once daily. Dx: E11.65 09/09/15   Ivar Drape D, PA  diclofenac Sodium (VOLTAREN) 1 % GEL Apply 2 g topically 4 (four) times daily as needed. 07/18/20   Margarita Mail, PA-C  hydrochlorothiazide (  HYDRODIURIL) 25 MG tablet Take 1 tablet (25 mg total) by mouth daily. Patient not taking: Reported on 08/26/2020 01/10/20   Jacelyn Pi, Lilia Argue, MD  polyethylene glycol powder [facility]) 17 GM/SCOOP powder Take 17 g by mouth 2 (two) times daily as needed. Patient not taking: Reported on 08/26/2020 05/24/19   Jacelyn Pi, Lilia Argue, MD  polyethylene glycol powder [facility]) powder Take 17 g by mouth 2 (two) times daily as needed. Patient not taking: Reported on 08/26/2020 11/19/16   Shawnee Knapp, MD    Allergies  Allergen Reactions   Codeine Other (See Comments)    vertigo    Patient  Active Problem List   Diagnosis Date Noted   Osteopenia 04/01/2016   Atrial fibrillation ([place]) 04/22/2014   Lung nodule 02/01/2012   Mixed hyperlipidemia 08/03/2011   Bradycardia 08/03/2011   Hypertension 07/22/2011   Diabetes mellitus, type II ([place]) 07/22/2011    Past Medical History:  Diagnosis Date   Anemia    Arthritis    Bradycardia    Cataract    Chronic kidney disease    Diabetes mellitus type II    Hypertension    Osteoporosis    Syncope    Weakness     Past Surgical History:  Procedure Laterality Date   ABDOMINAL HYSTERECTOMY     CESAREAN SECTION     COLON SURGERY     EYE SURGERY     FRACTURE SURGERY      Social History   Socioeconomic History   Marital status: Widowed    Spouse name: Not on file   Number of children: Not on file   Years of education: Not on file   Highest education level: Not on file  Occupational History   Not on file  Tobacco Use   Smoking status: Never Smoker   Smokeless tobacco: Never Used  Vaping Use   Vaping Use: Never used  Substance and Sexual Activity   Alcohol use: No   Drug use: No   Sexual activity: Not Currently  Other Topics Concern   Not on file  Social History Narrative   ambudexterty   No caffeine   Two story home    Social Determinants of Health   Financial Resource Strain: Not on file  Food Insecurity: Not on file  Transportation Needs: Not on file  Physical Activity: Not on file  Stress: Not on file  Social Connections: Not on file  Intimate Partner Violence: Not on file    Family History  Problem Relation Age of Onset   Diabetes Sister    Hyperlipidemia Sister    Hypertension Sister    Diabetes Brother    Hyperlipidemia Brother    Hypertension Brother      Review of Systems  Constitutional: Negative.  Negative for chills and fever.  HENT: Negative.  Negative for congestion and sore throat.   Respiratory: Negative.  Negative for cough and  shortness of breath.   Cardiovascular: Negative.  Negative for chest pain and palpitations.  Gastrointestinal: Negative.  Negative for abdominal pain, diarrhea, nausea and vomiting.  Genitourinary: Positive for flank pain. Negative for dysuria, frequency and hematuria.  Musculoskeletal: Positive for back pain.  Skin: Negative.   Neurological: Negative.  Negative for dizziness and headaches.  All other systems reviewed and are negative.   Today's Vitals   08/26/20 1545  BP: 125/71  Pulse: (!) 58  Resp: 16  Temp: (!) 97.3 F (36.3 C)  TempSrc: Temporal  SpO2: 99%  Weight:  189 lb (85.7 kg)  Height: $Remove'5\' 3"'nIAjMhd$  (1.6 m)   Body mass index is 33.48 kg/m.  Physical Exam Vitals reviewed.  Constitutional:      Appearance: Normal appearance.  HENT:     Head: Normocephalic.  Eyes:     Extraocular Movements: Extraocular movements intact.     Pupils: Pupils are equal, round, and reactive to light.  Cardiovascular:     Rate and Rhythm: Normal rate. Rhythm irregular.     Pulses: Normal pulses.     Heart sounds: Normal heart sounds.  Pulmonary:     Effort: Pulmonary effort is normal.     Breath sounds: Normal breath sounds.  Abdominal:     General: There is no distension.     Palpations: Abdomen is soft.     Tenderness: There is no abdominal tenderness. There is no right CVA tenderness or left CVA tenderness.  Musculoskeletal:        General: Normal range of motion.     Cervical back: Normal range of motion and neck supple.  Skin:    General: Skin is warm and dry.     Capillary Refill: Capillary refill takes less than 2 seconds.  Neurological:     General: No focal deficit present.     Mental Status: She is alert and oriented to person, place, and time.  Psychiatric:        Mood and Affect: Mood normal.        Behavior: Behavior normal.    Results for orders placed or performed in visit on 08/26/20 (from the past 24 hour(s))  POCT glucose (manual entry)     Status: Abnormal    Collection Time: 08/26/20  4:18 PM  Result Value Ref Range   POC Glucose 146 (A) 70 - 99 mg/dl  POCT CBC     Status: Abnormal   Collection Time: 08/26/20  4:19 PM  Result Value Ref Range   WBC 5.0 4.6 - 10.2 K/uL   Lymph, poc 2.6 0.6 - 3.4   POC LYMPH PERCENT 52.5 (A) 10 - 50 %L   MID (cbc) 0.3 0 - 0.9   POC MID % 6.7 0 - 12 %M   POC Granulocyte 2.0 2 - 6.9   Granulocyte percent 40.8 37 - 80 %G   RBC 4.91 4.04 - 5.48 M/uL   Hemoglobin 13.8 11 - 14.6 g/dL   HCT, POC 44.1 (A) 29 - 41 %   MCV 89.8 76 - 111 fL   MCH, POC 28.2 27 - 31.2 pg   MCHC 31.4 (A) 31.8 - 35.4 g/dL   RDW, POC 14.8 %   Platelet Count, POC 164 142 - 424 K/uL   MPV 9.0 0 - 99.8 fL  POCT urinalysis dipstick     Status: Abnormal   Collection Time: 08/26/20  4:21 PM  Result Value Ref Range   Color, UA yellow yellow   Clarity, UA clear clear   Glucose, UA negative negative mg/dL   Bilirubin, UA negative negative   Ketones, POC UA trace (5) (A) negative mg/dL   Spec Grav, UA 1.020 1.010 - 1.025   Blood, UA moderate (A) negative   pH, UA 6.5 5.0 - 8.0   Protein Ur, POC =30 (A) negative mg/dL   Urobilinogen, UA 0.2 0.2 or 1.0 E.U./dL   Nitrite, UA Negative Negative   Leukocytes, UA Small (1+) (A) Negative  POCT glycosylated hemoglobin (Hb A1C)     Status: Abnormal   Collection Time: 08/26/20  4:23 PM  Result Value Ref Range   Hemoglobin A1C 6.4 (A) 4.0 - 5.6 %   HbA1c POC (<> result, manual entry)     HbA1c, POC (prediabetic range)     HbA1c, POC (controlled diabetic range)       ASSESSMENT & PLAN: Yashika was seen today for flank pain and back pain.  Diagnoses and all orders for this visit:  Right flank pain -     POCT glucose (manual entry) -     POCT glycosylated hemoglobin (Hb A1C) -     CMP14+EGFR -     Lipid panel -     POCT urinalysis dipstick -     POCT CBC -     Urine Culture  Chronic atrial fibrillation (HCC)  Type 2 diabetes mellitus with hyperglycemia, without long-term current  use of insulin (HCC) -     glipiZIDE (GLUCOTROL) 10 MG tablet; TAKE 1 TABLET TWICE DAILY BEFORE A MEAL -     metFORMIN (GLUCOPHAGE-XR) 500 MG 24 hr tablet; TAKE 2 TABLETS DAILY WITH BREAKFAST  Essential hypertension -     irbesartan (AVAPRO) 300 MG tablet; Take 1 tablet (300 mg total) by mouth daily.  Dyslipidemia  Current use of long term anticoagulation  Thrombophilia ([place])  Hypertension associated with diabetes ([place])  Dyslipidemia associated with type 2 diabetes mellitus ([place])  Obesity, diabetes, and hypertension syndrome ([place])  Type 2 diabetes mellitus without complication, without long-term current use of insulin (HCC) -     glipiZIDE (GLUCOTROL) 10 MG tablet; TAKE 1 TABLET TWICE DAILY BEFORE A MEAL  Other orders -     nystatin cream (MYCOSTATIN); APPLY EXTERNALLY TO THE AFFECTED AREA TWICE DAILY    Patient Instructions    Take Tylenol every 6-8 hours as needed for flank/back pain. Avoid NSAIDs such as Aleve, Motrin, Advil, ibuprofen.   If you have lab work done today you will be contacted with your lab results within the next 2 weeks.  If you have not heard from [place] then please contact us. The fastest way to get your results is to register for My Chart.   IF you received an x-ray today, you will receive an invoice from [facility]. Please contact [facility] at [phone] with questions or concerns regarding your invoice.   IF you received labwork today, you will receive an invoice from [place]. Please contact [facility] at [phone] with questions or concerns regarding your invoice.   Our billing staff will not be able to assist you with questions regarding bills from these companies.  You will be contacted with the lab results as soon as they are available. The fastest way to get your results is to activate your My Chart account. Instructions are located on the last page of this paperwork. If you have not heard from [place] regarding the results in 2  weeks, please contact this office.     Flank Pain, Adult Flank pain is pain in your side. The flank is the area of your side between your upper belly (abdomen) and your back. The pain may occur over a short time (acute), or it may be long-term or come back often (chronic). It may be mild or very bad. Pain in this area can be caused by many different things. Follow these instructions at home:  Drink enough fluid to keep your pee (urine) clear or pale yellow.  Rest as told by your doctor.  Take over-the-counter and prescription medicines only as told by your doctor.  Keep  a journal to keep track of: ? What has caused your flank pain. ? What has made it feel better.  Keep all follow-up visits as told by your doctor. This is important.   Contact a doctor if:  Medicine does not help your pain.  You have new symptoms.  Your pain gets worse.  You have a fever.  Your symptoms last longer than 2-3 days.  You have trouble peeing.  You are peeing more often than normal. Get help right away if:  You have trouble breathing.  You are short of breath.  Your belly hurts, or it is swollen or red.  You feel sick to your stomach (nauseous).  You throw up (vomit).  You feel like you will pass out, or you do pass out (faint).  You have blood in your pee. Summary  Flank pain is pain in your side. The flank is the area of your side between your upper belly (abdomen) and your back.  Flank pain may occur over a short time (acute), or it may be long-term or come back often (chronic). It may be mild or very bad.  Pain in this area can be caused by many different things.  Contact your doctor if your symptoms get worse or they last longer than 2-3 days. This information is not intended to replace advice given to you by your health care provider. Make sure you discuss any questions you have with your health care provider. Document Revised: 02/29/2020 Document Reviewed:  02/29/2020 Elsevier Patient Education  2021 Elsevier Inc.      Agustina Caroli, MD [facility]

## 2020-08-27 DIAGNOSIS — R109 Unspecified abdominal pain: Secondary | ICD-10-CM | POA: Diagnosis not present

## 2020-08-28 ENCOUNTER — Ambulatory Visit: Payer: Medicare HMO

## 2020-08-28 ENCOUNTER — Other Ambulatory Visit: Payer: Self-pay

## 2020-08-28 DIAGNOSIS — R109 Unspecified abdominal pain: Secondary | ICD-10-CM | POA: Diagnosis not present

## 2020-08-28 LAB — URINE CULTURE

## 2020-08-29 LAB — CMP14+EGFR
ALT: 9 IU/L (ref 0–32)
AST: 14 IU/L (ref 0–40)
Albumin/Globulin Ratio: 1.1 — ABNORMAL LOW (ref 1.2–2.2)
Albumin: 3.9 g/dL (ref 3.6–4.6)
Alkaline Phosphatase: 95 IU/L (ref 44–121)
BUN/Creatinine Ratio: 8 — ABNORMAL LOW (ref 12–28)
BUN: 9 mg/dL (ref 8–27)
Bilirubin Total: 0.4 mg/dL (ref 0.0–1.2)
CO2: 22 mmol/L (ref 20–29)
Calcium: 9.1 mg/dL (ref 8.7–10.3)
Chloride: 102 mmol/L (ref 96–106)
Creatinine, Ser: 1.15 mg/dL — ABNORMAL HIGH (ref 0.57–1.00)
Globulin, Total: 3.6 g/dL (ref 1.5–4.5)
Glucose: 257 mg/dL — ABNORMAL HIGH (ref 65–99)
Potassium: 4.6 mmol/L (ref 3.5–5.2)
Sodium: 142 mmol/L (ref 134–144)
Total Protein: 7.5 g/dL (ref 6.0–8.5)
eGFR: 48 mL/min/{1.73_m2} — ABNORMAL LOW (ref >59)

## 2020-08-29 LAB — LIPID PANEL
Chol/HDL Ratio: 2.4 ratio (ref 0.0–4.4)
Cholesterol, Total: 155 mg/dL (ref 100–199)
HDL: 64 mg/dL (ref >39)
LDL Chol Calc (NIH): 75 mg/dL (ref 0–99)
Triglycerides: 84 mg/dL (ref 0–149)
VLDL Cholesterol Cal: 16 mg/dL (ref 5–40)

## 2020-09-03 ENCOUNTER — Other Ambulatory Visit: Payer: Self-pay | Admitting: *Deleted

## 2020-09-03 MED ORDER — BD SWAB SINGLE USE REGULAR PADS: MEDICATED_PAD | 3 refills | Status: AC

## 2020-09-08 DIAGNOSIS — R350 Frequency of micturition: Secondary | ICD-10-CM | POA: Diagnosis not present

## 2020-09-08 DIAGNOSIS — M545 Low back pain, unspecified: Secondary | ICD-10-CM | POA: Diagnosis not present

## 2020-09-08 DIAGNOSIS — D4102 Neoplasm of uncertain behavior of left kidney: Secondary | ICD-10-CM | POA: Diagnosis not present

## 2020-09-12 NOTE — Progress Notes (Signed)
Patient ID: Catherine Joseph, female   DOB: Sep 24, 1940, 80 y.o.   MRN: [id number]      79 y.o.  [place] female history of PAF on flecainide. Has been on coumadin and now xarelto for anticoagulationHistory of atypical chest pain with normal stress echo 2013 and normal myovue 03/21/14   Lives with daughter/husband and 3 grand children ages 58-14 years Originally from [place] and lived in [place] No longer traveling to [place] Had two sisters die of cancer this year Still has nephews in [place]  No palpitations dyspnea or chest pain No bleeding issues [facility]  checks her lab work   Has some chronic vertigo and BPPV  No palpitations or bleeding issues   Seen in ED 07/18/20 with right flank/back pain negative w/u including CT abdomen and negative UA   ROS: Denies fever, malais, weight loss, blurry vision, decreased visual acuity, cough, sputum, SOB, hemoptysis, pleuritic pain, palpitaitons, heartburn, abdominal pain, melena, lower extremity edema, claudication, or rash.  All other systems reviewed and negative  General: BP 136/80   Pulse 65   Ht 5' 3" (1.6 m)   Wt 84.8 kg   SpO2 98%   BMI 33.13 kg/m  Affect appropriate Healthy:  appears stated age 68: normal Neck supple with no adenopathy JVP normal no bruits no thyromegaly Lungs clear with no wheezing and good diaphragmatic motion Heart:  S1/S2 no murmur, no rub, gallop or click PMI normal Abdomen: benighn, BS positve, no tenderness, no AAA no bruit.  No HSM or HJR Distal pulses intact with no bruits No edema Neuro non-focal Skin warm and dry No muscular weakness    Current Outpatient Medications  Medication Sig Dispense Refill  . ACCU-CHEK AVIVA PLUS test strip USE TO TEST  BLOOD SUGAR ONCE DAILY 100 strip 1  . Accu-Chek Softclix Lancets lancets USE TO TEST BLOOD SUGAR ONCE DAILY 100 each 3  . acetaminophen (TYLENOL) 500 MG tablet Take 1 tablet (500 mg total) by mouth every 6 (six) hours as  needed. 30 tablet 0  . Alcohol Swabs (B-D SINGLE USE SWABS REGULAR) PADS USE TO CHECK BLOOD SUGAR DAILY 100 each 3  . Blood Glucose Calibration (SURECHEK CONTROL SOLUTION) Normal LIQD Use to check blood sugar daily. Dx E11.65 1 each 3  . blood glucose meter kit and supplies Use to test blood sugar once daily. Dx: E11.65 1 each 0  . Blood Glucose Monitoring Suppl (TRUE METRIX AIR GLUCOSE METER) DEVI 1 each by Does not apply route daily. Use to test blood sugar once daily. Dx: E11.65 1 Device 0  . cloNIDine (CATAPRES) 0.1 MG tablet TAKE 1 TABLET AT BEDTIME 90 tablet 0  . diclofenac Sodium (VOLTAREN) 1 % GEL Apply 2 g topically 4 (four) times daily as needed. 50 g 0  . flecainide (TAMBOCOR) 100 MG tablet TAKE 1/2 TABLET TWICE DAILY 90 tablet 2  . glipiZIDE (GLUCOTROL) 10 MG tablet TAKE 1 TABLET TWICE DAILY BEFORE A MEAL 180 tablet 1  . hydrochlorothiazide (HYDRODIURIL) 25 MG tablet Take 1 tablet (25 mg total) by mouth daily. 90 tablet 1  . HYDROcodone-acetaminophen (NORCO) 5-325 MG tablet Take 1 tablet by mouth every 6 (six) hours as needed for severe pain. 20 tablet 0  . irbesartan (AVAPRO) 300 MG tablet Take 1 tablet (300 mg total) by mouth daily. 90 tablet 3  . irbesartan-hydrochlorothiazide (AVALIDE) 300-12.5 MG tablet TAKE 1 TABLET EVERY DAY (Patient taking differently: Take 1 tablet by mouth daily.) 90 tablet  0  . meclizine (ANTIVERT) 25 MG tablet TAKE 1 TABLET THREE TIMES DAILY AS NEEDED FOR DIZZINESS 90 tablet 0  . metFORMIN (GLUCOPHAGE-XR) 500 MG 24 hr tablet TAKE 2 TABLETS DAILY WITH BREAKFAST 180 tablet 1  . nystatin cream (MYCOSTATIN) APPLY EXTERNALLY TO THE AFFECTED AREA TWICE DAILY 30 g 5  . polyethylene glycol powder (GLYCOLAX/MIRALAX) 17 GM/SCOOP powder Take 17 g by mouth 2 (two) times daily as needed. 255 g 3  . polyethylene glycol powder (GLYCOLAX/MIRALAX) powder Take 17 g by mouth 2 (two) times daily as needed. 500 g 1  . rivaroxaban (XARELTO) 20 MG TABS tablet Take 1 tablet (20 mg  total) by mouth daily with supper. 90 tablet 1  . simvastatin (ZOCOR) 20 MG tablet TAKE 1 TABLET EVERY DAY AT 6:00PM 90 tablet 0   Current Facility-Administered Medications  Medication Dose Route Frequency Provider Last Rate Last Admin  . cyanocobalamin ((VITAMIN B-12)) injection 1,000 mcg  1,000 mcg Intramuscular Q30 days Shawnee Knapp, MD   1,000 mcg at 09/09/17 1034    Allergies  Codeine  Electrocardiogram:   08/02/18 SR rate 69 PAC otherwise normal  03/09/19 SR rate 69 PAC;s no acute changes 09/19/2020 SR rate 65 QT 430 msec   Assessment and Plan  PAF: maintains NSR on flecainide Anticoagulation with xarelto although there have been some issues affording long discussion with daughter about need to continue both Filled out patient assistance paperwork for xarelto  HTN:  Well controlled.  Continue current medications and low sodium Dash type diet.    Chol:  Continue statin LDL 85 05/24/19   DM:  Discussed low carb diet.  Target hemoglobin A1c is 6.5 or less.  Continue current medications. Lab Results  Component Value Date   HGBA1C 6.4 (A) 08/26/2020    F/U with me in a year   Jenkins Rouge

## 2020-09-19 ENCOUNTER — Encounter: Payer: Self-pay | Admitting: Cardiovascular Disease

## 2020-09-19 ENCOUNTER — Ambulatory Visit: Payer: Medicare HMO | Admitting: Cardiovascular Disease

## 2020-09-19 ENCOUNTER — Other Ambulatory Visit: Payer: Self-pay

## 2020-09-19 VITALS — BP 136/80 | HR 65 | Ht 63.0 in | Wt 187.0 lb

## 2020-09-19 DIAGNOSIS — I480 Paroxysmal atrial fibrillation: Secondary | ICD-10-CM | POA: Diagnosis not present

## 2020-09-19 DIAGNOSIS — I10 Essential (primary) hypertension: Secondary | ICD-10-CM

## 2020-09-19 NOTE — Patient Instructions (Addendum)

## 2020-09-29 DIAGNOSIS — S32010D Wedge compression fracture of first lumbar vertebra, subsequent encounter for fracture with routine healing: Secondary | ICD-10-CM | POA: Diagnosis not present

## 2020-09-29 DIAGNOSIS — D472 Monoclonal gammopathy: Secondary | ICD-10-CM | POA: Diagnosis not present

## 2020-09-29 DIAGNOSIS — S32010A Wedge compression fracture of first lumbar vertebra, initial encounter for closed fracture: Secondary | ICD-10-CM | POA: Diagnosis not present

## 2020-09-29 DIAGNOSIS — E119 Type 2 diabetes mellitus without complications: Secondary | ICD-10-CM | POA: Diagnosis not present

## 2020-09-29 DIAGNOSIS — R109 Unspecified abdominal pain: Secondary | ICD-10-CM | POA: Diagnosis not present

## 2020-09-29 DIAGNOSIS — I639 Cerebral infarction, unspecified: Secondary | ICD-10-CM | POA: Diagnosis not present

## 2020-09-29 DIAGNOSIS — I482 Chronic atrial fibrillation, unspecified: Secondary | ICD-10-CM | POA: Diagnosis not present

## 2020-09-29 DIAGNOSIS — I10 Essential (primary) hypertension: Secondary | ICD-10-CM | POA: Diagnosis not present

## 2020-10-08 ENCOUNTER — Other Ambulatory Visit: Payer: Self-pay | Admitting: Emergency Medicine

## 2020-10-13 ENCOUNTER — Other Ambulatory Visit: Payer: Self-pay

## 2020-10-13 MED ORDER — RIVAROXABAN 20 MG PO TABS: 20.0000 mg | ORAL_TABLET | Freq: Every day | ORAL | 1 refills | Status: DC

## 2020-10-13 NOTE — Telephone Encounter (Signed)
Patient called to ask for refills for XARELTO, states she has been out for a week and is interested in samples. Please address.

## 2020-10-13 NOTE — Telephone Encounter (Signed)
Pt's age 80, wt 84.8 kg, SCr 56.25, last ov w PN 09/19/20. Sending in refill, so pt won't need samples - she can p/u rx from pharmacy.

## 2020-10-14 DIAGNOSIS — D72819 Decreased white blood cell count, unspecified: Secondary | ICD-10-CM | POA: Diagnosis not present

## 2020-10-14 DIAGNOSIS — D72822 Plasmacytosis: Secondary | ICD-10-CM | POA: Diagnosis not present

## 2020-10-14 DIAGNOSIS — D472 Monoclonal gammopathy: Secondary | ICD-10-CM | POA: Diagnosis not present

## 2020-10-16 DIAGNOSIS — D472 Monoclonal gammopathy: Secondary | ICD-10-CM | POA: Diagnosis not present

## 2020-10-21 DIAGNOSIS — H30031 Focal chorioretinal inflammation, peripheral, right eye: Secondary | ICD-10-CM | POA: Diagnosis not present

## 2020-10-21 DIAGNOSIS — H2513 Age-related nuclear cataract, bilateral: Secondary | ICD-10-CM | POA: Diagnosis not present

## 2020-10-21 DIAGNOSIS — H3581 Retinal edema: Secondary | ICD-10-CM | POA: Diagnosis not present

## 2020-10-21 DIAGNOSIS — H34831 Tributary (branch) retinal vein occlusion, right eye, with macular edema: Secondary | ICD-10-CM | POA: Diagnosis not present

## 2020-10-23 DIAGNOSIS — D472 Monoclonal gammopathy: Secondary | ICD-10-CM | POA: Insufficient documentation

## 2020-10-23 DIAGNOSIS — H30009 Unspecified focal chorioretinal inflammation, unspecified eye: Secondary | ICD-10-CM | POA: Diagnosis not present

## 2020-10-23 DIAGNOSIS — D709 Neutropenia, unspecified: Secondary | ICD-10-CM | POA: Diagnosis not present

## 2020-10-23 DIAGNOSIS — H34839 Tributary (branch) retinal vein occlusion, unspecified eye, with macular edema: Secondary | ICD-10-CM | POA: Diagnosis not present

## 2020-12-08 DIAGNOSIS — H524 Presbyopia: Secondary | ICD-10-CM | POA: Diagnosis not present

## 2020-12-08 DIAGNOSIS — H52203 Unspecified astigmatism, bilateral: Secondary | ICD-10-CM | POA: Diagnosis not present

## 2020-12-29 ENCOUNTER — Other Ambulatory Visit: Payer: Self-pay | Admitting: Emergency Medicine

## 2020-12-29 DIAGNOSIS — E785 Hyperlipidemia, unspecified: Secondary | ICD-10-CM

## 2021-01-08 ENCOUNTER — Telehealth: Payer: Self-pay | Admitting: Cardiovascular Disease

## 2021-01-08 NOTE — Telephone Encounter (Signed)
Call placed to pt.  She ws paying $125 for 90 day supply through [facility] and it just went up to $275 and she couldn't pay that.  She is aware we will leave 2 weeks of samples and I will forward her information to [facility], LPN, to assist her on patient assistance.  Pt was very thankful for the help.

## 2021-01-08 NOTE — Telephone Encounter (Signed)
      Patient calling the office for samples of medication:   1.  What medication and dosage are you requesting samples for? rivaroxaban (XARELTO) 20 MG TABS tablet  2.  Are you currently out of this medication? Yes   Pt's requesting samples

## 2021-01-09 NOTE — Telephone Encounter (Signed)
**Note De-Identified Catherine Joseph Obfuscation** I called the pt and we discussed pt asst for her Xarelto through [employer]. She is familiar with the applying process as she has applied in the past with [employer] for Xarelto.  I gave her [employer] phone number and advised her to call them to request that they mail her an application to her home address.  She is aware that once she receives her application to complete her part, obtain required documents per [employer] if any, and to bring all to Dr Kyla Balzarine office on [facility] [place] to drop off at the front office and that we will take care of the provider page of her application and will fax all to [employer].  She thanked me for calling her back with this information.

## 2021-01-19 NOTE — Telephone Encounter (Signed)
**Note De-Identified Gamal Todisco Obfuscation** The pts daughter [name] [facility]) states that she faxed the pts Wynetta Emery and [place] pt asst application to our office.  The application it too light (low ink)  to read and the pts daughter wrote in names and money amounts of each household member which is not what [employer] is asking and this will most likely hold up the pts application process.  I advised [name] to write down how many people live in the home (Not names) with the pt and then to write down the entire household total gross earnings.  She verbalized understanding and states that she is going to email the pts application directly to my email address.

## 2021-01-22 NOTE — Telephone Encounter (Signed)
**Note De-Identified Shaolin Armas Obfuscation** The pts daughter [name] [facility]) e-mailed the pts completed [employer] Pt Asst application for Xarelto to me. I have completed the provider page and have e-mailed all to Dr Kyla Balzarine nurse so she can obtain his signature, date it, and to fax all to [employer] at the fax number written on the cover letter included or to place in the to be faxed box in Medical Records to be faxed.

## 2021-01-22 NOTE — Telephone Encounter (Signed)
Received email of fax that needs to be sent. Will have Dr. Johnsie Cancel sign tomorrow when he is in office.

## 2021-01-28 DIAGNOSIS — D472 Monoclonal gammopathy: Secondary | ICD-10-CM | POA: Diagnosis not present

## 2021-01-28 DIAGNOSIS — H3581 Retinal edema: Secondary | ICD-10-CM | POA: Diagnosis not present

## 2021-01-28 DIAGNOSIS — M4856XA Collapsed vertebra, not elsewhere classified, lumbar region, initial encounter for fracture: Secondary | ICD-10-CM | POA: Diagnosis not present

## 2021-01-28 DIAGNOSIS — D704 Cyclic neutropenia: Secondary | ICD-10-CM | POA: Diagnosis not present

## 2021-01-28 DIAGNOSIS — Z79899 Other long term (current) drug therapy: Secondary | ICD-10-CM | POA: Diagnosis not present

## 2021-01-30 ENCOUNTER — Other Ambulatory Visit: Payer: Self-pay | Admitting: Emergency Medicine

## 2021-01-30 ENCOUNTER — Other Ambulatory Visit: Payer: Self-pay | Admitting: Cardiovascular Disease

## 2021-01-30 DIAGNOSIS — E119 Type 2 diabetes mellitus without complications: Secondary | ICD-10-CM

## 2021-01-30 DIAGNOSIS — E1165 Type 2 diabetes mellitus with hyperglycemia: Secondary | ICD-10-CM

## 2021-01-30 DIAGNOSIS — I480 Paroxysmal atrial fibrillation: Secondary | ICD-10-CM

## 2021-02-12 ENCOUNTER — Other Ambulatory Visit: Payer: Self-pay | Admitting: Emergency Medicine

## 2021-03-04 DIAGNOSIS — I10 Essential (primary) hypertension: Secondary | ICD-10-CM | POA: Diagnosis not present

## 2021-03-04 DIAGNOSIS — N2889 Other specified disorders of kidney and ureter: Secondary | ICD-10-CM | POA: Diagnosis not present

## 2021-03-04 DIAGNOSIS — E1165 Type 2 diabetes mellitus with hyperglycemia: Secondary | ICD-10-CM | POA: Diagnosis not present

## 2021-03-18 DIAGNOSIS — Z23 Encounter for immunization: Secondary | ICD-10-CM | POA: Diagnosis not present

## 2021-03-18 DIAGNOSIS — N2889 Other specified disorders of kidney and ureter: Secondary | ICD-10-CM | POA: Diagnosis not present

## 2021-03-18 DIAGNOSIS — E2839 Other primary ovarian failure: Secondary | ICD-10-CM | POA: Diagnosis not present

## 2021-03-18 DIAGNOSIS — I10 Essential (primary) hypertension: Secondary | ICD-10-CM | POA: Diagnosis not present

## 2021-03-18 DIAGNOSIS — E1165 Type 2 diabetes mellitus with hyperglycemia: Secondary | ICD-10-CM | POA: Diagnosis not present

## 2021-03-18 DIAGNOSIS — M545 Low back pain, unspecified: Secondary | ICD-10-CM | POA: Diagnosis not present

## 2021-03-18 DIAGNOSIS — R001 Bradycardia, unspecified: Secondary | ICD-10-CM | POA: Diagnosis not present

## 2021-03-20 ENCOUNTER — Telehealth: Payer: Self-pay | Admitting: Cardiovascular Disease

## 2021-03-20 NOTE — Telephone Encounter (Signed)
Left message for patient to call back  

## 2021-03-20 NOTE — Telephone Encounter (Signed)
Dawn with Dr Lindell Noe office like in and stated that pt Hr has been running in the low 50's STAT if HR is under 50 or over 120 (normal HR is 60-100 beats per minute)  What is your heart rate? Pt Hr has been running in the low 50's since they seen her on the 15th of Sept   Do you have a log of your heart rate readings (document readings)? NA   Do you have any other symptoms? She stated she does not have any other symptoms right now  Pt was not with her when she called in but they are trying to see if pt can be seen as soon as possible   [phone] Dawns number /  office currently closed and will be back on Monday

## 2021-03-24 DIAGNOSIS — H3581 Retinal edema: Secondary | ICD-10-CM | POA: Diagnosis not present

## 2021-03-24 DIAGNOSIS — H30031 Focal chorioretinal inflammation, peripheral, right eye: Secondary | ICD-10-CM | POA: Diagnosis not present

## 2021-03-24 DIAGNOSIS — H34831 Tributary (branch) retinal vein occlusion, right eye, with macular edema: Secondary | ICD-10-CM | POA: Diagnosis not present

## 2021-03-24 DIAGNOSIS — H2513 Age-related nuclear cataract, bilateral: Secondary | ICD-10-CM | POA: Diagnosis not present

## 2021-03-25 ENCOUNTER — Other Ambulatory Visit: Payer: Self-pay | Admitting: Family Medicine

## 2021-03-25 DIAGNOSIS — E2839 Other primary ovarian failure: Secondary | ICD-10-CM

## 2021-04-14 ENCOUNTER — Ambulatory Visit: Payer: Medicare HMO | Admitting: Orthopaedic Surgery

## 2021-04-14 ENCOUNTER — Other Ambulatory Visit: Payer: Self-pay

## 2021-04-14 ENCOUNTER — Ambulatory Visit: Payer: Self-pay

## 2021-04-14 ENCOUNTER — Encounter: Payer: Self-pay | Admitting: Orthopaedic Surgery

## 2021-04-14 ENCOUNTER — Other Ambulatory Visit: Payer: Self-pay | Admitting: Emergency Medicine

## 2021-04-14 VITALS — BP 142/80 | HR 88 | Ht 63.0 in | Wt 185.2 lb

## 2021-04-14 DIAGNOSIS — E1165 Type 2 diabetes mellitus with hyperglycemia: Secondary | ICD-10-CM

## 2021-04-14 DIAGNOSIS — M545 Low back pain, unspecified: Secondary | ICD-10-CM | POA: Diagnosis not present

## 2021-04-14 NOTE — Progress Notes (Signed)
Office Visit Note   Patient: Catherine Joseph           Date of Birth: [birth year]           MRN: [id number] Visit Date: 04/14/2021              Requested by: Glenis Smoker, MD [place],  [place] [address] PCP: Glenis Smoker, MD   Assessment & Plan: Visit Diagnoses:  1. Low back pain, unspecified back pain laterality, unspecified chronicity, unspecified whether sciatica present     Plan: X-ray shows some mild chronic compression of L1 anteriorly without history of trauma no lytic or sclerotic lesions.  We will set her up for some physical therapy recheck her in 5 weeks if she is having persistent problems then diagnostic MRI scan would be indicated.  We will set therapy up [facility] outpatient physical therapy department which is convenient for her.  Follow-Up Instructions: No follow-ups on file.   Orders:  Orders Placed This Encounter  Procedures   XR Lumbar Spine 2-3 Views   No orders of the defined types were placed in this encounter.     Procedures: No procedures performed   Clinical Data: No additional findings.   Subjective: Chief Complaint  Patient presents with   Lower Back - New Patient (Initial Visit)    HPI 80 year old female referred by other Dr. Laverna Peace for problems with significant right upper lumbar pain that radiates around above the iliac crest.  States the pain is about 8 out of 10 started about 5 months ago.  No history of falls or injuries no numbness or tingling in her legs.  She has been able to walk on heels and toes she is taken Tylenol for the pain which has not helped she states has become constant.  No past history of car accidents.  Sometimes she gets up at night to urinate 2 or 3 times.  No dysuria or urinary frequency.  She does have type 2 diabetes but feels that it is under good control.  No chills or fever no associated bowel or bladder symptoms symptoms.  Review of Systems past history  of lung nodules hyperlipidemia hypertension and atrial fibs.   Objective: Vital Signs: BP (!) 142/80 (BP Location: Left Arm, Patient Position: Sitting, Cuff Size: Normal)   Pulse 88   Ht 5\' 3"  (1.6 m)   Wt 185 lb 3.2 oz (84 kg)   SpO2 97%   BMI 32.81 kg/m   Physical Exam Constitutional:      Appearance: She is well-developed.  HENT:     Head: Normocephalic.     Right Ear: External ear normal.     Left Ear: External ear normal. There is no impacted cerumen.  Eyes:     Pupils: Pupils are equal, round, and reactive to light.  Neck:     Thyroid: No thyromegaly.     Trachea: No tracheal deviation.  Cardiovascular:     Rate and Rhythm: Normal rate.  Pulmonary:     Effort: Pulmonary effort is normal.  Abdominal:     Palpations: Abdomen is soft.  Musculoskeletal:     Cervical back: No rigidity.  Skin:    General: Skin is warm and dry.  Neurological:     Mental Status: She is alert and oriented to person, place, and time.  Psychiatric:        Behavior: Behavior normal.    Ortho Exam patient has negative logroll hips negative  straight leg raising 90 degrees stable heel and toe walk.  Tenderness over the upper lumbar region that radiates around on the right side above the iliac crest.  No flank tenderness.  No sciatic notch tenderness greater trochanters are normal.  Knees reach full extension she has mild crepitus of both knees.  Specialty Comments:  No specialty comments available.  Imaging: XR Lumbar Spine 2-3 Views  Result Date: 04/14/2021 AP lateral lumbar spine images are obtained and reviewed.  This shows chronic loss of anterior height L1 of 30%.  No spondylolisthesis is noted no evidence of retropulsion. Compression: Normal lumbar spine radiographs other than evidence of chronic compression L1.    PMFS History: Patient Active Problem List   Diagnosis Date Noted   Osteopenia 04/01/2016   Atrial fibrillation ([place]) 04/22/2014   Lung nodule 02/01/2012   Mixed  hyperlipidemia 08/03/2011   Bradycardia 08/03/2011   Hypertension 07/22/2011   Type 2 diabetes mellitus with hyperglycemia, without long-term current use of insulin ([place]) 07/22/2011   Past Medical History:  Diagnosis Date   Anemia    Arthritis    Bradycardia    Cataract    Chronic kidney disease    Diabetes mellitus type II    Hypertension    Osteoporosis    Syncope    Weakness     Family History  Problem Relation Age of Onset   Diabetes Sister    Hyperlipidemia Sister    Hypertension Sister    Diabetes Brother    Hyperlipidemia Brother    Hypertension Brother     Past Surgical History:  Procedure Laterality Date   ABDOMINAL HYSTERECTOMY     CESAREAN SECTION     COLON SURGERY     EYE SURGERY     FRACTURE SURGERY     Social History   Occupational History   Not on file  Tobacco Use   Smoking status: Never   Smokeless tobacco: Never  Vaping Use   Vaping Use: Never used  Substance and Sexual Activity   Alcohol use: No   Drug use: No   Sexual activity: Not Currently

## 2021-04-15 NOTE — Telephone Encounter (Signed)
Left second message for patient to call back.

## 2021-04-17 ENCOUNTER — Telehealth: Payer: Self-pay | Admitting: Orthopaedic Surgery

## 2021-04-17 ENCOUNTER — Other Ambulatory Visit: Payer: Self-pay

## 2021-04-17 DIAGNOSIS — M545 Low back pain, unspecified: Secondary | ICD-10-CM

## 2021-04-17 NOTE — Telephone Encounter (Signed)
Pt called requesting a call back concerning a referral for physical therapy. Pt seems confused on what she need . Please call pt at [phone].

## 2021-04-17 NOTE — Telephone Encounter (Signed)
I spoke with patient. I have placed order for AP PT per last OV note for her.

## 2021-05-07 ENCOUNTER — Ambulatory Visit (HOSPITAL_COMMUNITY): Payer: Medicare HMO | Attending: Orthopaedic Surgery | Admitting: Physical Therapy

## 2021-05-07 ENCOUNTER — Other Ambulatory Visit: Payer: Self-pay

## 2021-05-07 ENCOUNTER — Encounter (HOSPITAL_COMMUNITY): Payer: Self-pay | Admitting: Physical Therapy

## 2021-05-07 DIAGNOSIS — M545 Low back pain, unspecified: Secondary | ICD-10-CM | POA: Diagnosis not present

## 2021-05-07 DIAGNOSIS — R2689 Other abnormalities of gait and mobility: Secondary | ICD-10-CM | POA: Insufficient documentation

## 2021-05-07 NOTE — Patient Instructions (Signed)
Access Code: [email or web address] URL: [email or web address] Date: 05/07/2021 Prepared by: Josue Hector  Exercises Supine Single Knee to Chest Stretch - 2-3 x daily - 7 x weekly - 1 sets - 10 reps - 5 second hold Supine Double Knee to Chest - 2-3 x daily - 7 x weekly - 1 sets - 10 reps - 5 second hold Supine Lower Trunk Rotation - 2-3 x daily - 7 x weekly - 1 sets - 10 reps - 5 second hold

## 2021-05-07 NOTE — Therapy (Signed)
[place], [place], [address] Phone: [phone]   Fax:  [phone]  Physical Therapy Evaluation  Patient Details  Name: Catherine Joseph MRN: [id number] Date of Birth: [birth year] Referring Provider (PT): Rodell Perna MD   Encounter Date: 05/07/2021   PT End of Session - 05/07/21 1519     Visit Number 1    Number of Visits 8    Date for PT Re-Evaluation 06/04/21    Authorization Type [employer] medicare    Authorization Time Period check auth    PT Start Time 0240    PT Stop Time 0320    PT Time Calculation (min) 40 min    Activity Tolerance Patient tolerated treatment well    Behavior During Therapy [facility] for tasks assessed/performed             Past Medical History:  Diagnosis Date   Anemia    Arthritis    Bradycardia    Cataract    Chronic kidney disease    Diabetes mellitus type II    Hypertension    Osteoporosis    Syncope    Weakness     Past Surgical History:  Procedure Laterality Date   ABDOMINAL HYSTERECTOMY     CESAREAN SECTION     COLON SURGERY     EYE SURGERY     FRACTURE SURGERY      There were no vitals filed for this visit.    Subjective Assessment - 05/07/21 1451     Subjective Patient presents to therapy with complaint of RT side back pain. She reports this is chronic. She notes difficulty sitting up. She denies taking medication. She has not had prior therapy.    Limitations Sitting;Lifting;Standing;Walking;House hold activities    Diagnostic tests xrays (show "chronic compression at L1)    Patient Stated Goals to feel better    Currently in Pain? Yes    Pain Score 6     Pain Location Back    Pain Orientation Right;Posterior;Lateral    Pain Descriptors / Indicators Sharp    Pain Type Chronic pain    Pain Onset More than a month ago    Pain Frequency Constant    Aggravating Factors  sitting up, prolonged sitting    Pain Relieving Factors nothing    Effect of Pain  on Daily Activities Limit                [facility] PT Assessment - 05/07/21 0001       Assessment   Medical Diagnosis LBP    Referring Provider (PT) Rodell Perna MD    Onset Date/Surgical Date --   chronic   Prior Therapy No      Precautions   Precautions None      Restrictions   Weight Bearing Restrictions No      Balance Screen   Has the patient fallen in the past 6 months No      [place] residence    Living Arrangements Children      Prior Function   Level of Independence Independent      Cognition   Overall Cognitive Status Within Functional Limits for tasks assessed      Observation/Other Assessments   Focus on Therapeutic Outcomes (FOTO)  65% function      ROM / Strength   AROM / PROM / Strength AROM;Strength      AROM   AROM Assessment Site Lumbar  Lumbar Flexion WFL    Lumbar Extension 50% limited    Lumbar - Right Side Bend WFL    Lumbar - Left Side [facility]      Strength   Strength Assessment Site Hip;Knee;Ankle    Right/Left Hip Right;Left    Right Hip Flexion 4/5    Right Hip Extension 3+/5    Right Hip ABduction 4/5    Left Hip Flexion 4/5    Left Hip Extension 3+/5    Left Hip ABduction 4/5    Right/Left Knee Right;Left    Right Knee Extension 4+/5    Left Knee Extension 5/5    Right/Left Ankle Right;Left    Right Ankle Dorsiflexion 5/5    Left Ankle Dorsiflexion 5/5      Palpation   Spinal mobility Pain recreated with P/A pressure at T12/L1-2                        Objective measurements completed on examination: See above findings.       [facility] Adult PT Treatment/Exercise - 05/07/21 0001       Exercises   Exercises Lumbar      Lumbar Exercises: Stretches   Single Knee to Chest Stretch 5 reps;10 seconds    Double Knee to Chest Stretch 5 reps;10 seconds    Lower Trunk Rotation 5 reps;10 seconds                     PT Education - 05/07/21 1455     Education  Details on evalution findings, POC and HEP    Person(s) Educated Patient    Methods Explanation;Handout    Comprehension Verbalized understanding              PT Short Term Goals - 05/07/21 1538       PT SHORT TERM GOAL #1   Title Patient will be independent with initial HEP and self-management strategies to improve functional outcomes    Time 2    Period Weeks    Status New    Target Date 05/21/21               PT Long Term Goals - 05/07/21 1538       PT LONG TERM GOAL #1   Title Patient will be independent with advance HEP and self-management strategies to improve functional outcomes    Time 4    Period Weeks    Status New    Target Date 06/04/21      PT LONG TERM GOAL #2   Title Patient will improve FOTO score to predicted value to indicate improvement in functional outcomes    Time 4    Period Weeks    Status New    Target Date 06/04/21      PT LONG TERM GOAL #3   Title Patient will have equal to or > 4+/5 MMT throughout BLE to improve ability to perform functional mobility, stair ambulation and ADLs.    Time 4    Period Weeks    Status New    Target Date 06/04/21      PT LONG TERM GOAL #4   Title Patient will report at least 70% overall improvement in subjective complaint to indicate improvement in ability to perform ADLs.    Time 4    Period Weeks    Status New    Target Date 06/04/21  Plan - 05/07/21 1520     Clinical Impression Statement Patient is an 80 y.o. female who presents to physical therapy with complaint of LBP. Patient demonstrates decreased strength, ROM restriction, increased tenderness to palpation and spinal mobility restrictions which are likely contributing to symptoms of pain and are negatively impacting patient ability to perform ADLs and functional mobility tasks. Patient will benefit from skilled physical therapy services to address these deficits to reduce pain, improve level of function with ADLs  and functional mobility tasks.    Personal Factors and Comorbidities Age    Examination-Activity Limitations Lift;Stand;Locomotion Level;Transfers;Reach Overhead;Sit;Stairs    Examination-Participation Restrictions Art gallery manager;Yard Work;Laundry;Shop    Stability/Clinical Decision Making Stable/Uncomplicated    Clinical Decision Making Low    Rehab Potential Good    PT Frequency 2x / week    PT Duration 4 weeks    PT Treatment/Interventions ADLs/Self Care Home Management;Biofeedback;Cryotherapy;Ultrasound;Parrafin;Fluidtherapy;Therapeutic activities;Patient/family education;Functional mobility training;Manual techniques;Neuromuscular re-education;Balance training;Orthotic Fit/Training;Compression bandaging;Scar mobilization;Passive range of motion;Manual lymph drainage;Energy conservation;Dry needling;Splinting;Spinal Manipulations;Visual/perceptual remediation/compensation;Vestibular;Vasopneumatic Device;Taping;Joint Manipulations;Therapeutic exercise;Contrast Bath;DME Instruction;Electrical Stimulation;Iontophoresis 4mg /ml Dexamethasone;Gait training;Stair training;Moist Heat;Traction    PT Next Visit Plan Assess response to HEP. Progress core stabilization and glute strength as tolerated. ab brace, ab march, glute set/ bridge, SLR. Possibly lumbar decompression exercise series.    PT Home Exercise Plan Eval: [facility], [place], LTR    Consulted and Agree with Plan of Care Patient             Patient will benefit from skilled therapeutic intervention in order to improve the following deficits and impairments:  Pain, Improper body mechanics, Increased fascial restricitons, Decreased mobility, Decreased activity tolerance, Decreased range of motion, Decreased strength, Hypomobility, Impaired flexibility  Visit Diagnosis: Low back pain, unspecified back pain laterality, unspecified chronicity, unspecified whether sciatica present     Problem List Patient Active Problem List    Diagnosis Date Noted   Osteopenia 04/01/2016   Atrial fibrillation ([place]) 04/22/2014   Lung nodule 02/01/2012   Mixed hyperlipidemia 08/03/2011   Bradycardia 08/03/2011   Hypertension 07/22/2011   Type 2 diabetes mellitus with hyperglycemia, without long-term current use of insulin ([place]) 07/22/2011   3:44 PM, 05/07/21 Josue Hector PT DPT  Physical Therapist with [facility]  [phone] [place], [place], [address] Phone: [phone]   Fax:  [phone]  Name: SAGE HAMMILL MRN: [id number] Date of Birth: 04/19/1941

## 2021-05-11 ENCOUNTER — Other Ambulatory Visit: Payer: Self-pay

## 2021-05-11 ENCOUNTER — Other Ambulatory Visit: Payer: Self-pay | Admitting: Emergency Medicine

## 2021-05-11 ENCOUNTER — Encounter (HOSPITAL_COMMUNITY): Payer: Self-pay | Admitting: Physical Therapy

## 2021-05-11 ENCOUNTER — Ambulatory Visit (HOSPITAL_COMMUNITY): Payer: Medicare HMO | Admitting: Physical Therapy

## 2021-05-11 DIAGNOSIS — M545 Low back pain, unspecified: Secondary | ICD-10-CM

## 2021-05-11 DIAGNOSIS — R2689 Other abnormalities of gait and mobility: Secondary | ICD-10-CM | POA: Diagnosis not present

## 2021-05-11 MED ORDER — RIVAROXABAN 20 MG PO TABS: 20.0000 mg | ORAL_TABLET | Freq: Every day | ORAL | 6 refills | Status: DC

## 2021-05-11 NOTE — Therapy (Signed)
[place], [place], [address] Phone: [phone]   Fax:  [phone]  Physical Therapy Treatment  Patient Details  Name: Catherine Joseph MRN: [id number] Date of Birth: 1941/05/08 Referring Provider (PT): Rodell Perna MD   Encounter Date: 05/11/2021   PT End of Session - 05/11/21 1449     Visit Number 2    Number of Visits 8    Date for PT Re-Evaluation 06/04/21    Authorization Type [employer] medicare    Authorization Time Period 8 visits approved 05/07/21- 06/04/21    Authorization - Visit Number 2    Authorization - Number of Visits 8    PT Start Time 1450    PT Stop Time 1528    PT Time Calculation (min) 38 min    Activity Tolerance Patient tolerated treatment well    Behavior During Therapy WFL for tasks assessed/performed             Past Medical History:  Diagnosis Date   Anemia    Arthritis    Bradycardia    Cataract    Chronic kidney disease    Diabetes mellitus type II    Hypertension    Osteoporosis    Syncope    Weakness     Past Surgical History:  Procedure Laterality Date   ABDOMINAL HYSTERECTOMY     CESAREAN SECTION     COLON SURGERY     EYE SURGERY     FRACTURE SURGERY      There were no vitals filed for this visit.   Subjective Assessment - 05/11/21 1450     Subjective Back is still bothering her. Has been doing her home exercises.    Limitations Sitting;Lifting;Standing;Walking;House hold activities    Diagnostic tests xrays (show "chronic compression at L1)    Patient Stated Goals to feel better    Currently in Pain? Yes    Pain Score 8     Pain Location Back    Pain Orientation Right    Pain Descriptors / Indicators Sharp    Pain Type Chronic pain    Pain Onset More than a month ago                               [facility] Adult PT Treatment/Exercise - 05/11/21 0001       Lumbar Exercises: Stretches   Single Knee to Chest Stretch 5 reps;10  seconds    Double Knee to Chest Stretch 5 reps;10 seconds    Lower Trunk Rotation 5 reps;10 seconds      Lumbar Exercises: Supine   Ab Set 10 reps    Glut Set 5 reps;5 seconds    Bent Knee Raise 10 reps    Bent Knee Raise Limitations 2 sets    Bridge 10 reps    Bridge Limitations 2 sets    Straight Leg Raise 10 reps    Straight Leg Raises Limitations 2 sets bilateral                     PT Education - 05/11/21 1450     Education Details HEP    Person(s) Educated Patient    Methods Explanation    Comprehension Verbalized understanding              PT Short Term Goals - 05/07/21 1538       PT SHORT TERM GOAL #1  Title Patient will be independent with initial HEP and self-management strategies to improve functional outcomes    Time 2    Period Weeks    Status New    Target Date 05/21/21               PT Long Term Goals - 05/07/21 1538       PT LONG TERM GOAL #1   Title Patient will be independent with advance HEP and self-management strategies to improve functional outcomes    Time 4    Period Weeks    Status New    Target Date 06/04/21      PT LONG TERM GOAL #2   Title Patient will improve FOTO score to predicted value to indicate improvement in functional outcomes    Time 4    Period Weeks    Status New    Target Date 06/04/21      PT LONG TERM GOAL #3   Title Patient will have equal to or > 4+/5 MMT throughout BLE to improve ability to perform functional mobility, stair ambulation and ADLs.    Time 4    Period Weeks    Status New    Target Date 06/04/21      PT LONG TERM GOAL #4   Title Patient will report at least 70% overall improvement in subjective complaint to indicate improvement in ability to perform ADLs.    Time 4    Period Weeks    Status New    Target Date 06/04/21                   Plan - 05/11/21 1450     Clinical Impression Statement Began session with previously completed stretches which patient  completes with fair mechanics with intermittent cueing. Patient requires frequent cueing for hold times of exercises despite frequent education with poor carry over. Patient with difficulty with TRA activation with multimodal cueing provided but some improvement with reps. Patient will continue to benefit from skilled physical therapy in order to reduce impairment and improve function.    Personal Factors and Comorbidities Age    Examination-Activity Limitations Lift;Stand;Locomotion Level;Transfers;Reach Overhead;Sit;Stairs    Examination-Participation Restrictions Art gallery manager;Yard Work;Laundry;Shop    Stability/Clinical Decision Making Stable/Uncomplicated    Rehab Potential Good    PT Frequency 2x / week    PT Duration 4 weeks    PT Treatment/Interventions ADLs/Self Care Home Management;Biofeedback;Cryotherapy;Ultrasound;Parrafin;Fluidtherapy;Therapeutic activities;Patient/family education;Functional mobility training;Manual techniques;Neuromuscular re-education;Balance training;Orthotic Fit/Training;Compression bandaging;Scar mobilization;Passive range of motion;Manual lymph drainage;Energy conservation;Dry needling;Splinting;Spinal Manipulations;Visual/perceptual remediation/compensation;Vestibular;Vasopneumatic Device;Taping;Joint Manipulations;Therapeutic exercise;Contrast Bath;DME Instruction;Electrical Stimulation;Iontophoresis 4mg /ml Dexamethasone;Gait training;Stair training;Moist Heat;Traction    PT Next Visit Plan Assess response to HEP. Progress core stabilization and glute strength as tolerated. Possibly lumbar decompression exercise series.    PT Home Exercise Plan Eval: [place], [place], [place] 11/21 bridge, march    Consulted and Agree with Plan of Care Patient             Patient will benefit from skilled therapeutic intervention in order to improve the following deficits and impairments:  Pain, Improper body mechanics, Increased fascial restricitons, Decreased mobility,  Decreased activity tolerance, Decreased range of motion, Decreased strength, Hypomobility, Impaired flexibility  Visit Diagnosis: Low back pain, unspecified back pain laterality, unspecified chronicity, unspecified whether sciatica present     Problem List Patient Active Problem List   Diagnosis Date Noted   Osteopenia 04/01/2016   Atrial fibrillation ([place]) 04/22/2014   Lung nodule 02/01/2012   Mixed hyperlipidemia 08/03/2011  Bradycardia 08/03/2011   Hypertension 07/22/2011   Type 2 diabetes mellitus with hyperglycemia, without long-term current use of insulin ([place]) 07/22/2011    3:28 PM, 05/11/21 Mearl Latin PT, DPT Physical Therapist at [place], [place], [address] Phone: [phone]   Fax:  [phone]  Name: Catherine Joseph MRN: [id number] Date of Birth: April 14, 1941

## 2021-05-11 NOTE — Telephone Encounter (Signed)
Xarelto 20 mg refill request received. Pt is 80 years old, weight- 84 kg, Crea- 1.11on 01/28/21, last seen by Dr. Mingo Amber on 09/22/20, Diagnosis-PAF, CrCl- 51.82; Dose is appropriate based on dosing criteria. Will send in refill to requested pharmacy.    [facility] pharmacy calling to let us know that they are having an issue fulfulling rx and ask for a short term rx be sent to pt's local pharmacy, [facility].

## 2021-05-11 NOTE — Patient Instructions (Signed)
Access Code: [email or web address] URL: [email or web address] Date: 05/11/2021 Prepared by: [facility] Hung Rhinesmith  Exercises Supine March - 1 x daily - 7 x weekly - 2 sets - 10 reps Supine Bridge - 1 x daily - 7 x weekly - 2 sets - 10 reps

## 2021-05-13 ENCOUNTER — Ambulatory Visit (HOSPITAL_COMMUNITY): Payer: Medicare HMO

## 2021-05-19 ENCOUNTER — Other Ambulatory Visit: Payer: Self-pay

## 2021-05-19 ENCOUNTER — Ambulatory Visit: Payer: Medicare HMO | Admitting: Orthopaedic Surgery

## 2021-05-19 ENCOUNTER — Ambulatory Visit (HOSPITAL_COMMUNITY): Payer: Medicare HMO | Admitting: Physical Therapy

## 2021-05-19 DIAGNOSIS — M545 Low back pain, unspecified: Secondary | ICD-10-CM | POA: Diagnosis not present

## 2021-05-19 DIAGNOSIS — R2689 Other abnormalities of gait and mobility: Secondary | ICD-10-CM | POA: Diagnosis not present

## 2021-05-19 NOTE — Therapy (Signed)
[place], [place], [address] Phone: [phone]   Fax:  [phone]  Physical Therapy Treatment  Patient Details  Name: Catherine Joseph MRN: [id number] Date of Birth: Nov 16, 1940 Referring Provider (PT): Rodell Perna MD   Encounter Date: 05/19/2021   PT End of Session - 05/19/21 1320     Visit Number 3    Number of Visits 8    Date for PT Re-Evaluation 06/04/21    Authorization Type [employer] medicare    Authorization Time Period 8 visits approved 05/07/21- 06/04/21    Authorization - Visit Number 3    Authorization - Number of Visits 8    PT Start Time 1320    PT Stop Time 1358    PT Time Calculation (min) 38 min    Activity Tolerance Patient tolerated treatment well    Behavior During Therapy WFL for tasks assessed/performed             Past Medical History:  Diagnosis Date   Anemia    Arthritis    Bradycardia    Cataract    Chronic kidney disease    Diabetes mellitus type II    Hypertension    Osteoporosis    Syncope    Weakness     Past Surgical History:  Procedure Laterality Date   ABDOMINAL HYSTERECTOMY     CESAREAN SECTION     COLON SURGERY     EYE SURGERY     FRACTURE SURGERY      There were no vitals filed for this visit.   Subjective Assessment - 05/19/21 1321     Subjective Her back is still sore in the same spot. She has been doing HEP    Currently in Pain? Yes    Pain Score 6     Pain Location Back    Pain Descriptors / Indicators Sharp    Pain Type Chronic pain    Pain Onset More than a month ago    Pain Frequency Constant                               [facility] Adult PT Treatment/Exercise - 05/19/21 0001       Lumbar Exercises: Stretches   Single Knee to Chest Stretch 5 reps;10 seconds    Double Knee to Chest Stretch 5 reps    Double Knee to Chest Stretch Limitations 5 second holds with green ball, 2 sets    Other Lumbar Stretch Exercise  overhead reach/R QL stretch in seated 10 x 10 second holds    Other Lumbar Stretch Exercise t/sp ext over chair 10 x 5 second holds, lumbar flexion in seated with green ball - 3 directions 10 x 5 second holds      Manual Therapy   Manual Therapy Soft tissue mobilization    Manual therapy comments completed independent from all other aspects of treatment    Soft tissue mobilization self STM with tennis ball to thoracic and lumbar paraspinals                     PT Education - 05/19/21 1321     Education Details HEP    Person(s) Educated Patient    Methods Explanation;Demonstration    Comprehension Verbalized understanding;Returned demonstration              PT Short Term Goals - 05/07/21 1538  PT SHORT TERM GOAL #1   Title Patient will be independent with initial HEP and self-management strategies to improve functional outcomes    Time 2    Period Weeks    Status New    Target Date 05/21/21               PT Long Term Goals - 05/07/21 1538       PT LONG TERM GOAL #1   Title Patient will be independent with advance HEP and self-management strategies to improve functional outcomes    Time 4    Period Weeks    Status New    Target Date 06/04/21      PT LONG TERM GOAL #2   Title Patient will improve FOTO score to predicted value to indicate improvement in functional outcomes    Time 4    Period Weeks    Status New    Target Date 06/04/21      PT LONG TERM GOAL #3   Title Patient will have equal to or > 4+/5 MMT throughout BLE to improve ability to perform functional mobility, stair ambulation and ADLs.    Time 4    Period Weeks    Status New    Target Date 06/04/21      PT LONG TERM GOAL #4   Title Patient will report at least 70% overall improvement in subjective complaint to indicate improvement in ability to perform ADLs.    Time 4    Period Weeks    Status New    Target Date 06/04/21                   Plan - 05/19/21 1320      Clinical Impression Statement Patient continues to require cueing for mechanics and positioning of previously completed exercises. Patient notes concordant symptoms with overhead reach exercise for QL/paraspinal mobility. Patient with tenderness and hyperactive musculature in R lower thoracic paraspinal region. Educated on self STM with ball and she is able to perform with good technique. Patient states decrease in symptoms to 4/10 at end of session. Patient states decrease in symptoms following. Patient will continue to benefit from skilled physical therapy in order to reduce impairment and improve function.    Personal Factors and Comorbidities Age    Examination-Activity Limitations Lift;Stand;Locomotion Level;Transfers;Reach Overhead;Sit;Stairs    Examination-Participation Restrictions Art gallery manager;Yard Work;Laundry;Shop    Stability/Clinical Decision Making Stable/Uncomplicated    Rehab Potential Good    PT Frequency 2x / week    PT Duration 4 weeks    PT Treatment/Interventions ADLs/Self Care Home Management;Biofeedback;Cryotherapy;Ultrasound;Parrafin;Fluidtherapy;Therapeutic activities;Patient/family education;Functional mobility training;Manual techniques;Neuromuscular re-education;Balance training;Orthotic Fit/Training;Compression bandaging;Scar mobilization;Passive range of motion;Manual lymph drainage;Energy conservation;Dry needling;Splinting;Spinal Manipulations;Visual/perceptual remediation/compensation;Vestibular;Vasopneumatic Device;Taping;Joint Manipulations;Therapeutic exercise;Contrast Bath;DME Instruction;Electrical Stimulation;Iontophoresis 4mg /ml Dexamethasone;Gait training;Stair training;Moist Heat;Traction    PT Next Visit Plan Assess response to HEP. Progress core stabilization and glute strength as tolerated. Possibly lumbar decompression exercise series.    PT Home Exercise Plan Eval: [place], [place], [place] 11/21 bridge, march 11/29 self STM with tennis ball - gave  her ball, lumbar flexion stretch in seated    Consulted and Agree with Plan of Care Patient             Patient will benefit from skilled therapeutic intervention in order to improve the following deficits and impairments:  Pain, Improper body mechanics, Increased fascial restricitons, Decreased mobility, Decreased activity tolerance, Decreased range of motion, Decreased strength, Hypomobility, Impaired flexibility  Visit Diagnosis: Low back pain, unspecified back pain  laterality, unspecified chronicity, unspecified whether sciatica present     Problem List Patient Active Problem List   Diagnosis Date Noted   Osteopenia 04/01/2016   Atrial fibrillation ([place]) 04/22/2014   Lung nodule 02/01/2012   Mixed hyperlipidemia 08/03/2011   Bradycardia 08/03/2011   Hypertension 07/22/2011   Type 2 diabetes mellitus with hyperglycemia, without long-term current use of insulin ([place]) 07/22/2011    2:02 PM, 05/19/21 Mearl Latin PT, DPT Physical Therapist at [facility] [place], [place], [address] Phone: [phone]   Fax:  [phone]  Name: Catherine Joseph MRN: [id number] Date of Birth: 01-11-1941

## 2021-05-19 NOTE — Patient Instructions (Addendum)
Access Code: [place] URL: [email or web address] Date: 05/19/2021 Prepared by: Mitzi Hansen Vianney Kopecky  Exercises Standing massage with ball at wall on low back - 1 x daily - 7 x weekly Seated Lumbar Flexion Stretch - 1 x daily - 7 x weekly - 10 reps

## 2021-05-21 ENCOUNTER — Encounter (HOSPITAL_COMMUNITY): Payer: Medicare HMO

## 2021-05-27 ENCOUNTER — Ambulatory Visit (HOSPITAL_COMMUNITY): Payer: Medicare HMO | Attending: Orthopaedic Surgery | Admitting: Physical Therapy

## 2021-05-27 ENCOUNTER — Other Ambulatory Visit: Payer: Self-pay

## 2021-05-27 DIAGNOSIS — M545 Low back pain, unspecified: Secondary | ICD-10-CM | POA: Insufficient documentation

## 2021-05-27 DIAGNOSIS — M6281 Muscle weakness (generalized): Secondary | ICD-10-CM | POA: Diagnosis not present

## 2021-05-27 NOTE — Therapy (Signed)
[facility], [place], [address] Phone: [phone]   Fax:  [phone]  Physical Therapy Treatment  Patient Details  Name: Catherine Joseph MRN: [id number] Date of Birth: 04-15-1941 Referring Provider (PT): Rodell Perna MD   Encounter Date: 05/27/2021   PT End of Session - 05/27/21 1035     Visit Number 4    Number of Visits 8    Date for PT Re-Evaluation 06/04/21    Authorization Type [employer] medicare    Authorization Time Period 8 visits approved 05/07/21- 06/04/21    Authorization - Visit Number 4    Authorization - Number of Visits 8    PT Start Time 1033    PT Stop Time 1113    PT Time Calculation (min) 40 min    Activity Tolerance Patient tolerated treatment well    Behavior During Therapy WFL for tasks assessed/performed             Past Medical History:  Diagnosis Date   Anemia    Arthritis    Bradycardia    Cataract    Chronic kidney disease    Diabetes mellitus type II    Hypertension    Osteoporosis    Syncope    Weakness     Past Surgical History:  Procedure Laterality Date   ABDOMINAL HYSTERECTOMY     CESAREAN SECTION     COLON SURGERY     EYE SURGERY     FRACTURE SURGERY      There were no vitals filed for this visit.   Subjective Assessment - 05/27/21 1035     Subjective "The pain is still there"    Currently in Pain? Yes    Pain Score 7     Pain Location Back    Pain Orientation Right;Mid    Pain Descriptors / Indicators Sharp    Pain Type Chronic pain    Pain Onset More than a month ago    Pain Frequency Constant                               [facility] Adult PT Treatment/Exercise - 05/27/21 0001       Lumbar Exercises: Stretches   Double Knee to Chest Stretch Other (comment)   10 x 5" holds   Lower Trunk Rotation 5 reps;10 seconds      Lumbar Exercises: Supine   Ab Set 10 reps    AB Set Limitations 5" hold    Bent Knee Raise 20 reps     Bridge 10 reps    Bridge Limitations 10" holds    Straight Leg Raise 20 reps    Straight Leg Raises Limitations 2 x 10 with ab brace      Manual Therapy   Manual Therapy Soft tissue mobilization    Manual therapy comments completed independent from all other aspects of treatment    Soft tissue mobilization IASTM using theragun (Lv 8) to RT lumbar area, patient in sidelying                       PT Short Term Goals - 05/07/21 1538       PT SHORT TERM GOAL #1   Title Patient will be independent with initial HEP and self-management strategies to improve functional outcomes    Time 2    Period Weeks    Status New  Target Date 05/21/21               PT Long Term Goals - 05/07/21 1538       PT LONG TERM GOAL #1   Title Patient will be independent with advance HEP and self-management strategies to improve functional outcomes    Time 4    Period Weeks    Status New    Target Date 06/04/21      PT LONG TERM GOAL #2   Title Patient will improve FOTO score to predicted value to indicate improvement in functional outcomes    Time 4    Period Weeks    Status New    Target Date 06/04/21      PT LONG TERM GOAL #3   Title Patient will have equal to or > 4+/5 MMT throughout BLE to improve ability to perform functional mobility, stair ambulation and ADLs.    Time 4    Period Weeks    Status New    Target Date 06/04/21      PT LONG TERM GOAL #4   Title Patient will report at least 70% overall improvement in subjective complaint to indicate improvement in ability to perform ADLs.    Time 4    Period Weeks    Status New    Target Date 06/04/21                   Plan - 05/27/21 1100     Clinical Impression Statement Patient with good tolerance overall. Does require verbal cues for hold times during stretching for maximal benefit. Trialed manual IASTM using percussion gun to address pain and restriction about RT lower lumbar. Good response, notes  abolished pain following. Patient will continue to benefit from skilled therapy services to progress core strengthening for reduce back pain and improved functional ability.    Personal Factors and Comorbidities Age    Examination-Activity Limitations Lift;Stand;Locomotion Level;Transfers;Reach Overhead;Sit;Stairs    Examination-Participation Restrictions Art gallery manager;Yard Work;Laundry;Shop    Stability/Clinical Decision Making Stable/Uncomplicated    Rehab Potential Good    PT Frequency 2x / week    PT Duration 4 weeks    PT Treatment/Interventions ADLs/Self Care Home Management;Biofeedback;Cryotherapy;Ultrasound;Parrafin;Fluidtherapy;Therapeutic activities;Patient/family education;Functional mobility training;Manual techniques;Neuromuscular re-education;Balance training;Orthotic Fit/Training;Compression bandaging;Scar mobilization;Passive range of motion;Manual lymph drainage;Energy conservation;Dry needling;Splinting;Spinal Manipulations;Visual/perceptual remediation/compensation;Vestibular;Vasopneumatic Device;Taping;Joint Manipulations;Therapeutic exercise;Contrast Bath;DME Instruction;Electrical Stimulation;Iontophoresis 4mg /ml Dexamethasone;Gait training;Stair training;Moist Heat;Traction    PT Next Visit Plan Assess response to manual. Progress core stabilization and glute strength as tolerated. Possibly lumbar decompression exercise series.    PT Home Exercise Plan Eval: [place], [place], [place] 11/21 bridge, march 11/29 self STM with tennis ball - gave her ball, lumbar flexion stretch in seated    Consulted and Agree with Plan of Care Patient             Patient will benefit from skilled therapeutic intervention in order to improve the following deficits and impairments:  Pain, Improper body mechanics, Increased fascial restricitons, Decreased mobility, Decreased activity tolerance, Decreased range of motion, Decreased strength, Hypomobility, Impaired flexibility  Visit  Diagnosis: Low back pain, unspecified back pain laterality, unspecified chronicity, unspecified whether sciatica present     Problem List Patient Active Problem List   Diagnosis Date Noted   Osteopenia 04/01/2016   Atrial fibrillation ([place]) 04/22/2014   Lung nodule 02/01/2012   Mixed hyperlipidemia 08/03/2011   Bradycardia 08/03/2011   Hypertension 07/22/2011   Type 2 diabetes mellitus with hyperglycemia, without long-term current use of insulin ([place]) 07/22/2011  11:12 AM, 05/27/21 Josue Hector PT DPT  Physical Therapist with [facility]  [phone] [place] [address] [place], [place], [address] Phone: [phone]   Fax:  [phone]  Name: Catherine Joseph MRN: [id number] Date of Birth: 11/11/1940

## 2021-05-29 ENCOUNTER — Telehealth (HOSPITAL_COMMUNITY): Payer: Self-pay

## 2021-05-29 ENCOUNTER — Ambulatory Visit (HOSPITAL_COMMUNITY): Payer: Medicare HMO

## 2021-05-29 ENCOUNTER — Ambulatory Visit: Payer: Medicare HMO | Admitting: Orthopaedic Surgery

## 2021-05-29 NOTE — Telephone Encounter (Signed)
No show, called and left message concerning missed apt today.  Reminded next apt date and time with contact number included in message if needs to cancel/reschedule apts in the future.    Ihor Austin, LPTA/CLT; Delana Meyer [phone]

## 2021-06-01 ENCOUNTER — Ambulatory Visit (HOSPITAL_COMMUNITY): Payer: Medicare HMO | Admitting: Physical Therapy

## 2021-06-03 ENCOUNTER — Telehealth (HOSPITAL_COMMUNITY): Payer: Self-pay | Admitting: Physical Therapy

## 2021-06-03 NOTE — Telephone Encounter (Signed)
She wants to ss the MD first and then she will call us back to make more appointments if she needs them

## 2021-06-04 ENCOUNTER — Ambulatory Visit (HOSPITAL_COMMUNITY): Payer: Medicare HMO | Admitting: Physical Therapy

## 2021-06-09 ENCOUNTER — Other Ambulatory Visit: Payer: Self-pay

## 2021-06-09 ENCOUNTER — Ambulatory Visit: Payer: Medicare HMO | Admitting: Orthopaedic Surgery

## 2021-06-09 ENCOUNTER — Encounter: Payer: Self-pay | Admitting: Orthopaedic Surgery

## 2021-06-09 VITALS — BP 189/76 | Ht 63.0 in | Wt 185.0 lb

## 2021-06-09 DIAGNOSIS — M545 Low back pain, unspecified: Secondary | ICD-10-CM | POA: Diagnosis not present

## 2021-06-10 NOTE — Progress Notes (Signed)
Office Visit Note   Patient: Catherine Joseph           Date of Birth: 23-Oct-1940           MRN: [id number] Visit Date: 06/09/2021              Requested by: Glenis Smoker, MD [place],  [place] [address] PCP: Glenis Smoker, MD   Assessment & Plan: Visit Diagnoses:  1. Low back pain, unspecified back pain laterality, unspecified chronicity, unspecified whether sciatica present     Plan: Patient said progressive back pain history of L1 compression fracture chronic.  She has had progressive weakness is on chronic Xarelto with atrial fibs.  We will proceed with MRI scan to rule out epidural bleed or compression with her stenosis symptoms and I will follow-up after scan.  Follow-Up Instructions: No follow-ups on file.   Orders:  Orders Placed This Encounter  Procedures   MR Lumbar Spine w/o contrast   No orders of the defined types were placed in this encounter.     Procedures: No procedures performed   Clinical Data: No additional findings.   Subjective: Chief Complaint  Patient presents with   Lower Back - Follow-up, Fracture    Chronic L1 compression fracture    HPI 80 year old female returns with ongoing problems with back pain much more severe the last 5 weeks with claudication symptoms.  She has to sit after standing for short period time.  She leans over a grocery cart or on her elbows on the table to get some relief.  Better when she sitting.  No recent falls.  History of L1 compression fracture dates back to 2018.  Patient's had increased leg weakness problems walking.  Abdominal CT done 07/18/2020 showed L1 compression unchanged from 2018 and stable.  No new compression fractures.  Patient is gone through physical therapy greater than a month without improvement.  Review of Systems all  systems updated unchanged from 04/14/2021.   Objective: Vital Signs: BP (!) 189/76 (BP Location: Left Arm, Patient Position: Sitting)     Ht 5\' 3"  (1.6 m)    Wt 185 lb (83.9 kg)    BMI 32.77 kg/m   Physical Exam Constitutional:      Appearance: She is well-developed.  HENT:     Head: Normocephalic.     Right Ear: External ear normal.     Left Ear: External ear normal. There is no impacted cerumen.  Eyes:     Pupils: Pupils are equal, round, and reactive to light.  Neck:     Thyroid: No thyromegaly.     Trachea: No tracheal deviation.  Cardiovascular:     Rate and Rhythm: Normal rate.  Pulmonary:     Effort: Pulmonary effort is normal.  Abdominal:     Palpations: Abdomen is soft.  Musculoskeletal:     Cervical back: No rigidity.  Skin:    General: Skin is warm and dry.  Neurological:     Mental Status: She is alert and oriented to person, place, and time.  Psychiatric:        Behavior: Behavior normal.    Ortho Exam anterior tib gastrocsoleus is intact negative logroll the hips.  She has some sciatic notch tenderness right and left.  Knees reach full extension.  No quad weakness right or left.  Specialty Comments:  No specialty comments available.  Imaging: No results found.   PMFS History: Patient Active Problem List   Diagnosis  Date Noted   Osteopenia 04/01/2016   Atrial fibrillation ([place]) 04/22/2014   Lung nodule 02/01/2012   Mixed hyperlipidemia 08/03/2011   Bradycardia 08/03/2011   Hypertension 07/22/2011   Type 2 diabetes mellitus with hyperglycemia, without long-term current use of insulin ([place]) 07/22/2011   Past Medical History:  Diagnosis Date   Anemia    Arthritis    Bradycardia    Cataract    Chronic kidney disease    Diabetes mellitus type II    Hypertension    Osteoporosis    Syncope    Weakness     Family History  Problem Relation Age of Onset   Diabetes Sister    Hyperlipidemia Sister    Hypertension Sister    Diabetes Brother    Hyperlipidemia Brother    Hypertension Brother     Past Surgical History:  Procedure Laterality Date   ABDOMINAL HYSTERECTOMY      CESAREAN SECTION     COLON SURGERY     EYE SURGERY     FRACTURE SURGERY     Social History   Occupational History   Not on file  Tobacco Use   Smoking status: Never   Smokeless tobacco: Never  Vaping Use   Vaping Use: Never used  Substance and Sexual Activity   Alcohol use: No   Drug use: No   Sexual activity: Not Currently

## 2021-06-23 ENCOUNTER — Telehealth: Payer: Self-pay | Admitting: Orthopaedic Surgery

## 2021-06-23 NOTE — Telephone Encounter (Signed)
Called pt but could not leave a message, calling to sch pt for MRI Lsp review with Dr. Lorin Mercy after 07/04/21

## 2021-06-24 ENCOUNTER — Other Ambulatory Visit: Payer: Self-pay | Admitting: Emergency Medicine

## 2021-06-24 DIAGNOSIS — I10 Essential (primary) hypertension: Secondary | ICD-10-CM

## 2021-06-25 ENCOUNTER — Encounter (HOSPITAL_COMMUNITY): Payer: Self-pay | Admitting: Physical Therapy

## 2021-06-25 NOTE — Therapy (Signed)
[facility], [place], [address] Phone: [phone]   Fax:  [phone]  Patient Details  Name: Catherine Joseph MRN: [id number] Date of Birth: May 16, 1941 Referring Provider:  No ref. provider found  Encounter Date: 06/25/2021  PHYSICAL THERAPY DISCHARGE SUMMARY  Visits from Start of Care: 4  Current functional level related to goals / functional outcomes: NA   Remaining deficits: NA   Education / Equipment: Patient request self DC    Patient agrees to discharge. Patient goals were not met. Patient is being discharged due to the patient's request.  8:54 AM, 06/25/21 Josue Hector PT DPT  Physical Therapist with [facility]  [phone] [place], [place], [address] Phone: [phone]   Fax:  [phone]

## 2021-06-27 ENCOUNTER — Other Ambulatory Visit: Payer: Self-pay | Admitting: Emergency Medicine

## 2021-06-27 DIAGNOSIS — E785 Hyperlipidemia, unspecified: Secondary | ICD-10-CM

## 2021-07-04 ENCOUNTER — Other Ambulatory Visit: Payer: Medicare HMO

## 2021-07-16 ENCOUNTER — Other Ambulatory Visit: Payer: Self-pay | Admitting: Emergency Medicine

## 2021-07-16 DIAGNOSIS — E119 Type 2 diabetes mellitus without complications: Secondary | ICD-10-CM

## 2021-07-22 ENCOUNTER — Ambulatory Visit
Admission: RE | Admit: 2021-07-22 | Discharge: 2021-07-22 | Disposition: A | Discharge status: Home / Self Care | Payer: Medicare HMO | Source: Ambulatory Visit | Attending: Orthopaedic Surgery | Admitting: Orthopaedic Surgery

## 2021-07-22 ENCOUNTER — Other Ambulatory Visit: Payer: Self-pay

## 2021-07-22 DIAGNOSIS — M545 Low back pain, unspecified: Secondary | ICD-10-CM | POA: Diagnosis not present

## 2021-07-22 DIAGNOSIS — M48061 Spinal stenosis, lumbar region without neurogenic claudication: Secondary | ICD-10-CM | POA: Diagnosis not present

## 2021-08-14 ENCOUNTER — Other Ambulatory Visit: Payer: Self-pay | Admitting: Emergency Medicine

## 2021-08-14 DIAGNOSIS — E119 Type 2 diabetes mellitus without complications: Secondary | ICD-10-CM

## 2021-09-14 DIAGNOSIS — N2889 Other specified disorders of kidney and ureter: Secondary | ICD-10-CM | POA: Diagnosis not present

## 2021-09-14 DIAGNOSIS — E1165 Type 2 diabetes mellitus with hyperglycemia: Secondary | ICD-10-CM | POA: Diagnosis not present

## 2021-09-14 DIAGNOSIS — I10 Essential (primary) hypertension: Secondary | ICD-10-CM | POA: Diagnosis not present

## 2021-10-15 ENCOUNTER — Other Ambulatory Visit: Payer: Self-pay | Admitting: Emergency Medicine

## 2021-10-16 DIAGNOSIS — I10 Essential (primary) hypertension: Secondary | ICD-10-CM | POA: Diagnosis not present

## 2021-10-16 DIAGNOSIS — E1165 Type 2 diabetes mellitus with hyperglycemia: Secondary | ICD-10-CM | POA: Diagnosis not present

## 2021-10-18 ENCOUNTER — Other Ambulatory Visit: Payer: Self-pay | Admitting: Emergency Medicine

## 2021-11-03 DIAGNOSIS — D49512 Neoplasm of unspecified behavior of left kidney: Secondary | ICD-10-CM | POA: Diagnosis not present

## 2021-11-13 ENCOUNTER — Ambulatory Visit
Admission: EM | Admit: 2021-11-13 | Discharge: 2021-11-13 | Disposition: A | Discharge status: Home / Self Care | Payer: Medicare HMO | Attending: Nurse Practitioner | Admitting: Nurse Practitioner

## 2021-11-13 ENCOUNTER — Ambulatory Visit (HOSPITAL_COMMUNITY)
Admission: RE | Admit: 2021-11-13 | Discharge: 2021-11-13 | Disposition: A | Discharge status: Home / Self Care | Payer: Medicare HMO | Source: Ambulatory Visit | Attending: Nurse Practitioner | Admitting: Nurse Practitioner

## 2021-11-13 ENCOUNTER — Encounter: Payer: Self-pay | Admitting: Nurse Practitioner

## 2021-11-13 DIAGNOSIS — R051 Acute cough: Secondary | ICD-10-CM | POA: Diagnosis not present

## 2021-11-13 DIAGNOSIS — R052 Subacute cough: Secondary | ICD-10-CM | POA: Insufficient documentation

## 2021-11-13 DIAGNOSIS — R059 Cough, unspecified: Secondary | ICD-10-CM | POA: Diagnosis not present

## 2021-11-13 DIAGNOSIS — J069 Acute upper respiratory infection, unspecified: Secondary | ICD-10-CM

## 2021-11-13 MED ORDER — DOXYCYCLINE HYCLATE 100 MG PO CAPS: 100.0000 mg | ORAL_CAPSULE | Freq: Two times a day (BID) | ORAL | 0 refills | Status: DC

## 2021-11-13 MED ORDER — FLUTICASONE PROPIONATE 50 MCG/ACT NA SUSP: 2.0000 | Freq: Every day | NASAL | 0 refills | Status: DC

## 2021-11-13 NOTE — Discharge Instructions (Addendum)
Take medication as prescribed. A chest x-ray has been ordered.  You will need to go to any [facility] to have the chest x-ray performed.  When the results are received, I will give you a call. Increase fluids and allow for plenty of rest. Recommend using a humidifier during sleep to help with cough. Sleep elevated on 2 pillows while symptoms persist. May continue Tylenol as needed for pain, fever, or general discomfort. Please follow-up with your primary care within the next 7 to 10 days for follow-up and reevaluation.

## 2021-11-13 NOTE — ED Provider Notes (Signed)
[facility]    CSN: [id number] Arrival date & time: 11/13/21  1327      History   Chief Complaint Chief Complaint  Patient presents with   Cough    HPI Catherine Joseph is a 81 y.o. female.    Cough Patient presents with cough that has been present for the past 2 weeks.  She states cough started out productive, but is now changed to a dry cough.  States cough is the same throughout the day.  Patient also has nasal congestion and runny nose.  She denies fever, chills, shortness of breath, difficulty breathing, or GI symptoms.  Patient does have a history of high blood pressure, type 2 diabetes, and high cholesterol.  Patient states she has been taking Tylenol and drinking tea for her symptoms.  Past Medical History:  Diagnosis Date   Anemia    Arthritis    Bradycardia    Cataract    Chronic kidney disease    Diabetes mellitus type II    Hypertension    Osteoporosis    Syncope    Weakness     Patient Active Problem List   Diagnosis Date Noted   Osteopenia 04/01/2016   Atrial fibrillation ([place]) 04/22/2014   Lung nodule 02/01/2012   Mixed hyperlipidemia 08/03/2011   Bradycardia 08/03/2011   Hypertension 07/22/2011   Type 2 diabetes mellitus with hyperglycemia, without long-term current use of insulin ([place]) 07/22/2011    Past Surgical History:  Procedure Laterality Date   ABDOMINAL HYSTERECTOMY     CESAREAN SECTION     COLON SURGERY     EYE SURGERY     FRACTURE SURGERY      OB History   No obstetric history on file.      Home Medications    Prior to Admission medications   Medication Sig Start Date End Date Taking? Authorizing Provider  doxycycline (VIBRAMYCIN) 100 MG capsule Take 1 capsule (100 mg total) by mouth 2 (two) times daily. 11/13/21  Yes Bobie Kistler-Warren, Alda Lea, NP  fluticasone (FLONASE) 50 MCG/ACT nasal spray Place 2 sprays into both nostrils daily. 11/13/21  Yes Connelly Spruell-Warren, Alda Lea, NP  ACCU-CHEK AVIVA PLUS test strip  TEST BLOOD SUGAR ONE TIME DAILY 10/15/21   Horald Pollen, MD  Accu-Chek Softclix Lancets lancets TEST BLOOD SUGAR ONE TIME DAILY 07/16/21   Horald Pollen, MD  acetaminophen (TYLENOL) 500 MG tablet Take 1 tablet (500 mg total) by mouth every 6 (six) hours as needed. 11/19/16   Shawnee Knapp, MD  Alcohol Swabs (B-D SINGLE USE SWABS REGULAR) PADS USE TO CHECK BLOOD SUGAR DAILY 09/03/20   Horald Pollen, MD  Blood Glucose Calibration (SURECHEK CONTROL SOLUTION) Normal LIQD Use to check blood sugar daily. Dx E11.65 09/08/15   Shawnee Knapp, MD  Blood Glucose Monitoring Suppl (ACCU-CHEK AVIVA PLUS) w/Device KIT USE AS DIRECTED 08/14/21   Horald Pollen, MD  Blood Glucose Monitoring Suppl (TRUE METRIX AIR GLUCOSE METER) DEVI 1 each by Does not apply route daily. Use to test blood sugar once daily. Dx: E11.65 09/09/15   Ivar Drape D, PA  cloNIDine (CATAPRES) 0.1 MG tablet TAKE 1 TABLET AT BEDTIME 10/08/20   Horald Pollen, MD  diclofenac Sodium (VOLTAREN) 1 % GEL Apply 2 g topically 4 (four) times daily as needed. 07/18/20   Margarita Mail, PA-C  flecainide (TAMBOCOR) 100 MG tablet TAKE 1/2 TABLET TWICE DAILY 02/02/21   Josue Hector, MD  glipiZIDE (GLUCOTROL) 10 MG  tablet TAKE 1 TABLET TWICE DAILY BEFORE MEALS 01/30/21   Horald Pollen, MD  hydrochlorothiazide (HYDRODIURIL) 25 MG tablet Take 1 tablet (25 mg total) by mouth daily. 01/10/20   Jacelyn Pi, Lilia Argue, MD  HYDROcodone-acetaminophen [facility]) 5-325 MG tablet Take 1 tablet by mouth every 6 (six) hours as needed for severe pain. 07/18/20   Harris, Vernie Shanks, PA-C  irbesartan (AVAPRO) 300 MG tablet Take 1 tablet (300 mg total) by mouth daily. 08/26/20   Horald Pollen, MD  irbesartan-hydrochlorothiazide (AVALIDE) 300-12.5 MG tablet TAKE 1 TABLET EVERY DAY Patient taking differently: Take 1 tablet by mouth daily. 09/18/19   Daleen Squibb, MD  meclizine (ANTIVERT) 25 MG tablet TAKE 1 TABLET THREE TIMES DAILY  AS NEEDED FOR DIZZINESS 08/22/20   Horald Pollen, MD  metFORMIN (GLUCOPHAGE-XR) 500 MG 24 hr tablet TAKE 2 TABLETS DAILY WITH BREAKFAST 04/14/21   Sagardia, Ines Bloomer, MD  nystatin cream (MYCOSTATIN) APPLY EXTERNALLY TO THE AFFECTED AREA TWICE DAILY 05/11/21   Horald Pollen, MD  polyethylene glycol powder (GLYCOLAX/MIRALAX) 17 GM/SCOOP powder Take 17 g by mouth 2 (two) times daily as needed. 05/24/19   Jacelyn Pi, Lilia Argue, MD  polyethylene glycol powder [facility]) powder Take 17 g by mouth 2 (two) times daily as needed. 11/19/16   Shawnee Knapp, MD  rivaroxaban (XARELTO) 20 MG TABS tablet Take 1 tablet (20 mg total) by mouth daily with supper. 05/11/21   Josue Hector, MD  simvastatin (ZOCOR) 20 MG tablet TAKE 1 TABLET EVERY DAY AT 6:00PM (NEEDS APPT) 12/30/20   Horald Pollen, MD    Family History Family History  Problem Relation Age of Onset   Diabetes Sister    Hyperlipidemia Sister    Hypertension Sister    Diabetes Brother    Hyperlipidemia Brother    Hypertension Brother     Social History Social History   Tobacco Use   Smoking status: Never   Smokeless tobacco: Never  Vaping Use   Vaping Use: Never used  Substance Use Topics   Alcohol use: No   Drug use: No     Allergies   Codeine   Review of Systems Review of Systems  Respiratory:  Positive for cough.   PER HPI  Physical Exam Triage Vital Signs ED Triage Vitals  Enc Vitals Group     BP 11/13/21 1410 139/84     Pulse Rate 11/13/21 1410 (!) 56     Resp 11/13/21 1410 18     Temp 11/13/21 1410 98.1 F (36.7 C)     Temp src --      SpO2 11/13/21 1410 97 %     Weight --      Height --      Head Circumference --      Peak Flow --      Pain Score 11/13/21 1409 0     Pain Loc --      Pain Edu? --      Excl. in [place]? --    No data found.  Updated Vital Signs BP 139/84   Pulse (!) 56   Temp 98.1 F (36.7 C)   Resp 18   SpO2 97%   Visual Acuity Right Eye Distance:    Left Eye Distance:   Bilateral Distance:    Right Eye Near:   Left Eye Near:    Bilateral Near:     Physical Exam Vitals and nursing note reviewed.  Constitutional:  General: She is not in acute distress.    Appearance: Normal appearance.  HENT:     Head: Normocephalic.     Right Ear: Tympanic membrane, ear canal and external ear normal.     Left Ear: Tympanic membrane, ear canal and external ear normal.     Nose: Congestion and rhinorrhea present.     Right Turbinates: Enlarged and swollen.     Left Turbinates: Enlarged and swollen.     Right Sinus: No maxillary sinus tenderness or frontal sinus tenderness.     Left Sinus: No maxillary sinus tenderness or frontal sinus tenderness.     Mouth/Throat:     Mouth: Mucous membranes are moist.  Eyes:     Extraocular Movements: Extraocular movements intact.     Conjunctiva/sclera: Conjunctivae normal.     Pupils: Pupils are equal, round, and reactive to light.  Cardiovascular:     Rate and Rhythm: Normal rate and regular rhythm.     Pulses: Normal pulses.     Heart sounds: Normal heart sounds.  Pulmonary:     Effort: No respiratory distress.     Breath sounds: Normal breath sounds. No wheezing, rhonchi or rales.  Abdominal:     General: Bowel sounds are normal.     Palpations: Abdomen is soft.     Tenderness: There is no abdominal tenderness.  Musculoskeletal:     Cervical back: Normal range of motion.  Skin:    General: Skin is warm and dry.     Capillary Refill: Capillary refill takes less than 2 seconds.  Neurological:     General: No focal deficit present.     Mental Status: She is alert and oriented to person, place, and time.  Psychiatric:        Mood and Affect: Mood normal.        Behavior: Behavior normal.     UC Treatments / Results  Labs (all labs ordered are listed, but only abnormal results are displayed) Labs Reviewed - No data to display  EKG   Radiology No results  found.  Procedures Procedures (including critical care time)  Medications Ordered in UC Medications - No data to display  Initial Impression / Assessment and Plan / UC Course  I have reviewed the triage vital signs and the nursing notes.  Pertinent labs & imaging results that were available during my care of the patient were reviewed by me and considered in my medical decision making (see chart for details).  Patient presents with cough for 2 weeks.  Patient also has nasal congestion and runny nose.  Her exam is reassuring, her vital signs are stable.  Her lungs are clear throughout.  A chest x-ray has been ordered to rule out pneumonia.  Patient was advised that she will need to go to [facility] to have the x-ray completed.  Symptoms are consistent with a bacterial infection due to the duration of her symptoms.  Patient was prescribed doxycycline which should cover her for upper versus lower respiratory symptoms.  Patient was also provided fluticasone for her nasal congestion.  Supportive care was recommended along with continuing over-the-counter medication for pain.  Patient was advised to follow-up with her primary care physician within the next 7 to 10 days for reevaluation and follow-up. Final Clinical Impressions(s) / UC Diagnoses   Final diagnoses:  Acute upper respiratory infection  Acute cough     Discharge Instructions      Take medication as prescribed. A chest x-ray has been  ordered.  You will need to go to any [facility] to have the chest x-ray performed.  When the results are received, I will give you a call. Increase fluids and allow for plenty of rest. Recommend using a humidifier during sleep to help with cough. Sleep elevated on 2 pillows while symptoms persist. May continue Tylenol as needed for pain, fever, or general discomfort. Please follow-up with your primary care within the next 7 to 10 days for follow-up and reevaluation.     ED Prescriptions      Medication Sig Dispense Auth. Provider   doxycycline (VIBRAMYCIN) 100 MG capsule Take 1 capsule (100 mg total) by mouth 2 (two) times daily. 20 capsule Emaad Nanna-Warren, Alda Lea, NP   fluticasone (FLONASE) 50 MCG/ACT nasal spray Place 2 sprays into both nostrils daily. 16 g Asiah Befort-Warren, Alda Lea, NP      PDMP not reviewed this encounter.   Tish Men, NP 11/13/21 1501    Tish Men, NP 11/18/21 1518    Trexton Escamilla-Warren, Alda Lea, NP 11/18/21 1521

## 2021-11-13 NOTE — ED Triage Notes (Signed)
Pt presents with c/o cough for past 2 weeks

## 2021-11-18 ENCOUNTER — Telehealth: Payer: Self-pay | Admitting: Nurse Practitioner

## 2021-11-18 NOTE — Telephone Encounter (Signed)
Called patient to inform her of her chest x-ray results. Informed chest xray was negative.

## 2022-01-02 ENCOUNTER — Other Ambulatory Visit: Payer: Self-pay | Admitting: Cardiovascular Disease

## 2022-01-02 DIAGNOSIS — I480 Paroxysmal atrial fibrillation: Secondary | ICD-10-CM

## 2022-01-13 DIAGNOSIS — I10 Essential (primary) hypertension: Secondary | ICD-10-CM | POA: Diagnosis not present

## 2022-01-13 DIAGNOSIS — E1165 Type 2 diabetes mellitus with hyperglycemia: Secondary | ICD-10-CM | POA: Diagnosis not present

## 2022-01-14 DIAGNOSIS — H34831 Tributary (branch) retinal vein occlusion, right eye, with macular edema: Secondary | ICD-10-CM | POA: Diagnosis not present

## 2022-01-15 ENCOUNTER — Other Ambulatory Visit: Payer: Self-pay

## 2022-01-15 DIAGNOSIS — I480 Paroxysmal atrial fibrillation: Secondary | ICD-10-CM

## 2022-01-15 MED ORDER — RIVAROXABAN 20 MG PO TABS: 20.0000 mg | ORAL_TABLET | Freq: Every day | ORAL | 2 refills | Status: DC

## 2022-01-15 NOTE — Telephone Encounter (Signed)
Prescription refill request for Xarelto received.  Indication:Afib  Last office visit:09/19/20 (Nishan)  Weight: 83.9kg Age: 81 Scr:1.11 (01/28/21)  CrCl: 50.58m/min   Pt overdue for office visit; however, pt has scheduled appt with Dr NJohnsie Cancelon 04/09/22.

## 2022-02-09 ENCOUNTER — Telehealth: Payer: Self-pay | Admitting: Cardiovascular Disease

## 2022-02-09 DIAGNOSIS — I480 Paroxysmal atrial fibrillation: Secondary | ICD-10-CM

## 2022-02-09 MED ORDER — RIVAROXABAN 20 MG PO TABS: 20.0000 mg | ORAL_TABLET | Freq: Every day | ORAL | 0 refills | Status: DC

## 2022-02-09 NOTE — Telephone Encounter (Signed)
*  STAT* If patient is at the pharmacy, call can be transferred to refill team.   1. Which medications need to be refilled? (please list name of each medication and dose if known) rivaroxaban (XARELTO) 20 MG TABS tablet  2. Which pharmacy/location (including street and city if local pharmacy) is medication to be sent to? [facility], [place] - [address] AT [place]  3. Do they need a 30 day or 90 day supply? 31   Pt has a f/u appt with Dr. Johnsie Cancel 04/09/22

## 2022-02-09 NOTE — Telephone Encounter (Signed)
Prescription refill request for Xarelto received.  Indication:Afib Last office visit:upcoming Weight:83.9 kg Age:81 Scr:1.1 CrCl:51.33  ml/min  Prescription refilled

## 2022-02-10 ENCOUNTER — Other Ambulatory Visit: Payer: Self-pay | Admitting: Cardiovascular Disease

## 2022-02-10 DIAGNOSIS — I480 Paroxysmal atrial fibrillation: Secondary | ICD-10-CM

## 2022-02-10 NOTE — Telephone Encounter (Signed)
Prescription refill request for Xarelto received.  Indication: PAF Last office visit: 09/19/20  Edmonia James MD Weight: 84.8kg Age: 81 Scr: 1.11 on 01/28/21 CrCl: 51.41  Based on above findings Xarelto '20mg'$  daily is the appropriate dose.  Pt has appt scheduled to see Dr Johnsie Cancel and have labs on 04/09/22.  Refill approved.

## 2022-02-12 ENCOUNTER — Other Ambulatory Visit: Payer: Self-pay

## 2022-02-12 ENCOUNTER — Telehealth: Payer: Self-pay | Admitting: Cardiovascular Disease

## 2022-02-12 DIAGNOSIS — I480 Paroxysmal atrial fibrillation: Secondary | ICD-10-CM

## 2022-02-12 MED ORDER — RIVAROXABAN 20 MG PO TABS: ORAL_TABLET | ORAL | 1 refills | Status: DC

## 2022-02-12 NOTE — Telephone Encounter (Signed)
*  STAT* If patient is at the pharmacy, call can be transferred to refill team.   1. Which medications need to be refilled? (please list name of each medication and dose if known) rivaroxaban (XARELTO) 20 MG TABS tablet  2. Which pharmacy/location (including street and city if local pharmacy) is medication to be sent to? [facility], [place] - [address] AT [place]  3. Do they need a 30 day or 90 day supply? [place]

## 2022-02-25 ENCOUNTER — Other Ambulatory Visit: Payer: Self-pay | Admitting: Cardiovascular Disease

## 2022-02-25 DIAGNOSIS — I480 Paroxysmal atrial fibrillation: Secondary | ICD-10-CM

## 2022-03-19 DIAGNOSIS — E1165 Type 2 diabetes mellitus with hyperglycemia: Secondary | ICD-10-CM | POA: Diagnosis not present

## 2022-03-19 DIAGNOSIS — E7800 Pure hypercholesterolemia, unspecified: Secondary | ICD-10-CM | POA: Diagnosis not present

## 2022-03-19 DIAGNOSIS — Z0000 Encounter for general adult medical examination without abnormal findings: Secondary | ICD-10-CM | POA: Diagnosis not present

## 2022-03-19 DIAGNOSIS — I10 Essential (primary) hypertension: Secondary | ICD-10-CM | POA: Diagnosis not present

## 2022-03-19 DIAGNOSIS — E2839 Other primary ovarian failure: Secondary | ICD-10-CM | POA: Diagnosis not present

## 2022-03-22 ENCOUNTER — Other Ambulatory Visit: Payer: Self-pay | Admitting: Family Medicine

## 2022-03-22 DIAGNOSIS — E2839 Other primary ovarian failure: Secondary | ICD-10-CM

## 2022-03-23 ENCOUNTER — Other Ambulatory Visit: Payer: Self-pay | Admitting: Cardiovascular Disease

## 2022-03-23 DIAGNOSIS — I480 Paroxysmal atrial fibrillation: Secondary | ICD-10-CM

## 2022-03-25 NOTE — Telephone Encounter (Signed)
This is a [facility] pt and the pt lives in [place] and has an appt in [place]. Please address

## 2022-04-06 NOTE — Progress Notes (Signed)
Patient ID: Catherine Joseph, female   DOB: November 27, 1940, 81 y.o.   MRN: [id number]      81 y.o.  [place] female history of PAF on flecainide. Has been on coumadin and now xarelto for anticoagulationHistory of atypical chest pain with normal stress echo 2013 and normal myovue 03/21/14   Lives with daughter/husband and 3 grand children ages 27-15 years Originally from [place] and lived in [place] No longer traveling to [place] Had two sisters die of cancer this year Still has nephews in [place]  No palpitations dyspnea or chest pain No bleeding issues [facility]  checks her lab work   Has some chronic vertigo and BPPV  No palpitations or bleeding issues   Seen in ED 07/18/20 with right flank/back pain negative w/u including CT abdomen and negative UA  No cardiac issues Lives with daughter / husband with 4 kids She needs a new primary in [place] Has lots of arthritis but still gets around ok    ROS: Denies fever, malais, weight loss, blurry vision, decreased visual acuity, cough, sputum, SOB, hemoptysis, pleuritic pain, palpitaitons, heartburn, abdominal pain, melena, lower extremity edema, claudication, or rash.  All other systems reviewed and negative  General: BP 120/84   Pulse 61   Ht _0  (1.6 m)   Wt 186 lb (84.4 kg)   SpO2 97%   BMI 32.95 kg/m  Affect appropriate Healthy:  appears stated age 50: normal Neck supple with no adenopathy JVP normal no bruits no thyromegaly Lungs clear with no wheezing and good diaphragmatic motion Heart:  S1/S2 no murmur, no rub, gallop or click PMI normal Abdomen: benighn, BS positve, no tenderness, no AAA no bruit.  No HSM or HJR Distal pulses intact with no bruits No edema Neuro non-focal Skin warm and dry No muscular weakness    Current Outpatient Medications  Medication Sig Dispense Refill   ACCU-CHEK AVIVA PLUS test strip TEST BLOOD SUGAR ONE TIME DAILY 100 strip 1   Accu-Chek Softclix Lancets  lancets TEST BLOOD SUGAR ONE TIME DAILY 100 each 3   acetaminophen (TYLENOL) 500 MG tablet Take 1 tablet (500 mg total) by mouth every 6 (six) hours as needed. 30 tablet 0   Alcohol Swabs (B-D SINGLE USE SWABS REGULAR) PADS USE TO CHECK BLOOD SUGAR DAILY 100 each 3   Blood Glucose Calibration (SURECHEK CONTROL SOLUTION) Normal LIQD Use to check blood sugar daily. Dx E11.65 1 each 3   Blood Glucose Monitoring Suppl (ACCU-CHEK AVIVA PLUS) w/Device KIT USE AS DIRECTED 1 kit 5   Blood Glucose Monitoring Suppl (TRUE METRIX AIR GLUCOSE METER) DEVI 1 each by Does not apply route daily. Use to test blood sugar once daily. Dx: E11.65 1 Device 0   cloNIDine (CATAPRES) 0.1 MG tablet TAKE 1 TABLET AT BEDTIME 90 tablet 0   diclofenac Sodium (VOLTAREN) 1 % GEL Apply 2 g topically 4 (four) times daily as needed. 50 g 0   doxycycline (VIBRAMYCIN) 100 MG capsule Take 1 capsule (100 mg total) by mouth 2 (two) times daily. 20 capsule 0   flecainide (TAMBOCOR) 100 MG tablet TAKE 1/2 TABLET TWICE DAILY (PLEASE KEEP UPCOMING APPT FOR FUTURE REFILLS) 30 tablet 5   fluticasone (FLONASE) 50 MCG/ACT nasal spray Place 2 sprays into both nostrils daily. 16 g 0   glipiZIDE (GLUCOTROL) 10 MG tablet TAKE 1 TABLET TWICE DAILY BEFORE MEALS 180 tablet 1   hydrochlorothiazide (HYDRODIURIL) 25 MG tablet Take 1 tablet (25 mg total) by  mouth daily. 90 tablet 1   HYDROcodone-acetaminophen (NORCO) 5-325 MG tablet Take 1 tablet by mouth every 6 (six) hours as needed for severe pain. 20 tablet 0   irbesartan (AVAPRO) 300 MG tablet Take 1 tablet (300 mg total) by mouth daily. 90 tablet 3   irbesartan-hydrochlorothiazide (AVALIDE) 300-12.5 MG tablet TAKE 1 TABLET EVERY DAY (Patient taking differently: Take 1 tablet by mouth daily.) 90 tablet 0   meclizine (ANTIVERT) 25 MG tablet TAKE 1 TABLET THREE TIMES DAILY AS NEEDED FOR DIZZINESS 90 tablet 0   metFORMIN (GLUCOPHAGE-XR) 500 MG 24 hr tablet TAKE 2 TABLETS DAILY WITH BREAKFAST 180 tablet 1    nystatin cream (MYCOSTATIN) APPLY EXTERNALLY TO THE AFFECTED AREA TWICE DAILY 30 g 5   polyethylene glycol powder (GLYCOLAX/MIRALAX) 17 GM/SCOOP powder Take 17 g by mouth 2 (two) times daily as needed. 255 g 3   polyethylene glycol powder (GLYCOLAX/MIRALAX) powder Take 17 g by mouth 2 (two) times daily as needed. 500 g 1   rivaroxaban (XARELTO) 20 MG TABS tablet TAKE 1 TABLET(20 MG) BY MOUTH DAILY WITH SUPPER 90 tablet 1   simvastatin (ZOCOR) 20 MG tablet TAKE 1 TABLET EVERY DAY AT 6:00PM (NEEDS APPT) 90 tablet 0   Current Facility-Administered Medications  Medication Dose Route Frequency Provider Last Rate Last Admin   cyanocobalamin ((VITAMIN B-12)) injection 1,000 mcg  1,000 mcg Intramuscular Q30 days Shawnee Knapp, MD   1,000 mcg at 09/09/17 1034    Allergies  Codeine  Electrocardiogram:   08/02/18 SR rate 69 PAC otherwise normal  03/09/19 SR rate 69 PAC;s no acute changes 04/09/2022 SR rate 65 QT 430 msec   Assessment and Plan  PAF: maintains NSR on flecainide Anticoagulation with xarelto although there have been some issues affording long discussion with daughter about need to continue both Filled out patient assistance paperwork for xarelto  HTN:  Well controlled.  Continue current medications and low sodium Dash type diet.    Chol:  Continue statin LDL 85 05/24/19   DM:  Discussed low carb diet.  Target hemoglobin A1c is 6.5 or less.  Continue current medications. Lab Results  Component Value Date   HGBA1C 6.4 (A) 08/26/2020    Will try to have her see Dr Patel/Simpson in [place]  F/U with me in a year   Jenkins Rouge

## 2022-04-09 ENCOUNTER — Encounter: Payer: Self-pay | Admitting: Cardiovascular Disease

## 2022-04-09 ENCOUNTER — Ambulatory Visit: Payer: Medicare HMO | Attending: Cardiovascular Disease | Admitting: Cardiovascular Disease

## 2022-04-09 VITALS — BP 120/84 | HR 61 | Ht 63.0 in | Wt 186.0 lb

## 2022-04-09 DIAGNOSIS — I480 Paroxysmal atrial fibrillation: Secondary | ICD-10-CM | POA: Diagnosis not present

## 2022-04-09 DIAGNOSIS — Z7901 Long term (current) use of anticoagulants: Secondary | ICD-10-CM

## 2022-04-09 DIAGNOSIS — I10 Essential (primary) hypertension: Secondary | ICD-10-CM

## 2022-04-09 NOTE — Patient Instructions (Signed)
Medication Instructions:  Your physician recommends that you continue on your current medications as directed. Please refer to the Current Medication list given to you today.  *If you need a refill on your cardiac medications before your next appointment, please call your pharmacy*   Lab Work: NONE   If you have labs (blood work) drawn today and your tests are completely normal, you will receive your results only by: [place] (if you have MyChart) OR A paper copy in the mail If you have any lab test that is abnormal or we need to change your treatment, we will call you to review the results.   Testing/Procedures: NONE    Follow-Up: At [facility], you and your health needs are our priority.  As part of our continuing mission to provide you with exceptional heart care, we have created designated Provider Care Teams.  These Care Teams include your primary Cardiologist (physician) and Advanced Practice Providers (APPs -  Physician Assistants and Nurse Practitioners) who all work together to provide you with the care you need, when you need it.  We recommend signing up for the patient portal called "MyChart".  Sign up information is provided on this After Visit Summary.  MyChart is used to connect with patients for Virtual Visits (Telemedicine).  Patients are able to view lab/test results, encounter notes, upcoming appointments, etc.  Non-urgent messages can be sent to your provider as well.   To learn more about what you can do with MyChart, go to [email or web address].    Your next appointment:   1 year(s)  The format for your next appointment:   In Person  Provider:   Jenkins Rouge, MD    Other Instructions Thank you for choosing [facility]!  \  Important Information About Sugar

## 2022-04-12 NOTE — Progress Notes (Signed)
Called patient left voicemail to call office to schedule visit. 

## 2022-04-15 ENCOUNTER — Ambulatory Visit: Payer: Medicare HMO | Admitting: Internal Medicine

## 2022-04-21 ENCOUNTER — Telehealth: Payer: Self-pay | Admitting: Cardiovascular Disease

## 2022-04-21 NOTE — Telephone Encounter (Signed)
  Pt c/o medication issue:  1. Name of Medication: rivaroxaban (XARELTO) 20 MG TABS tablet  2. How are you currently taking this medication (dosage and times per day)? TAKE 1 TABLET(20 MG) BY MOUTH DAILY WITH SUPPER  3. Are you having a reaction (difficulty breathing--STAT)? No  4. What is your medication issue? Pt said, her xarelto is getting more and more expensive, she said she cant afford getting xarelto anymore

## 2022-04-22 NOTE — Telephone Encounter (Signed)
**Note De-Identified Catherine Joseph Obfuscation** No answer. I left a message on her VM asking her to call [name] back at Dr Kyla Balzarine office at [facility] at [phone] concerning her Xarelto cost.

## 2022-04-29 ENCOUNTER — Encounter: Payer: Self-pay | Admitting: Internal Medicine

## 2022-04-29 ENCOUNTER — Ambulatory Visit (INDEPENDENT_AMBULATORY_CARE_PROVIDER_SITE_OTHER): Payer: Medicare HMO | Admitting: Internal Medicine

## 2022-04-29 VITALS — BP 130/78 | HR 54 | Ht 63.0 in | Wt 187.2 lb

## 2022-04-29 DIAGNOSIS — E782 Mixed hyperlipidemia: Secondary | ICD-10-CM

## 2022-04-29 DIAGNOSIS — I10 Essential (primary) hypertension: Secondary | ICD-10-CM

## 2022-04-29 DIAGNOSIS — E1122 Type 2 diabetes mellitus with diabetic chronic kidney disease: Secondary | ICD-10-CM | POA: Diagnosis not present

## 2022-04-29 DIAGNOSIS — I480 Paroxysmal atrial fibrillation: Secondary | ICD-10-CM | POA: Diagnosis not present

## 2022-04-29 DIAGNOSIS — E1165 Type 2 diabetes mellitus with hyperglycemia: Secondary | ICD-10-CM | POA: Diagnosis not present

## 2022-04-29 DIAGNOSIS — Z7901 Long term (current) use of anticoagulants: Secondary | ICD-10-CM

## 2022-04-29 DIAGNOSIS — Z23 Encounter for immunization: Secondary | ICD-10-CM | POA: Diagnosis not present

## 2022-04-29 NOTE — Assessment & Plan Note (Signed)
Lab Results  Component Value Date   HGBA1C 6.4 (A) 08/26/2020    On metformin and glipizide Advised to follow diabetic diet On statin and ARB F/u CMP and lipid panel Diabetic foot exam: Today Diabetic eye exam: Advised to follow up with Ophthalmology for diabetic eye exam

## 2022-04-29 NOTE — Patient Instructions (Addendum)
Please continue taking medications as prescribed.  Please contact [name] at Dr Kyla Balzarine office at [facility] at [phone] concerning her Xarelto cost.   Please continue to follow low carb diet and ambulate as tolerated.

## 2022-04-29 NOTE — Assessment & Plan Note (Signed)
BP Readings from Last 1 Encounters:  04/29/22 130/78   Well-controlled with irbesartan and clonidine Counseled for compliance with the medications Advised DASH diet and moderate exercise/walking as tolerated

## 2022-04-29 NOTE — Assessment & Plan Note (Signed)
On flecainide and Xarelto Followed by cardiology

## 2022-04-29 NOTE — Progress Notes (Signed)
New Patient Office Visit  Subjective:  Patient ID: Catherine Joseph, female    DOB: 07/28/1940  Age: 81 y.o. MRN: [id number]  CC:  Chief Complaint  Patient presents with   New Patient (Initial Visit)    Establish care, blood pressure, diabetes.    HPI Catherine Joseph is a 81 y.o. female with past medical history of atrial fibrillation, HTN, type II DM, HLD and obesity who presents for establishing care.  HTN and atrial fibrillation: She is currently taking irbesartan 300 mg daily and clonidine 0.1 mg daily.  She is taking flecainide and Xarelto for A-fib.  She is followed by Dr. Johnsie Cancel for it.  She denies any headache, dizziness, chest pain, dyspnea or, palpitations.  Type II DM: She is on metformin and glipizide currently.  Denies any polyuria or polyphagia currently.  She is also on simvastatin for HLD.  Her last BMP in the system showed GFR around 45.  She currently denies any dysuria, hematuria or urinary hesitancy or resistance.   Past Medical History:  Diagnosis Date   Anemia    Arthritis    Bradycardia    Cataract    Chronic kidney disease    Diabetes mellitus type II    Hypertension    Osteoporosis    Syncope    Weakness     Past Surgical History:  Procedure Laterality Date   ABDOMINAL HYSTERECTOMY     CESAREAN SECTION     COLON SURGERY     EYE SURGERY     FRACTURE SURGERY      Family History  Problem Relation Age of Onset   Diabetes Sister    Hyperlipidemia Sister    Hypertension Sister    Diabetes Brother    Hyperlipidemia Brother    Hypertension Brother     Social History   Socioeconomic History   Marital status: Widowed    Spouse name: Not on file   Number of children: Not on file   Years of education: Not on file   Highest education level: Not on file  Occupational History   Not on file  Tobacco Use   Smoking status: Never   Smokeless tobacco: Never  Vaping Use   Vaping Use: Never used  Substance and Sexual Activity    Alcohol use: No   Drug use: No   Sexual activity: Not Currently  Other Topics Concern   Not on file  Social History Narrative   ambudexterty   No caffeine   Two story home    Social Determinants of Health   Financial Resource Strain: Not on file  Food Insecurity: Not on file  Transportation Needs: Not on file  Physical Activity: Not on file  Stress: Not on file  Social Connections: Not on file  Intimate Partner Violence: Not on file    ROS Review of Systems  Constitutional:  Negative for chills and fever.  HENT:  Negative for congestion, sinus pressure, sinus pain and sore throat.   Eyes:  Negative for pain and discharge.  Respiratory:  Negative for cough and shortness of breath.   Cardiovascular:  Negative for chest pain and palpitations.  Gastrointestinal:  Negative for abdominal pain, constipation, diarrhea, nausea and vomiting.  Endocrine: Negative for polydipsia and polyuria.  Genitourinary:  Negative for dysuria and hematuria.  Musculoskeletal:  Negative for neck pain and neck stiffness.  Skin:  Negative for rash.  Neurological:  Negative for dizziness and weakness.  Psychiatric/Behavioral:  Negative for agitation and behavioral problems.  Objective:   Today's Vitals: BP 130/78 (BP Location: Right Arm, Patient Position: Sitting, Cuff Size: Normal)   Pulse (!) 54   Ht _0  (1.6 m)   Wt 187 lb 3.2 oz (84.9 kg)   SpO2 96%   BMI 33.16 kg/m   Physical Exam Vitals reviewed.  Constitutional:      General: She is not in acute distress.    Appearance: She is obese. She is not diaphoretic.  HENT:     Head: Normocephalic and atraumatic.     Nose: Nose normal.     Mouth/Throat:     Mouth: Mucous membranes are moist.  Eyes:     General: No scleral icterus.    Extraocular Movements: Extraocular movements intact.  Cardiovascular:     Rate and Rhythm: Normal rate. Rhythm irregular.     Heart sounds: Normal heart sounds. No murmur heard. Pulmonary:     Breath  sounds: Normal breath sounds. No wheezing or rales.  Abdominal:     Palpations: Abdomen is soft.     Tenderness: There is no abdominal tenderness.  Musculoskeletal:     Cervical back: Neck supple. No tenderness.     Right lower leg: No edema.     Left lower leg: No edema.  Skin:    General: Skin is warm.     Findings: No rash.  Neurological:     General: No focal deficit present.     Mental Status: She is alert and oriented to person, place, and time.     Sensory: No sensory deficit.     Motor: No weakness.  Psychiatric:        Mood and Affect: Mood normal.        Behavior: Behavior normal.     Assessment & Plan:   Problem List Items Addressed This Visit       Cardiovascular and Mediastinum   Hypertension - Primary    BP Readings from Last 1 Encounters:  04/29/22 130/78  Well-controlled with irbesartan and clonidine Counseled for compliance with the medications Advised DASH diet and moderate exercise/walking as tolerated       Atrial fibrillation (HCC)    On flecainide and Xarelto Followed by cardiology        Endocrine   Type 2 diabetes mellitus with hyperglycemia, without long-term current use of insulin (HCC)    Lab Results  Component Value Date   HGBA1C 6.4 (A) 08/26/2020    On metformin and glipizide Advised to follow diabetic diet On statin and ARB F/u CMP and lipid panel Diabetic foot exam: Today Diabetic eye exam: Advised to follow up with Ophthalmology for diabetic eye exam      Relevant Orders   CMP14+EGFR   Hemoglobin A1c   Urine Microalbumin w/creat. ratio     Other   Mixed hyperlipidemia    On simvastatin      Other Visit Diagnoses     Chronic anticoagulation       Relevant Orders   CBC with Differential/Platelet       Outpatient Encounter Medications as of 04/29/2022  Medication Sig   ACCU-CHEK AVIVA PLUS test strip TEST BLOOD SUGAR ONE TIME DAILY   Accu-Chek Softclix Lancets lancets TEST BLOOD SUGAR ONE TIME DAILY    acetaminophen (TYLENOL) 500 MG tablet Take 1 tablet (500 mg total) by mouth every 6 (six) hours as needed.   Alcohol Swabs (B-D SINGLE USE SWABS REGULAR) PADS USE TO CHECK BLOOD SUGAR DAILY   Blood Glucose Calibration ([place]  CONTROL SOLUTION) Normal LIQD Use to check blood sugar daily. Dx E11.65   Blood Glucose Monitoring Suppl (ACCU-CHEK AVIVA PLUS) w/Device KIT USE AS DIRECTED   cloNIDine (CATAPRES) 0.1 MG tablet TAKE 1 TABLET AT BEDTIME   diclofenac Sodium (VOLTAREN) 1 % GEL Apply 2 g topically 4 (four) times daily as needed.   flecainide (TAMBOCOR) 100 MG tablet TAKE 1/2 TABLET TWICE DAILY (PLEASE KEEP UPCOMING APPT FOR FUTURE REFILLS)   fluticasone (FLONASE) 50 MCG/ACT nasal spray Place 2 sprays into both nostrils daily.   glipiZIDE (GLUCOTROL) 10 MG tablet TAKE 1 TABLET TWICE DAILY BEFORE MEALS   irbesartan (AVAPRO) 300 MG tablet Take 1 tablet (300 mg total) by mouth daily.   meclizine (ANTIVERT) 25 MG tablet TAKE 1 TABLET THREE TIMES DAILY AS NEEDED FOR DIZZINESS   metFORMIN (GLUCOPHAGE-XR) 500 MG 24 hr tablet TAKE 2 TABLETS DAILY WITH BREAKFAST   polyethylene glycol powder (GLYCOLAX/MIRALAX) 17 GM/SCOOP powder Take 17 g by mouth 2 (two) times daily as needed.   polyethylene glycol powder (GLYCOLAX/MIRALAX) powder Take 17 g by mouth 2 (two) times daily as needed.   rivaroxaban (XARELTO) 20 MG TABS tablet TAKE 1 TABLET(20 MG) BY MOUTH DAILY WITH SUPPER   simvastatin (ZOCOR) 20 MG tablet TAKE 1 TABLET EVERY DAY AT 6:00PM (NEEDS APPT)   [DISCONTINUED] Blood Glucose Monitoring Suppl (TRUE METRIX AIR GLUCOSE METER) DEVI 1 each by Does not apply route daily. Use to test blood sugar once daily. Dx: E11.65   [DISCONTINUED] doxycycline (VIBRAMYCIN) 100 MG capsule Take 1 capsule (100 mg total) by mouth 2 (two) times daily.   [DISCONTINUED] hydrochlorothiazide (HYDRODIURIL) 25 MG tablet Take 1 tablet (25 mg total) by mouth daily.   [DISCONTINUED] HYDROcodone-acetaminophen (NORCO) 5-325 MG tablet  Take 1 tablet by mouth every 6 (six) hours as needed for severe pain.   [DISCONTINUED] irbesartan-hydrochlorothiazide (AVALIDE) 300-12.5 MG tablet TAKE 1 TABLET EVERY DAY (Patient taking differently: Take 1 tablet by mouth daily.)   [DISCONTINUED] nystatin cream (MYCOSTATIN) APPLY EXTERNALLY TO THE AFFECTED AREA TWICE DAILY   [DISCONTINUED] cyanocobalamin ((VITAMIN B-12)) injection 1,000 mcg    No facility-administered encounter medications on file as of 04/29/2022.    Follow-up: Return in about 4 months (around 08/28/2022) for DM and HTN.   Lindell Spar, MD

## 2022-04-29 NOTE — Assessment & Plan Note (Signed)
On simvastatin.   

## 2022-04-30 NOTE — Telephone Encounter (Signed)
**Note De-Identified Alena Blankenbeckler Obfuscation** I called the pt back but got no answer so I left a detailed message on her VM (okay per DPR) explaining that there is a discount program for Xarelto, Xarelto and Me and that all she would need to do is to call them at [phone] to enroll into their Xarelto program over the phone and agree to pay $85/30 day supply of Xarelto or $240/90 day supply and that no application is required for this program.  I also advised that there is a generic anticoagulant available as well, Warfarin which is the generic form of Coumadin and that if she is interested in changing to that med to let us know.  I did leave my name and the office phone number so she can call back if she has questions.

## 2022-04-30 NOTE — Telephone Encounter (Signed)
Patient returning call.

## 2022-05-03 NOTE — Telephone Encounter (Signed)
**Note De-Identified Catherine Joseph Obfuscation** The pt states that she wants to enroll in the Xarelto with Me's Xarelto program as she states that she can afford $85/30 day supply of Xarelto and/or $240/90 day supply.  I gave her Xarelto with Me's phone number so she can call them and enroll.  She is aware that we are leaving her 2 weeks of Xarelto 20 mg samples in the front office for her to pick up and that this amount should be enough to last until hers arrives from [place] with Me.  She verbalized understanding to information given and thanked me for our assistance.

## 2022-05-03 NOTE — Telephone Encounter (Signed)
Patient returned RN's call to follow-up on getting patient Xarelto.

## 2022-05-03 NOTE — Telephone Encounter (Signed)
Will do. Thanks.

## 2022-05-07 ENCOUNTER — Other Ambulatory Visit: Payer: Self-pay

## 2022-05-07 DIAGNOSIS — I480 Paroxysmal atrial fibrillation: Secondary | ICD-10-CM

## 2022-05-07 MED ORDER — RIVAROXABAN 20 MG PO TABS: ORAL_TABLET | ORAL | 1 refills | Status: DC

## 2022-05-07 NOTE — Telephone Encounter (Signed)
Prescription refill request for Xarelto received.  Indication:afib Last office visit:10/23 Weight:84.3 kg Age:81 Scr:1.1 CrCl:51.94  ml/min  Prescription refilled

## 2022-05-26 DIAGNOSIS — E1165 Type 2 diabetes mellitus with hyperglycemia: Secondary | ICD-10-CM | POA: Diagnosis not present

## 2022-05-26 DIAGNOSIS — Z7901 Long term (current) use of anticoagulants: Secondary | ICD-10-CM | POA: Diagnosis not present

## 2022-05-28 LAB — CMP14+EGFR
ALT: 11 IU/L (ref 0–32)
AST: 19 IU/L (ref 0–40)
Albumin/Globulin Ratio: 1.2 (ref 1.2–2.2)
Albumin: 4.4 g/dL (ref 3.7–4.7)
Alkaline Phosphatase: 110 IU/L (ref 44–121)
BUN/Creatinine Ratio: 13 (ref 12–28)
BUN: 14 mg/dL (ref 8–27)
Bilirubin Total: 0.7 mg/dL (ref 0.0–1.2)
CO2: 26 mmol/L (ref 20–29)
Calcium: 9.3 mg/dL (ref 8.7–10.3)
Chloride: 102 mmol/L (ref 96–106)
Creatinine, Ser: 1.11 mg/dL — ABNORMAL HIGH (ref 0.57–1.00)
Globulin, Total: 3.6 g/dL (ref 1.5–4.5)
Glucose: 195 mg/dL — ABNORMAL HIGH (ref 70–99)
Potassium: 4.3 mmol/L (ref 3.5–5.2)
Sodium: 141 mmol/L (ref 134–144)
Total Protein: 8 g/dL (ref 6.0–8.5)
eGFR: 49 mL/min/{1.73_m2} — ABNORMAL LOW (ref >59)

## 2022-05-28 LAB — CBC WITH DIFFERENTIAL/PLATELET
Basophils Absolute: 0 10*3/uL (ref 0.0–0.2)
Basos: 0 %
EOS (ABSOLUTE): 0.1 10*3/uL (ref 0.0–0.4)
Eos: 1 %
Hematocrit: 42.6 % (ref 34.0–46.6)
Hemoglobin: 13.9 g/dL (ref 11.1–15.9)
Immature Grans (Abs): 0 10*3/uL (ref 0.0–0.1)
Immature Granulocytes: 0 %
Lymphocytes Absolute: 2.3 10*3/uL (ref 0.7–3.1)
Lymphs: 58 %
MCH: 28.1 pg (ref 26.6–33.0)
MCHC: 32.6 g/dL (ref 31.5–35.7)
MCV: 86 fL (ref 79–97)
Monocytes Absolute: 0.2 10*3/uL (ref 0.1–0.9)
Monocytes: 6 %
Neutrophils Absolute: 1.4 10*3/uL (ref 1.4–7.0)
Neutrophils: 35 %
Platelets: 183 10*3/uL (ref 150–450)
RBC: 4.95 x10E6/uL (ref 3.77–5.28)
RDW: 13.7 % (ref 11.7–15.4)
WBC: 4 10*3/uL (ref 3.4–10.8)

## 2022-05-28 LAB — HEMOGLOBIN A1C
Est. average glucose Bld gHb Est-mCnc: 146 mg/dL
Hgb A1c MFr Bld: 6.7 % — ABNORMAL HIGH (ref 4.8–5.6)

## 2022-05-28 LAB — MICROALBUMIN / CREATININE URINE RATIO
Creatinine, Urine: 216.8 mg/dL
Microalb/Creat Ratio: 15 mg/g creat (ref 0–29)
Microalbumin, Urine: 31.5 ug/mL

## 2022-06-05 ENCOUNTER — Ambulatory Visit
Admission: EM | Admit: 2022-06-05 | Discharge: 2022-06-05 | Disposition: A | Discharge status: Home / Self Care | Payer: Medicare HMO | Attending: Family Medicine | Admitting: Family Medicine

## 2022-06-05 ENCOUNTER — Other Ambulatory Visit: Payer: Self-pay

## 2022-06-05 DIAGNOSIS — R030 Elevated blood-pressure reading, without diagnosis of hypertension: Secondary | ICD-10-CM | POA: Diagnosis not present

## 2022-06-05 DIAGNOSIS — R519 Headache, unspecified: Secondary | ICD-10-CM | POA: Diagnosis not present

## 2022-06-05 DIAGNOSIS — R42 Dizziness and giddiness: Secondary | ICD-10-CM

## 2022-06-05 NOTE — ED Triage Notes (Signed)
Pt states she felt a little dizzy this morning took BP and was 170/79. Is not feeling dizzy or headache today. BP is now 154/89. Took BP last night and was 189/100. Pt is on BP medication.

## 2022-06-05 NOTE — Discharge Instructions (Addendum)
Your exam today is very reassuring, as was your EKG.  Keep checking your home blood pressures and write down your readings, follow-up with your primary care provider next week to recheck and go to the emergency department if your symptoms worsen

## 2022-06-09 NOTE — ED Provider Notes (Signed)
[facility]    CSN: [id number] Arrival date & time: 06/05/22  1154      History   Chief Complaint Chief Complaint  Patient presents with   Hypertension    HPI LATEASHA BREUER is a 81 y.o. female.   Presenting today with episode of dizziness, elevated BP and headache that started yesterday. States she feels in her usual state of health today but BP remains a bit high though not as high as yesterday where home readings were running around 189/100. Taking her BP medications faithfully and states she did not check home BPs prior to yesterday so is unsure what they've typically been running. Also has a hx of diabetes, states home BSs have been in the low 100s consistently. Has controlled atrial fibrillation on flecainide. Denies visual changes, mental status changes, extremity numbness tingling weakness.     Past Medical History:  Diagnosis Date   Anemia    Arthritis    Bradycardia    Cataract    Chronic kidney disease    Diabetes mellitus type II    Hypertension    Osteoporosis    Syncope    Weakness     Patient Active Problem List   Diagnosis Date Noted   Osteopenia 04/01/2016   Atrial fibrillation ([place]) 04/22/2014   Lung nodule 02/01/2012   Mixed hyperlipidemia 08/03/2011   Hypertension 07/22/2011   Type 2 diabetes mellitus with hyperglycemia, without long-term current use of insulin ([place]) 07/22/2011    Past Surgical History:  Procedure Laterality Date   ABDOMINAL HYSTERECTOMY     CESAREAN SECTION     COLON SURGERY     EYE SURGERY     FRACTURE SURGERY      OB History   No obstetric history on file.      Home Medications    Prior to Admission medications   Medication Sig Start Date End Date Taking? Authorizing Provider  ACCU-CHEK AVIVA PLUS test strip TEST BLOOD SUGAR ONE TIME DAILY 10/15/21  Yes [place], Ines Bloomer, MD  Accu-Chek Softclix Lancets lancets TEST BLOOD SUGAR ONE TIME DAILY 07/16/21  Yes [place], Ines Bloomer, MD   acetaminophen (TYLENOL) 500 MG tablet Take 1 tablet (500 mg total) by mouth every 6 (six) hours as needed. 11/19/16  Yes Shawnee Knapp, MD  Alcohol Swabs (B-D SINGLE USE SWABS REGULAR) PADS USE TO CHECK BLOOD SUGAR DAILY 09/03/20  Yes Sag[place], Ines Bloomer, MD  Blood Glucose Calibration ([place] CONTROL SOLUTION) Normal LIQD Use to check blood sugar daily. Dx E11.65 09/08/15  Yes Shawnee Knapp, MD  Blood Glucose Monitoring Suppl (ACCU-CHEK AVIVA PLUS) w/Device KIT USE AS DIRECTED 08/14/21  Yes Sagardia, Ines Bloomer, MD  cloNIDine (CATAPRES) 0.1 MG tablet TAKE 1 TABLET AT BEDTIME 10/08/20  Yes Sagardia, Ines Bloomer, MD  diclofenac Sodium (VOLTAREN) 1 % GEL Apply 2 g topically 4 (four) times daily as needed. 07/18/20  Yes Harris, Abigail, PA-C  flecainide (TAMBOCOR) 100 MG tablet TAKE 1/2 TABLET TWICE DAILY (PLEASE KEEP UPCOMING APPT FOR FUTURE REFILLS) 03/25/22  Yes Josue Hector, MD  fluticasone (FLONASE) 50 MCG/ACT nasal spray Place 2 sprays into both nostrils daily. 11/13/21  Yes Leath-Warren, Alda Lea, NP  glipiZIDE (GLUCOTROL) 10 MG tablet TAKE 1 TABLET TWICE DAILY BEFORE MEALS 01/30/21  Yes Sagardia, Ines Bloomer, MD  irbesartan (AVAPRO) 300 MG tablet Take 1 tablet (300 mg total) by mouth daily. 08/26/20  Yes Sagardia, Ines Bloomer, MD  meclizine (ANTIVERT) 25 MG tablet TAKE 1 TABLET THREE  TIMES DAILY AS NEEDED FOR DIZZINESS 08/22/20  Yes Horald Pollen, MD  metFORMIN (GLUCOPHAGE-XR) 500 MG 24 hr tablet TAKE 2 TABLETS DAILY WITH BREAKFAST 04/14/21  Yes Sagardia, Ines Bloomer, MD  rivaroxaban (XARELTO) 20 MG TABS tablet TAKE 1 TABLET(20 MG) BY MOUTH DAILY WITH SUPPER 05/07/22  Yes Josue Hector, MD  simvastatin (ZOCOR) 20 MG tablet TAKE 1 TABLET EVERY DAY AT 6:00PM (NEEDS APPT) 12/30/20  Yes Sagardia, Ines Bloomer, MD  polyethylene glycol powder (GLYCOLAX/MIRALAX) 17 GM/SCOOP powder Take 17 g by mouth 2 (two) times daily as needed. 05/24/19   Jacelyn Pi, Lilia Argue, MD  polyethylene glycol powder  [facility]) powder Take 17 g by mouth 2 (two) times daily as needed. 11/19/16   Shawnee Knapp, MD    Family History Family History  Problem Relation Age of Onset   Diabetes Sister    Hyperlipidemia Sister    Hypertension Sister    Diabetes Brother    Hyperlipidemia Brother    Hypertension Brother     Social History Social History   Tobacco Use   Smoking status: Never   Smokeless tobacco: Never  Vaping Use   Vaping Use: Never used  Substance Use Topics   Alcohol use: No   Drug use: No     Allergies   Codeine   Review of Systems Review of Systems PER HPI  Physical Exam Triage Vital Signs ED Triage Vitals [06/05/22 1440]  Enc Vitals Group     BP (!) 154/89     Pulse Rate 79     Resp 18     Temp 97.9 F (36.6 C)     Temp Source Oral     SpO2 97 %     Weight      Height      Head Circumference      Peak Flow      Pain Score 0     Pain Loc      Pain Edu?      Excl. in [place]?    No data found.  Updated Vital Signs BP (!) 154/89 (BP Location: Right Arm)   Pulse 79   Temp 97.9 F (36.6 C) (Oral)   Resp 18   SpO2 97%   Visual Acuity Right Eye Distance:   Left Eye Distance:   Bilateral Distance:    Right Eye Near:   Left Eye Near:    Bilateral Near:     Physical Exam Vitals and nursing note reviewed.  Constitutional:      Appearance: Normal appearance. She is not ill-appearing.  HENT:     Head: Atraumatic.     Mouth/Throat:     Mouth: Mucous membranes are moist.     Pharynx: Oropharynx is clear. No posterior oropharyngeal erythema.  Eyes:     Extraocular Movements: Extraocular movements intact.     Conjunctiva/sclera: Conjunctivae normal.     Pupils: Pupils are equal, round, and reactive to light.  Cardiovascular:     Rate and Rhythm: Normal rate and regular rhythm.     Heart sounds: Normal heart sounds.  Pulmonary:     Effort: Pulmonary effort is normal.     Breath sounds: Normal breath sounds.  Musculoskeletal:        General:  Normal range of motion.     Cervical back: Normal range of motion and neck supple.  Skin:    General: Skin is warm and dry.  Neurological:     General: No focal deficit present.  Mental Status: She is alert and oriented to person, place, and time.     Cranial Nerves: No cranial nerve deficit.     Sensory: No sensory deficit.     Motor: No weakness.     Gait: Gait normal.  Psychiatric:        Mood and Affect: Mood normal.        Thought Content: Thought content normal.        Judgment: Judgment normal.    UC Treatments / Results  Labs (all labs ordered are listed, but only abnormal results are displayed) Labs Reviewed - No data to display  EKG  Radiology No results found.  Procedures Procedures (including critical care time)  Medications Ordered in UC Medications - No data to display  Initial Impression / Assessment and Plan / UC Course  I have reviewed the triage vital signs and the nursing notes.  Pertinent labs & imaging results that were available during my care of the patient were reviewed by me and considered in my medical decision making (see chart for details).     Mildly hypertensive today, otherwise vitals and exam very reassuring. No focal abnormalities on exam and her physical symptoms have resolved today. EKG showing NSR without acute ST or T wave changes, her BSs are under good control per her home readings. Discussed close monitoring, DASH diet, stress control and close follow up with PCP. ED for worsening sxs.  Final Clinical Impressions(s) / UC Diagnoses   Final diagnoses:  Elevated blood pressure reading  Dizziness  Acute nonintractable headache, unspecified headache type     Discharge Instructions      Your exam today is very reassuring, as was your EKG.  Keep checking your home blood pressures and write down your readings, follow-up with your primary care provider next week to recheck and go to the emergency department if your symptoms  worsen    ED Prescriptions   None    PDMP not reviewed this encounter.   Volney American, [place] 06/09/22 2203

## 2022-07-05 ENCOUNTER — Encounter: Payer: Self-pay | Admitting: Internal Medicine

## 2022-07-05 ENCOUNTER — Ambulatory Visit (INDEPENDENT_AMBULATORY_CARE_PROVIDER_SITE_OTHER): Payer: Medicare PPO | Admitting: Internal Medicine

## 2022-07-05 VITALS — BP 170/75 | HR 70 | Resp 16 | Ht 63.0 in | Wt 189.0 lb

## 2022-07-05 DIAGNOSIS — Z0000 Encounter for general adult medical examination without abnormal findings: Secondary | ICD-10-CM | POA: Insufficient documentation

## 2022-07-05 DIAGNOSIS — I10 Essential (primary) hypertension: Secondary | ICD-10-CM | POA: Diagnosis not present

## 2022-07-05 MED ORDER — CLONIDINE HCL 0.1 MG PO TABS: 0.1000 mg | ORAL_TABLET | Freq: Two times a day (BID) | ORAL | 1 refills | Status: DC

## 2022-07-05 NOTE — Assessment & Plan Note (Signed)
Patient BP elevated ,BP: (!) 170/75 . She has been checking at home and BP elevated consistently to this level. Review of prior records show BP controlled on Irbesartan and clonidine. She was tried on amlodipine previously and experienced lowe extremity swelling. Taking clonidine once daily before bed and checking blood pressure in afternoons.   Assessment/Plan: Chronic illness with exacerbation - Start taking Clonidine 1 mg twice daily ( this medication only last 12 hours) - Continue to record your blood pressure - Follow up with Dr.Patel at schedule appointment in March or sooner if your systolic blood pressure is >150

## 2022-07-05 NOTE — Patient Instructions (Addendum)
  Catherine Joseph , Thank you for taking time to come for your Medicare Wellness Visit. I appreciate your ongoing commitment to your health goals. Please review the following plan we discussed and let me know if I can assist you in the future.   These are the goals we discussed: Patient would like for her knee pain to improve.   High Blood Pressure - Start taking Clonidine 1 mg twice daily ( this medication only last 12 hours) - Continue to record your blood pressure - Follow up with Dr.Patel at schedule appointment in March or sooner if your systolic blood pressure is >150   This is a list of the screening recommended for you and due dates:  Health Maintenance  Topic Date Due   Zoster (Shingles) Vaccine (1 of 2) Never done   COVID-19 Vaccine (3 - Pfizer risk series) 12/07/2019   Eye exam for diabetics  07/15/2021   Hemoglobin A1C  11/25/2022   Medicare Annual Wellness Visit  03/20/2023   Complete foot exam   04/30/2023   Yearly kidney function blood test for diabetes  05/27/2023   Yearly kidney health urinalysis for diabetes  05/27/2023   DTaP/Tdap/Td vaccine (2 - Tdap) 11/10/2024   Pneumonia Vaccine  Completed   Flu Shot  Completed   DEXA scan (bone density measurement)  Completed   HPV Vaccine  Aged Out

## 2022-07-05 NOTE — Assessment & Plan Note (Deleted)
Patient BP elevated ,BP: (!) 170/75 . She has been checking at home and BP elevated consistently to this level. Review of prior records show BP controlled on Irbesartan and clonidine. She was tried on amlodipine previously and experienced lowe extremity swelling. Taking clonidine once daily before bed and checking blood pressure in afternoons.   Assessment/Plan: Chronic illness with exacerbation - Start taking Clonidine 1 mg twice daily ( this medication only last 12 hours) - Continue to record your blood pressure - Follow up with Dr.Patel at schedule appointment in March or sooner if your systolic blood pressure is >150

## 2022-07-05 NOTE — Progress Notes (Signed)
Subjective:   Catherine Joseph is a 82 y.o. female who presents for Medicare Annual (Subsequent) preventive examination.  In addition she has concerns for elevated blood pressure. For the details of HTN,  please refer to the assessment and plan.  Review of Systems    Review of Systems  All other systems reviewed and are negative.    Objective:    Today's Vitals   07/05/22 1553 07/05/22 1606  BP: (!) 170/75   Pulse: 70   Resp: 16   SpO2: 97%   Weight: 189 lb (85.7 kg)   Height: '5\' 3"'$  (1.6 m)   PainSc:  8    Body mass index is 33.48 kg/m.     07/05/2022    4:09 PM 07/18/2020   11:25 AM 04/21/2020    2:49 PM 12/04/2019   12:28 PM 03/05/2019    2:11 PM 04/16/2017   10:49 AM 04/08/2017   10:52 AM  Advanced Directives  Does Patient Have a Medical Advance Directive? No No Yes No Yes No No  Does patient want to make changes to medical advance directive?     No - Patient declined    Would patient like information on creating a medical advance directive? Yes (ED - Information included in AVS) No - Patient declined  No - Patient declined  No - Patient declined No - Patient declined    Current Medications (verified) Outpatient Encounter Medications as of 07/05/2022  Medication Sig   ACCU-CHEK AVIVA PLUS test strip TEST BLOOD SUGAR ONE TIME DAILY   Accu-Chek Softclix Lancets lancets TEST BLOOD SUGAR ONE TIME DAILY   acetaminophen (TYLENOL) 500 MG tablet Take 1 tablet (500 mg total) by mouth every 6 (six) hours as needed.   Alcohol Swabs (B-D SINGLE USE SWABS REGULAR) PADS USE TO CHECK BLOOD SUGAR DAILY   Blood Glucose Calibration (SURECHEK CONTROL SOLUTION) Normal LIQD Use to check blood sugar daily. Dx E11.65   Blood Glucose Monitoring Suppl (ACCU-CHEK AVIVA PLUS) w/Device KIT USE AS DIRECTED   cloNIDine (CATAPRES) 0.1 MG tablet TAKE 1 TABLET AT BEDTIME   diclofenac Sodium (VOLTAREN) 1 % GEL Apply 2 g topically 4 (four) times daily as needed.   flecainide (TAMBOCOR) 100 MG  tablet TAKE 1/2 TABLET TWICE DAILY (PLEASE KEEP UPCOMING APPT FOR FUTURE REFILLS)   fluticasone (FLONASE) 50 MCG/ACT nasal spray Place 2 sprays into both nostrils daily.   glipiZIDE (GLUCOTROL) 10 MG tablet TAKE 1 TABLET TWICE DAILY BEFORE MEALS   irbesartan (AVAPRO) 300 MG tablet Take 1 tablet (300 mg total) by mouth daily.   meclizine (ANTIVERT) 25 MG tablet TAKE 1 TABLET THREE TIMES DAILY AS NEEDED FOR DIZZINESS   metFORMIN (GLUCOPHAGE-XR) 500 MG 24 hr tablet TAKE 2 TABLETS DAILY WITH BREAKFAST   polyethylene glycol powder (GLYCOLAX/MIRALAX) 17 GM/SCOOP powder Take 17 g by mouth 2 (two) times daily as needed.   polyethylene glycol powder (GLYCOLAX/MIRALAX) powder Take 17 g by mouth 2 (two) times daily as needed.   rivaroxaban (XARELTO) 20 MG TABS tablet TAKE 1 TABLET(20 MG) BY MOUTH DAILY WITH SUPPER   simvastatin (ZOCOR) 20 MG tablet TAKE 1 TABLET EVERY DAY AT 6:00PM (NEEDS APPT)   No facility-administered encounter medications on file as of 07/05/2022.    Allergies (verified) Codeine   History: Past Medical History:  Diagnosis Date   Anemia    Arthritis    Bradycardia    Cataract    Chronic kidney disease    Diabetes mellitus type II  Hypertension    Osteoporosis    Syncope    Weakness    Past Surgical History:  Procedure Laterality Date   ABDOMINAL HYSTERECTOMY     CESAREAN SECTION     COLON SURGERY     EYE SURGERY     FRACTURE SURGERY     Family History  Problem Relation Age of Onset   Diabetes Sister    Hyperlipidemia Sister    Hypertension Sister    Diabetes Brother    Hyperlipidemia Brother    Hypertension Brother    Social History   Socioeconomic History   Marital status: Widowed    Spouse name: Not on file   Number of children: Not on file   Years of education: Not on file   Highest education level: Not on file  Occupational History   Not on file  Tobacco Use   Smoking status: Never   Smokeless tobacco: Never  Vaping Use   Vaping Use: Never  used  Substance and Sexual Activity   Alcohol use: No   Drug use: No   Sexual activity: Not Currently  Other Topics Concern   Not on file  Social History Narrative   ambudexterty   No caffeine   Two story home    Social Determinants of Health   Financial Resource Strain: Not on file  Food Insecurity: Not on file  Transportation Needs: Not on file  Physical Activity: Not on file  Stress: Not on file  Social Connections: Not on file    Tobacco Counseling Counseling given: Not Answered   Clinical Intake:  Pre-visit preparation completed: Yes  Pain : 0-10 Pain Score: 8  Pain Type: Chronic pain Pain Location: Back Pain Orientation: Lower Pain Descriptors / Indicators: Aching Pain Onset: More than a month ago Pain Frequency: Several days a week     Diabetes: Yes  How often do you need to have someone help you when you read instructions, pamphlets, or other written materials from your doctor or pharmacy?: 1 - Never What is the last grade level you completed in 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 0  Vision? 0  Difficulty concentrating or making decisions? 0  Walking or climbing stairs? 0  Dressing or bathing? 0  Doing errands, shopping? 0    Patient Care Team: Lindell Spar, MD as PCP - General (Internal Medicine) Josue Hector, MD as PCP - Cardiology (Cardiology) Alexis Frock, MD as Consulting Physician (Urology) Josue Hector, MD as Consulting Physician (Cardiology) Idolina Primer [facility]) Carol Ada, MD as Consulting Physician (Gastroenterology)  Indicate any recent Medical Services you may have received from other than [facility] providers in the past year (date may be approximate).     Assessment:   This is a routine wellness examination for  [place].  Hearing/Vision screen No results found.  Dietary issues and exercise activities discussed:     Goals Addressed   None    Depression Screen    07/05/2022    4:11 PM 04/29/2022    4:01 PM 08/26/2020    3:51 PM 07/03/2020    4:53 PM 04/01/2020    4:04 PM 01/10/2020    3:39 PM 10/08/2019    5:43 PM  PHQ 2/9 Scores  PHQ - 2 Score 0 0 0 0 0  0 0    Fall Risk    07/05/2022    4:11 PM 07/05/2022    3:53 PM 04/29/2022    4:01 PM 08/26/2020    3:51 PM 07/03/2020    4:53 PM  [place] in the past year? 0 0 0 0 0  Number falls in past yr: 0 0 0    Injury with Fall? 0 0 0    Risk for fall due to :   No Fall Risks    Follow up   Falls evaluation completed Falls evaluation completed Falls evaluation completed    [place]:  Any stairs in or around the home? Yes  If so, are there any without handrails? Yes  Home free of loose throw rugs in walkways, pet beds, electrical cords, etc? Yes  Adequate lighting in your home to reduce risk of falls? Yes   ASSISTIVE DEVICES UTILIZED TO PREVENT FALLS:  Life alert? No  Use of a cane, walker or w/c? No  Grab bars in the bathroom? No  Shower chair or bench in shower? No  Elevated toilet seat or a handicapped toilet? No    Cognitive Function:        07/05/2022    4:11 PM 03/05/2019    2:06 PM  6CIT Screen  What Year? 0 points 0 points  What month? 0 points 0 points  What time? 0 points 0 points  Count back from 20 0 points 0 points  Months in reverse 0 points 0 points  Repeat phrase 6 points 6 points  Total Score 6 points 6 points    Immunizations Immunization History  Administered Date(s) Administered   Fluad Quad(high Dose 65+) 04/12/2019, 04/01/2020, 04/29/2022   Influenza Split 03/31/2012, 02/20/2016   Influenza, High Dose Seasonal PF 03/06/2016, 03/20/2018   Influenza,inj,Quad PF,6+ Mos 02/20/2013, 04/11/2014, 06/09/2015, 03/24/2017   PFIZER(Purple Top)SARS-COV-2  Vaccination 10/19/2019, 11/09/2019   Pneumococcal Conjugate-13 12/03/2013   Pneumococcal Polysaccharide-23 05/01/2012   Td 11/11/2014   Zoster, Live 06/30/2012    TDAP status: Up to date  Flu Vaccine status: Up to date  Pneumococcal vaccine status: Up to date  Covid-19 vaccine status: Information provided on how to obtain vaccines.   Qualifies for Shingles Vaccine? Yes   Zostavax completed No   Shingrix Completed?: No.    Education has been provided regarding the importance of this vaccine. Patient has been advised to call insurance company to determine out of pocket expense if they have not yet received this vaccine. Advised may also receive vaccine at local pharmacy or Health Dept. Verbalized acceptance and understanding.  Screening Tests Health Maintenance  Topic Date Due   Zoster Vaccines- Shingrix (1 of 2) Never done   COVID-19 Vaccine (3 - Pfizer risk series) 12/07/2019   OPHTHALMOLOGY EXAM  07/15/2021   HEMOGLOBIN A1C  11/25/2022   Medicare Annual Wellness (AWV)  03/20/2023   FOOT EXAM  04/30/2023   Diabetic kidney evaluation - eGFR measurement  05/27/2023   Diabetic kidney evaluation - Urine ACR  05/27/2023   DTaP/Tdap/Td (2 - Tdap) 11/10/2024   Pneumonia Vaccine 76+ Years old  Completed   INFLUENZA VACCINE  Completed   DEXA SCAN  Completed   HPV VACCINES  Aged Out    Health Maintenance  Health Maintenance Due  Topic Date Due   Zoster Vaccines- Shingrix (1 of 2) Never done   COVID-19 Vaccine (3 - Pfizer risk series) 12/07/2019   OPHTHALMOLOGY EXAM  07/15/2021    Colorectal cancer screening: No longer required.   Mammogram status: No longer required due to age.  Bone Density status: Ordered 09/02/2022. Pt provided with contact info and advised to call to schedule appt.  Lung Cancer Screening: (Low Dose CT Chest recommended if Age 38-80 years, 30 pack-year currently smoking OR have quit w/in 15years.) does not qualify.   Additional Screening:  Hepatitis C  Screening: does not qualify  Vision Screening: Recommended annual ophthalmology exams for early detection of glaucoma and other disorders of the eye. Is the patient up to date with their annual eye exam?  No  Who is the provider or what is the name of the office in which the patient attends annual eye exams?  If pt is not established with a provider, would they like to be referred to a provider to establish care? No . She has a eye doctor in [place] who is referring her to a new eye doctor.   Dental Screening: Recommended annual dental exams for proper oral hygiene  Community Resource Referral / Chronic Care Management: CRR required this visit?  No   CCM required this visit?  No      Plan:   Hypertension Patient BP elevated ,BP: (!) 170/75 . She has been checking at home and BP elevated consistently to this level. Review of prior records show BP controlled on Irbesartan and clonidine. She was tried on amlodipine previously and experienced lowe extremity swelling. Taking clonidine once daily before bed and checking blood pressure in afternoons.   Assessment/Plan: Chronic illness with exacerbation - Start taking Clonidine 1 mg twice daily ( this medication only last 12 hours) - Continue to record your blood pressure - Follow up with Dr.Patel at schedule appointment in March or sooner if your systolic blood pressure is >150     I have personally reviewed and noted the following in the patient's chart:   Medical and social history Use of alcohol, tobacco or illicit drugs  Current medications and supplements including opioid prescriptions. Patient is not currently taking opioid prescriptions. Functional ability and status Nutritional status Physical activity Advanced directives List of other physicians Hospitalizations, surgeries, and ER visits in previous 12 months Vitals Screenings to include cognitive, depression, and falls Referrals and appointments  In addition, I have  reviewed and discussed with patient certain preventive protocols, quality metrics, and best practice recommendations. A written personalized care plan for preventive services as well as general preventive health recommendations were provided to patient.     Lorene Dy, MD   07/05/2022

## 2022-07-21 DIAGNOSIS — I10 Essential (primary) hypertension: Secondary | ICD-10-CM | POA: Diagnosis not present

## 2022-07-21 DIAGNOSIS — E1165 Type 2 diabetes mellitus with hyperglycemia: Secondary | ICD-10-CM | POA: Diagnosis not present

## 2022-08-21 ENCOUNTER — Other Ambulatory Visit: Payer: Self-pay | Admitting: Cardiovascular Disease

## 2022-08-21 DIAGNOSIS — I480 Paroxysmal atrial fibrillation: Secondary | ICD-10-CM

## 2022-08-30 ENCOUNTER — Ambulatory Visit (INDEPENDENT_AMBULATORY_CARE_PROVIDER_SITE_OTHER): Payer: Medicare PPO | Admitting: Internal Medicine

## 2022-08-30 ENCOUNTER — Encounter: Payer: Self-pay | Admitting: Internal Medicine

## 2022-08-30 VITALS — BP 144/70 | HR 80 | Ht 63.0 in | Wt 188.0 lb

## 2022-08-30 DIAGNOSIS — E782 Mixed hyperlipidemia: Secondary | ICD-10-CM

## 2022-08-30 DIAGNOSIS — E1169 Type 2 diabetes mellitus with other specified complication: Secondary | ICD-10-CM | POA: Diagnosis not present

## 2022-08-30 DIAGNOSIS — I10 Essential (primary) hypertension: Secondary | ICD-10-CM

## 2022-08-30 DIAGNOSIS — I480 Paroxysmal atrial fibrillation: Secondary | ICD-10-CM | POA: Diagnosis not present

## 2022-08-30 MED ORDER — HYDRALAZINE HCL 25 MG PO TABS: 25.0000 mg | ORAL_TABLET | Freq: Three times a day (TID) | ORAL | 1 refills | Status: DC

## 2022-08-30 NOTE — Assessment & Plan Note (Addendum)
On simvastatin °Check lipid profile °

## 2022-08-30 NOTE — Assessment & Plan Note (Signed)
Lab Results  Component Value Date   HGBA1C 6.7 (H) 05/26/2022   Well-controlled Associated with HTN and HLD On metformin and glipizide Advised to follow diabetic diet On statin and ARB F/u CMP and lipid panel Diabetic eye exam: Advised to follow up with Ophthalmology for diabetic eye exam

## 2022-08-30 NOTE — Patient Instructions (Addendum)
Please start taking Hydralazine 25 mg 3 times daily.  Please stop taking Clonidine.  Please continue taking other medications as prescribed.  Please continue to follow low salt diet and ambulate as tolerated.  Please get fasting blood tests done before your next visit.

## 2022-08-30 NOTE — Assessment & Plan Note (Signed)
On flecainide and Xarelto Followed by cardiology 

## 2022-08-30 NOTE — Assessment & Plan Note (Addendum)
BP Readings from Last 1 Encounters:  08/30/22 (!) 159/79   Uncontrolled with irbesartan 300 mg QD and clonidine 0.1 mg BID Had leg swelling with amlodipine Switched from clonidine to hydralazine 25 mg TID Clonidine can cause reflex tachycardia, she already has history of A-fib, would avoid clonidine if possible Counseled for compliance with the medications Advised DASH diet and moderate exercise/walking as tolerated

## 2022-08-30 NOTE — Progress Notes (Signed)
Established Patient Office Visit  Subjective:  Patient ID: Catherine Joseph, female    DOB: [birth year]  Age: 82 y.o. MRN: [id number]  CC:  Chief Complaint  Patient presents with   Hypertension    Four month follow up for hypertension and diabetes. Patient states she is having pain in her side for three months    HPI SHYA Catherine Joseph is a 82 y.o. female with past medical history of atrial fibrillation, HTN, type II DM, HLD and obesity who presents for f/u of her chronic medical conditions.  HTN and atrial fibrillation: She is currently taking irbesartan 300 mg QD and clonidine 0.1 mg BID.  She had an urgent care visit for elevated BP, and was later seen by Dr. Court Joy.  She was advised to take clonidine twice daily instead of QD.  Her blood pressure has improved, but still is elevated. She is taking flecainide and Xarelto for A-fib.  She is followed by Dr. Johnsie Cancel for it.  She denies any headache, dizziness, chest pain, dyspnea or, palpitations.  Type II DM: She is on metformin and glipizide currently.  Denies any polyuria or polyphagia currently.  She is also on simvastatin for HLD.   Her last BMP showed GFR around 49.  She currently denies any dysuria, hematuria or urinary hesitancy or resistance.    Past Medical History:  Diagnosis Date   Anemia    Arthritis    Bradycardia    Cataract    Chronic kidney disease    Diabetes mellitus type II    Hypertension    Osteoporosis    Syncope    Weakness     Past Surgical History:  Procedure Laterality Date   ABDOMINAL HYSTERECTOMY     CESAREAN SECTION     COLON SURGERY     EYE SURGERY     FRACTURE SURGERY      Family History  Problem Relation Age of Onset   Diabetes Sister    Hyperlipidemia Sister    Hypertension Sister    Diabetes Brother    Hyperlipidemia Brother    Hypertension Brother     Social History   Socioeconomic History   Marital status: Widowed    Spouse name: Not on file   Number of children: Not  on file   Years of education: Not on file   Highest education level: Not on file  Occupational History   Not on file  Tobacco Use   Smoking status: Never   Smokeless tobacco: Never  Vaping Use   Vaping Use: Never used  Substance and Sexual Activity   Alcohol use: No   Drug use: No   Sexual activity: Not Currently  Other Topics Concern   Not on file  Social History Narrative   ambudexterty   No caffeine   Two story home    Social Determinants of Health   Financial Resource Strain: Not on file  Food Insecurity: Not on file  Transportation Needs: Not on file  Physical Activity: Not on file  Stress: Not on file  Social Connections: Not on file  Intimate Partner Violence: Not on file    Outpatient Medications Prior to Visit  Medication Sig Dispense Refill   ACCU-CHEK AVIVA PLUS test strip TEST BLOOD SUGAR ONE TIME DAILY 100 strip 1   Accu-Chek Softclix Lancets lancets TEST BLOOD SUGAR ONE TIME DAILY 100 each 3   acetaminophen (TYLENOL) 500 MG tablet Take 1 tablet (500 mg total) by mouth every 6 (six) hours as  needed. 30 tablet 0   Alcohol Swabs (B-D SINGLE USE SWABS REGULAR) PADS USE TO CHECK BLOOD SUGAR DAILY 100 each 3   Blood Glucose Calibration (SURECHEK CONTROL SOLUTION) Normal LIQD Use to check blood sugar daily. Dx E11.65 1 each 3   Blood Glucose Monitoring Suppl (ACCU-CHEK AVIVA PLUS) w/Device KIT USE AS DIRECTED 1 kit 5   diclofenac Sodium (VOLTAREN) 1 % GEL Apply 2 g topically 4 (four) times daily as needed. 50 g 0   flecainide (TAMBOCOR) 100 MG tablet TAKE 1/2 TABLET TWICE DAILY (PLEASE KEEP UPCOMING APPT FOR FUTURE REFILLS) 90 tablet 2   fluticasone (FLONASE) 50 MCG/ACT nasal spray Place 2 sprays into both nostrils daily. 16 g 0   glipiZIDE (GLUCOTROL) 10 MG tablet TAKE 1 TABLET TWICE DAILY BEFORE MEALS 180 tablet 1   irbesartan (AVAPRO) 300 MG tablet Take 1 tablet (300 mg total) by mouth daily. 90 tablet 3   meclizine (ANTIVERT) 25 MG tablet TAKE 1 TABLET THREE  TIMES DAILY AS NEEDED FOR DIZZINESS 90 tablet 0   metFORMIN (GLUCOPHAGE-XR) 500 MG 24 hr tablet TAKE 2 TABLETS DAILY WITH BREAKFAST 180 tablet 1   polyethylene glycol powder (GLYCOLAX/MIRALAX) 17 GM/SCOOP powder Take 17 g by mouth 2 (two) times daily as needed. 255 g 3   polyethylene glycol powder (GLYCOLAX/MIRALAX) powder Take 17 g by mouth 2 (two) times daily as needed. 500 g 1   rivaroxaban (XARELTO) 20 MG TABS tablet TAKE 1 TABLET(20 MG) BY MOUTH DAILY WITH SUPPER 90 tablet 1   simvastatin (ZOCOR) 20 MG tablet TAKE 1 TABLET EVERY DAY AT 6:00PM (NEEDS APPT) 90 tablet 0   cloNIDine (CATAPRES) 0.1 MG tablet Take 1 tablet (0.1 mg total) by mouth 2 (two) times daily. 90 tablet 1   No facility-administered medications prior to visit.    Allergies  Allergen Reactions   Codeine Other (See Comments) and Rash    vertigo vertigo     ROS Review of Systems  Constitutional:  Negative for chills and fever.  HENT:  Negative for congestion, sinus pressure, sinus pain and sore throat.   Eyes:  Negative for pain and discharge.  Respiratory:  Negative for cough and shortness of breath.   Cardiovascular:  Negative for chest pain and palpitations.  Gastrointestinal:  Negative for abdominal pain, constipation, diarrhea, nausea and vomiting.  Endocrine: Negative for polydipsia and polyuria.  Genitourinary:  Negative for dysuria and hematuria.  Musculoskeletal:  Negative for neck pain and neck stiffness.  Skin:  Negative for rash.  Neurological:  Negative for dizziness and weakness.  Psychiatric/Behavioral:  Negative for agitation and behavioral problems.       Objective:    Physical Exam Vitals reviewed.  Constitutional:      General: She is not in acute distress.    Appearance: She is obese. She is not diaphoretic.  HENT:     Head: Normocephalic and atraumatic.     Nose: Nose normal.     Mouth/Throat:     Mouth: Mucous membranes are moist.  Eyes:     General: No scleral icterus.     Extraocular Movements: Extraocular movements intact.  Cardiovascular:     Rate and Rhythm: Normal rate. Rhythm irregular.     Heart sounds: Normal heart sounds. No murmur heard. Pulmonary:     Breath sounds: Normal breath sounds. No wheezing or rales.  Musculoskeletal:     Cervical back: Neck supple. No tenderness.     Right lower leg: No edema.  Left lower leg: No edema.  Skin:    General: Skin is warm.     Findings: No rash.  Neurological:     General: No focal deficit present.     Mental Status: She is alert and oriented to person, place, and time.     Sensory: No sensory deficit.     Motor: No weakness.  Psychiatric:        Mood and Affect: Mood normal.        Behavior: Behavior normal.     BP (!) 144/70 (BP Location: Left Arm, Cuff Size: Normal)   Pulse 80   Ht '5\' 3"'$  (1.6 m)   Wt 188 lb (85.3 kg)   SpO2 98%   BMI 33.30 kg/m  Wt Readings from Last 3 Encounters:  08/30/22 188 lb (85.3 kg)  07/05/22 189 lb (85.7 kg)  04/29/22 187 lb 3.2 oz (84.9 kg)    Lab Results  Component Value Date   TSH 1.510 11/22/2018   Lab Results  Component Value Date   WBC 4.0 05/26/2022   HGB 13.9 05/26/2022   HCT 42.6 05/26/2022   MCV 86 05/26/2022   PLT 183 05/26/2022   Lab Results  Component Value Date   NA 141 05/26/2022   K 4.3 05/26/2022   CO2 26 05/26/2022   GLUCOSE 195 (H) 05/26/2022   BUN 14 05/26/2022   CREATININE 1.11 (H) 05/26/2022   BILITOT 0.7 05/26/2022   ALKPHOS 110 05/26/2022   AST 19 05/26/2022   ALT 11 05/26/2022   PROT 8.0 05/26/2022   ALBUMIN 4.4 05/26/2022   CALCIUM 9.3 05/26/2022   ANIONGAP 9 07/18/2020   EGFR 49 (L) 05/26/2022   GFR 63.85 03/17/2015   Lab Results  Component Value Date   CHOL 155 08/28/2020   Lab Results  Component Value Date   HDL 64 08/28/2020   Lab Results  Component Value Date   LDLCALC 75 08/28/2020   Lab Results  Component Value Date   TRIG 84 08/28/2020   Lab Results  Component Value Date   CHOLHDL 2.4  08/28/2020   Lab Results  Component Value Date   HGBA1C 6.7 (H) 05/26/2022      Assessment & Plan:   Problem List Items Addressed This Visit       Cardiovascular and Mediastinum   Hypertension - Primary    BP Readings from Last 1 Encounters:  08/30/22 (!) 159/79  Uncontrolled with irbesartan 300 mg QD and clonidine 0.1 mg BID Had leg swelling with amlodipine Switched from clonidine to hydralazine 25 mg TID Clonidine can cause reflex tachycardia, she already has history of A-fib, would avoid clonidine if possible Counseled for compliance with the medications Advised DASH diet and moderate exercise/walking as tolerated      Relevant Medications   hydrALAZINE (APRESOLINE) 25 MG tablet   Atrial fibrillation (HCC)    On flecainide and Xarelto Followed by cardiology      Relevant Medications   hydrALAZINE (APRESOLINE) 25 MG tablet     Endocrine   Type 2 diabetes mellitus with other specified complication (HCC)    Lab Results  Component Value Date   HGBA1C 6.7 (H) 05/26/2022   Well-controlled Associated with HTN and HLD On metformin and glipizide Advised to follow diabetic diet On statin and ARB F/u CMP and lipid panel Diabetic eye exam: Advised to follow up with Ophthalmology for diabetic eye exam      Relevant Orders   Basic Metabolic Panel (BMET)   Hemoglobin  A1c     Other   Mixed hyperlipidemia    On simvastatin Check lipid profile      Relevant Medications   hydrALAZINE (APRESOLINE) 25 MG tablet   Other Relevant Orders   Lipid Profile    Meds ordered this encounter  Medications   hydrALAZINE (APRESOLINE) 25 MG tablet    Sig: Take 1 tablet (25 mg total) by mouth 3 (three) times daily.    Dispense:  90 tablet    Refill:  1    PLEASE DISCONTINUE CLONIDINE    Follow-up: Return in about 6 months (around 03/02/2023) for HTN.    Lindell Spar, MD

## 2022-09-02 ENCOUNTER — Inpatient Hospital Stay: Admission: RE | Admit: 2022-09-02 | Payer: Medicare HMO | Source: Ambulatory Visit

## 2022-09-15 ENCOUNTER — Other Ambulatory Visit: Payer: Self-pay

## 2022-09-15 DIAGNOSIS — R42 Dizziness and giddiness: Secondary | ICD-10-CM

## 2022-09-15 DIAGNOSIS — I10 Essential (primary) hypertension: Secondary | ICD-10-CM

## 2022-09-15 DIAGNOSIS — E1165 Type 2 diabetes mellitus with hyperglycemia: Secondary | ICD-10-CM

## 2022-09-15 DIAGNOSIS — E785 Hyperlipidemia, unspecified: Secondary | ICD-10-CM

## 2022-09-15 DIAGNOSIS — E119 Type 2 diabetes mellitus without complications: Secondary | ICD-10-CM

## 2022-09-15 MED ORDER — METFORMIN HCL ER 500 MG PO TB24: ORAL_TABLET | ORAL | 1 refills | Status: DC

## 2022-09-15 MED ORDER — MECLIZINE HCL 25 MG PO TABS: ORAL_TABLET | ORAL | 0 refills | Status: DC

## 2022-09-15 MED ORDER — IRBESARTAN 300 MG PO TABS: 300.0000 mg | ORAL_TABLET | Freq: Every day | ORAL | 3 refills | Status: DC

## 2022-09-15 MED ORDER — SIMVASTATIN 20 MG PO TABS: ORAL_TABLET | ORAL | 0 refills | Status: DC

## 2022-09-15 MED ORDER — GLIPIZIDE 10 MG PO TABS: ORAL_TABLET | ORAL | 1 refills | Status: DC

## 2022-09-17 DIAGNOSIS — E1169 Type 2 diabetes mellitus with other specified complication: Secondary | ICD-10-CM | POA: Diagnosis not present

## 2022-09-17 DIAGNOSIS — E782 Mixed hyperlipidemia: Secondary | ICD-10-CM | POA: Diagnosis not present

## 2022-09-18 LAB — BASIC METABOLIC PANEL
BUN/Creatinine Ratio: 16 (ref 12–28)
BUN: 18 mg/dL (ref 8–27)
CO2: 20 mmol/L (ref 20–29)
Calcium: 9.2 mg/dL (ref 8.7–10.3)
Chloride: 106 mmol/L (ref 96–106)
Creatinine, Ser: 1.12 mg/dL — ABNORMAL HIGH (ref 0.57–1.00)
Glucose: 155 mg/dL — ABNORMAL HIGH (ref 70–99)
Potassium: 3.9 mmol/L (ref 3.5–5.2)
Sodium: 143 mmol/L (ref 134–144)
eGFR: 49 mL/min/{1.73_m2} — ABNORMAL LOW (ref >59)

## 2022-09-18 LAB — LIPID PANEL
Chol/HDL Ratio: 2.7 ratio (ref 0.0–4.4)
Cholesterol, Total: 166 mg/dL (ref 100–199)
HDL: 61 mg/dL (ref >39)
LDL Chol Calc (NIH): 89 mg/dL (ref 0–99)
Triglycerides: 86 mg/dL (ref 0–149)
VLDL Cholesterol Cal: 16 mg/dL (ref 5–40)

## 2022-09-18 LAB — HEMOGLOBIN A1C
Est. average glucose Bld gHb Est-mCnc: 160 mg/dL
Hgb A1c MFr Bld: 7.2 % — ABNORMAL HIGH (ref 4.8–5.6)

## 2022-09-21 ENCOUNTER — Other Ambulatory Visit: Payer: Self-pay

## 2022-09-21 DIAGNOSIS — I480 Paroxysmal atrial fibrillation: Secondary | ICD-10-CM

## 2022-09-21 MED ORDER — RIVAROXABAN 20 MG PO TABS
ORAL_TABLET | ORAL | 1 refills | Status: DC
Start: 2022-09-21 — End: 2023-03-09

## 2022-09-29 ENCOUNTER — Encounter: Payer: Self-pay | Admitting: Internal Medicine

## 2022-09-29 ENCOUNTER — Ambulatory Visit (INDEPENDENT_AMBULATORY_CARE_PROVIDER_SITE_OTHER): Payer: Medicare PPO | Admitting: Internal Medicine

## 2022-09-29 VITALS — BP 120/78 | HR 73 | Ht 63.0 in | Wt 186.0 lb

## 2022-09-29 DIAGNOSIS — M48061 Spinal stenosis, lumbar region without neurogenic claudication: Secondary | ICD-10-CM | POA: Insufficient documentation

## 2022-09-29 DIAGNOSIS — I10 Essential (primary) hypertension: Secondary | ICD-10-CM | POA: Diagnosis not present

## 2022-09-29 DIAGNOSIS — M545 Low back pain, unspecified: Secondary | ICD-10-CM | POA: Diagnosis not present

## 2022-09-29 DIAGNOSIS — N1831 Chronic kidney disease, stage 3a: Secondary | ICD-10-CM | POA: Diagnosis not present

## 2022-09-29 DIAGNOSIS — E1169 Type 2 diabetes mellitus with other specified complication: Secondary | ICD-10-CM

## 2022-09-29 DIAGNOSIS — G8929 Other chronic pain: Secondary | ICD-10-CM | POA: Diagnosis not present

## 2022-09-29 DIAGNOSIS — E782 Mixed hyperlipidemia: Secondary | ICD-10-CM | POA: Diagnosis not present

## 2022-09-29 DIAGNOSIS — I480 Paroxysmal atrial fibrillation: Secondary | ICD-10-CM

## 2022-09-29 DIAGNOSIS — N1832 Chronic kidney disease, stage 3b: Secondary | ICD-10-CM | POA: Insufficient documentation

## 2022-09-29 MED ORDER — CYCLOBENZAPRINE HCL 5 MG PO TABS
5.0000 mg | ORAL_TABLET | Freq: Two times a day (BID) | ORAL | 1 refills | Status: DC | PRN
Start: 2022-09-29 — End: 2023-10-14

## 2022-09-29 MED ORDER — HYDRALAZINE HCL 25 MG PO TABS: 25.0000 mg | ORAL_TABLET | Freq: Three times a day (TID) | ORAL | 1 refills | Status: DC

## 2022-09-29 NOTE — Assessment & Plan Note (Signed)
Last BMP reviewed, GFR stable at 49 Avoid nephrotoxic agents On ARB Check urine microalbumin/creatinine ratio

## 2022-09-29 NOTE — Assessment & Plan Note (Signed)
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

## 2022-09-29 NOTE — Assessment & Plan Note (Addendum)
On simvastatin Checked lipid profile

## 2022-09-29 NOTE — Progress Notes (Signed)
Established Patient Office Visit  Subjective:  Patient ID: Catherine Joseph, female    DOB: 1940-06-29  Age: 82 y.o. MRN: [id number]  CC:  Chief Complaint  Patient presents with   Hypertension    Six week hypertension. Patient says she has right sided flank pain    HPI Catherine Joseph is a 82 y.o. female with past medical history of atrial fibrillation, HTN, type II DM, HLD and obesity who presents for f/u of her chronic medical conditions.  HTN and atrial fibrillation:  She is currently taking irbesartan 300 mg QD and Hydralazine 25 mg TID. She is taking flecainide and Xarelto for A-fib.  She is followed by Dr. Eden EmmsNishan for it.  She denies any headache, dizziness, chest pain, dyspnea or, palpitations.  Previous history: She had an urgent care visit for elevated BP, and was later seen by Dr. Barbaraann FasterSteen.  She was advised to take clonidine twice daily instead of QD.  Her blood pressure had improved, but still was elevated.   Type II DM: She is on metformin and glipizide currently.  Her HbA1c has increased to 7.2 now.  Of note, she has been taking glipizide only once daily instead of twice daily.  Denies any polyuria or polyphagia currently.  She is also on simvastatin for HLD.  Her last BMP showed GFR around 49.  She currently denies any dysuria, hematuria or urinary hesitancy or resistance.  She reports right-sided mid back pain, which is chronic, worse since 03/10, constant, worse with movement.  Denies any recent injury or fall.  She thought it was due to diverticulitis as she has history of diverticulitis.  She denies any fever, chills, abdominal pain, vomiting, constipation or diarrhea.  Denies any melena or hematochezia.  Past Medical History:  Diagnosis Date   Anemia    Arthritis    Bradycardia    Cataract    Chronic kidney disease    Diabetes mellitus type II    Hypertension    Osteoporosis    Syncope    Weakness     Past Surgical History:  Procedure Laterality Date    ABDOMINAL HYSTERECTOMY     CESAREAN SECTION     COLON SURGERY     EYE SURGERY     FRACTURE SURGERY      Family History  Problem Relation Age of Onset   Diabetes Sister    Hyperlipidemia Sister    Hypertension Sister    Diabetes Brother    Hyperlipidemia Brother    Hypertension Brother     Social History   Socioeconomic History   Marital status: Widowed    Spouse name: Not on file   Number of children: Not on file   Years of education: Not on file   Highest education level: Not on file  Occupational History   Not on file  Tobacco Use   Smoking status: Never   Smokeless tobacco: Never  Vaping Use   Vaping Use: Never used  Substance and Sexual Activity   Alcohol use: No   Drug use: No   Sexual activity: Not Currently  Other Topics Concern   Not on file  Social History Narrative   ambudexterty   No caffeine   Two story home    Social Determinants of Health   Financial Resource Strain: Not on file  Food Insecurity: Not on file  Transportation Needs: Not on file  Physical Activity: Not on file  Stress: Not on file  Social Connections: Not on file  Intimate Partner Violence: Not on file    Outpatient Medications Prior to Visit  Medication Sig Dispense Refill   ACCU-CHEK AVIVA PLUS test strip TEST BLOOD SUGAR ONE TIME DAILY 100 strip 1   Accu-Chek Softclix Lancets lancets TEST BLOOD SUGAR ONE TIME DAILY 100 each 3   acetaminophen (TYLENOL) 500 MG tablet Take 1 tablet (500 mg total) by mouth every 6 (six) hours as needed. 30 tablet 0   Alcohol Swabs (B-D SINGLE USE SWABS REGULAR) PADS USE TO CHECK BLOOD SUGAR DAILY 100 each 3   Blood Glucose Calibration (SURECHEK CONTROL SOLUTION) Normal LIQD Use to check blood sugar daily. Dx E11.65 1 each 3   Blood Glucose Monitoring Suppl (ACCU-CHEK AVIVA PLUS) w/Device KIT USE AS DIRECTED 1 kit 5   diclofenac Sodium (VOLTAREN) 1 % GEL Apply 2 g topically 4 (four) times daily as needed. 50 g 0   flecainide (TAMBOCOR) 100 MG  tablet TAKE 1/2 TABLET TWICE DAILY (PLEASE KEEP UPCOMING APPT FOR FUTURE REFILLS) 90 tablet 2   fluticasone (FLONASE) 50 MCG/ACT nasal spray Place 2 sprays into both nostrils daily. 16 g 0   glipiZIDE (GLUCOTROL) 10 MG tablet TAKE 1 TABLET TWICE DAILY BEFORE MEALS 180 tablet 1   irbesartan (AVAPRO) 300 MG tablet Take 1 tablet (300 mg total) by mouth daily. 90 tablet 3   meclizine (ANTIVERT) 25 MG tablet TAKE 1 TABLET THREE TIMES DAILY AS NEEDED FOR DIZZINESS 90 tablet 0   metFORMIN (GLUCOPHAGE-XR) 500 MG 24 hr tablet TAKE 2 TABLETS DAILY WITH BREAKFAST 180 tablet 1   polyethylene glycol powder (GLYCOLAX/MIRALAX) 17 GM/SCOOP powder Take 17 g by mouth 2 (two) times daily as needed. 255 g 3   polyethylene glycol powder (GLYCOLAX/MIRALAX) powder Take 17 g by mouth 2 (two) times daily as needed. 500 g 1   rivaroxaban (XARELTO) 20 MG TABS tablet TAKE 1 TABLET(20 MG) BY MOUTH DAILY WITH SUPPER 90 tablet 1   simvastatin (ZOCOR) 20 MG tablet TAKE 1 TABLET EVERY DAY AT 6:00PM (NEEDS APPT) 90 tablet 0   hydrALAZINE (APRESOLINE) 25 MG tablet Take 1 tablet (25 mg total) by mouth 3 (three) times daily. 90 tablet 1   No facility-administered medications prior to visit.    Allergies  Allergen Reactions   Codeine Other (See Comments) and Rash    vertigo vertigo     ROS Review of Systems  Constitutional:  Negative for chills and fever.  HENT:  Negative for congestion, sinus pressure, sinus pain and sore throat.   Eyes:  Negative for pain and discharge.  Respiratory:  Negative for cough and shortness of breath.   Cardiovascular:  Negative for chest pain and palpitations.  Gastrointestinal:  Negative for abdominal pain, constipation, diarrhea, nausea and vomiting.  Endocrine: Negative for polydipsia and polyuria.  Genitourinary:  Negative for dysuria and hematuria.  Musculoskeletal:  Positive for back pain. Negative for neck pain and neck stiffness.  Skin:  Negative for rash.  Neurological:  Negative  for dizziness and weakness.  Psychiatric/Behavioral:  Negative for agitation and behavioral problems.       Objective:    Physical Exam Vitals reviewed.  Constitutional:      General: She is not in acute distress.    Appearance: She is obese. She is not diaphoretic.  HENT:     Head: Normocephalic and atraumatic.     Nose: Nose normal.     Mouth/Throat:     Mouth: Mucous membranes are moist.  Eyes:     General:  No scleral icterus.    Extraocular Movements: Extraocular movements intact.  Cardiovascular:     Rate and Rhythm: Normal rate. Rhythm irregular.     Heart sounds: Normal heart sounds. No murmur heard. Pulmonary:     Breath sounds: Normal breath sounds. No wheezing or rales.  Abdominal:     General: Bowel sounds are normal.     Palpations: Abdomen is soft.     Tenderness: There is no abdominal tenderness. There is no guarding or rebound.  Musculoskeletal:     Cervical back: Neck supple. No tenderness.     Lumbar back: Tenderness present.     Right lower leg: No edema.     Left lower leg: No edema.  Skin:    General: Skin is warm.     Findings: No rash.  Neurological:     General: No focal deficit present.     Mental Status: She is alert and oriented to person, place, and time.     Sensory: No sensory deficit.     Motor: No weakness.  Psychiatric:        Mood and Affect: Mood normal.        Behavior: Behavior normal.     BP 120/78 (BP Location: Right Arm, Patient Position: Sitting, Cuff Size: Normal)   Pulse 73   Ht 5\' 3"  (1.6 m)   Wt 186 lb (84.4 kg)   SpO2 95%   BMI 32.95 kg/m  Wt Readings from Last 3 Encounters:  09/29/22 186 lb (84.4 kg)  08/30/22 188 lb (85.3 kg)  07/05/22 189 lb (85.7 kg)    Lab Results  Component Value Date   TSH 1.510 11/22/2018   Lab Results  Component Value Date   WBC 4.0 05/26/2022   HGB 13.9 05/26/2022   HCT 42.6 05/26/2022   MCV 86 05/26/2022   PLT 183 05/26/2022   Lab Results  Component Value Date   NA 143  09/17/2022   K 3.9 09/17/2022   CO2 20 09/17/2022   GLUCOSE 155 (H) 09/17/2022   BUN 18 09/17/2022   CREATININE 1.12 (H) 09/17/2022   BILITOT 0.7 05/26/2022   ALKPHOS 110 05/26/2022   AST 19 05/26/2022   ALT 11 05/26/2022   PROT 8.0 05/26/2022   ALBUMIN 4.4 05/26/2022   CALCIUM 9.2 09/17/2022   ANIONGAP 9 07/18/2020   EGFR 49 (L) 09/17/2022   GFR 63.85 03/17/2015   Lab Results  Component Value Date   CHOL 166 09/17/2022   Lab Results  Component Value Date   HDL 61 09/17/2022   Lab Results  Component Value Date   LDLCALC 89 09/17/2022   Lab Results  Component Value Date   TRIG 86 09/17/2022   Lab Results  Component Value Date   CHOLHDL 2.7 09/17/2022   Lab Results  Component Value Date   HGBA1C 7.2 (H) 09/17/2022      Assessment & Plan:   Problem List Items Addressed This Visit       Cardiovascular and Mediastinum   Hypertension - Primary    BP Readings from Last 1 Encounters:  09/29/22 120/78  Well-controlled with irbesartan 300 mg QD and hydralazine 25 mg TID DCed Clonidine as it can cause reflex tachycardia, she already has history of A-fib, would avoid clonidine if possible Counseled for compliance with the medications Advised DASH diet and moderate exercise/walking as tolerated      Relevant Medications   hydrALAZINE (APRESOLINE) 25 MG tablet   Atrial fibrillation    On  flecainide and Xarelto Followed by cardiology      Relevant Medications   hydrALAZINE (APRESOLINE) 25 MG tablet     Endocrine   Type 2 diabetes mellitus with other specified complication    Lab Results  Component Value Date   HGBA1C 7.2 (H) 09/17/2022   Uncontrolled Associated with HTN and HLD On metformin and glipizide - needs to take it BID Advised to follow diabetic diet On statin and ARB F/u CMP and lipid panel Diabetic eye exam: Advised to follow up with Ophthalmology for diabetic eye exam      Relevant Orders   Basic Metabolic Panel (BMET)   Hemoglobin A1c    Urine Microalbumin w/creat. ratio     Genitourinary   Stage 3a chronic kidney disease    Last BMP reviewed, GFR stable at 49 Avoid nephrotoxic agents On ARB Check urine microalbumin/creatinine ratio        Other   Mixed hyperlipidemia    On simvastatin Checked lipid profile      Relevant Medications   hydrALAZINE (APRESOLINE) 25 MG tablet   Chronic right-sided low back pain without sciatica    Her flank pain/back pain is likely due to DDD of lumbar spine and/or muscular strain Unlikely due to diverticulitis due to chronicity and location of her pain She also denies dysuria, hematuria or urinary hesitancy or resistance Tylenol arthritis as needed for pain Flexeril as needed for back muscle spasms Avoid heavy lifting and frequent bending       Relevant Medications   cyclobenzaprine (FLEXERIL) 5 MG tablet    Meds ordered this encounter  Medications   cyclobenzaprine (FLEXERIL) 5 MG tablet    Sig: Take 1 tablet (5 mg total) by mouth 2 (two) times daily as needed for muscle spasms.    Dispense:  30 tablet    Refill:  1   hydrALAZINE (APRESOLINE) 25 MG tablet    Sig: Take 1 tablet (25 mg total) by mouth 3 (three) times daily.    Dispense:  270 tablet    Refill:  1    PLEASE DISCONTINUE CLONIDINE    Follow-up: Return in about 4 months (around 01/29/2023) for DM and CKD.    Anabel Halon, MD

## 2022-09-29 NOTE — Assessment & Plan Note (Signed)
On flecainide and Xarelto Followed by cardiology 

## 2022-09-29 NOTE — Assessment & Plan Note (Signed)
BP Readings from Last 1 Encounters:  09/29/22 120/78   Well-controlled with irbesartan 300 mg QD and hydralazine 25 mg TID DCed Clonidine as it can cause reflex tachycardia, she already has history of A-fib, would avoid clonidine if possible Counseled for compliance with the medications Advised DASH diet and moderate exercise/walking as tolerated

## 2022-09-29 NOTE — Patient Instructions (Addendum)
Please take Flexeril as needed for back pain.  Please continue to take Tylenol arthritis for back pain.  Please take Metformin and Glipizide twice daily.  Please continue to take other medications as prescribed.  Please continue to follow low carb diet and perform moderate exercise/walking at least 150 mins/week.

## 2022-09-29 NOTE — Assessment & Plan Note (Addendum)
Lab Results  Component Value Date   HGBA1C 7.2 (H) 09/17/2022   Uncontrolled Associated with HTN and HLD On metformin and glipizide - needs to take it BID Advised to follow diabetic diet On statin and ARB F/u CMP and lipid panel Diabetic eye exam: Advised to follow up with Ophthalmology for diabetic eye exam

## 2022-10-06 ENCOUNTER — Other Ambulatory Visit: Payer: Self-pay | Admitting: Internal Medicine

## 2022-10-06 DIAGNOSIS — R42 Dizziness and giddiness: Secondary | ICD-10-CM

## 2022-10-11 ENCOUNTER — Ambulatory Visit: Payer: Medicare PPO | Admitting: Internal Medicine

## 2022-10-11 DIAGNOSIS — E119 Type 2 diabetes mellitus without complications: Secondary | ICD-10-CM | POA: Diagnosis not present

## 2022-10-11 LAB — HM DIABETES EYE EXAM

## 2022-11-01 DIAGNOSIS — D4102 Neoplasm of uncertain behavior of left kidney: Secondary | ICD-10-CM | POA: Diagnosis not present

## 2022-11-04 DIAGNOSIS — C642 Malignant neoplasm of left kidney, except renal pelvis: Secondary | ICD-10-CM | POA: Diagnosis not present

## 2022-11-04 DIAGNOSIS — N281 Cyst of kidney, acquired: Secondary | ICD-10-CM | POA: Diagnosis not present

## 2022-11-04 DIAGNOSIS — D49512 Neoplasm of unspecified behavior of left kidney: Secondary | ICD-10-CM | POA: Diagnosis not present

## 2022-11-05 ENCOUNTER — Other Ambulatory Visit: Payer: Self-pay | Admitting: Cardiovascular Disease

## 2022-11-05 DIAGNOSIS — I480 Paroxysmal atrial fibrillation: Secondary | ICD-10-CM

## 2022-11-23 DIAGNOSIS — D49512 Neoplasm of unspecified behavior of left kidney: Secondary | ICD-10-CM | POA: Diagnosis not present

## 2022-11-23 DIAGNOSIS — R3129 Other microscopic hematuria: Secondary | ICD-10-CM | POA: Diagnosis not present

## 2023-01-24 ENCOUNTER — Other Ambulatory Visit: Payer: Self-pay | Admitting: Internal Medicine

## 2023-01-24 ENCOUNTER — Other Ambulatory Visit: Payer: Self-pay | Admitting: Cardiovascular Disease

## 2023-01-24 DIAGNOSIS — R42 Dizziness and giddiness: Secondary | ICD-10-CM

## 2023-01-24 DIAGNOSIS — I480 Paroxysmal atrial fibrillation: Secondary | ICD-10-CM

## 2023-01-25 NOTE — Telephone Encounter (Signed)
This is a [place] pt.  °

## 2023-01-31 ENCOUNTER — Ambulatory Visit: Payer: Medicare HMO | Admitting: Internal Medicine

## 2023-01-31 ENCOUNTER — Encounter: Payer: Self-pay | Admitting: Internal Medicine

## 2023-01-31 VITALS — BP 158/70 | HR 70 | Ht 63.0 in | Wt 190.8 lb

## 2023-01-31 DIAGNOSIS — Z7984 Long term (current) use of oral hypoglycemic drugs: Secondary | ICD-10-CM | POA: Diagnosis not present

## 2023-01-31 DIAGNOSIS — E782 Mixed hyperlipidemia: Secondary | ICD-10-CM | POA: Diagnosis not present

## 2023-01-31 DIAGNOSIS — N1831 Chronic kidney disease, stage 3a: Secondary | ICD-10-CM | POA: Diagnosis not present

## 2023-01-31 DIAGNOSIS — I10 Essential (primary) hypertension: Secondary | ICD-10-CM

## 2023-01-31 DIAGNOSIS — I480 Paroxysmal atrial fibrillation: Secondary | ICD-10-CM

## 2023-01-31 DIAGNOSIS — G8929 Other chronic pain: Secondary | ICD-10-CM

## 2023-01-31 DIAGNOSIS — E1169 Type 2 diabetes mellitus with other specified complication: Secondary | ICD-10-CM | POA: Diagnosis not present

## 2023-01-31 DIAGNOSIS — M545 Low back pain, unspecified: Secondary | ICD-10-CM

## 2023-01-31 MED ORDER — LIDOCAINE 5 % EX PTCH
1.0000 | MEDICATED_PATCH | CUTANEOUS | 0 refills | Status: DC
Start: 2023-01-31 — End: 2023-10-14

## 2023-01-31 MED ORDER — HYDRALAZINE HCL 50 MG PO TABS
50.0000 mg | ORAL_TABLET | Freq: Three times a day (TID) | ORAL | 1 refills | Status: DC
Start: 2023-01-31 — End: 2023-07-12

## 2023-01-31 NOTE — Assessment & Plan Note (Signed)
Last BMP reviewed, GFR stable at 49 Avoid nephrotoxic agents On ARB Checked urine microalbumin/creatinine ratio

## 2023-01-31 NOTE — Assessment & Plan Note (Signed)
On flecainide and Xarelto Followed by cardiology

## 2023-01-31 NOTE — Assessment & Plan Note (Addendum)
BP Readings from Last 1 Encounters:  01/31/23 (!) 158/70   Uncontrolled with irbesartan 300 mg QD and hydralazine 25 mg TID Increase dose of hydralazine to 50 mg 3 times daily DCed Clonidine in the past as it can cause reflex tachycardia, she already has history of A-fib, would avoid clonidine if possible Counseled for compliance with the medications Advised DASH diet and moderate exercise/walking as tolerated

## 2023-01-31 NOTE — Progress Notes (Signed)
Established Patient Office Visit  Subjective:  Patient ID: Catherine Joseph, female    DOB: October 14, 1940  Age: 82 y.o. MRN: [id number]  CC:  Chief Complaint  Patient presents with   Diabetes    Four months follow up    Chronic Kidney Disease    Four month follow up     HPI Catherine Joseph is a 82 y.o. female with past medical history of atrial fibrillation, HTN, type II DM, HLD and obesity who presents for f/u of her chronic medical conditions.  HTN and atrial fibrillation:  She is currently taking irbesartan 300 mg QD and Hydralazine 25 mg TID. She is taking flecainide and Xarelto for A-fib.  She is followed by Dr. Eden Emms for it.  She denies any headache, dizziness, chest pain, dyspnea or, palpitations.  Type II DM: She is on metformin and glipizide currently.  Her HbA1c has increased to 7.2 now.  Of note, she has been taking glipizide only once daily instead of twice daily.  Denies any polyuria or polyphagia currently.  She is also on simvastatin for HLD.  Her last BMP showed GFR around 49.  She currently denies any dysuria, hematuria or urinary hesitancy or resistance.  She reports right-sided mid back pain, which is chronic, worse since 08/29/22, intermittent, worse with movement.  Denies any recent injury or fall. She denies any fever, chills, abdominal pain, vomiting, constipation or diarrhea.  Denies any melena or hematochezia.  Past Medical History:  Diagnosis Date   Anemia    Arthritis    Bradycardia    Cataract    Chronic kidney disease    Diabetes mellitus type II    Hypertension    Osteoporosis    Syncope    Weakness     Past Surgical History:  Procedure Laterality Date   ABDOMINAL HYSTERECTOMY     CESAREAN SECTION     COLON SURGERY     EYE SURGERY     FRACTURE SURGERY      Family History  Problem Relation Age of Onset   Diabetes Sister    Hyperlipidemia Sister    Hypertension Sister    Diabetes Brother    Hyperlipidemia Brother     Hypertension Brother     Social History   Socioeconomic History   Marital status: Widowed    Spouse name: Not on file   Number of children: Not on file   Years of education: Not on file   Highest education level: Not on file  Occupational History   Not on file  Tobacco Use   Smoking status: Never   Smokeless tobacco: Never  Vaping Use   Vaping status: Never Used  Substance and Sexual Activity   Alcohol use: No   Drug use: No   Sexual activity: Not Currently  Other Topics Concern   Not on file  Social History Narrative   ambudexterty   No caffeine   Two story home    Social Determinants of Health   Financial Resource Strain: Not on file  Food Insecurity: Not on file  Transportation Needs: Not on file  Physical Activity: Not on file  Stress: Not on file  Social Connections: Not on file  Intimate Partner Violence: Not on file    Outpatient Medications Prior to Visit  Medication Sig Dispense Refill   ACCU-CHEK AVIVA PLUS test strip TEST BLOOD SUGAR ONE TIME DAILY 100 strip 1   Accu-Chek Softclix Lancets lancets TEST BLOOD SUGAR ONE TIME DAILY 100 each  3   acetaminophen (TYLENOL) 500 MG tablet Take 1 tablet (500 mg total) by mouth every 6 (six) hours as needed. 30 tablet 0   Alcohol Swabs (B-D SINGLE USE SWABS REGULAR) PADS USE TO CHECK BLOOD SUGAR DAILY 100 each 3   Blood Glucose Calibration (SURECHEK CONTROL SOLUTION) Normal LIQD Use to check blood sugar daily. Dx E11.65 1 each 3   Blood Glucose Monitoring Suppl (ACCU-CHEK AVIVA PLUS) w/Device KIT USE AS DIRECTED 1 kit 5   cyclobenzaprine (FLEXERIL) 5 MG tablet Take 1 tablet (5 mg total) by mouth 2 (two) times daily as needed for muscle spasms. 30 tablet 1   diclofenac Sodium (VOLTAREN) 1 % GEL Apply 2 g topically 4 (four) times daily as needed. 50 g 0   flecainide (TAMBOCOR) 100 MG tablet TAKE 1/2 TABLET TWICE DAILY (PLEASE KEEP UPCOMING APPT FOR FUTURE REFILLS) 90 tablet 1   fluticasone (FLONASE) 50 MCG/ACT nasal  spray Place 2 sprays into both nostrils daily. 16 g 0   glipiZIDE (GLUCOTROL) 10 MG tablet TAKE 1 TABLET TWICE DAILY BEFORE MEALS 180 tablet 1   irbesartan (AVAPRO) 300 MG tablet Take 1 tablet (300 mg total) by mouth daily. 90 tablet 3   meclizine (ANTIVERT) 25 MG tablet Take 1 tablet (25 mg total) by mouth 2 (two) times daily as needed for dizziness. 60 tablet 3   metFORMIN (GLUCOPHAGE-XR) 500 MG 24 hr tablet TAKE 2 TABLETS DAILY WITH BREAKFAST 180 tablet 1   polyethylene glycol powder (GLYCOLAX/MIRALAX) 17 GM/SCOOP powder Take 17 g by mouth 2 (two) times daily as needed. 255 g 3   polyethylene glycol powder (GLYCOLAX/MIRALAX) powder Take 17 g by mouth 2 (two) times daily as needed. 500 g 1   rivaroxaban (XARELTO) 20 MG TABS tablet TAKE 1 TABLET(20 MG) BY MOUTH DAILY WITH SUPPER 90 tablet 1   simvastatin (ZOCOR) 20 MG tablet TAKE 1 TABLET EVERY DAY AT 6:00PM (NEEDS APPT) 90 tablet 0   hydrALAZINE (APRESOLINE) 25 MG tablet Take 1 tablet (25 mg total) by mouth 3 (three) times daily. 270 tablet 1   No facility-administered medications prior to visit.    Allergies  Allergen Reactions   Codeine Other (See Comments) and Rash    vertigo vertigo     ROS Review of Systems  Constitutional:  Negative for chills and fever.  HENT:  Negative for congestion, sinus pressure, sinus pain and sore throat.   Eyes:  Negative for pain and discharge.  Respiratory:  Negative for cough and shortness of breath.   Cardiovascular:  Negative for chest pain and palpitations.  Gastrointestinal:  Negative for abdominal pain, constipation, diarrhea, nausea and vomiting.  Endocrine: Negative for polydipsia and polyuria.  Genitourinary:  Negative for dysuria and hematuria.  Musculoskeletal:  Positive for back pain. Negative for neck pain and neck stiffness.  Skin:  Negative for rash.  Neurological:  Negative for dizziness and weakness.  Psychiatric/Behavioral:  Negative for agitation and behavioral problems.        Objective:    Physical Exam Vitals reviewed.  Constitutional:      General: She is not in acute distress.    Appearance: She is obese. She is not diaphoretic.  HENT:     Head: Normocephalic and atraumatic.     Nose: Nose normal.     Mouth/Throat:     Mouth: Mucous membranes are moist.  Eyes:     General: No scleral icterus.    Extraocular Movements: Extraocular movements intact.  Cardiovascular:  Rate and Rhythm: Normal rate. Rhythm irregular.     Heart sounds: Normal heart sounds. No murmur heard. Pulmonary:     Breath sounds: Normal breath sounds. No wheezing or rales.  Abdominal:     General: Bowel sounds are normal.     Palpations: Abdomen is soft.     Tenderness: There is no abdominal tenderness. There is no guarding or rebound.  Musculoskeletal:     Cervical back: Neck supple. No tenderness.     Lumbar back: Tenderness present.     Right lower leg: No edema.     Left lower leg: No edema.  Skin:    General: Skin is warm.     Findings: No rash.  Neurological:     General: No focal deficit present.     Mental Status: She is alert and oriented to person, place, and time.     Sensory: No sensory deficit.     Motor: No weakness.  Psychiatric:        Mood and Affect: Mood normal.        Behavior: Behavior normal.     BP (!) 158/70 (BP Location: Left Arm)   Pulse 70   Ht 5\' 3"  (1.6 m)   Wt 190 lb 12.8 oz (86.5 kg)   SpO2 94%   BMI 33.80 kg/m  Wt Readings from Last 3 Encounters:  01/31/23 190 lb 12.8 oz (86.5 kg)  09/29/22 186 lb (84.4 kg)  08/30/22 188 lb (85.3 kg)    Lab Results  Component Value Date   TSH 1.510 11/22/2018   Lab Results  Component Value Date   WBC 4.0 05/26/2022   HGB 13.9 05/26/2022   HCT 42.6 05/26/2022   MCV 86 05/26/2022   PLT 183 05/26/2022   Lab Results  Component Value Date   NA 141 01/31/2023   K 4.9 01/31/2023   CO2 24 01/31/2023   GLUCOSE 137 (H) 01/31/2023   BUN 16 01/31/2023   CREATININE 1.04 (H)  01/31/2023   BILITOT 0.4 01/31/2023   ALKPHOS 100 01/31/2023   AST 27 01/31/2023   ALT 11 01/31/2023   PROT 7.7 01/31/2023   ALBUMIN 4.0 01/31/2023   CALCIUM 8.8 01/31/2023   ANIONGAP 9 07/18/2020   EGFR 53 (L) 01/31/2023   GFR 63.85 03/17/2015   Lab Results  Component Value Date   CHOL 166 09/17/2022   Lab Results  Component Value Date   HDL 61 09/17/2022   Lab Results  Component Value Date   LDLCALC 89 09/17/2022   Lab Results  Component Value Date   TRIG 86 09/17/2022   Lab Results  Component Value Date   CHOLHDL 2.7 09/17/2022   Lab Results  Component Value Date   HGBA1C 6.6 (H) 01/31/2023      Assessment & Plan:   Problem List Items Addressed This Visit       Cardiovascular and Mediastinum   Hypertension    BP Readings from Last 1 Encounters:  01/31/23 (!) 158/70   Uncontrolled with irbesartan 300 mg QD and hydralazine 25 mg TID Increase dose of hydralazine to 50 mg 3 times daily DCed Clonidine in the past as it can cause reflex tachycardia, she already has history of A-fib, would avoid clonidine if possible Counseled for compliance with the medications Advised DASH diet and moderate exercise/walking as tolerated      Relevant Medications   hydrALAZINE (APRESOLINE) 50 MG tablet   Atrial fibrillation (HCC) - Primary    On flecainide and  Xarelto Followed by cardiology      Relevant Medications   hydrALAZINE (APRESOLINE) 50 MG tablet     Endocrine   Type 2 diabetes mellitus with other specified complication (HCC)    Lab Results  Component Value Date   HGBA1C 6.6 (H) 01/31/2023   Well-controlled Associated with HTN and HLD On metformin and glipizide - needs to continue to take it BID Advised to follow diabetic diet On statin and ARB F/u CMP and lipid panel Diabetic eye exam: Advised to follow up with Ophthalmology for diabetic eye exam      Relevant Orders   CMP14+EGFR (Completed)   Hemoglobin A1c (Completed)     Genitourinary    Stage 3a chronic kidney disease (HCC)    Last BMP reviewed, GFR stable at 49 Avoid nephrotoxic agents On ARB Checked urine microalbumin/creatinine ratio        Other   Mixed hyperlipidemia    On simvastatin Checked lipid profile      Relevant Medications   hydrALAZINE (APRESOLINE) 50 MG tablet   Chronic right-sided low back pain without sciatica    Her flank pain/back pain is likely due to DDD of lumbar spine and/or muscular strain Tylenol arthritis as needed for pain Flexeril as needed for back muscle spasms Avoid heavy lifting and frequent bending      Relevant Medications   lidocaine (LIDODERM) 5 %     Meds ordered this encounter  Medications   hydrALAZINE (APRESOLINE) 50 MG tablet    Sig: Take 1 tablet (50 mg total) by mouth 3 (three) times daily.    Dispense:  270 tablet    Refill:  1    Dose change - 01/31/23   lidocaine (LIDODERM) 5 %    Sig: Place 1 patch onto the skin daily. Remove & Discard patch within 12 hours or as directed by MD    Dispense:  30 patch    Refill:  0    Follow-up: Return in about 2 months (around 04/02/2023) for Annual physical.    Anabel Halon, MD

## 2023-01-31 NOTE — Assessment & Plan Note (Addendum)
Lab Results  Component Value Date   HGBA1C 6.6 (H) 01/31/2023   Well-controlled Associated with HTN and HLD On metformin and glipizide - needs to continue to take it BID Advised to follow diabetic diet On statin and ARB F/u CMP and lipid panel Diabetic eye exam: Advised to follow up with Ophthalmology for diabetic eye exam

## 2023-01-31 NOTE — Patient Instructions (Addendum)
Please start taking Hydralazine 50 mg 3 times as daily instead of 25 mg.  Please apply Lidocaine patch over back area. Okay to apply heating pad and/or back brace for back pain.  Please continue to follow low carb diet and ambulate as tolerated.  Please consider getting Shingrix and Tdap vaccine at local pharmacy.

## 2023-01-31 NOTE — Assessment & Plan Note (Signed)
On simvastatin Checked lipid profile

## 2023-02-01 ENCOUNTER — Encounter: Payer: Self-pay | Admitting: Internal Medicine

## 2023-02-01 LAB — CMP14+EGFR
ALT: 11 [IU]/L (ref 0–32)
AST: 27 [IU]/L (ref 0–40)
Albumin: 4 g/dL (ref 3.7–4.7)
Alkaline Phosphatase: 100 [IU]/L (ref 44–121)
BUN/Creatinine Ratio: 15 (ref 12–28)
BUN: 16 mg/dL (ref 8–27)
Bilirubin Total: 0.4 mg/dL (ref 0.0–1.2)
CO2: 24 mmol/L (ref 20–29)
Calcium: 8.8 mg/dL (ref 8.7–10.3)
Chloride: 103 mmol/L (ref 96–106)
Creatinine, Ser: 1.04 mg/dL — ABNORMAL HIGH (ref 0.57–1.00)
Globulin, Total: 3.7 g/dL (ref 1.5–4.5)
Glucose: 137 mg/dL — ABNORMAL HIGH (ref 70–99)
Potassium: 4.9 mmol/L (ref 3.5–5.2)
Sodium: 141 mmol/L (ref 134–144)
Total Protein: 7.7 g/dL (ref 6.0–8.5)
eGFR: 53 mL/min/{1.73_m2} — ABNORMAL LOW

## 2023-02-01 LAB — HEMOGLOBIN A1C
Est. average glucose Bld gHb Est-mCnc: 143 mg/dL
Hgb A1c MFr Bld: 6.6 % — ABNORMAL HIGH (ref 4.8–5.6)

## 2023-02-04 NOTE — Assessment & Plan Note (Signed)
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

## 2023-03-09 ENCOUNTER — Telehealth: Payer: Self-pay | Admitting: Cardiovascular Disease

## 2023-03-09 DIAGNOSIS — I480 Paroxysmal atrial fibrillation: Secondary | ICD-10-CM

## 2023-03-09 MED ORDER — RIVAROXABAN 20 MG PO TABS
ORAL_TABLET | ORAL | 1 refills | Status: DC
Start: 2023-03-09 — End: 2023-10-10

## 2023-03-09 NOTE — Telephone Encounter (Signed)
Prescription refill request for Xarelto received.  Indication: Afib  Last office visit: 04/09/22 Eden Emms)  Weight: 86.5kg Age: 82 Scr: 1.04 (01/31/23)  CrCl: 54.62ml/min  Appropriate dose. Refill sent.

## 2023-03-09 NOTE — Telephone Encounter (Signed)
*  STAT* If patient is at the pharmacy, call can be transferred to refill team.   1. Which medications need to be refilled? (please list name of each medication and dose if known)   rivaroxaban (XARELTO) 20 MG TABS tablet     2. Would you like to learn more about the convenience, safety, & potential cost savings by using the [facility]? No   3. Are you open to using the [facility] (Type [facility].) No   4. Which pharmacy/location (including street and city if local pharmacy) is medication to be sent to?  [facility] - [place], [place] - [address]     5. Do they need a 30 day or 90 day supply? 90 day

## 2023-03-10 ENCOUNTER — Other Ambulatory Visit: Payer: Self-pay | Admitting: Internal Medicine

## 2023-03-10 DIAGNOSIS — E1165 Type 2 diabetes mellitus with hyperglycemia: Secondary | ICD-10-CM

## 2023-03-10 DIAGNOSIS — E119 Type 2 diabetes mellitus without complications: Secondary | ICD-10-CM

## 2023-03-16 ENCOUNTER — Other Ambulatory Visit: Payer: Self-pay

## 2023-03-16 DIAGNOSIS — E119 Type 2 diabetes mellitus without complications: Secondary | ICD-10-CM

## 2023-03-30 ENCOUNTER — Other Ambulatory Visit: Payer: Self-pay | Admitting: Internal Medicine

## 2023-03-30 DIAGNOSIS — L304 Erythema intertrigo: Secondary | ICD-10-CM

## 2023-03-30 MED ORDER — NYSTATIN 100000 UNIT/GM EX CREA
1.0000 | TOPICAL_CREAM | Freq: Two times a day (BID) | CUTANEOUS | 1 refills | Status: DC
Start: 2023-03-30 — End: 2023-05-31

## 2023-04-06 ENCOUNTER — Ambulatory Visit (INDEPENDENT_AMBULATORY_CARE_PROVIDER_SITE_OTHER): Payer: Medicare HMO | Admitting: Internal Medicine

## 2023-04-06 ENCOUNTER — Encounter: Payer: Self-pay | Admitting: Internal Medicine

## 2023-04-06 VITALS — BP 134/62 | HR 90 | Ht 62.0 in | Wt 186.2 lb

## 2023-04-06 DIAGNOSIS — I480 Paroxysmal atrial fibrillation: Secondary | ICD-10-CM | POA: Diagnosis not present

## 2023-04-06 DIAGNOSIS — Z7984 Long term (current) use of oral hypoglycemic drugs: Secondary | ICD-10-CM | POA: Diagnosis not present

## 2023-04-06 DIAGNOSIS — K219 Gastro-esophageal reflux disease without esophagitis: Secondary | ICD-10-CM | POA: Diagnosis not present

## 2023-04-06 DIAGNOSIS — I10 Essential (primary) hypertension: Secondary | ICD-10-CM

## 2023-04-06 DIAGNOSIS — E1169 Type 2 diabetes mellitus with other specified complication: Secondary | ICD-10-CM

## 2023-04-06 DIAGNOSIS — E782 Mixed hyperlipidemia: Secondary | ICD-10-CM

## 2023-04-06 DIAGNOSIS — Z0001 Encounter for general adult medical examination with abnormal findings: Secondary | ICD-10-CM

## 2023-04-06 DIAGNOSIS — N1831 Chronic kidney disease, stage 3a: Secondary | ICD-10-CM | POA: Diagnosis not present

## 2023-04-06 DIAGNOSIS — Z23 Encounter for immunization: Secondary | ICD-10-CM | POA: Diagnosis not present

## 2023-04-06 MED ORDER — FAMOTIDINE 40 MG PO TABS
40.0000 mg | ORAL_TABLET | Freq: Every day | ORAL | 1 refills | Status: DC
Start: 2023-04-06 — End: 2023-10-14

## 2023-04-06 MED ORDER — SIMVASTATIN 20 MG PO TABS
20.0000 mg | ORAL_TABLET | Freq: Every day | ORAL | 3 refills | Status: DC
Start: 2023-04-06 — End: 2024-02-08

## 2023-04-06 NOTE — Patient Instructions (Signed)
Please start taking Famotidine as prescribed for stomach pain.  Please continue to take medications as prescribed.  Please continue to follow low carb diet and perform moderate exercise/walking as tolerated.  Please get fasting blood tests done before the next visit.

## 2023-04-06 NOTE — Assessment & Plan Note (Signed)
On simvastatin Checked lipid profile

## 2023-04-06 NOTE — Progress Notes (Signed)
Established Patient Office Visit  Subjective:  Patient ID: Catherine Joseph, female    DOB: 07-Oct-1940  Age: 82 y.o. MRN: [id number]  CC:  Chief Complaint  Patient presents with   Annual Exam    HPI Catherine Joseph is a 82 y.o. female with past medical history of atrial fibrillation, HTN, type II DM, HLD and obesity who presents for annual physical.  HTN and atrial fibrillation: Her BP is WNL today.  She has noticed higher BP readings at home,  but attributes it to anxiety.  She is currently taking irbesartan 300 mg QD and Hydralazine 50 mg BID instead of 3 times daily.  She reports epigastric pain with 3 times daily dosing (?).  She is taking flecainide and Xarelto for A-fib.  She is followed by Dr. Eden Emms for it.  She denies any headache, dizziness, chest pain, dyspnea or, palpitations.   Type II DM: She is on metformin 1000 mg QD and glipizide 10 mg twice daily currently.  Her HbA1c has improved to 6.6 now. Denies any polyuria or polyphagia currently.  She is also on simvastatin for HLD.   Her last BMP showed GFR around 53.  She currently denies any dysuria, hematuria or urinary hesitancy or resistance.  She reports intermittent epigastric pain, worse with eating.  Denies any nausea or vomiting.     Past Medical History:  Diagnosis Date   Anemia    Arthritis    Bradycardia    Cataract    Chronic kidney disease    Diabetes mellitus type II    Hypertension    Osteoporosis    Syncope    Weakness     Past Surgical History:  Procedure Laterality Date   ABDOMINAL HYSTERECTOMY     CESAREAN SECTION     COLON SURGERY     EYE SURGERY     FRACTURE SURGERY      Family History  Problem Relation Age of Onset   Diabetes Sister    Hyperlipidemia Sister    Hypertension Sister    Diabetes Brother    Hyperlipidemia Brother    Hypertension Brother     Social History   Socioeconomic History   Marital status: Widowed    Spouse name: Not on file   Number of  children: Not on file   Years of education: Not on file   Highest education level: Not on file  Occupational History   Not on file  Tobacco Use   Smoking status: Never   Smokeless tobacco: Never  Vaping Use   Vaping status: Never Used  Substance and Sexual Activity   Alcohol use: No   Drug use: No   Sexual activity: Not Currently  Other Topics Concern   Not on file  Social History Narrative   ambudexterty   No caffeine   Two story home    Social Determinants of Health   Financial Resource Strain: Not on file  Food Insecurity: Not on file  Transportation Needs: Not on file  Physical Activity: Not on file  Stress: Not on file  Social Connections: Not on file  Intimate Partner Violence: Not on file    Outpatient Medications Prior to Visit  Medication Sig Dispense Refill   ACCU-CHEK AVIVA PLUS test strip TEST BLOOD SUGAR ONE TIME DAILY 100 strip 1   Accu-Chek Softclix Lancets lancets TEST BLOOD SUGAR ONE TIME DAILY 100 each 3   acetaminophen (TYLENOL) 500 MG tablet Take 1 tablet (500 mg total) by mouth every  6 (six) hours as needed. 30 tablet 0   Alcohol Swabs (B-D SINGLE USE SWABS REGULAR) PADS USE TO CHECK BLOOD SUGAR DAILY 100 each 3   Blood Glucose Calibration (SURECHEK CONTROL SOLUTION) Normal LIQD Use to check blood sugar daily. Dx E11.65 1 each 3   Blood Glucose Monitoring Suppl (ACCU-CHEK AVIVA PLUS) w/Device KIT USE AS DIRECTED 1 kit 5   cyclobenzaprine (FLEXERIL) 5 MG tablet Take 1 tablet (5 mg total) by mouth 2 (two) times daily as needed for muscle spasms. 30 tablet 1   diclofenac Sodium (VOLTAREN) 1 % GEL Apply 2 g topically 4 (four) times daily as needed. 50 g 0   flecainide (TAMBOCOR) 100 MG tablet TAKE 1/2 TABLET TWICE DAILY (PLEASE KEEP UPCOMING APPT FOR FUTURE REFILLS) 90 tablet 1   fluticasone (FLONASE) 50 MCG/ACT nasal spray Place 2 sprays into both nostrils daily. 16 g 0   glipiZIDE (GLUCOTROL) 10 MG tablet TAKE 1 TABLET TWICE DAILY BEFORE MEALS 180 tablet  3   hydrALAZINE (APRESOLINE) 50 MG tablet Take 1 tablet (50 mg total) by mouth 3 (three) times daily. 270 tablet 1   irbesartan (AVAPRO) 300 MG tablet Take 1 tablet (300 mg total) by mouth daily. 90 tablet 3   lidocaine (LIDODERM) 5 % Place 1 patch onto the skin daily. Remove & Discard patch within 12 hours or as directed by MD 30 patch 0   meclizine (ANTIVERT) 25 MG tablet Take 1 tablet (25 mg total) by mouth 2 (two) times daily as needed for dizziness. 60 tablet 3   metFORMIN (GLUCOPHAGE-XR) 500 MG 24 hr tablet TAKE 2 TABLETS DAILY WITH BREAKFAST 180 tablet 1   nystatin cream (MYCOSTATIN) Apply 1 Application topically 2 (two) times daily. 30 g 1   polyethylene glycol powder (GLYCOLAX/MIRALAX) 17 GM/SCOOP powder Take 17 g by mouth 2 (two) times daily as needed. 255 g 3   polyethylene glycol powder (GLYCOLAX/MIRALAX) powder Take 17 g by mouth 2 (two) times daily as needed. 500 g 1   rivaroxaban (XARELTO) 20 MG TABS tablet TAKE 1 TABLET(20 MG) BY MOUTH DAILY WITH SUPPER 90 tablet 1   simvastatin (ZOCOR) 20 MG tablet TAKE 1 TABLET EVERY DAY AT 6:00PM (NEEDS APPT) 90 tablet 0   No facility-administered medications prior to visit.    Allergies  Allergen Reactions   Codeine Other (See Comments) and Rash    vertigo vertigo     ROS Review of Systems  Constitutional:  Negative for chills and fever.  HENT:  Negative for congestion, sinus pressure, sinus pain and sore throat.   Eyes:  Negative for pain and discharge.  Respiratory:  Negative for cough and shortness of breath.   Cardiovascular:  Negative for chest pain and palpitations.  Gastrointestinal:  Positive for abdominal pain. Negative for diarrhea, nausea and vomiting.  Endocrine: Negative for polydipsia and polyuria.  Genitourinary:  Negative for dysuria and hematuria.  Musculoskeletal:  Positive for back pain. Negative for neck pain and neck stiffness.  Skin:  Negative for rash.  Neurological:  Negative for dizziness and weakness.   Psychiatric/Behavioral:  Negative for agitation and behavioral problems.       Objective:    Physical Exam Vitals reviewed.  Constitutional:      General: She is not in acute distress.    Appearance: She is obese. She is not diaphoretic.  HENT:     Head: Normocephalic and atraumatic.     Nose: Nose normal.     Mouth/Throat:  Mouth: Mucous membranes are moist.  Eyes:     General: No scleral icterus.    Extraocular Movements: Extraocular movements intact.  Cardiovascular:     Rate and Rhythm: Normal rate. Rhythm irregular.     Heart sounds: Normal heart sounds. No murmur heard. Pulmonary:     Breath sounds: Normal breath sounds. No wheezing or rales.  Abdominal:     General: Bowel sounds are normal.     Palpations: Abdomen is soft.     Tenderness: There is abdominal tenderness (Mild, epigastric). There is no guarding or rebound.  Musculoskeletal:     Cervical back: Neck supple. No tenderness.     Lumbar back: Tenderness present.     Right lower leg: No edema.     Left lower leg: No edema.  Skin:    General: Skin is warm.     Findings: No rash.  Neurological:     General: No focal deficit present.     Mental Status: She is alert and oriented to person, place, and time.     Cranial Nerves: No cranial nerve deficit.     Sensory: No sensory deficit.     Motor: No weakness.  Psychiatric:        Mood and Affect: Mood normal.        Behavior: Behavior normal.     BP 134/62 (BP Location: Left Arm)   Pulse 90   Ht 5\' 2"  (1.575 m)   Wt 186 lb 3.2 oz (84.5 kg)   SpO2 95%   BMI 34.06 kg/m  Wt Readings from Last 3 Encounters:  04/06/23 186 lb 3.2 oz (84.5 kg)  01/31/23 190 lb 12.8 oz (86.5 kg)  09/29/22 186 lb (84.4 kg)    Lab Results  Component Value Date   TSH 1.510 11/22/2018   Lab Results  Component Value Date   WBC 4.0 05/26/2022   HGB 13.9 05/26/2022   HCT 42.6 05/26/2022   MCV 86 05/26/2022   PLT 183 05/26/2022   Lab Results  Component Value Date    NA 141 01/31/2023   K 4.9 01/31/2023   CO2 24 01/31/2023   GLUCOSE 137 (H) 01/31/2023   BUN 16 01/31/2023   CREATININE 1.04 (H) 01/31/2023   BILITOT 0.4 01/31/2023   ALKPHOS 100 01/31/2023   AST 27 01/31/2023   ALT 11 01/31/2023   PROT 7.7 01/31/2023   ALBUMIN 4.0 01/31/2023   CALCIUM 8.8 01/31/2023   ANIONGAP 9 07/18/2020   EGFR 53 (L) 01/31/2023   GFR 63.85 03/17/2015   Lab Results  Component Value Date   CHOL 166 09/17/2022   Lab Results  Component Value Date   HDL 61 09/17/2022   Lab Results  Component Value Date   LDLCALC 89 09/17/2022   Lab Results  Component Value Date   TRIG 86 09/17/2022   Lab Results  Component Value Date   CHOLHDL 2.7 09/17/2022   Lab Results  Component Value Date   HGBA1C 6.6 (H) 01/31/2023      Assessment & Plan:   Problem List Items Addressed This Visit       Cardiovascular and Mediastinum   Hypertension    BP Readings from Last 1 Encounters:  04/06/23 134/62   Well-controlled with irbesartan 300 mg QD and hydralazine 50 mg BID As her BP is well controlled today, would avoid increasing dose of medicines - her home BP device may need evaluation/comparison in office Counseled for compliance with the medications Advised DASH diet and  moderate exercise/walking as tolerated      Relevant Medications   simvastatin (ZOCOR) 20 MG tablet   Other Relevant Orders   TSH   CMP14+EGFR   CBC with Differential/Platelet   Atrial fibrillation (HCC)    On flecainide and Xarelto Followed by cardiology      Relevant Medications   simvastatin (ZOCOR) 20 MG tablet   Other Relevant Orders   TSH     Digestive   Gastroesophageal reflux disease    Her epigastric discomfort is likely due to GERD Added Pepcid 40 mg once daily      Relevant Medications   famotidine (PEPCID) 40 MG tablet     Endocrine   Type 2 diabetes mellitus with other specified complication (HCC)    Lab Results  Component Value Date   HGBA1C 6.6 (H)  01/31/2023   Well-controlled Associated with HTN and HLD On metformin 1000 mg QD and glipizide 10 mg twice daily- needs to continue to take it BID Advised to follow diabetic diet On statin and ARB F/u CMP and lipid panel Diabetic eye exam: Advised to follow up with Ophthalmology for diabetic eye exam      Relevant Medications   simvastatin (ZOCOR) 20 MG tablet   Other Relevant Orders   Hemoglobin A1c   CMP14+EGFR   Urine Microalbumin w/creat. ratio     Genitourinary   Stage 3a chronic kidney disease (HCC)    Last BMP reviewed, GFR stable at 53 Avoid nephrotoxic agents On ARB Checked urine microalbumin/creatinine ratio      Relevant Orders   VITAMIN D 25 Hydroxy (Vit-D Deficiency, Fractures)   CMP14+EGFR   CBC with Differential/Platelet   Urine Microalbumin w/creat. ratio     Other   Mixed hyperlipidemia    On simvastatin Checked lipid profile      Relevant Medications   simvastatin (ZOCOR) 20 MG tablet   Other Relevant Orders   Lipid panel   Encounter for general adult medical examination with abnormal findings - Primary    Physical exam as documented. Fasting blood tests reviewed today. Has had Shingrix vaccines at [facility] - date unknown.      Other Visit Diagnoses     Encounter for immunization       Relevant Orders   Flu Vaccine Trivalent High Dose (Fluad) (Completed)       Meds ordered this encounter  Medications   famotidine (PEPCID) 40 MG tablet    Sig: Take 1 tablet (40 mg total) by mouth daily.    Dispense:  90 tablet    Refill:  1   simvastatin (ZOCOR) 20 MG tablet    Sig: Take 1 tablet (20 mg total) by mouth daily at 6 PM.    Dispense:  90 tablet    Refill:  3    Follow-up: Return in about 3 months (around 07/07/2023) for HTN and DM.    Anabel Halon, MD

## 2023-04-06 NOTE — Assessment & Plan Note (Addendum)
Lab Results  Component Value Date   HGBA1C 6.6 (H) 01/31/2023   Well-controlled Associated with HTN and HLD On metformin 1000 mg QD and glipizide 10 mg twice daily- needs to continue to take it BID Advised to follow diabetic diet On statin and ARB F/u CMP and lipid panel Diabetic eye exam: Advised to follow up with Ophthalmology for diabetic eye exam

## 2023-04-06 NOTE — Assessment & Plan Note (Addendum)
Last BMP reviewed, GFR stable at 53 Avoid nephrotoxic agents On ARB Checked urine microalbumin/creatinine ratio

## 2023-04-06 NOTE — Assessment & Plan Note (Addendum)
BP Readings from Last 1 Encounters:  04/06/23 134/62   Well-controlled with irbesartan 300 mg QD and hydralazine 50 mg BID As her BP is well controlled today, would avoid increasing dose of medicines - her home BP device may need evaluation/comparison in office Counseled for compliance with the medications Advised DASH diet and moderate exercise/walking as tolerated

## 2023-04-06 NOTE — Assessment & Plan Note (Signed)
Her epigastric discomfort is likely due to GERD Added Pepcid 40 mg once daily

## 2023-04-06 NOTE — Assessment & Plan Note (Addendum)
Physical exam as documented. Fasting blood tests reviewed today. Has had Shingrix vaccines at [facility] - date unknown.

## 2023-04-06 NOTE — Assessment & Plan Note (Signed)
On flecainide and Xarelto Followed by cardiology

## 2023-04-07 ENCOUNTER — Other Ambulatory Visit: Payer: Self-pay | Admitting: Internal Medicine

## 2023-04-07 DIAGNOSIS — E1165 Type 2 diabetes mellitus with hyperglycemia: Secondary | ICD-10-CM

## 2023-04-21 ENCOUNTER — Telehealth: Payer: Self-pay | Admitting: Cardiovascular Disease

## 2023-04-21 NOTE — Telephone Encounter (Signed)
Returned call to pt. No answer, mailbox not setup.

## 2023-04-21 NOTE — Telephone Encounter (Signed)
Pt c/o medication issue:  1. Name of Medication:   rivaroxaban (XARELTO) 20 MG TABS tablet   2. How are you currently taking this medication (dosage and times per day)?   As prescribed  3. Are you having a reaction (difficulty breathing--STAT)?   4. What is your medication issue?   Patient stated she cannot afford this medication and wants to change this medication or next steps.

## 2023-04-25 ENCOUNTER — Other Ambulatory Visit: Payer: Self-pay | Admitting: Internal Medicine

## 2023-04-25 DIAGNOSIS — R42 Dizziness and giddiness: Secondary | ICD-10-CM

## 2023-04-25 NOTE — Telephone Encounter (Signed)
Left a message for patient to call office back regarding Xarelto patient assistance.

## 2023-04-27 NOTE — Telephone Encounter (Signed)
Returned call to pt regarding Xarelto. No answer, no voicemail.

## 2023-05-12 DIAGNOSIS — E119 Type 2 diabetes mellitus without complications: Secondary | ICD-10-CM | POA: Diagnosis not present

## 2023-05-17 ENCOUNTER — Telehealth: Payer: Self-pay

## 2023-05-17 NOTE — Telephone Encounter (Signed)
Left message for patient to call our office.

## 2023-05-17 NOTE — Telephone Encounter (Signed)
Copied from CRM [id number]. Topic: Appointments - Appointment Info/Confirmation >> May 17, 2023  3:52 PM Tiffany H wrote: Patient called to advise that she stopped taking Metformin in November at her own behest. Patient advised that she would rather take Glucobio. Please assist.   CB: [phone]

## 2023-05-31 ENCOUNTER — Other Ambulatory Visit: Payer: Self-pay | Admitting: Internal Medicine

## 2023-05-31 DIAGNOSIS — L304 Erythema intertrigo: Secondary | ICD-10-CM

## 2023-06-17 NOTE — Progress Notes (Deleted)
Patient ID: Catherine Joseph, female   DOB: Jun 19, 1941, 82 y.o.   MRN: [id number]      82 y.o.  [place] female history of PAF on flecainide. Has been on coumadin and now xarelto for anticoagulationHistory of atypical chest pain with normal stress echo 2013 and normal myovue 03/21/14   Lives with daughter/husband and 3 grand children ages 36-15 years Originally from [place] and lived in [place] No longer traveling to [place] Had two sisters die of cancer this year Still has nephews in [place]  No palpitations dyspnea or chest pain No bleeding issues [facility]  checks her lab work   Has some chronic vertigo and BPPV  No palpitations or bleeding issues   Seen in ED 07/18/20 with right flank/back pain negative w/u including CT abdomen and negative UA  No cardiac issues Lives with daughter / husband with 4 kids Seeing Dr Allena Katz for primary now Having some GERD  Rx with Pepcid Has lots of arthritis but still gets around ok    ROS: Denies fever, malais, weight loss, blurry vision, decreased visual acuity, cough, sputum, SOB, hemoptysis, pleuritic pain, palpitaitons, heartburn, abdominal pain, melena, lower extremity edema, claudication, or rash.  All other systems reviewed and negative  General: There were no vitals taken for this visit. Affect appropriate Healthy:  appears stated age HEENT: normal Neck supple with no adenopathy JVP normal no bruits no thyromegaly Lungs clear with no wheezing and good diaphragmatic motion Heart:  S1/S2 no murmur, no rub, gallop or click PMI normal Abdomen: benighn, BS positve, no tenderness, no AAA no bruit.  No HSM or HJR Distal pulses intact with no bruits No edema Neuro non-focal Skin warm and dry No muscular weakness    Current Outpatient Medications  Medication Sig Dispense Refill   ACCU-CHEK AVIVA PLUS test strip TEST BLOOD SUGAR ONE TIME DAILY 100 strip 1   Accu-Chek Softclix Lancets lancets TEST BLOOD SUGAR ONE TIME  DAILY 100 each 3   acetaminophen (TYLENOL) 500 MG tablet Take 1 tablet (500 mg total) by mouth every 6 (six) hours as needed. 30 tablet 0   Alcohol Swabs (B-D SINGLE USE SWABS REGULAR) PADS USE TO CHECK BLOOD SUGAR DAILY 100 each 3   Blood Glucose Calibration (SURECHEK CONTROL SOLUTION) Normal LIQD Use to check blood sugar daily. Dx E11.65 1 each 3   Blood Glucose Monitoring Suppl (ACCU-CHEK AVIVA PLUS) w/Device KIT USE AS DIRECTED 1 kit 5   cyclobenzaprine (FLEXERIL) 5 MG tablet Take 1 tablet (5 mg total) by mouth 2 (two) times daily as needed for muscle spasms. 30 tablet 1   diclofenac Sodium (VOLTAREN) 1 % GEL Apply 2 g topically 4 (four) times daily as needed. 50 g 0   famotidine (PEPCID) 40 MG tablet Take 1 tablet (40 mg total) by mouth daily. 90 tablet 1   flecainide (TAMBOCOR) 100 MG tablet TAKE 1/2 TABLET TWICE DAILY (PLEASE KEEP UPCOMING APPT FOR FUTURE REFILLS) 90 tablet 1   fluticasone (FLONASE) 50 MCG/ACT nasal spray Place 2 sprays into both nostrils daily. 16 g 0   glipiZIDE (GLUCOTROL) 10 MG tablet TAKE 1 TABLET TWICE DAILY BEFORE MEALS 180 tablet 3   hydrALAZINE (APRESOLINE) 50 MG tablet Take 1 tablet (50 mg total) by mouth 3 (three) times daily. 270 tablet 1   irbesartan (AVAPRO) 300 MG tablet Take 1 tablet (300 mg total) by mouth daily. 90 tablet 3   lidocaine (LIDODERM) 5 % Place 1 patch onto the skin  daily. Remove & Discard patch within 12 hours or as directed by MD 30 patch 0   meclizine (ANTIVERT) 25 MG tablet TAKE 1 TABLET TWICE DAILY AS NEEDED FOR DIZZINESS 60 tablet 3   metFORMIN (GLUCOPHAGE-XR) 500 MG 24 hr tablet TAKE 2 TABLETS EVERY DAY WITH BREAKFAST 180 tablet 3   nystatin cream (MYCOSTATIN) APPLY 1 APPLICATION TOPICALLY 2 (TWO) TIMES DAILY. 30 g 11   polyethylene glycol powder (GLYCOLAX/MIRALAX) 17 GM/SCOOP powder Take 17 g by mouth 2 (two) times daily as needed. 255 g 3   polyethylene glycol powder (GLYCOLAX/MIRALAX) powder Take 17 g by mouth 2 (two) times daily as  needed. 500 g 1   rivaroxaban (XARELTO) 20 MG TABS tablet TAKE 1 TABLET(20 MG) BY MOUTH DAILY WITH SUPPER 90 tablet 1   simvastatin (ZOCOR) 20 MG tablet Take 1 tablet (20 mg total) by mouth daily at 6 PM. 90 tablet 3   No current facility-administered medications for this visit.    Allergies  Codeine  Electrocardiogram:   08/02/18 SR rate 69 PAC otherwise normal  03/09/19 SR rate 69 PAC;s no acute changes 06/17/2023 SR rate 65 QT 430 msec   Assessment and Plan  PAF: maintains NSR on flecainide Anticoagulation with xarelto although there have been some issues affording long discussion with daughter about need to continue both Filled out patient assistance paperwork for xarelto  HTN:  Well controlled.  Continue current medications and low sodium Dash type diet.    Chol:  Continue statin LDL 89 08/2922 on Zocor 20 mg now   DM:  Discussed low carb diet.  Target hemoglobin A1c is 6.5 or less.  Continue current medications. Lab Results  Component Value Date   HGBA1C 6.6 (H) 01/31/2023    Primary f/u with Dr Allena Katz  F/U with me in a year   Charlton Haws

## 2023-06-19 ENCOUNTER — Other Ambulatory Visit: Payer: Self-pay | Admitting: Cardiovascular Disease

## 2023-06-19 DIAGNOSIS — I480 Paroxysmal atrial fibrillation: Secondary | ICD-10-CM

## 2023-06-24 ENCOUNTER — Ambulatory Visit: Payer: Medicare HMO | Admitting: Cardiovascular Disease

## 2023-07-12 ENCOUNTER — Encounter: Payer: Self-pay | Admitting: Internal Medicine

## 2023-07-12 ENCOUNTER — Ambulatory Visit: Payer: Medicare PPO | Admitting: Internal Medicine

## 2023-07-12 VITALS — BP 148/86 | HR 98 | Ht 62.0 in | Wt 193.0 lb

## 2023-07-12 DIAGNOSIS — I480 Paroxysmal atrial fibrillation: Secondary | ICD-10-CM

## 2023-07-12 DIAGNOSIS — M545 Low back pain, unspecified: Secondary | ICD-10-CM | POA: Diagnosis not present

## 2023-07-12 DIAGNOSIS — N1831 Chronic kidney disease, stage 3a: Secondary | ICD-10-CM

## 2023-07-12 DIAGNOSIS — I10 Essential (primary) hypertension: Secondary | ICD-10-CM | POA: Diagnosis not present

## 2023-07-12 DIAGNOSIS — E1169 Type 2 diabetes mellitus with other specified complication: Secondary | ICD-10-CM

## 2023-07-12 DIAGNOSIS — G8929 Other chronic pain: Secondary | ICD-10-CM

## 2023-07-12 DIAGNOSIS — M48061 Spinal stenosis, lumbar region without neurogenic claudication: Secondary | ICD-10-CM | POA: Diagnosis not present

## 2023-07-12 DIAGNOSIS — Z7984 Long term (current) use of oral hypoglycemic drugs: Secondary | ICD-10-CM | POA: Diagnosis not present

## 2023-07-12 DIAGNOSIS — E782 Mixed hyperlipidemia: Secondary | ICD-10-CM | POA: Diagnosis not present

## 2023-07-12 MED ORDER — TRAMADOL HCL 50 MG PO TABS: 50.0000 mg | ORAL_TABLET | Freq: Two times a day (BID) | ORAL | 0 refills | Status: DC | PRN

## 2023-07-12 MED ORDER — HYDRALAZINE HCL 50 MG PO TABS: 50.0000 mg | ORAL_TABLET | Freq: Three times a day (TID) | ORAL | 1 refills | Status: DC

## 2023-07-12 NOTE — Progress Notes (Unsigned)
Established Patient Office Visit  Subjective:  Patient ID: Catherine Joseph, female    DOB: [birth year]  Age: 83 y.o. MRN: [id number]  CC:  Chief Complaint  Patient presents with   Care Management    3 month f/u   Back Pain    Pt reports sx of back pain for 3 months, tylenol not helping.     HPI Catherine Joseph is a 83 y.o. female with past medical history of atrial fibrillation, HTN, type II DM, HLD and obesity who presents for f/u of her chronic medical conditions.  HTN and atrial fibrillation: Her BP is WNL today.  She has noticed higher BP readings at home,  but attributes it to anxiety.  She is currently taking irbesartan 300 mg QD and Hydralazine 50 mg BID instead of 3 times daily.  She reports epigastric pain with 3 times daily dosing (?).  She is taking flecainide and Xarelto for A-fib.  She is followed by Dr. Eden Emms for it.  She denies any headache, dizziness, chest pain, dyspnea or, palpitations.   Type II DM: She is on metformin 1000 mg QD and glipizide 10 mg twice daily currently.  Her HbA1c had improved to 6.6 now. Denies any polyuria or polyphagia currently.  She is also on simvastatin for HLD.   Her last BMP showed GFR around 53.  She currently denies any dysuria, hematuria or urinary hesitancy or resistance.  She reports intermittent epigastric pain, worse with eating.  Denies any nausea or vomiting.  Back pain:   Past Medical History:  Diagnosis Date   Anemia    Arthritis    Bradycardia    Cataract    Chronic kidney disease    Diabetes mellitus type II    Hypertension    Osteoporosis    Syncope    Weakness     Past Surgical History:  Procedure Laterality Date   ABDOMINAL HYSTERECTOMY     CESAREAN SECTION     COLON SURGERY     EYE SURGERY     FRACTURE SURGERY      Family History  Problem Relation Age of Onset   Diabetes Sister    Hyperlipidemia Sister    Hypertension Sister    Diabetes Brother    Hyperlipidemia Brother    Hypertension  Brother     Social History   Socioeconomic History   Marital status: Widowed    Spouse name: Not on file   Number of children: Not on file   Years of education: Not on file   Highest education level: Not on file  Occupational History   Not on file  Tobacco Use   Smoking status: Never   Smokeless tobacco: Never  Vaping Use   Vaping status: Never Used  Substance and Sexual Activity   Alcohol use: No   Drug use: No   Sexual activity: Not Currently  Other Topics Concern   Not on file  Social History Narrative   ambudexterty   No caffeine   Two story home    Social Drivers of Corporate investment banker Strain: Not on file  Food Insecurity: Not on file  Transportation Needs: Not on file  Physical Activity: Not on file  Stress: Not on file  Social Connections: Not on file  Intimate Partner Violence: Not on file    Outpatient Medications Prior to Visit  Medication Sig Dispense Refill   ACCU-CHEK AVIVA PLUS test strip TEST BLOOD SUGAR ONE TIME DAILY 100 strip 1  Accu-Chek Softclix Lancets lancets TEST BLOOD SUGAR ONE TIME DAILY 100 each 3   acetaminophen (TYLENOL) 500 MG tablet Take 1 tablet (500 mg total) by mouth every 6 (six) hours as needed. 30 tablet 0   Alcohol Swabs (B-D SINGLE USE SWABS REGULAR) PADS USE TO CHECK BLOOD SUGAR DAILY 100 each 3   Blood Glucose Calibration (SURECHEK CONTROL SOLUTION) Normal LIQD Use to check blood sugar daily. Dx E11.65 1 each 3   Blood Glucose Monitoring Suppl (ACCU-CHEK AVIVA PLUS) w/Device KIT USE AS DIRECTED 1 kit 5   cyclobenzaprine (FLEXERIL) 5 MG tablet Take 1 tablet (5 mg total) by mouth 2 (two) times daily as needed for muscle spasms. 30 tablet 1   diclofenac Sodium (VOLTAREN) 1 % GEL Apply 2 g topically 4 (four) times daily as needed. 50 g 0   famotidine (PEPCID) 40 MG tablet Take 1 tablet (40 mg total) by mouth daily. 90 tablet 1   flecainide (TAMBOCOR) 100 MG tablet TAKE 1/2 TABLET TWICE DAILY (PLEASE KEEP UPCOMING APPT FOR  FUTURE REFILLS) 90 tablet 1   fluticasone (FLONASE) 50 MCG/ACT nasal spray Place 2 sprays into both nostrils daily. 16 g 0   glipiZIDE (GLUCOTROL) 10 MG tablet TAKE 1 TABLET TWICE DAILY BEFORE MEALS 180 tablet 3   hydrALAZINE (APRESOLINE) 50 MG tablet Take 1 tablet (50 mg total) by mouth 3 (three) times daily. 270 tablet 1   irbesartan (AVAPRO) 300 MG tablet Take 1 tablet (300 mg total) by mouth daily. 90 tablet 3   lidocaine (LIDODERM) 5 % Place 1 patch onto the skin daily. Remove & Discard patch within 12 hours or as directed by MD 30 patch 0   meclizine (ANTIVERT) 25 MG tablet TAKE 1 TABLET TWICE DAILY AS NEEDED FOR DIZZINESS 60 tablet 3   metFORMIN (GLUCOPHAGE-XR) 500 MG 24 hr tablet TAKE 2 TABLETS EVERY DAY WITH BREAKFAST 180 tablet 3   nystatin cream (MYCOSTATIN) APPLY 1 APPLICATION TOPICALLY 2 (TWO) TIMES DAILY. 30 g 11   polyethylene glycol powder (GLYCOLAX/MIRALAX) 17 GM/SCOOP powder Take 17 g by mouth 2 (two) times daily as needed. 255 g 3   polyethylene glycol powder (GLYCOLAX/MIRALAX) powder Take 17 g by mouth 2 (two) times daily as needed. 500 g 1   rivaroxaban (XARELTO) 20 MG TABS tablet TAKE 1 TABLET(20 MG) BY MOUTH DAILY WITH SUPPER 90 tablet 1   simvastatin (ZOCOR) 20 MG tablet Take 1 tablet (20 mg total) by mouth daily at 6 PM. 90 tablet 3   No facility-administered medications prior to visit.    Allergies  Allergen Reactions   Codeine Other (See Comments) and Rash    vertigo vertigo     ROS Review of Systems  Constitutional:  Negative for chills and fever.  HENT:  Negative for congestion, sinus pressure, sinus pain and sore throat.   Eyes:  Negative for pain and discharge.  Respiratory:  Negative for cough and shortness of breath.   Cardiovascular:  Negative for chest pain and palpitations.  Gastrointestinal:  Positive for abdominal pain. Negative for diarrhea, nausea and vomiting.  Endocrine: Negative for polydipsia and polyuria.  Genitourinary:  Negative for  dysuria and hematuria.  Musculoskeletal:  Positive for back pain. Negative for neck pain and neck stiffness.  Skin:  Negative for rash.  Neurological:  Negative for dizziness and weakness.  Psychiatric/Behavioral:  Negative for agitation and behavioral problems.       Objective:    Physical Exam Vitals reviewed.  Constitutional:  General: She is not in acute distress.    Appearance: She is obese. She is not diaphoretic.  HENT:     Head: Normocephalic and atraumatic.     Nose: Nose normal.     Mouth/Throat:     Mouth: Mucous membranes are moist.  Eyes:     General: No scleral icterus.    Extraocular Movements: Extraocular movements intact.  Cardiovascular:     Rate and Rhythm: Normal rate. Rhythm irregular.     Heart sounds: Normal heart sounds. No murmur heard. Pulmonary:     Breath sounds: Normal breath sounds. No wheezing or rales.  Abdominal:     General: Bowel sounds are normal.     Palpations: Abdomen is soft.     Tenderness: There is abdominal tenderness (Mild, epigastric). There is no guarding or rebound.  Musculoskeletal:     Cervical back: Neck supple. No tenderness.     Lumbar back: Tenderness present.     Right lower leg: No edema.     Left lower leg: No edema.  Skin:    General: Skin is warm.     Findings: No rash.  Neurological:     General: No focal deficit present.     Mental Status: She is alert and oriented to person, place, and time.     Cranial Nerves: No cranial nerve deficit.     Sensory: No sensory deficit.     Motor: No weakness.  Psychiatric:        Mood and Affect: Mood normal.        Behavior: Behavior normal.     BP (!) 142/80 (BP Location: Left Arm)   Pulse 98   Ht 5\' 2"  (1.575 m)   Wt 193 lb (87.5 kg)   SpO2 96%   BMI 35.30 kg/m  Wt Readings from Last 3 Encounters:  07/12/23 193 lb (87.5 kg)  04/06/23 186 lb 3.2 oz (84.5 kg)  01/31/23 190 lb 12.8 oz (86.5 kg)    Lab Results  Component Value Date   TSH 1.510 11/22/2018    Lab Results  Component Value Date   WBC 4.0 05/26/2022   HGB 13.9 05/26/2022   HCT 42.6 05/26/2022   MCV 86 05/26/2022   PLT 183 05/26/2022   Lab Results  Component Value Date   NA 141 01/31/2023   K 4.9 01/31/2023   CO2 24 01/31/2023   GLUCOSE 137 (H) 01/31/2023   BUN 16 01/31/2023   CREATININE 1.04 (H) 01/31/2023   BILITOT 0.4 01/31/2023   ALKPHOS 100 01/31/2023   AST 27 01/31/2023   ALT 11 01/31/2023   PROT 7.7 01/31/2023   ALBUMIN 4.0 01/31/2023   CALCIUM 8.8 01/31/2023   ANIONGAP 9 07/18/2020   EGFR 53 (L) 01/31/2023   GFR 63.85 03/17/2015   Lab Results  Component Value Date   CHOL 166 09/17/2022   Lab Results  Component Value Date   HDL 61 09/17/2022   Lab Results  Component Value Date   LDLCALC 89 09/17/2022   Lab Results  Component Value Date   TRIG 86 09/17/2022   Lab Results  Component Value Date   CHOLHDL 2.7 09/17/2022   Lab Results  Component Value Date   HGBA1C 6.6 (H) 01/31/2023      Assessment & Plan:   Problem List Items Addressed This Visit   None    No orders of the defined types were placed in this encounter.   Follow-up: No follow-ups on file.  Anabel Halon, MD

## 2023-07-12 NOTE — Assessment & Plan Note (Signed)
On simvastatin °Check lipid profile °

## 2023-07-12 NOTE — Assessment & Plan Note (Signed)
On flecainide and Xarelto, has run out of Xarelto due to cost concern - advised to contact cardiology office Followed by cardiology

## 2023-07-12 NOTE — Assessment & Plan Note (Signed)
BP Readings from Last 1 Encounters:  07/12/23 (!) 142/80   Well-controlled with irbesartan 300 mg QD and hydralazine 50 mg BID As her BP is well controlled today, would avoid increasing dose of medicines - her home BP device may need evaluation/comparison in office Counseled for compliance with the medications Advised DASH diet and moderate exercise/walking as tolerated

## 2023-07-12 NOTE — Assessment & Plan Note (Signed)
Last BMP reviewed, GFR stable at 53 Avoid nephrotoxic agents On ARB Check urine microalbumin/creatinine ratio

## 2023-07-12 NOTE — Assessment & Plan Note (Signed)
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

## 2023-07-12 NOTE — Patient Instructions (Addendum)
Please schedule Cardiology follow up at [phone].  Please start taking Hydralazine 3 times as prescribed.  Please take Tylenol as needed for mild-moderate back pain. Please start taking Tramadol as needed for severe back pain.  Please continue to take other medications as prescribed.  Please continue to follow low carb diet and perform moderate exercise/walking as tolerated.

## 2023-07-13 LAB — LIPID PANEL

## 2023-07-14 ENCOUNTER — Telehealth: Payer: Self-pay | Admitting: Internal Medicine

## 2023-07-14 LAB — CBC WITH DIFFERENTIAL/PLATELET
Basophils Absolute: 0 10*3/uL (ref 0.0–0.2)
Basos: 0 %
EOS (ABSOLUTE): 0 10*3/uL (ref 0.0–0.4)
Eos: 0 %
Hematocrit: 42.8 % (ref 34.0–46.6)
Hemoglobin: 13.6 g/dL (ref 11.1–15.9)
Immature Grans (Abs): 0 10*3/uL (ref 0.0–0.1)
Immature Granulocytes: 0 %
Lymphocytes Absolute: 1.6 10*3/uL (ref 0.7–3.1)
Lymphs: 45 %
MCH: 27.8 pg (ref 26.6–33.0)
MCHC: 31.8 g/dL (ref 31.5–35.7)
MCV: 88 fL (ref 79–97)
Monocytes Absolute: 0.2 10*3/uL (ref 0.1–0.9)
Monocytes: 5 %
Neutrophils Absolute: 1.8 10*3/uL (ref 1.4–7.0)
Neutrophils: 50 %
Platelets: 170 10*3/uL (ref 150–450)
RBC: 4.89 x10E6/uL (ref 3.77–5.28)
RDW: 13 % (ref 11.7–15.4)
WBC: 3.6 10*3/uL (ref 3.4–10.8)

## 2023-07-14 LAB — LIPID PANEL
Cholesterol, Total: 188 mg/dL (ref 100–199)
HDL: 61 mg/dL (ref >39)
LDL CALC COMMENT:: 3.1 ratio (ref 0.0–4.4)
LDL Chol Calc (NIH): 113 mg/dL — ABNORMAL HIGH (ref 0–99)
Triglycerides: 77 mg/dL (ref 0–149)
VLDL Cholesterol Cal: 14 mg/dL (ref 5–40)

## 2023-07-14 LAB — CMP14+EGFR
ALT: 14 IU/L (ref 0–32)
AST: 20 IU/L (ref 0–40)
Albumin: 4.2 g/dL (ref 3.7–4.7)
Alkaline Phosphatase: 124 IU/L — ABNORMAL HIGH (ref 44–121)
BUN/Creatinine Ratio: 13 (ref 12–28)
BUN: 15 mg/dL (ref 8–27)
Bilirubin Total: 0.6 mg/dL (ref 0.0–1.2)
CO2: 20 mmol/L (ref 20–29)
Calcium: 9.3 mg/dL (ref 8.7–10.3)
Chloride: 104 mmol/L (ref 96–106)
Creatinine, Ser: 1.12 mg/dL — ABNORMAL HIGH (ref 0.57–1.00)
Globulin, Total: 4.2 g/dL (ref 1.5–4.5)
Glucose: 74 mg/dL (ref 70–99)
Potassium: 4.2 mmol/L (ref 3.5–5.2)
Sodium: 144 mmol/L (ref 134–144)
Total Protein: 8.4 g/dL (ref 6.0–8.5)
eGFR: 48 mL/min/{1.73_m2} — ABNORMAL LOW (ref >59)

## 2023-07-14 LAB — MICROALBUMIN / CREATININE URINE RATIO
Creatinine, Urine: 61.1 mg/dL
Microalb/Creat Ratio: 17 mg/g{creat} (ref 0–29)
Microalbumin, Urine: 10.2 ug/mL

## 2023-07-14 LAB — HEMOGLOBIN A1C
Est. average glucose Bld gHb Est-mCnc: 143 mg/dL
Hgb A1c MFr Bld: 6.6 % — ABNORMAL HIGH (ref 4.8–5.6)

## 2023-07-14 LAB — TSH: TSH: 1.03 u[IU]/mL (ref 0.450–4.500)

## 2023-07-14 LAB — VITAMIN D 25 HYDROXY (VIT D DEFICIENCY, FRACTURES): Vit D, 25-Hydroxy: 20.5 ng/mL — ABNORMAL LOW (ref 30.0–100.0)

## 2023-07-14 NOTE — Telephone Encounter (Signed)
Spoke with patient. Advised of lab results with verbal understanding. Follow up appt scheduled for October 12, 2023... Abby Inola Lisle, CMA  [facility] Team Direct Dial: [phone]

## 2023-07-14 NOTE — Telephone Encounter (Signed)
Copied from CRM [id number]. Topic: Clinical - Lab/Test Results >> Jul 14, 2023  9:41 AM Carlatta H wrote: Reason for CRM: Patient called back to get lab results//Please call back

## 2023-07-14 NOTE — Assessment & Plan Note (Signed)
Lab Results  Component Value Date   HGBA1C 6.6 (H) 07/12/2023   Well-controlled Associated with HTN and HLD On glipizide 10 mg twice daily Has stopped taking metformin 1000 mg once daily, and does not want to take it now - denies to let me discuss with her other physician as well Advised to follow diabetic diet On statin and ARB F/u CMP and lipid panel Diabetic eye exam: Advised to follow up with Ophthalmology for diabetic eye exam

## 2023-08-01 ENCOUNTER — Ambulatory Visit (INDEPENDENT_AMBULATORY_CARE_PROVIDER_SITE_OTHER): Payer: Medicare PPO

## 2023-08-01 VITALS — BP 166/80 | Ht 61.0 in | Wt 190.0 lb

## 2023-08-01 DIAGNOSIS — Z0000 Encounter for general adult medical examination without abnormal findings: Secondary | ICD-10-CM | POA: Diagnosis not present

## 2023-08-01 DIAGNOSIS — Z789 Other specified health status: Secondary | ICD-10-CM

## 2023-08-01 DIAGNOSIS — Z59819 Housing instability, housed unspecified: Secondary | ICD-10-CM

## 2023-08-01 DIAGNOSIS — Z5982 Transportation insecurity: Secondary | ICD-10-CM | POA: Diagnosis not present

## 2023-08-01 DIAGNOSIS — Z5941 Food insecurity: Secondary | ICD-10-CM | POA: Diagnosis not present

## 2023-08-01 NOTE — Progress Notes (Signed)
 Because this visit was a virtual/telehealth visit,  certain criteria was not obtained, such a blood pressure, CBG if applicable, and timed get up and go. Any medications not marked as "taking" were not mentioned during the medication reconciliation part of the visit. Any vitals not documented were not able to be obtained due to this being a telehealth visit or patient was unable to self-report a recent blood pressure reading due to a lack of equipment at home via telehealth. Vitals that have been documented are verbally provided by the patient.  Interactive audio and video telecommunications were attempted between this provider and patient, however failed, due to patient having technical difficulties OR patient did not have access to video capability.  We continued and completed visit with audio only.  Subjective:   Catherine Joseph is a 83 y.o. female who presents for Medicare Annual (Subsequent) preventive examination.  Visit Complete: Virtual I connected with  Catherine Joseph on 08/01/23 by a audio enabled telemedicine application and verified that I am speaking with the correct person using two identifiers.  Patient Location: Home  Provider Location: Home Office  I discussed the limitations of evaluation and management by telemedicine. The patient expressed understanding and agreed to proceed.  Vital Signs: Because this visit was a virtual/telehealth visit, some criteria may be missing or patient reported. Any vitals not documented were not able to be obtained and vitals that have been documented are patient reported.   Cardiac Risk Factors include: advanced age (>35men, >56 women);diabetes mellitus;dyslipidemia;sedentary lifestyle;obesity (BMI >30kg/m2)     Objective:    Today's Vitals   08/01/23 1415 08/01/23 1416  BP: (!) 166/80   Weight: 190 lb (86.2 kg)   Height: 5\' 1"  (1.549 m)   PainSc:  0-No pain   Body mass index is 35.9 kg/m.     08/01/2023    2:15 PM 07/05/2022     4:09 PM 07/18/2020   11:25 AM 04/21/2020    2:49 PM 12/04/2019   12:28 PM 03/05/2019    2:11 PM 04/16/2017   10:49 AM  Advanced Directives  Does Patient Have a Medical Advance Directive? No No No Yes No Yes No  Does patient want to make changes to medical advance directive?      No - Patient declined   Would patient like information on creating a medical advance directive? No - Patient declined Yes (ED - Information included in AVS) No - Patient declined  No - Patient declined  No - Patient declined    Current Medications (verified) Outpatient Encounter Medications as of 08/01/2023  Medication Sig   ACCU-CHEK AVIVA PLUS test strip TEST BLOOD SUGAR ONE TIME DAILY   Accu-Chek Softclix Lancets lancets TEST BLOOD SUGAR ONE TIME DAILY   acetaminophen  (TYLENOL ) 500 MG tablet Take 1 tablet (500 mg total) by mouth every 6 (six) hours as needed.   Alcohol  Swabs (B-D SINGLE USE SWABS REGULAR) PADS USE TO CHECK BLOOD SUGAR DAILY   Blood Glucose Calibration (SURECHEK CONTROL SOLUTION) Normal LIQD Use to check blood sugar daily. Dx E11.65   Blood Glucose Monitoring Suppl (ACCU-CHEK AVIVA PLUS) w/Device KIT USE AS DIRECTED   cyclobenzaprine  (FLEXERIL ) 5 MG tablet Take 1 tablet (5 mg total) by mouth 2 (two) times daily as needed for muscle spasms.   diclofenac  Sodium (VOLTAREN ) 1 % GEL Apply 2 g topically 4 (four) times daily as needed.   famotidine  (PEPCID ) 40 MG tablet Take 1 tablet (40 mg total) by mouth daily.   flecainide  (  TAMBOCOR ) 100 MG tablet TAKE 1/2 TABLET TWICE DAILY (PLEASE KEEP UPCOMING APPT FOR FUTURE REFILLS)   fluticasone  (FLONASE ) 50 MCG/ACT nasal spray Place 2 sprays into both nostrils daily.   glipiZIDE  (GLUCOTROL ) 10 MG tablet TAKE 1 TABLET TWICE DAILY BEFORE MEALS   hydrALAZINE  (APRESOLINE ) 50 MG tablet Take 1 tablet (50 mg total) by mouth 3 (three) times daily.   irbesartan  (AVAPRO ) 300 MG tablet Take 1 tablet (300 mg total) by mouth daily.   lidocaine  (LIDODERM ) 5 % Place 1 patch  onto the skin daily. Remove & Discard patch within 12 hours or as directed by MD   meclizine  (ANTIVERT ) 25 MG tablet TAKE 1 TABLET TWICE DAILY AS NEEDED FOR DIZZINESS   nystatin  cream (MYCOSTATIN ) APPLY 1 APPLICATION TOPICALLY 2 (TWO) TIMES DAILY.   polyethylene glycol powder (GLYCOLAX /MIRALAX ) 17 GM/SCOOP powder Take 17 g by mouth 2 (two) times daily as needed.   polyethylene glycol powder (GLYCOLAX /MIRALAX ) powder Take 17 g by mouth 2 (two) times daily as needed.   rivaroxaban  (XARELTO ) 20 MG TABS tablet TAKE 1 TABLET(20 MG) BY MOUTH DAILY WITH SUPPER   simvastatin  (ZOCOR ) 20 MG tablet Take 1 tablet (20 mg total) by mouth daily at 6 PM.   traMADol  (ULTRAM ) 50 MG tablet Take 1 tablet (50 mg total) by mouth every 12 (twelve) hours as needed for severe pain (pain score 7-10).   No facility-administered encounter medications on file as of 08/01/2023.    Allergies (verified) Codeine   History: Past Medical History:  Diagnosis Date   Anemia    Arthritis    Bradycardia    Cataract    Chronic kidney disease    Diabetes mellitus type II    Hypertension    Osteoporosis    Syncope    Weakness    Past Surgical History:  Procedure Laterality Date   ABDOMINAL HYSTERECTOMY     CESAREAN SECTION     COLON SURGERY     EYE SURGERY     FRACTURE SURGERY     Family History  Problem Relation Age of Onset   Diabetes Sister    Hyperlipidemia Sister    Hypertension Sister    Diabetes Brother    Hyperlipidemia Brother    Hypertension Brother    Social History   Socioeconomic History   Marital status: Widowed    Spouse name: Not on file   Number of children: Not on file   Years of education: Not on file   Highest education level: Not on file  Occupational History   Not on file  Tobacco Use   Smoking status: Never   Smokeless tobacco: Never  Vaping Use   Vaping status: Never Used  Substance and Sexual Activity   Alcohol  use: No   Drug use: No   Sexual activity: Not Currently   Other Topics Concern   Not on file  Social History Narrative   ambudexterty   No caffeine   Two story home    Social Drivers of Health   Financial Resource Strain: Low Risk  (08/01/2023)   Overall Financial Resource Strain (CARDIA)    Difficulty of Paying Living Expenses: Not hard at all  Food Insecurity: Food Insecurity Present (08/01/2023)   Hunger Vital Sign    Worried About Running Out of Food in the Last Year: Sometimes true    Ran Out of Food in the Last Year: Sometimes true  Transportation Needs: Unmet Transportation Needs (08/01/2023)   PRAPARE - Transportation    Lack of  Transportation (Medical): Yes    Lack of Transportation (Non-Medical): Yes  Physical Activity: Inactive (08/01/2023)   Exercise Vital Sign    Days of Exercise per Week: 0 days    Minutes of Exercise per Session: 0 min  Stress: No Stress Concern Present (08/01/2023)   [employer] of Occupational Health - Occupational Stress Questionnaire    Feeling of Stress : Not at all  Social Connections: Moderately Integrated (08/01/2023)   Social Connection and Isolation Panel [NHANES]    Frequency of Communication with Friends and Family: More than three times a week    Frequency of Social Gatherings with Friends and Family: More than three times a week    Attends Religious Services: More than 4 times per year    Active Member of [employer] or Organizations: Yes    Attends Banker Meetings: More than 4 times per year    Marital Status: Widowed    Tobacco Counseling Counseling given: Yes   Clinical Intake:  Pre-visit preparation completed: Yes  Pain : No/denies pain Pain Score: 0-No pain     BMI - recorded: 35.9 Nutritional Status: BMI > 30  Obese Nutritional Risks: None Diabetes: Yes (telehealth visit. unable to obtain cbg) CBG done?: No (per patient CBG this morning was 86 fasting) Did pt. bring in CBG monitor from home?: No  How often do you need to have someone help you when you read  instructions, pamphlets, or other written materials from your doctor or pharmacy?: 1 - Never  Interpreter Needed?: No  Information entered by :: Sally Crazier CMA   Activities of Daily Living    08/01/2023    2:20 PM  In your present state of health, do you have any difficulty performing the following activities:  Hearing? 0  Vision? 0  Difficulty concentrating or making decisions? 0  Walking or climbing stairs? 0  Dressing or bathing? 0  Doing errands, shopping? 0  Preparing Food and eating ? N  Using the Toilet? N  In the past six months, have you accidently leaked urine? N  Do you have problems with loss of bowel control? N  Managing your Medications? N  Managing your Finances? N  Housekeeping or managing your Housekeeping? N    Patient Care Team: Meldon Sport, MD as PCP - General (Internal Medicine) Loyde Rule, MD as PCP - Cardiology (Cardiology) Secundino Dach Harvey Linen., MD as Consulting Physician (Urology) Loyde Rule, MD as Consulting Physician (Cardiology) [place] Patricia Boon [facility]) Alvis Jourdain, MD as Consulting Physician (Gastroenterology)  Indicate any recent Medical Services you may have received from other than [facility] providers in the past year (date may be approximate).     Assessment:   This is a routine wellness examination for [place].  Hearing/Vision screen Hearing Screening - Comments:: Patient denies any hearing difficulties.   Vision Screening - Comments:: Patient denies any vision difficulties and  wears reading glasses only. Sees [facility] for yearly exams.    Goals Addressed             This Visit's Progress    Patient Stated       Improve my health       Depression Screen    08/01/2023    2:20 PM 07/12/2023    1:12 PM 04/06/2023    1:20 PM 01/31/2023    1:39 PM 09/29/2022    1:36 PM 08/30/2022    3:50 PM 07/05/2022    4:11 PM  PHQ 2/9 Scores  PHQ - 2 Score 0 0 0 0 0 0 0  PHQ- 9 Score 0 0         Fall Risk    08/01/2023     2:20 PM 07/12/2023    1:12 PM 04/06/2023    1:20 PM 01/31/2023    1:39 PM 09/29/2022    1:35 PM  Fall Risk   Falls in the past year? 0 0 0 1 0  Number falls in past yr: 0 0 0 0 0  Injury with Fall? 0 0 0 0 0  Risk for fall due to : No Fall Risks No Fall Risks No Fall Risks    Follow up Falls prevention discussed;Falls evaluation completed Falls evaluation completed Falls evaluation completed      MEDICARE RISK AT HOME: Medicare Risk at Home Any stairs in or around the home?: Yes If so, are there any without handrails?: No Home free of loose throw rugs in walkways, pet beds, electrical cords, etc?: Yes Adequate lighting in your home to reduce risk of falls?: Yes Life alert?: No Use of a cane, walker or w/c?: No Grab bars in the bathroom?: No Shower chair or bench in shower?: No Elevated toilet seat or a handicapped toilet?: No  TIMED UP AND GO:  Was the test performed?  No    Cognitive Function:        08/01/2023    2:18 PM 07/05/2022    4:11 PM 03/05/2019    2:06 PM  6CIT Screen  What Year? 0 points 0 points 0 points  What month? 0 points 0 points 0 points  What time? 0 points 0 points 0 points  Count back from 20 0 points 0 points 0 points  Months in reverse 0 points 0 points 0 points  Repeat phrase 0 points 6 points 6 points  Total Score 0 points 6 points 6 points    Immunizations Immunization History  Administered Date(s) Administered   Fluad Quad(high Dose 65+) 04/12/2019, 04/01/2020, 04/29/2022   Fluad Trivalent(High Dose 65+) 04/06/2023   Influenza Split 03/31/2012, 02/20/2016   Influenza, High Dose Seasonal PF 03/06/2016, 03/20/2018   Influenza,inj,Quad PF,6+ Mos 02/20/2013, 04/11/2014, 06/09/2015, 03/24/2017   PFIZER(Purple Top)SARS-COV-2 Vaccination 10/19/2019, 11/09/2019   Pneumococcal Conjugate-13 12/03/2013   Pneumococcal Polysaccharide-23 05/01/2012   Td 11/11/2014   Zoster, Live 06/30/2012    TDAP status: Up to date  Flu Vaccine status: Up to  date  Pneumococcal vaccine status: Up to date  Covid-19 vaccine status: Information provided on how to obtain vaccines.   Qualifies for Shingles Vaccine? Yes   Zostavax completed Yes   Shingrix Completed?: No.    Education has been provided regarding the importance of this vaccine. Patient has been advised to call insurance company to determine out of pocket expense if they have not yet received this vaccine. Advised may also receive vaccine at local pharmacy or Health Dept. Verbalized acceptance and understanding.  Screening Tests Health Maintenance  Topic Date Due   Zoster Vaccines- Shingrix (1 of 2) 12/29/1959   COVID-19 Vaccine (3 - Pfizer risk series) 12/07/2019   Medicare Annual Wellness (AWV)  03/20/2023   OPHTHALMOLOGY EXAM  10/11/2023   HEMOGLOBIN A1C  01/09/2024   Diabetic kidney evaluation - eGFR measurement  07/11/2024   Diabetic kidney evaluation - Urine ACR  07/11/2024   FOOT EXAM  07/11/2024   DTaP/Tdap/Td (2 - Tdap) 11/10/2024   Pneumonia Vaccine 10+ Years old  Completed   INFLUENZA VACCINE  Completed  DEXA SCAN  Completed   HPV VACCINES  Aged Out    Health Maintenance  Health Maintenance Due  Topic Date Due   Zoster Vaccines- Shingrix (1 of 2) 12/29/1959   COVID-19 Vaccine (3 - Pfizer risk series) 12/07/2019   Medicare Annual Wellness (AWV)  03/20/2023    Colorectal cancer screening: No longer required.   Mammogram status: No longer required due to ag3.  Bone Density Screening: Not age appropriate for this patient.   Lung Cancer Screening: (Low Dose CT Chest recommended if Age 41-80 years, 20 pack-year currently smoking OR have quit w/in 15years.) does not qualify.   Lung Cancer Screening Referral: na  Additional Screening:  Hepatitis C Screening: does not qualify  Vision Screening: Recommended annual ophthalmology exams for early detection of glaucoma and other disorders of the eye. Is the patient up to date with their annual eye exam?  Yes  Who  is the provider or what is the name of the office in which the patient attends annual eye exams? [facility]  Dental Screening: Recommended annual dental exams for proper oral hygiene  Diabetic Foot Exam: Diabetic Foot Exam: Completed 07/12/2023  Community Resource Referral / Chronic Care Management: CRR required this visit?  Yes   CCM required this visit?  No     Plan:     I have personally reviewed and noted the following in the patient's chart:   Medical and social history Use of alcohol , tobacco or illicit drugs  Current medications and supplements including opioid prescriptions. Patient is not currently taking opioid prescriptions. Functional ability and status Nutritional status Physical activity Advanced directives List of other physicians Hospitalizations, surgeries, and ER visits in previous 12 months Vitals Screenings to include cognitive, depression, and falls Referrals and appointments  In addition, I have reviewed and discussed with patient certain preventive protocols, quality metrics, and best practice recommendations. A written personalized care plan for preventive services as well as general preventive health recommendations were provided to patient.     Sallye Crease Corita Allinson, CMA   08/01/2023   After Visit Summary: (Mail) Due to this being a telephonic visit, the after visit summary with patients personalized plan was offered to patient via mail   Nurse Notes: see routing comment

## 2023-08-01 NOTE — Patient Instructions (Signed)
 [name] , Thank you for taking time to come for your Medicare Wellness Visit. I appreciate your ongoing commitment to your health goals. Please review the following plan we discussed and let me know if I can assist you in the future.   Referrals/Orders/Follow-Ups/Clinician Recommendations:  Next Medicare Annual Wellness Visit:August 01, 2024 at 1:10 pm   A referral was placed for you today for community resources. This will check to see what community resources are available for you for anything we discussed during your wellness visit today that you may be having difficulty with such a obtaining food, paying for utilities, or transportation needs. Someone will contact you in a few days. If you haven't heard from someone within the next month, please call the number below  [facility] Concierge Line  [phone]   This is a list of the screening recommended for you and due dates:  Health Maintenance  Topic Date Due   Zoster (Shingles) Vaccine (1 of 2) 12/29/1959   COVID-19 Vaccine (3 - Pfizer risk series) 12/07/2019   Eye exam for diabetics  10/11/2023   Hemoglobin A1C  01/09/2024   Yearly kidney function blood test for diabetes  07/11/2024   Yearly kidney health urinalysis for diabetes  07/11/2024   Complete foot exam   07/11/2024   Medicare Annual Wellness Visit  07/31/2024   DTaP/Tdap/Td vaccine (2 - Tdap) 11/10/2024   Pneumonia Vaccine  Completed   Flu Shot  Completed   DEXA scan (bone density measurement)  Completed   HPV Vaccine  Aged Out    Advanced directives: (Declined) Advance directive discussed with you today. Even though you declined this today, please call our office should you change your mind, and we can give you the proper paperwork for you to fill out.  Next Medicare Annual Wellness Visit scheduled for next year: yes  Preventive Care 85 Years and Older, Female Preventive care refers to lifestyle choices and visits with your health care provider that can promote health  and wellness. Preventive care visits are also called wellness exams. What can I expect for my preventive care visit? Counseling Your health care provider may ask you questions about your: Medical history, including: Past medical problems. Family medical history. Pregnancy and menstrual history. History of falls. Current health, including: Memory and ability to understand (cognition). Emotional well-being. Home life and relationship well-being. Sexual activity and sexual health. Lifestyle, including: Alcohol , nicotine or tobacco, and drug use. Access to firearms. Diet, exercise, and sleep habits. Work and work Astronomer. Sunscreen use. Safety issues such as seatbelt and bike helmet use. Physical exam Your health care provider will check your: Height and weight. These may be used to calculate your BMI (body mass index). BMI is a measurement that tells if you are at a healthy weight. Waist circumference. This measures the distance around your waistline. This measurement also tells if you are at a healthy weight and may help predict your risk of certain diseases, such as type 2 diabetes and high blood pressure. Heart rate and blood pressure. Body temperature. Skin for abnormal spots. What immunizations do I need?  Vaccines are usually given at various ages, according to a schedule. Your health care provider will recommend vaccines for you based on your age, medical history, and lifestyle or other factors, such as travel or where you work. What tests do I need? Screening Your health care provider may recommend screening tests for certain conditions. This may include: Lipid and cholesterol levels. Hepatitis C test. Hepatitis B test.  HIV (human immunodeficiency virus) test. STI (sexually transmitted infection) testing, if you are at risk. Lung cancer screening. Colorectal cancer screening. Diabetes screening. This is done by checking your blood sugar (glucose) after you have not  eaten for a while (fasting). Mammogram. Talk with your health care provider about how often you should have regular mammograms. BRCA-related cancer screening. This may be done if you have a family history of breast, ovarian, tubal, or peritoneal cancers. Bone density scan. This is done to screen for osteoporosis. Talk with your health care provider about your test results, treatment options, and if necessary, the need for more tests. Follow these instructions at home: Eating and drinking  Eat a diet that includes fresh fruits and vegetables, whole grains, lean protein, and low-fat dairy products. Limit your intake of foods with high amounts of sugar, saturated fats, and salt. Take vitamin and mineral supplements as recommended by your health care provider. Do not drink alcohol  if your health care provider tells you not to drink. If you drink alcohol : Limit how much you have to 0-1 drink a day. Know how much alcohol  is in your drink. In the [place] one drink equals one 12 oz bottle of beer (355 mL), one 5 oz glass of wine (148 mL), or one 1 oz glass of hard liquor (44 mL). Lifestyle Brush your teeth every morning and night with fluoride toothpaste. Floss one time each day. Exercise for at least 30 minutes 5 or more days each week. Do not use any products that contain nicotine or tobacco. These products include cigarettes, chewing tobacco, and vaping devices, such as e-cigarettes. If you need help quitting, ask your health care provider. Do not use drugs. If you are sexually active, practice safe sex. Use a condom or other form of protection in order to prevent STIs. Take aspirin only as told by your health care provider. Make sure that you understand how much to take and what form to take. Work with your health care provider to find out whether it is safe and beneficial for you to take aspirin daily. Ask your health care provider if you need to take a cholesterol-lowering medicine (statin). Find  healthy ways to manage stress, such as: Meditation, yoga, or listening to music. Journaling. Talking to a trusted person. Spending time with friends and family. Minimize exposure to UV radiation to reduce your risk of skin cancer. Safety Always wear your seat belt while driving or riding in a vehicle. Do not drive: If you have been drinking alcohol . Do not ride with someone who has been drinking. When you are tired or distracted. While texting. If you have been using any mind-altering substances or drugs. Wear a helmet and other protective equipment during sports activities. If you have firearms in your house, make sure you follow all gun safety procedures. What's next? Visit your health care provider once a year for an annual wellness visit. Ask your health care provider how often you should have your eyes and teeth checked. Stay up to date on all vaccines. This information is not intended to replace advice given to you by your health care provider. Make sure you discuss any questions you have with your health care provider. Document Revised: 12/03/2020 Document Reviewed: 12/03/2020 Elsevier Patient Education  2024 [employer].  Understanding Your Risk for Falls Millions of people have serious injuries from falls each year. It is important to understand your risk of falling. Talk with your health care provider about your risk and what you  can do to lower it. If you do have a serious fall, make sure to tell your provider. Falling once raises your risk of falling again. How can falls affect me? Serious injuries from falls are common. These include: Broken bones, such as hip fractures. Head injuries, such as traumatic brain injuries (TBI) or concussions. A fear of falling can cause you to avoid activities and stay at home. This can make your muscles weaker and raise your risk for a fall. What can increase my risk? There are a number of risk factors that increase your risk for falling.  The more risk factors you have, the higher your risk of falling. Serious injuries from a fall happen most often to people who are older than 85 years old. Teenagers and young adults ages 29-29 are also at higher risk. Common risk factors include: Weakness in the lower body. Being generally weak or confused due to long-term (chronic) illness. Dizziness or balance problems. Poor vision. Medicines that cause dizziness or drowsiness. These may include: Medicines for your blood pressure, heart, anxiety, insomnia, or swelling (edema). Pain medicines. Muscle relaxants. Other risk factors include: Drinking alcohol . Having had a fall in the past. Having foot pain or wearing improper footwear. Working at a dangerous job. Having any of the following in your home: Tripping hazards, such as floor clutter or loose rugs. Poor lighting. Pets. Having dementia or memory loss. What actions can I take to lower my risk of falling?     Physical activity Stay physically fit. Do strength and balance exercises. Consider taking a regular class to build strength and balance. Yoga and tai chi are good options. Vision Have your eyes checked every year and your prescription for glasses or contacts updated as needed. Shoes and walking aids Wear non-skid shoes. Wear shoes that have rubber soles and low heels. Do not wear high heels. Do not walk around the house in socks or slippers. Use a cane or walker as told by your provider. Home safety Attach secure railings on both sides of your stairs. Install grab bars for your bathtub, shower, and toilet. Use a non-skid mat in your bathtub or shower. Attach bath mats securely with double-sided, non-slip rug tape. Use good lighting in all rooms. Keep a flashlight near your bed. Make sure there is a clear path from your bed to the bathroom. Use night-lights. Do not use throw rugs. Make sure all carpeting is taped or tacked down securely. Remove all clutter from  walkways and stairways, including extension cords. Repair uneven or broken steps and floors. Avoid walking on icy or slippery surfaces. Walk on the grass instead of on icy or slick sidewalks. Use ice melter to get rid of ice on walkways in the winter. Use a cordless phone. Questions to ask your health care provider Can you help me check my risk for a fall? Do any of my medicines make me more likely to fall? Should I take a vitamin D  supplement? What exercises can I do to improve my strength and balance? Should I make an appointment to have my vision checked? Do I need a bone density test to check for weak bones (osteoporosis)? Would it help to use a cane or a walker? Where to find more information Centers for Disease Control and Prevention, STEADI: TonerPromos.no Community-Based Fall Prevention Programs: TonerPromos.no [employer] on Aging: BaseRingTones.pl Contact a health care provider if: You fall at home. You are afraid of falling at home. You feel weak, drowsy, or dizzy. This information  is not intended to replace advice given to you by your health care provider. Make sure you discuss any questions you have with your health care provider. Document Revised: 02/08/2022 Document Reviewed: 02/08/2022 Elsevier Patient Education  2024 [employer].

## 2023-08-02 ENCOUNTER — Telehealth: Payer: Self-pay | Admitting: *Deleted

## 2023-08-02 NOTE — Progress Notes (Unsigned)
Complex Care Management Note Care Guide Note  08/02/2023 Name: GIRTRUDE ENSLIN MRN: [id number] DOB: July 18, 1940   Complex Care Management Outreach Attempts: An unsuccessful telephone outreach was attempted today to offer the patient information about available complex care management services.  Follow Up Plan:  Additional outreach attempts will be made to offer the patient complex care management information and services.   Encounter Outcome:  No Answer  Gwenevere Ghazi  [facility] Health  [facility], [facility] Guide  Direct Dial: [phone]  Fax [phone]

## 2023-08-05 NOTE — Progress Notes (Signed)
Complex Care Management Note Care Guide Note  08/05/2023 Name: Catherine Joseph MRN: [id number] DOB: 1940/09/25   Complex Care Management Outreach Attempts: A second unsuccessful outreach was attempted today to offer the patient with information about available complex care management services.  Follow Up Plan:  Additional outreach attempts will be made to offer the patient complex care management information and services.   Encounter Outcome:  No Answer  Gwenevere Ghazi  [facility] Health  [facility], [facility] Guide  Direct Dial: [phone]  Fax [phone]

## 2023-08-05 NOTE — Progress Notes (Signed)
Complex Care Management Note  Care Guide Note 08/05/2023 Name: Catherine Joseph MRN: [id number] DOB: 07/20/1940  Catherine Joseph is a 83 y.o. year old female who sees Anabel Halon, MD for primary care. I reached out to Vista Deck by phone today to offer complex care management services.  [name] was given information about Complex Care Management services today including:   The Complex Care Management services include support from the care team which includes your Nurse Care Manager, Clinical Social Worker, or Pharmacist.  The Complex Care Management team is here to help remove barriers to the health concerns and goals most important to you. Complex Care Management services are voluntary, and the patient may decline or stop services at any time by request to their care team member.   Complex Care Management Consent Status: Patient agreed to services and verbal consent obtained.   Follow up plan:  Telephone appointment with complex care management team member scheduled for:  2/24  Encounter Outcome:  Patient Scheduled  Gwenevere Ghazi  [facility] Health  [facility], [facility] Guide  Direct Dial: [phone]  Fax [phone]

## 2023-08-15 ENCOUNTER — Ambulatory Visit: Payer: Self-pay

## 2023-08-15 NOTE — Patient Outreach (Signed)
 Care Coordination   08/15/2023 Name: Catherine Joseph MRN: [id number] DOB: 1941/04/15   Care Coordination Outreach Attempts:  An unsuccessful outreach was attempted for an appointment today.  Follow Up Plan:  Additional outreach attempts will be made to offer the patient complex care management information and services.   Encounter Outcome:  No Answer   Care Coordination Interventions:  No, not indicated    Lysle Morales, BSW [place]  [facility], [facility] Social Worker Direct Dial: [phone]  Fax: [phone] Website: [email or web address]

## 2023-08-17 ENCOUNTER — Telehealth: Payer: Self-pay | Admitting: *Deleted

## 2023-08-17 NOTE — Progress Notes (Signed)
 Complex Care Management Care Guide Note  08/17/2023 Name: Catherine Joseph MRN: [id number] DOB: 26-Jun-1940  Catherine Joseph is a 83 y.o. year old female who is a primary care patient of Anabel Halon, MD and is actively engaged with the care management team. I reached out to Vista Deck by phone today to assist with re-scheduling  with the BSW.  Follow up plan: Unsuccessful telephone outreach attempt made.   Gwenevere Ghazi  [facility] Health  [facility], [facility] Guide  Direct Dial: [phone]  Fax [phone]

## 2023-08-22 NOTE — Progress Notes (Signed)
 Complex Care Management Care Guide Note  08/22/2023 Name: Catherine Joseph MRN: [id number] DOB: [birth year]  Catherine Joseph is a 83 y.o. year old female who is a primary care patient of Anabel Halon, MD and is actively engaged with the care management team. I reached out to Vista Deck by phone today to assist with re-scheduling  with the BSW.  Follow up plan: Unsuccessful telephone outreach attempt made. A HIPAA compliant phone message was left for the patient providing contact information and requesting a return call.No further outreach attempts will be made at this time. We have been unable to contact the patient to reschedule for complex care management services.   Gwenevere Ghazi  [facility] Health  [facility], [facility] Guide  Direct Dial: [phone]  Fax [phone]

## 2023-09-01 ENCOUNTER — Ambulatory Visit (INDEPENDENT_AMBULATORY_CARE_PROVIDER_SITE_OTHER): Admitting: Family Medicine

## 2023-09-01 ENCOUNTER — Encounter: Payer: Self-pay | Admitting: Family Medicine

## 2023-09-01 ENCOUNTER — Other Ambulatory Visit: Payer: Self-pay | Admitting: Internal Medicine

## 2023-09-01 VITALS — BP 120/62 | HR 88 | Temp 97.0°F | Ht 61.0 in | Wt 192.0 lb

## 2023-09-01 DIAGNOSIS — L989 Disorder of the skin and subcutaneous tissue, unspecified: Secondary | ICD-10-CM | POA: Insufficient documentation

## 2023-09-01 DIAGNOSIS — I10 Essential (primary) hypertension: Secondary | ICD-10-CM

## 2023-09-01 MED ORDER — VALACYCLOVIR HCL 1 G PO TABS: 1000.0000 mg | ORAL_TABLET | Freq: Two times a day (BID) | ORAL | 0 refills | Status: DC

## 2023-09-01 MED ORDER — MUPIROCIN 2 % EX OINT: 1.0000 | TOPICAL_OINTMENT | Freq: Three times a day (TID) | CUTANEOUS | 0 refills | Status: AC

## 2023-09-01 NOTE — Progress Notes (Signed)
 Subjective:  Patient ID: Catherine Joseph, female    DOB: 1941/05/31  Age: 83 y.o. MRN: [id number]  CC:   Chief Complaint  Patient presents with   bumps on lower jaw area    Started in early February 2025 - itching first ocurrence    HPI:  83 year old female presents for evaluation of the above.  Started in February. She noted raised bumps below the lower lip on the right side. No pain. Mild itching. No relief with Vaseline. She reports that she was around someone at church with similar facial lesions. No fever. No other associated symptoms. No other complaints.  Patient Active Problem List   Diagnosis Date Noted   Lesion of face 09/01/2023   Gastroesophageal reflux disease 04/06/2023   Encounter for general adult medical examination with abnormal findings 04/06/2023   Stage 3a chronic kidney disease (HCC) 09/29/2022   Neural foraminal stenosis of lumbar spine 09/29/2022   Osteopenia 04/01/2016   Atrial fibrillation (HCC) 04/22/2014   Lung nodule 02/01/2012   Mixed hyperlipidemia 08/03/2011   Hypertension 07/22/2011   Type 2 diabetes mellitus with other specified complication (HCC) 07/22/2011    Social Hx   Social History   Socioeconomic History   Marital status: Widowed    Spouse name: Not on file   Number of children: Not on file   Years of education: Not on file   Highest education level: Not on file  Occupational History   Not on file  Tobacco Use   Smoking status: Never   Smokeless tobacco: Never  Vaping Use   Vaping status: Never Used  Substance and Sexual Activity   Alcohol use: No   Drug use: No   Sexual activity: Not Currently  Other Topics Concern   Not on file  Social History Narrative   ambudexterty   No caffeine   Two story home    Social Drivers of Corporate investment banker Strain: Low Risk  (08/01/2023)   Overall Financial Resource Strain (CARDIA)    Difficulty of Paying Living Expenses: Not hard at all  Food Insecurity: Food  Insecurity Present (08/01/2023)   Hunger Vital Sign    Worried About Running Out of Food in the Last Year: Sometimes true    Ran Out of Food in the Last Year: Sometimes true  Transportation Needs: Unmet Transportation Needs (08/01/2023)   PRAPARE - Transportation    Lack of Transportation (Medical): Yes    Lack of Transportation (Non-Medical): Yes  Physical Activity: Inactive (08/01/2023)   Exercise Vital Sign    Days of Exercise per Week: 0 days    Minutes of Exercise per Session: 0 min  Stress: No Stress Concern Present (08/01/2023)   [employer] of Occupational Health - Occupational Stress Questionnaire    Feeling of Stress : Not at all  Social Connections: Moderately Integrated (08/01/2023)   Social Connection and Isolation Panel [NHANES]    Frequency of Communication with Friends and Family: More than three times a week    Frequency of Social Gatherings with Friends and Family: More than three times a week    Attends Religious Services: More than 4 times per year    Active Member of [employer] or Organizations: Yes    Attends Banker Meetings: More than 4 times per year    Marital Status: Widowed    Review of Systems Per HPI  Objective:  BP 120/62   Pulse 88   Temp (!) 97 F (36.1 C)  Ht 5\' 1"  (1.549 m)   Wt 192 lb (87.1 kg)   SpO2 99%   BMI 36.28 kg/m      09/01/2023    1:56 PM 08/01/2023    2:15 PM 07/12/2023    2:04 PM  BP/Weight  Systolic BP 120 166 148  Diastolic BP 62 80 86  Wt. (Lbs) 192 190   BMI 36.28 kg/m2 35.9 kg/m2     Physical Exam Constitutional:      General: She is not in acute distress.    Appearance: Normal appearance.  HENT:     Head: Normocephalic and atraumatic.      Comments: Labeled location with several raised papules (? Vesicles) Neurological:     Mental Status: She is alert.     Lab Results  Component Value Date   WBC 3.6 07/12/2023   HGB 13.6 07/12/2023   HCT 42.8 07/12/2023   PLT 170 07/12/2023   GLUCOSE 74  07/12/2023   CHOL 188 07/12/2023   TRIG 77 07/12/2023   HDL 61 07/12/2023   LDLDIRECT 92 08/08/2017   LDLCALC 113 (H) 07/12/2023   ALT 14 07/12/2023   AST 20 07/12/2023   NA 144 07/12/2023   K 4.2 07/12/2023   CL 104 07/12/2023   CREATININE 1.12 (H) 07/12/2023   BUN 15 07/12/2023   CO2 20 07/12/2023   TSH 1.030 07/12/2023   INR 2.8 03/17/2015   HGBA1C 6.6 (H) 07/12/2023   MICROALBUR 2.0 08/28/2015     Assessment & Plan:  Lesion of face Assessment & Plan: Etiology and prognosis unclear at this time. Culture and HSV PCR sent. Empiric Valtrex and Mupirocin.   Orders: -     HSV DNA by PCR (reference lab) -     Anaerobic and Aerobic Culture  Other orders -     valACYclovir HCl; Take 1 tablet (1,000 mg total) by mouth 2 (two) times daily.  Dispense: 14 tablet; Refill: 0 -     Mupirocin; Apply 1 Application topically 3 (three) times daily for 7 days.  Dispense: 30 g; Refill: 0    Follow-up:  Return if symptoms worsen or fail to improve.  Everlene Other DO [facility]

## 2023-09-01 NOTE — Patient Instructions (Signed)
 We will call with results.  Medications as prescribed.  Take care  Dr. Adriana Simas

## 2023-09-01 NOTE — Assessment & Plan Note (Addendum)
 Etiology and prognosis unclear at this time. Culture and HSV PCR sent. Empiric Valtrex and Mupirocin.

## 2023-09-03 LAB — SPECIMEN STATUS REPORT

## 2023-09-03 LAB — HSV DNA BY PCR (REFERENCE LAB)
HSV 2 DNA: NEGATIVE
HSV-1 DNA: NEGATIVE

## 2023-09-06 ENCOUNTER — Other Ambulatory Visit: Payer: Self-pay | Admitting: Family Medicine

## 2023-09-06 LAB — ANAEROBIC AND AEROBIC CULTURE

## 2023-09-06 LAB — SPECIMEN STATUS REPORT

## 2023-09-06 MED ORDER — CEPHALEXIN 500 MG PO CAPS: 500.0000 mg | ORAL_CAPSULE | Freq: Four times a day (QID) | ORAL | 0 refills | Status: AC

## 2023-09-07 ENCOUNTER — Ambulatory Visit: Payer: Self-pay | Admitting: Internal Medicine

## 2023-09-07 NOTE — Telephone Encounter (Signed)
 Copied from CRM [id number]. Topic: Clinical - Lab/Test Results >> Sep 07, 2023 10:04 AM [place] B wrote: Reason for CRM: pt called in for lab results   Patient calling in for lab results. I went over the lab results with the patient and advised her that a prescription for Keflex was sent to [facility]. Patient understood and had no further questions at this time.    Reason for Disposition  Caller requesting lab results  (Exception: Routine or non-urgent lab result.)  Answer Assessment - Initial Assessment Questions 1. REASON FOR CALL or QUESTION: "What is your reason for calling today?" or "How can I best help you?" or "What question do you have that I can help answer?"     Calling for lab results  2. CALLER: Document the source of call. (e.g., laboratory, patient).     Patient  Protocols used: PCP Call - No Triage-A-AH

## 2023-09-28 NOTE — Progress Notes (Signed)
 Patient ID: Catherine Joseph, female   DOB: March 14, 1941, 83 y.o.   MRN: [id number]      83 y.o.  [place] female history of PAF on flecainide . Has been on coumadin  and now xarelto  for anticoagulationHistory of atypical chest pain with normal stress echo 2013 and normal myovue 03/21/14   Lives with daughter/husband and 3 grand children ages 86-18 years Originally from [place] and lived in [place] No longer traveling to [place] [address] Had two sisters die of cancer 2023    Still has nephews in [place] Lots of arthritis limits her mobility  No palpitations dyspnea or chest pain No bleeding issues Dr [facility]  checks her lab work   Has some chronic vertigo and BPPV  No palpitations or bleeding issues   Seen in ED 07/18/20 with right flank/back pain negative w/u including CT abdomen and negative UA  Had some lesions on her right sided jaw Rx with antibiotics HSV negative ? Positive culture for Klebsiella   Has not been taking Tambacor or Xarelto  Having PACls in office Discussed need for compliance Will try to get Patient assistance for xarelto    ROS: Denies fever, malais, weight loss, blurry vision, decreased visual acuity, cough, sputum, SOB, hemoptysis, pleuritic pain, palpitaitons, heartburn, abdominal pain, melena, lower extremity edema, claudication, or rash.  All other systems reviewed and negative  General: Ht 5\' 6"  (1.676 m)   Wt 193 lb (87.5 kg)   BMI 31.15 kg/m  Affect appropriate Healthy:  appears stated age HEENT: normal Neck supple with no adenopathy JVP normal no bruits no thyromegaly Lungs clear with no wheezing and good diaphragmatic motion Heart:  S1/S2 no murmur, no rub, gallop or click PMI normal Abdomen: benighn, BS positve, no tenderness, no AAA no bruit.  No HSM or HJR Distal pulses intact with no bruits No edema Neuro non-focal Skin warm and dry No muscular weakness    Current Outpatient Medications  Medication Sig Dispense Refill    ACCU-CHEK AVIVA PLUS test strip TEST BLOOD SUGAR ONE TIME DAILY 100 strip 1   Accu-Chek Softclix Lancets lancets TEST BLOOD SUGAR ONE TIME DAILY 100 each 3   acetaminophen  (TYLENOL ) 500 MG tablet Take 1 tablet (500 mg total) by mouth every 6 (six) hours as needed. 30 tablet 0   Alcohol  Swabs (B-D SINGLE USE SWABS REGULAR) PADS USE TO CHECK BLOOD SUGAR DAILY 100 each 3   Blood Glucose Calibration (SURECHEK CONTROL SOLUTION) Normal LIQD Use to check blood sugar daily. Dx E11.65 1 each 3   Blood Glucose Monitoring Suppl (ACCU-CHEK AVIVA PLUS) w/Device KIT USE AS DIRECTED 1 kit 5   cyclobenzaprine  (FLEXERIL ) 5 MG tablet Take 1 tablet (5 mg total) by mouth 2 (two) times daily as needed for muscle spasms. 30 tablet 1   diclofenac  Sodium (VOLTAREN ) 1 % GEL Apply 2 g topically 4 (four) times daily as needed. 50 g 0   famotidine  (PEPCID ) 40 MG tablet Take 1 tablet (40 mg total) by mouth daily. 90 tablet 1   fluticasone  (FLONASE ) 50 MCG/ACT nasal spray Place 2 sprays into both nostrils daily. 16 g 0   glipiZIDE  (GLUCOTROL ) 10 MG tablet TAKE 1 TABLET TWICE DAILY BEFORE MEALS 180 tablet 3   hydrALAZINE  (APRESOLINE ) 50 MG tablet Take 1 tablet (50 mg total) by mouth 3 (three) times daily. 270 tablet 1   irbesartan  (AVAPRO ) 300 MG tablet TAKE 1 TABLET EVERY DAY 90 tablet 3   lidocaine  (LIDODERM ) 5 % Place 1 patch onto the  skin daily. Remove & Discard patch within 12 hours or as directed by MD 30 patch 0   meclizine  (ANTIVERT ) 25 MG tablet TAKE 1 TABLET TWICE DAILY AS NEEDED FOR DIZZINESS 60 tablet 3   nystatin  cream (MYCOSTATIN ) APPLY 1 APPLICATION TOPICALLY 2 (TWO) TIMES DAILY. 30 g 11   polyethylene glycol powder (GLYCOLAX /MIRALAX ) 17 GM/SCOOP powder Take 17 g by mouth 2 (two) times daily as needed. 255 g 3   polyethylene glycol powder (GLYCOLAX /MIRALAX ) powder Take 17 g by mouth 2 (two) times daily as needed. 500 g 1   simvastatin  (ZOCOR ) 20 MG tablet Take 1 tablet (20 mg total) by mouth daily at 6 PM. 90  tablet 3   traMADol  (ULTRAM ) 50 MG tablet Take 1 tablet (50 mg total) by mouth every 12 (twelve) hours as needed for severe pain (pain score 7-10). 20 tablet 0   valACYclovir  (VALTREX ) 1000 MG tablet Take 1 tablet (1,000 mg total) by mouth 2 (two) times daily. 14 tablet 0   flecainide  (TAMBOCOR ) 100 MG tablet TAKE 1/2 TABLET TWICE DAILY (PLEASE KEEP UPCOMING APPT FOR FUTURE REFILLS) (Patient not taking: Reported on 10/10/2023) 90 tablet 1   rivaroxaban  (XARELTO ) 20 MG TABS tablet TAKE 1 TABLET(20 MG) BY MOUTH DAILY WITH SUPPER (Patient not taking: Reported on 10/10/2023) 90 tablet 1   No current facility-administered medications for this visit.    Allergies  Codeine  Electrocardiogram:   10/10/23 SR rate 81 PACls no acute changes   Assessment and Plan  PAF: Sinus but frequent PAC;s off Tambacor Script called back in Filled out patient assistance paperwork for xarelto   HTN:  Well controlled.  Continue current medications and low sodium Dash type diet.    Chol:  Continue statin LDL 85 05/24/19   DM:  Discussed low carb diet.  Target hemoglobin A1c is 6.5 or less.  Continue current medications. Lab Results  Component Value Date   HGBA1C 6.6 (H) 07/12/2023    F/U with me in a year   Janelle Mediate

## 2023-10-10 ENCOUNTER — Other Ambulatory Visit: Payer: Self-pay

## 2023-10-10 ENCOUNTER — Ambulatory Visit: Payer: Medicare (Managed Care) | Attending: Cardiovascular Disease | Admitting: Cardiovascular Disease

## 2023-10-10 ENCOUNTER — Encounter: Payer: Self-pay | Admitting: Cardiovascular Disease

## 2023-10-10 VITALS — BP 134/64 | Ht 66.0 in | Wt 193.0 lb

## 2023-10-10 DIAGNOSIS — Z7901 Long term (current) use of anticoagulants: Secondary | ICD-10-CM

## 2023-10-10 DIAGNOSIS — I10 Essential (primary) hypertension: Secondary | ICD-10-CM | POA: Diagnosis not present

## 2023-10-10 DIAGNOSIS — I480 Paroxysmal atrial fibrillation: Secondary | ICD-10-CM | POA: Diagnosis not present

## 2023-10-10 MED ORDER — FLECAINIDE ACETATE 100 MG PO TABS: 50.0000 mg | ORAL_TABLET | Freq: Two times a day (BID) | ORAL | 3 refills | Status: DC

## 2023-10-10 MED ORDER — RIVAROXABAN 20 MG PO TABS: ORAL_TABLET | ORAL | 1 refills | Status: DC

## 2023-10-10 NOTE — Patient Instructions (Signed)
 Medication Instructions:  Your physician recommends that you continue on your current medications as directed. Please refer to the Current Medication list given to you today.  Restart Flecainide  1/2 Tablet ( 50 mg ) Two Times Daily   *If you need a refill on your cardiac medications before your next appointment, please call your pharmacy*  Lab Work: NONE   If you have labs (blood work) drawn today and your tests are completely normal, you will receive your results only by: MyChart Message (if you have MyChart) OR A paper copy in the mail If you have any lab test that is abnormal or we need to change your treatment, we will call you to review the results.  Testing/Procedures: NONE   Follow-Up: At [facility], you and your health needs are our priority.  As part of our continuing mission to provide you with exceptional heart care, our providers are all part of one team.  This team includes your primary Cardiologist (physician) and Advanced Practice Providers or APPs (Physician Assistants and Nurse Practitioners) who all work together to provide you with the care you need, when you need it.  Your next appointment:   1 year(s)  Provider:   You may see Janelle Mediate, MD or one of the following Advanced Practice Providers on your designated Care Team:   Woodfin Hays, PA-C  [place], [place] Theotis Flake, [place]     We recommend signing up for the patient portal called "MyChart".  Sign up information is provided on this After Visit Summary.  MyChart is used to connect with patients for Virtual Visits (Telemedicine).  Patients are able to view lab/test results, encounter notes, upcoming appointments, etc.  Non-urgent messages can be sent to your provider as well.   To learn more about what you can do with MyChart, go to [email or web address].   Other Instructions Thank you for choosing [facility]!

## 2023-10-10 NOTE — Telephone Encounter (Signed)
 Prescription refill request for Xarelto  received.  Indication: Afib  Last office visit: 10/10/23 Catherine Joseph)  Weight: 87.5kg Age: 83 Scr: 1.12 (07/12/23)  CrCl: 51.42ml/min  Appropriate dose. Refill sent.

## 2023-10-12 ENCOUNTER — Other Ambulatory Visit (HOSPITAL_COMMUNITY): Payer: Self-pay

## 2023-10-12 ENCOUNTER — Telehealth: Payer: Self-pay | Admitting: Pharmacy Technician

## 2023-10-12 ENCOUNTER — Ambulatory Visit: Payer: Medicare (Managed Care) | Admitting: Internal Medicine

## 2023-10-12 ENCOUNTER — Encounter: Payer: Self-pay | Admitting: Internal Medicine

## 2023-10-12 VITALS — BP 125/76 | HR 76 | Ht 66.0 in | Wt 192.8 lb

## 2023-10-12 DIAGNOSIS — N1831 Chronic kidney disease, stage 3a: Secondary | ICD-10-CM | POA: Diagnosis not present

## 2023-10-12 DIAGNOSIS — M48061 Spinal stenosis, lumbar region without neurogenic claudication: Secondary | ICD-10-CM

## 2023-10-12 DIAGNOSIS — Z7984 Long term (current) use of oral hypoglycemic drugs: Secondary | ICD-10-CM

## 2023-10-12 DIAGNOSIS — I480 Paroxysmal atrial fibrillation: Secondary | ICD-10-CM

## 2023-10-12 DIAGNOSIS — E782 Mixed hyperlipidemia: Secondary | ICD-10-CM

## 2023-10-12 DIAGNOSIS — E1169 Type 2 diabetes mellitus with other specified complication: Secondary | ICD-10-CM | POA: Diagnosis not present

## 2023-10-12 DIAGNOSIS — I10 Essential (primary) hypertension: Secondary | ICD-10-CM

## 2023-10-12 MED ORDER — TRAMADOL HCL 50 MG PO TABS: 50.0000 mg | ORAL_TABLET | Freq: Two times a day (BID) | ORAL | 0 refills | Status: DC | PRN

## 2023-10-12 NOTE — Progress Notes (Signed)
 Established Patient Office Visit  Subjective:  Patient ID: Catherine Joseph, female    DOB: 1941-01-13  Age: 83 y.o. MRN: [id number]  CC:  Chief Complaint  Patient presents with   Medical Management of Chronic Issues    3 month f/u.    Back Pain    Pt reports sx have worsened with back pain.     HPI Catherine Joseph is a 83 y.o. female with past medical history of atrial fibrillation, HTN, type II DM, HLD and obesity who presents for f/u of her chronic medical conditions.  HTN and atrial fibrillation: Her BP is WNL today.  She has noticed higher BP readings at home,  but attributes it to anxiety.  She is currently taking irbesartan  300 mg QD and Hydralazine  50 mg 3 times daily. She is taking flecainide  and Xarelto  for A-fib.  She is followed by Dr. Nishan for it.  She denies any headache, dizziness, chest pain, dyspnea or, palpitations.   Type II DM: She is on glipizide  10 mg twice daily currently, but has stopped taking metformin  1000 mg once daily as a friend Doctor in [place]  told her not to take it - she denies to provide details for discussion for rationale.  Her HbA1c is stable at 6.6 now. Denies any polyuria or polyphagia currently.  She is also on simvastatin  for HLD.  Her last BMP showed GFR at 48.  She currently denies any dysuria, hematuria or urinary hesitancy or resistance.  Back pain: She reports chronic low back pain, intermittent, worse with bending.  She has tried Tylenol  with mild relief.  She has also tried Voltaren  gel.  Denies any numbness or tingling of the LE.  She had MRI of lumbar spine in 2023, which showed L4-5 spinal canal stenosis and neural foraminal narrowing at L4-5, L5-S1 levels. She had adequate relief with Tramadol .   Past Medical History:  Diagnosis Date   Anemia    Arthritis    Bradycardia    Cataract    Chronic kidney disease    Diabetes mellitus type II    Hypertension    Osteoporosis    Syncope    Weakness     Past Surgical  History:  Procedure Laterality Date   ABDOMINAL HYSTERECTOMY     CESAREAN SECTION     COLON SURGERY     EYE SURGERY     FRACTURE SURGERY      Family History  Problem Relation Age of Onset   Diabetes Sister    Hyperlipidemia Sister    Hypertension Sister    Diabetes Brother    Hyperlipidemia Brother    Hypertension Brother     Social History   Socioeconomic History   Marital status: Widowed    Spouse name: Not on file   Number of children: Not on file   Years of education: Not on file   Highest education level: Not on file  Occupational History   Not on file  Tobacco Use   Smoking status: Never   Smokeless tobacco: Never  Vaping Use   Vaping status: Never Used  Substance and Sexual Activity   Alcohol  use: No   Drug use: No   Sexual activity: Not Currently  Other Topics Concern   Not on file  Social History Narrative   ambudexterty   No caffeine   Two story home    Social Drivers of Health   Financial Resource Strain: Low Risk  (08/01/2023)   Overall Physicist, medical Strain (  CARDIA)    Difficulty of Paying Living Expenses: Not hard at all  Food Insecurity: Food Insecurity Present (08/01/2023)   Hunger Vital Sign    Worried About Running Out of Food in the Last Year: Sometimes true    Ran Out of Food in the Last Year: Sometimes true  Transportation Needs: Unmet Transportation Needs (08/01/2023)   PRAPARE - Administrator, Civil Service (Medical): Yes    Lack of Transportation (Non-Medical): Yes  Physical Activity: Inactive (08/01/2023)   Exercise Vital Sign    Days of Exercise per Week: 0 days    Minutes of Exercise per Session: 0 min  Stress: No Stress Concern Present (08/01/2023)   [employer] of Occupational Health - Occupational Stress Questionnaire    Feeling of Stress : Not at all  Social Connections: Moderately Integrated (08/01/2023)   Social Connection and Isolation Panel [NHANES]    Frequency of Communication with Friends and  Family: More than three times a week    Frequency of Social Gatherings with Friends and Family: More than three times a week    Attends Religious Services: More than 4 times per year    Active Member of [employer] or Organizations: Yes    Attends Banker Meetings: More than 4 times per year    Marital Status: Widowed  Intimate Partner Violence: Not At Risk (08/01/2023)   Humiliation, Afraid, Rape, and Kick questionnaire    Fear of Current or Ex-Partner: No    Emotionally Abused: No    Physically Abused: No    Sexually Abused: No    Outpatient Medications Prior to Visit  Medication Sig Dispense Refill   ACCU-CHEK AVIVA PLUS test strip TEST BLOOD SUGAR ONE TIME DAILY 100 strip 1   Accu-Chek Softclix Lancets lancets TEST BLOOD SUGAR ONE TIME DAILY 100 each 3   acetaminophen  (TYLENOL ) 500 MG tablet Take 1 tablet (500 mg total) by mouth every 6 (six) hours as needed. 30 tablet 0   Alcohol  Swabs (B-D SINGLE USE SWABS REGULAR) PADS USE TO CHECK BLOOD SUGAR DAILY 100 each 3   Blood Glucose Calibration (SURECHEK CONTROL SOLUTION) Normal LIQD Use to check blood sugar daily. Dx E11.65 1 each 3   Blood Glucose Monitoring Suppl (ACCU-CHEK AVIVA PLUS) w/Device KIT USE AS DIRECTED 1 kit 5   cyclobenzaprine  (FLEXERIL ) 5 MG tablet Take 1 tablet (5 mg total) by mouth 2 (two) times daily as needed for muscle spasms. 30 tablet 1   diclofenac  Sodium (VOLTAREN ) 1 % GEL Apply 2 g topically 4 (four) times daily as needed. 50 g 0   famotidine  (PEPCID ) 40 MG tablet Take 1 tablet (40 mg total) by mouth daily. 90 tablet 1   flecainide  (TAMBOCOR ) 100 MG tablet Take 0.5 tablets (50 mg total) by mouth 2 (two) times daily. 90 tablet 3   fluticasone  (FLONASE ) 50 MCG/ACT nasal spray Place 2 sprays into both nostrils daily. 16 g 0   glipiZIDE  (GLUCOTROL ) 10 MG tablet TAKE 1 TABLET TWICE DAILY BEFORE MEALS 180 tablet 3   hydrALAZINE  (APRESOLINE ) 50 MG tablet Take 1 tablet (50 mg total) by mouth 3 (three) times daily.  270 tablet 1   irbesartan  (AVAPRO ) 300 MG tablet TAKE 1 TABLET EVERY DAY 90 tablet 3   lidocaine  (LIDODERM ) 5 % Place 1 patch onto the skin daily. Remove & Discard patch within 12 hours or as directed by MD 30 patch 0   meclizine  (ANTIVERT ) 25 MG tablet TAKE 1 TABLET TWICE DAILY AS  NEEDED FOR DIZZINESS 60 tablet 3   nystatin  cream (MYCOSTATIN ) APPLY 1 APPLICATION TOPICALLY 2 (TWO) TIMES DAILY. 30 g 11   polyethylene glycol powder (GLYCOLAX /MIRALAX ) 17 GM/SCOOP powder Take 17 g by mouth 2 (two) times daily as needed. 255 g 3   polyethylene glycol powder (GLYCOLAX /MIRALAX ) powder Take 17 g by mouth 2 (two) times daily as needed. 500 g 1   rivaroxaban  (XARELTO ) 20 MG TABS tablet TAKE 1 TABLET(20 MG) BY MOUTH DAILY WITH SUPPER 90 tablet 1   simvastatin  (ZOCOR ) 20 MG tablet Take 1 tablet (20 mg total) by mouth daily at 6 PM. 90 tablet 3   traMADol  (ULTRAM ) 50 MG tablet Take 1 tablet (50 mg total) by mouth every 12 (twelve) hours as needed for severe pain (pain score 7-10). 20 tablet 0   valACYclovir  (VALTREX ) 1000 MG tablet Take 1 tablet (1,000 mg total) by mouth 2 (two) times daily. 14 tablet 0   No facility-administered medications prior to visit.    Allergies  Allergen Reactions   Codeine Other (See Comments) and Rash    vertigo vertigo     ROS Review of Systems  Constitutional:  Negative for chills and fever.  HENT:  Negative for congestion, sinus pressure, sinus pain and sore throat.   Eyes:  Negative for pain and discharge.  Respiratory:  Negative for cough and shortness of breath.   Cardiovascular:  Negative for chest pain and palpitations.  Gastrointestinal:  Negative for diarrhea, nausea and vomiting.  Endocrine: Negative for polydipsia and polyuria.  Genitourinary:  Negative for dysuria and hematuria.  Musculoskeletal:  Positive for back pain. Negative for neck pain and neck stiffness.  Skin:  Negative for rash.  Neurological:  Negative for dizziness and weakness.   Psychiatric/Behavioral:  Negative for agitation and behavioral problems.       Objective:    Physical Exam Vitals reviewed.  Constitutional:      General: She is not in acute distress.    Appearance: She is obese. She is not diaphoretic.  HENT:     Head: Normocephalic and atraumatic.     Nose: Nose normal.     Mouth/Throat:     Mouth: Mucous membranes are moist.  Eyes:     General: No scleral icterus.    Extraocular Movements: Extraocular movements intact.  Cardiovascular:     Rate and Rhythm: Normal rate. Rhythm irregular.     Heart sounds: Normal heart sounds. No murmur heard. Pulmonary:     Breath sounds: Normal breath sounds. No wheezing or rales.  Musculoskeletal:     Cervical back: Neck supple. No tenderness.     Lumbar back: Tenderness present. Decreased range of motion.     Right lower leg: No edema.     Left lower leg: No edema.  Skin:    General: Skin is warm.     Findings: No rash.  Neurological:     General: No focal deficit present.     Mental Status: She is alert and oriented to person, place, and time.     Sensory: No sensory deficit.     Motor: No weakness.  Psychiatric:        Mood and Affect: Mood normal.        Behavior: Behavior normal.     BP 125/76   Pulse 76   Ht 5\' 6"  (1.676 m)   Wt 192 lb 12.8 oz (87.5 kg)   SpO2 94%   BMI 31.12 kg/m  Wt Readings from Last 3  Encounters:  10/12/23 192 lb 12.8 oz (87.5 kg)  10/10/23 193 lb (87.5 kg)  09/01/23 192 lb (87.1 kg)    Lab Results  Component Value Date   TSH 1.030 07/12/2023   Lab Results  Component Value Date   WBC 3.6 07/12/2023   HGB 13.6 07/12/2023   HCT 42.8 07/12/2023   MCV 88 07/12/2023   PLT 170 07/12/2023   Lab Results  Component Value Date   NA 144 07/12/2023   K 4.2 07/12/2023   CO2 20 07/12/2023   GLUCOSE 74 07/12/2023   BUN 15 07/12/2023   CREATININE 1.12 (H) 07/12/2023   BILITOT 0.6 07/12/2023   ALKPHOS 124 (H) 07/12/2023   AST 20 07/12/2023   ALT 14  07/12/2023   PROT 8.4 07/12/2023   ALBUMIN 4.2 07/12/2023   CALCIUM 9.3 07/12/2023   ANIONGAP 9 07/18/2020   EGFR 48 (L) 07/12/2023   GFR 63.85 03/17/2015   Lab Results  Component Value Date   CHOL 188 07/12/2023   Lab Results  Component Value Date   HDL 61 07/12/2023   Lab Results  Component Value Date   LDLCALC 113 (H) 07/12/2023   Lab Results  Component Value Date   TRIG 77 07/12/2023   Lab Results  Component Value Date   CHOLHDL 3.1 07/12/2023   Lab Results  Component Value Date   HGBA1C 6.6 (H) 07/12/2023      Assessment & Plan:   Problem List Items Addressed This Visit       Cardiovascular and Mediastinum   Hypertension   BP Readings from Last 1 Encounters:  10/12/23 125/76   Well-controlled with irbesartan  300 mg QD and hydralazine  50 mg TID Counseled for compliance with the medications Advised DASH diet and moderate exercise/walking as tolerated      Atrial fibrillation (HCC)   On flecainide  and Xarelto , has run out of Xarelto  due to cost concern - advised to contact cardiology office Followed by cardiology        Endocrine   Type 2 diabetes mellitus with other specified complication (HCC) - Primary   Lab Results  Component Value Date   HGBA1C 6.6 (H) 07/12/2023   Well-controlled Associated with HTN and HLD On glipizide  10 mg twice daily Has stopped taking metformin  1000 mg once daily, and does not want to take it now despite counseling Advised to follow diabetic diet On statin and ARB F/u CMP and lipid panel Diabetic eye exam: Advised to follow up with Ophthalmology for diabetic eye exam      Relevant Orders   Basic Metabolic Panel (BMET)   Hemoglobin A1c     Musculoskeletal and Integument   Neural foraminal stenosis of lumbar spine   Her flank pain/back pain is likely due to DDD of lumbar spine in addition to muscular strain Tylenol  arthritis as needed for mild to moderate Avoid oral NSAIDs due to CKD and  anticoagulation Tramadol  as needed for severe pain - advised it can cause sedation, avoid taking with Flexeril  Flexeril  as needed for back muscle spasms Avoid heavy lifting and frequent bending Referred to Physiatry - discuss about epidural injection      Relevant Medications   traMADol  (ULTRAM ) 50 MG tablet   Other Relevant Orders   Ambulatory referral to Physical Medicine Rehab     Genitourinary   Stage 3a chronic kidney disease (HCC)   Last BMP reviewed, GFR stable at 48 Avoid nephrotoxic agents On ARB Checked urine microalbumin/creatinine ratio Check BMP today  Other   Mixed hyperlipidemia   On simvastatin  20 mg QD Check lipid profile - LDL above goal, but needs to improve diet         Meds ordered this encounter  Medications   traMADol  (ULTRAM ) 50 MG tablet    Sig: Take 1 tablet (50 mg total) by mouth every 12 (twelve) hours as needed for severe pain (pain score 7-10).    Dispense:  30 tablet    Refill:  0    Follow-up: Return in about 4 months (around 02/11/2024) for DM.    Meldon Sport, MD

## 2023-10-12 NOTE — Assessment & Plan Note (Addendum)
 Lab Results  Component Value Date   HGBA1C 6.6 (H) 07/12/2023   Well-controlled Associated with HTN and HLD On glipizide  10 mg twice daily Has stopped taking metformin  1000 mg once daily, and does not want to take it now despite counseling Advised to follow diabetic diet On statin and ARB F/u CMP and lipid panel Diabetic eye exam: Advised to follow up with Ophthalmology for diabetic eye exam

## 2023-10-12 NOTE — Assessment & Plan Note (Signed)
 On flecainide and Xarelto, has run out of Xarelto due to cost concern - advised to contact cardiology office Followed by cardiology

## 2023-10-12 NOTE — Assessment & Plan Note (Signed)
 On simvastatin  20 mg QD Check lipid profile - LDL above goal, but needs to improve diet

## 2023-10-12 NOTE — Patient Instructions (Addendum)
 Please take Tylenol  arthritis as needed for mild-moderate back pain. Please take Tramadol  for severe pain.  Please continue to take medications as prescribed.  Please continue to follow low carb diet and ambulate as tolerated.

## 2023-10-12 NOTE — Telephone Encounter (Signed)
 Received note that we need to provide assistance for xarelto . I tried to call the patient and it rang but no vm. Need to find out if she has the application or we need to send her one or if she wants to call the number on the back of her card to do the medicare payment plan

## 2023-10-12 NOTE — Assessment & Plan Note (Addendum)
 Last BMP reviewed, GFR stable at 48 Avoid nephrotoxic agents On ARB Checked urine microalbumin/creatinine ratio Check BMP today

## 2023-10-12 NOTE — Assessment & Plan Note (Signed)
 BP Readings from Last 1 Encounters:  10/12/23 125/76   Well-controlled with irbesartan  300 mg QD and hydralazine  50 mg TID Counseled for compliance with the medications Advised DASH diet and moderate exercise/walking as tolerated

## 2023-10-12 NOTE — Assessment & Plan Note (Addendum)
 Her flank pain/back pain is likely due to DDD of lumbar spine in addition to muscular strain Tylenol  arthritis as needed for mild to moderate Avoid oral NSAIDs due to CKD and anticoagulation Tramadol  as needed for severe pain - advised it can cause sedation, avoid taking with Flexeril  Flexeril  as needed for back muscle spasms Avoid heavy lifting and frequent bending Referred to Physiatry - discuss about epidural injection

## 2023-10-13 LAB — BASIC METABOLIC PANEL WITH GFR
BUN/Creatinine Ratio: 14 (ref 12–28)
BUN: 19 mg/dL (ref 8–27)
CO2: 24 mmol/L (ref 20–29)
Calcium: 9.2 mg/dL (ref 8.7–10.3)
Chloride: 99 mmol/L (ref 96–106)
Creatinine, Ser: 1.4 mg/dL — ABNORMAL HIGH (ref 0.57–1.00)
Glucose: 257 mg/dL — ABNORMAL HIGH (ref 70–99)
Potassium: 4.8 mmol/L (ref 3.5–5.2)
Sodium: 138 mmol/L (ref 134–144)
eGFR: 37 mL/min/{1.73_m2} — ABNORMAL LOW (ref >59)

## 2023-10-13 LAB — HEMOGLOBIN A1C
Est. average glucose Bld gHb Est-mCnc: 151 mg/dL
Hgb A1c MFr Bld: 6.9 % — ABNORMAL HIGH (ref 4.8–5.6)

## 2023-10-13 NOTE — Telephone Encounter (Signed)
I tried to call pt,no answer.

## 2023-10-14 ENCOUNTER — Observation Stay (HOSPITAL_COMMUNITY)
Admission: EM | Admit: 2023-10-14 | Discharge: 2023-10-15 | Disposition: A | Discharge status: Home / Self Care | Payer: Medicare (Managed Care) | Attending: Internal Medicine | Admitting: Internal Medicine

## 2023-10-14 ENCOUNTER — Encounter (HOSPITAL_COMMUNITY)
Admission: EM | Disposition: A | Discharge status: Home / Self Care | Payer: Self-pay | Source: Home / Self Care | Attending: Emergency Medicine

## 2023-10-14 ENCOUNTER — Observation Stay (HOSPITAL_COMMUNITY): Payer: Medicare (Managed Care) | Admitting: Certified Registered"

## 2023-10-14 ENCOUNTER — Other Ambulatory Visit: Payer: Self-pay

## 2023-10-14 ENCOUNTER — Encounter (HOSPITAL_COMMUNITY): Payer: Self-pay

## 2023-10-14 ENCOUNTER — Observation Stay (HOSPITAL_BASED_OUTPATIENT_CLINIC_OR_DEPARTMENT_OTHER): Payer: Medicare (Managed Care) | Admitting: Certified Registered"

## 2023-10-14 DIAGNOSIS — Z7984 Long term (current) use of oral hypoglycemic drugs: Secondary | ICD-10-CM | POA: Diagnosis not present

## 2023-10-14 DIAGNOSIS — K259 Gastric ulcer, unspecified as acute or chronic, without hemorrhage or perforation: Secondary | ICD-10-CM | POA: Diagnosis not present

## 2023-10-14 DIAGNOSIS — E669 Obesity, unspecified: Secondary | ICD-10-CM | POA: Diagnosis not present

## 2023-10-14 DIAGNOSIS — R0689 Other abnormalities of breathing: Secondary | ICD-10-CM | POA: Diagnosis not present

## 2023-10-14 DIAGNOSIS — I499 Cardiac arrhythmia, unspecified: Secondary | ICD-10-CM | POA: Diagnosis not present

## 2023-10-14 DIAGNOSIS — E785 Hyperlipidemia, unspecified: Secondary | ICD-10-CM | POA: Insufficient documentation

## 2023-10-14 DIAGNOSIS — K920 Hematemesis: Secondary | ICD-10-CM | POA: Diagnosis not present

## 2023-10-14 DIAGNOSIS — D62 Acute posthemorrhagic anemia: Principal | ICD-10-CM

## 2023-10-14 DIAGNOSIS — R58 Hemorrhage, not elsewhere classified: Secondary | ICD-10-CM | POA: Diagnosis not present

## 2023-10-14 DIAGNOSIS — R1111 Vomiting without nausea: Secondary | ICD-10-CM | POA: Diagnosis not present

## 2023-10-14 DIAGNOSIS — E66811 Obesity, class 1: Secondary | ICD-10-CM | POA: Diagnosis present

## 2023-10-14 DIAGNOSIS — I480 Paroxysmal atrial fibrillation: Secondary | ICD-10-CM | POA: Diagnosis not present

## 2023-10-14 DIAGNOSIS — K921 Melena: Secondary | ICD-10-CM | POA: Diagnosis not present

## 2023-10-14 DIAGNOSIS — K219 Gastro-esophageal reflux disease without esophagitis: Secondary | ICD-10-CM | POA: Diagnosis not present

## 2023-10-14 DIAGNOSIS — K922 Gastrointestinal hemorrhage, unspecified: Principal | ICD-10-CM

## 2023-10-14 DIAGNOSIS — D649 Anemia, unspecified: Principal | ICD-10-CM

## 2023-10-14 DIAGNOSIS — N1831 Chronic kidney disease, stage 3a: Secondary | ICD-10-CM

## 2023-10-14 DIAGNOSIS — Z7901 Long term (current) use of anticoagulants: Secondary | ICD-10-CM | POA: Diagnosis not present

## 2023-10-14 DIAGNOSIS — I129 Hypertensive chronic kidney disease with stage 1 through stage 4 chronic kidney disease, or unspecified chronic kidney disease: Secondary | ICD-10-CM | POA: Diagnosis not present

## 2023-10-14 DIAGNOSIS — E782 Mixed hyperlipidemia: Secondary | ICD-10-CM | POA: Diagnosis not present

## 2023-10-14 DIAGNOSIS — E1122 Type 2 diabetes mellitus with diabetic chronic kidney disease: Secondary | ICD-10-CM | POA: Diagnosis not present

## 2023-10-14 DIAGNOSIS — I10 Essential (primary) hypertension: Secondary | ICD-10-CM | POA: Diagnosis not present

## 2023-10-14 DIAGNOSIS — Z79899 Other long term (current) drug therapy: Secondary | ICD-10-CM | POA: Insufficient documentation

## 2023-10-14 DIAGNOSIS — R739 Hyperglycemia, unspecified: Secondary | ICD-10-CM | POA: Diagnosis not present

## 2023-10-14 DIAGNOSIS — E119 Type 2 diabetes mellitus without complications: Secondary | ICD-10-CM | POA: Diagnosis not present

## 2023-10-14 DIAGNOSIS — K279 Peptic ulcer, site unspecified, unspecified as acute or chronic, without hemorrhage or perforation: Secondary | ICD-10-CM | POA: Insufficient documentation

## 2023-10-14 DIAGNOSIS — M48061 Spinal stenosis, lumbar region without neurogenic claudication: Secondary | ICD-10-CM

## 2023-10-14 HISTORY — PX: ESOPHAGOGASTRODUODENOSCOPY: SHX5428

## 2023-10-14 LAB — COMPREHENSIVE METABOLIC PANEL WITH GFR
ALT: 13 U/L (ref 0–44)
AST: 17 U/L (ref 15–41)
Albumin: 3.1 g/dL — ABNORMAL LOW (ref 3.5–5.0)
Alkaline Phosphatase: 79 U/L (ref 38–126)
Anion gap: 8 (ref 5–15)
BUN: 34 mg/dL — ABNORMAL HIGH (ref 8–23)
CO2: 24 mmol/L (ref 22–32)
Calcium: 8.4 mg/dL — ABNORMAL LOW (ref 8.9–10.3)
Chloride: 105 mmol/L (ref 98–111)
Creatinine, Ser: 1.26 mg/dL — ABNORMAL HIGH (ref 0.44–1.00)
GFR, Estimated: 42 mL/min — ABNORMAL LOW (ref >60)
Glucose, Bld: 252 mg/dL — ABNORMAL HIGH (ref 70–99)
Potassium: 4 mmol/L (ref 3.5–5.1)
Sodium: 137 mmol/L (ref 135–145)
Total Bilirubin: 0.5 mg/dL (ref 0.0–1.2)
Total Protein: 7.5 g/dL (ref 6.5–8.1)

## 2023-10-14 LAB — CBC
HCT: 34 % — ABNORMAL LOW (ref 36.0–46.0)
HCT: 29.1 % — ABNORMAL LOW (ref 36.0–46.0)
Hemoglobin: 10.4 g/dL — ABNORMAL LOW (ref 12.0–15.0)
Hemoglobin: 9.1 g/dL — ABNORMAL LOW (ref 12.0–15.0)
MCH: 28.3 pg (ref 26.0–34.0)
MCH: 28.8 pg (ref 26.0–34.0)
MCHC: 30.6 g/dL (ref 30.0–36.0)
MCHC: 31.3 g/dL (ref 30.0–36.0)
MCV: 92.4 fL (ref 80.0–100.0)
MCV: 92.1 fL (ref 80.0–100.0)
Platelets: 167 10*3/uL (ref 150–400)
Platelets: 143 10*3/uL — ABNORMAL LOW (ref 150–400)
RBC: 3.68 MIL/uL — ABNORMAL LOW (ref 3.87–5.11)
RBC: 3.16 MIL/uL — ABNORMAL LOW (ref 3.87–5.11)
RDW: 15.1 % (ref 11.5–15.5)
RDW: 15 % (ref 11.5–15.5)
WBC: 8.9 10*3/uL (ref 4.0–10.5)
WBC: 5.8 10*3/uL (ref 4.0–10.5)
nRBC: 0 % (ref 0.0–0.2)
nRBC: 0 % (ref 0.0–0.2)

## 2023-10-14 LAB — VITAMIN B12: Vitamin B-12: 314 pg/mL (ref 180–914)

## 2023-10-14 LAB — GLUCOSE, CAPILLARY
Glucose-Capillary: 108 mg/dL — ABNORMAL HIGH (ref 70–99)
Glucose-Capillary: 146 mg/dL — ABNORMAL HIGH (ref 70–99)
Glucose-Capillary: 194 mg/dL — ABNORMAL HIGH (ref 70–99)
Glucose-Capillary: 88 mg/dL (ref 70–99)

## 2023-10-14 LAB — FOLATE: Folate: 6 ng/mL (ref >5.9)

## 2023-10-14 LAB — IRON AND TIBC
Iron: 93 ug/dL (ref 28–170)
Saturation Ratios: 30 % (ref 10.4–31.8)
TIBC: 312 ug/dL (ref 250–450)
UIBC: 219 ug/dL

## 2023-10-14 LAB — FERRITIN: Ferritin: 23 ng/mL (ref 11–307)

## 2023-10-14 LAB — RETICULOCYTES
Immature Retic Fract: 27 % — ABNORMAL HIGH (ref 2.3–15.9)
RBC.: 3.7 MIL/uL — ABNORMAL LOW (ref 3.87–5.11)
Retic Count, Absolute: 69.6 10*3/uL (ref 19.0–186.0)
Retic Ct Pct: 1.9 % (ref 0.4–3.1)

## 2023-10-14 LAB — POC OCCULT BLOOD, ED: Fecal Occult Bld: POSITIVE

## 2023-10-14 SURGERY — EGD (ESOPHAGOGASTRODUODENOSCOPY)
Anesthesia: General

## 2023-10-14 MED ORDER — INSULIN ASPART 100 UNIT/ML IJ SOLN
0.0000 [IU] | Freq: Three times a day (TID) | INTRAMUSCULAR | Status: DC
  Administered 2023-10-14: 2 [IU] via SUBCUTANEOUS

## 2023-10-14 MED ORDER — LIDOCAINE 2% (20 MG/ML) 5 ML SYRINGE
INTRAMUSCULAR | Status: DC | PRN
Start: 2023-10-14 — End: 2023-10-14
  Administered 2023-10-14: 100 mg via INTRAVENOUS

## 2023-10-14 MED ORDER — STERILE WATER FOR IRRIGATION IR SOLN
Status: DC | PRN
  Administered 2023-10-14: 240 mL

## 2023-10-14 MED ORDER — SIMVASTATIN 20 MG PO TABS
20.0000 mg | ORAL_TABLET | Freq: Every day | ORAL | Status: DC
  Administered 2023-10-14 – 2023-10-15 (×2): 20 mg via ORAL
  Filled 2023-10-14 (×2): qty 1

## 2023-10-14 MED ORDER — SODIUM CHLORIDE 0.9 % IV SOLN: INTRAVENOUS | Status: DC

## 2023-10-14 MED ORDER — HYDRALAZINE HCL 20 MG/ML IJ SOLN: 10.0000 mg | Freq: Three times a day (TID) | INTRAMUSCULAR | Status: DC | PRN

## 2023-10-14 MED ORDER — EPHEDRINE 5 MG/ML INJ
INTRAVENOUS | Status: AC
Start: 2023-10-14 — End: ?
  Filled 2023-10-14: qty 5

## 2023-10-14 MED ORDER — EPHEDRINE SULFATE-NACL 50-0.9 MG/10ML-% IV SOSY
PREFILLED_SYRINGE | INTRAVENOUS | Status: DC | PRN
  Administered 2023-10-14 (×3): 5 mg via INTRAVENOUS

## 2023-10-14 MED ORDER — PANTOPRAZOLE SODIUM 40 MG IV SOLR
40.0000 mg | Freq: Two times a day (BID) | INTRAVENOUS | Status: DC
  Administered 2023-10-14 – 2023-10-15 (×2): 40 mg via INTRAVENOUS
  Filled 2023-10-14 (×2): qty 10

## 2023-10-14 MED ORDER — FLUTICASONE PROPIONATE 50 MCG/ACT NA SUSP
2.0000 | Freq: Every day | NASAL | Status: DC
  Administered 2023-10-14 – 2023-10-15 (×2): 2 via NASAL
  Filled 2023-10-14 (×2): qty 16

## 2023-10-14 MED ORDER — PHENYLEPHRINE 80 MCG/ML (10ML) SYRINGE FOR IV PUSH (FOR BLOOD PRESSURE SUPPORT)
PREFILLED_SYRINGE | INTRAVENOUS | Status: DC | PRN
Start: 2023-10-14 — End: 2023-10-14
  Administered 2023-10-14 (×7): 160 ug via INTRAVENOUS
  Administered 2023-10-14: 80 ug via INTRAVENOUS
  Administered 2023-10-14 (×4): 160 ug via INTRAVENOUS
  Administered 2023-10-14: 80 ug via INTRAVENOUS
  Administered 2023-10-14 (×2): 160 ug via INTRAVENOUS

## 2023-10-14 MED ORDER — PROPOFOL 10 MG/ML IV BOLUS
INTRAVENOUS | Status: AC
  Filled 2023-10-14: qty 20

## 2023-10-14 MED ORDER — ACETAMINOPHEN 325 MG PO TABS
650.0000 mg | ORAL_TABLET | Freq: Four times a day (QID) | ORAL | Status: DC | PRN
  Administered 2023-10-14: 650 mg via ORAL
  Filled 2023-10-14: qty 2

## 2023-10-14 MED ORDER — CYCLOBENZAPRINE HCL 10 MG PO TABS: 5.0000 mg | ORAL_TABLET | Freq: Two times a day (BID) | ORAL | Status: DC | PRN

## 2023-10-14 MED ORDER — ONDANSETRON HCL 4 MG/2ML IJ SOLN: 4.0000 mg | Freq: Four times a day (QID) | INTRAMUSCULAR | Status: DC | PRN

## 2023-10-14 MED ORDER — PANTOPRAZOLE SODIUM 40 MG IV SOLR
40.0000 mg | INTRAVENOUS | Status: AC
Start: 2023-10-14 — End: 2023-10-14
  Administered 2023-10-14 (×2): 40 mg via INTRAVENOUS
  Filled 2023-10-14: qty 10

## 2023-10-14 MED ORDER — FLECAINIDE ACETATE 50 MG PO TABS
50.0000 mg | ORAL_TABLET | Freq: Two times a day (BID) | ORAL | Status: DC
  Administered 2023-10-14 – 2023-10-15 (×3): 50 mg via ORAL
  Filled 2023-10-14 (×3): qty 1

## 2023-10-14 MED ORDER — ACETAMINOPHEN 650 MG RE SUPP: 650.0000 mg | Freq: Four times a day (QID) | RECTAL | Status: DC | PRN

## 2023-10-14 MED ORDER — PHENYLEPHRINE 80 MCG/ML (10ML) SYRINGE FOR IV PUSH (FOR BLOOD PRESSURE SUPPORT)
PREFILLED_SYRINGE | INTRAVENOUS | Status: AC
  Filled 2023-10-14: qty 20

## 2023-10-14 MED ORDER — ONDANSETRON HCL 4 MG PO TABS: 4.0000 mg | ORAL_TABLET | Freq: Four times a day (QID) | ORAL | Status: DC | PRN

## 2023-10-14 MED ORDER — PROPOFOL 10 MG/ML IV BOLUS
INTRAVENOUS | Status: DC | PRN
  Administered 2023-10-14: 50 mg via INTRAVENOUS
  Administered 2023-10-14: 100 mg via INTRAVENOUS

## 2023-10-14 MED ORDER — LACTATED RINGERS IV SOLN: INTRAVENOUS | Status: DC | PRN

## 2023-10-14 MED ORDER — PROPOFOL 500 MG/50ML IV EMUL
INTRAVENOUS | Status: DC | PRN
Start: 2023-10-14 — End: 2023-10-14
  Administered 2023-10-14: 150 ug/kg/min via INTRAVENOUS

## 2023-10-14 MED ORDER — PHENYLEPHRINE 80 MCG/ML (10ML) SYRINGE FOR IV PUSH (FOR BLOOD PRESSURE SUPPORT)
PREFILLED_SYRINGE | INTRAVENOUS | Status: AC
  Filled 2023-10-14: qty 10

## 2023-10-14 MED ORDER — MECLIZINE HCL 12.5 MG PO TABS: 25.0000 mg | ORAL_TABLET | Freq: Two times a day (BID) | ORAL | Status: DC | PRN

## 2023-10-14 NOTE — Assessment & Plan Note (Signed)
-   Holding hydralazine  and Avapro  in the setting of acute GI bleed - As needed hydralazine  while closely monitoring vital signs has been ordered. -Planning to start heart healthy/low-sodium diet once cleared by GI.

## 2023-10-14 NOTE — ED Provider Notes (Signed)
 [place] EMERGENCY DEPARTMENT AT [facility] Provider Note   CSN: [id number] Arrival date & time: 10/14/23  4098     History  No chief complaint on file.   Catherine Joseph is a 83 y.o. female.  83 year old female with history of atrial fibrillation on Xarelto , diabetes, hypertension, and hyperlipidemia who presents to the emergency department with hematemesis and melena.  Patient reports that this morning she had an episode of hematemesis and 2 episodes of melena.  Has been feeling lightheaded.  Denies any abdominal pain.  Says that she has not taken Xarelto  in 3 months due to cost.  Not on any other blood thinners.  No heavy NSAID use.  No alcohol  use.  Last had a colonoscopy in [place] when she was 83 years old but does not recall what the results were.       Home Medications Prior to Admission medications   Medication Sig Start Date End Date Taking? Authorizing Provider  acetaminophen  (TYLENOL ) 500 MG tablet Take 1 tablet (500 mg total) by mouth every 6 (six) hours as needed. 11/19/16  Yes Cranston Dk, MD  flecainide  (TAMBOCOR ) 100 MG tablet Take 0.5 tablets (50 mg total) by mouth 2 (two) times daily. Patient taking differently: Take 100 mg by mouth at bedtime. 10/10/23  Yes Loyde Rule, MD  glipiZIDE  (GLUCOTROL ) 10 MG tablet TAKE 1 TABLET TWICE DAILY BEFORE MEALS Patient taking differently: Take 20 mg by mouth daily before breakfast. 03/10/23  Yes Meldon Sport, MD  hydrALAZINE  (APRESOLINE ) 50 MG tablet Take 1 tablet (50 mg total) by mouth 3 (three) times daily. 07/12/23  Yes Meldon Sport, MD  irbesartan  (AVAPRO ) 300 MG tablet TAKE 1 TABLET EVERY DAY 09/01/23  Yes Meldon Sport, MD  meclizine  (ANTIVERT ) 25 MG tablet TAKE 1 TABLET TWICE DAILY AS NEEDED FOR DIZZINESS Patient taking differently: Take 25 mg by mouth 2 (two) times daily as needed for dizziness. 04/25/23  Yes Meldon Sport, MD  nystatin  cream (MYCOSTATIN ) APPLY 1 APPLICATION TOPICALLY 2 (TWO) TIMES  DAILY. 05/31/23  Yes Meldon Sport, MD  simvastatin  (ZOCOR ) 20 MG tablet Take 1 tablet (20 mg total) by mouth daily at 6 PM. 04/06/23  Yes Meldon Sport, MD  traMADol  (ULTRAM ) 50 MG tablet Take 1 tablet (50 mg total) by mouth every 12 (twelve) hours as needed for severe pain (pain score 7-10). 10/12/23  Yes Meldon Sport, MD  ACCU-CHEK AVIVA PLUS test strip TEST BLOOD SUGAR ONE TIME DAILY 10/15/21   Elvira Hammersmith, MD  Accu-Chek Softclix Lancets lancets TEST BLOOD SUGAR ONE TIME DAILY 07/16/21   Elvira Hammersmith, MD  Alcohol  Swabs (B-D SINGLE USE SWABS REGULAR) PADS USE TO CHECK BLOOD SUGAR DAILY 09/03/20   Elvira Hammersmith, MD  Blood Glucose Calibration (SURECHEK CONTROL SOLUTION) Normal LIQD Use to check blood sugar daily. Dx E11.65 09/08/15   Cranston Dk, MD  Blood Glucose Monitoring Suppl (ACCU-CHEK AVIVA PLUS) w/Device KIT USE AS DIRECTED 08/14/21   Elvira Hammersmith, MD      Allergies    Codeine    Review of Systems   Review of Systems  Physical Exam Updated Vital Signs BP (!) 120/59 (BP Location: Left Arm)   Pulse (!) 57   Temp 99.2 F (37.3 C) (Oral)   Resp 16   Ht 5\' 7"  (1.702 m)   Wt 87.5 kg   SpO2 97%   BMI 30.20 kg/m  Physical Exam Vitals and nursing  note reviewed.  Constitutional:      General: She is not in acute distress.    Appearance: She is well-developed.  HENT:     Head: Normocephalic and atraumatic.     Right Ear: External ear normal.     Left Ear: External ear normal.     Nose: Nose normal.  Eyes:     Extraocular Movements: Extraocular movements intact.     Conjunctiva/sclera: Conjunctivae normal.     Pupils: Pupils are equal, round, and reactive to light.  Cardiovascular:     Rate and Rhythm: Normal rate. Rhythm irregular.     Heart sounds: No murmur heard. Pulmonary:     Effort: Pulmonary effort is normal. No respiratory distress.  Abdominal:     General: Abdomen is flat. There is no distension.     Palpations: Abdomen is  soft. There is no mass.     Tenderness: There is no abdominal tenderness. There is no guarding.  Musculoskeletal:     Cervical back: Normal range of motion and neck supple.     Right lower leg: No edema.     Left lower leg: No edema.  Skin:    General: Skin is warm and dry.  Neurological:     Mental Status: She is alert and oriented to person, place, and time. Mental status is at baseline.  Psychiatric:        Mood and Affect: Mood normal.     ED Results / Procedures / Treatments   Labs (all labs ordered are listed, but only abnormal results are displayed) Labs Reviewed  COMPREHENSIVE METABOLIC PANEL WITH GFR - Abnormal; Notable for the following components:      Result Value   Glucose, Bld 252 (*)    BUN 34 (*)    Creatinine, Ser 1.26 (*)    Calcium 8.4 (*)    Albumin 3.1 (*)    GFR, Estimated 42 (*)    All other components within normal limits  CBC - Abnormal; Notable for the following components:   RBC 3.68 (*)    Hemoglobin 10.4 (*)    HCT 34.0 (*)    All other components within normal limits  CBC - Abnormal; Notable for the following components:   RBC 3.16 (*)    Hemoglobin 9.1 (*)    HCT 29.1 (*)    Platelets 143 (*)    All other components within normal limits  RETICULOCYTES - Abnormal; Notable for the following components:   RBC. 3.70 (*)    Immature Retic Fract 27.0 (*)    All other components within normal limits  GLUCOSE, CAPILLARY - Abnormal; Notable for the following components:   Glucose-Capillary 146 (*)    All other components within normal limits  CBC - Abnormal; Notable for the following components:   RBC 2.64 (*)    Hemoglobin 7.5 (*)    HCT 23.7 (*)    Platelets 125 (*)    All other components within normal limits  GLUCOSE, CAPILLARY - Abnormal; Notable for the following components:   Glucose-Capillary 108 (*)    All other components within normal limits  GLUCOSE, CAPILLARY - Abnormal; Notable for the following components:    Glucose-Capillary 194 (*)    All other components within normal limits  GLUCOSE, CAPILLARY - Abnormal; Notable for the following components:   Glucose-Capillary 114 (*)    All other components within normal limits  POC OCCULT BLOOD, ED - Abnormal  VITAMIN B12  FOLATE  IRON AND TIBC  FERRITIN  GLUCOSE, CAPILLARY  HEMOGLOBIN AND HEMATOCRIT, BLOOD  TYPE AND SCREEN  SURGICAL PATHOLOGY    EKG EKG Interpretation Date/Time:  Friday October 14 2023 07:00:08 EDT Ventricular Rate:  67 PR Interval:  182 QRS Duration:  89 QT Interval:  417 QTC Calculation: 441 R Axis:   -7  Text Interpretation: Sinus rhythm Atrial premature complexes in couplets Consider anterior infarct Confirmed by Shyrl Doyne [id number]) on 10/14/2023 7:27:42 AM  Radiology No results found.  Procedures Procedures    Medications Ordered in ED Medications  pantoprazole  (PROTONIX ) injection 40 mg (40 mg Intravenous Given 10/14/23 0822)    Followed by  pantoprazole  (PROTONIX ) injection 40 mg (40 mg Intravenous Given 10/14/23 2007)  0.9 %  sodium chloride  infusion ( Intravenous Infusion Verify 10/15/23 0605)  acetaminophen  (TYLENOL ) tablet 650 mg (650 mg Oral Given 10/14/23 2006)    Or  acetaminophen  (TYLENOL ) suppository 650 mg ( Rectal See Alternative 10/14/23 2006)  ondansetron  (ZOFRAN ) tablet 4 mg ( Oral MAR Unhold 10/14/23 1445)    Or  ondansetron  (ZOFRAN ) injection 4 mg ( Intravenous MAR Unhold 10/14/23 1445)  cyclobenzaprine  (FLEXERIL ) tablet 5 mg ( Oral MAR Unhold 10/14/23 1445)  flecainide  (TAMBOCOR ) tablet 50 mg (50 mg Oral Given 10/14/23 2101)  fluticasone  (FLONASE ) 50 MCG/ACT nasal spray 2 spray ( Each Nare MAR Unhold 10/14/23 1445)  simvastatin  (ZOCOR ) tablet 20 mg (20 mg Oral Given 10/14/23 1756)  meclizine  (ANTIVERT ) tablet 25 mg ( Oral MAR Unhold 10/14/23 1445)  hydrALAZINE  (APRESOLINE ) injection 10 mg ( Intravenous MAR Unhold 10/14/23 1445)  insulin  aspart (novoLOG ) injection 0-9 Units (2 Units Subcutaneous  Given 10/14/23 1911)    ED Course/ Medical Decision Making/ A&P Clinical Course as of 10/15/23 0618  Fri Oct 14, 2023  0821 Dr Merrilee Abernethy from GI consulted.  [RP]  [phone] Dr Michaelene Admire from triad hospitalist to admit.  [RP]    Clinical Course User Index [RP] Ninetta Basket, MD                                 Medical Decision Making Amount and/or Complexity of Data Reviewed Labs: ordered.  Risk Prescription drug management. Decision regarding hospitalization.   Catherine Joseph is a 83 y.o. female with comorbidities that complicate the patient evaluation including atrial fibrillation on Xarelto , diabetes, hypertension, and hyperlipidemia who presents to the emergency department with hematemesis and melena.    Initial Ddx:  Upper GI bleed, peptic ulcer, lower GI bleed, diverticulosis, malignancy, AVM, hemorrhoids, variceal bleed, symptomatic anemia  MDM:  Concerned that the patient may have an upper GI bleed based on their symptoms.  No apparent bleeding hemorrhoids on exam.  Will go ahead and start the patient on Protonix  and consult GI.  Did not appear to be having symptomatic anemia at this time.  Low concern for variceal bleed given the patient's history.  Plan:  Labs Type and screen Hemoccult Protonix  GI consult  ED Summary/Re-evaluation:  Labs returned and showed that her hgb is 10 down from her baseline of 13. GI consulted and will see the patient. Hospitalist to admit. Patient  remained stable.   This patient presents to the ED for concern of complaints listed in HPI, this involves an extensive number of treatment options, and is a complaint that carries with it a high risk of complications and morbidity. Disposition including potential need for admission considered.   Dispo: Admit to Floor  Additional history obtained from  daughter Records reviewed Outpatient Clinic Notes The following labs were independently interpreted: CBC and show acute anemia I personally  reviewed and interpreted cardiac monitoring: normal sinus rhythm  with occasional pacs I personally reviewed and interpreted the pt's EKG: see above for interpretation  I have reviewed the patients home medications and made adjustments as needed Consults: Gastroenterology and Hospitalist Social Determinants of health:  Geriatric   Final Clinical Impression(s) / ED Diagnoses Final diagnoses:  Upper GI bleed  Anemia, unspecified type    Rx / DC Orders ED Discharge Orders     None         Ninetta Basket, MD 10/15/23 [phone]

## 2023-10-14 NOTE — ED Triage Notes (Addendum)
 [facility] from home. Cc of dark bloody stools x2 and 1x of dark chunky black emesis. Also c/o abdominal pain . Started this morning Ems placed 18g l ac gave 100ml before line infiltrated

## 2023-10-14 NOTE — Telephone Encounter (Signed)
I tried to call pt,no answer.

## 2023-10-14 NOTE — Consult Note (Signed)
 Gastroenterology Consult   Referring Provider: No ref. provider found Primary Care Physician:  Meldon Sport, MD Primary Gastroenterologist: Dr. Riley Cheadle (previously Dr Nickey Barn in 2017) . Patient ID: Catherine Joseph; [id number]; 1940-12-08   Admit date: 10/14/2023  LOS: 0 days   Date of Consultation: 10/14/2023  Reason for Consultation: Coffee-ground emesis, anemia, Melena   History of Present Illness   Catherine Joseph is a 83 y.o. year old female with history of with history of A-fib on Xarelto , CKD, type 2 diabetes, HTN, HLD who presented to the ED via EMS for hematemesis and melena at home.  Patient actually reports that she has not been taking Xarelto  for 3 months due to cost.  GI consulted for further evaluation given mild anemia, coffee-ground emesis, melena, and heme positive stool.  Reported to ED physician that she had a colonoscopy at 83 years old in [place] but does not recall the results.  In the ED: Labs -hemoglobin 10.4 (13.6 three months prior).  Creatinine 1.26, glucose 252, albumin 3.1, GFR 42. Stool Hemoccult positive No abdominal imaging performed Vital signs stable on presentation, slight decline in blood pressure within the last hour. Started on PPI IV BID  Consult:  Reports she woke up this morning around 4-5 AM.  She had an episode of coffee-ground emesis x 2 and states she has been having some black stools.  Also had some mild abdominal pain this morning but no additional episodes of emesis or melena since presentation to the hospital.  No syncope.  Denies NSAIDs, tobacco abuse, drug use, or EtOH.  Denies any NSAIDs or aspirin powder use.  Has been without her Xarelto  for 3 months for her A-fib given affordability.  Denies any ongoing constipation or diarrhea, no abdominal pain otherwise.  Does not typically have any acid reflux symptoms and is not currently on any H2 blocker or PPI.  Recently seen PCP 2 days ago.  Blood pressure within normal limits.  She  denied any urinary symptoms or polyphagia.  Compliant with simvastatin  for HLD.  Had reported some chronic low back pain, taking Tylenol  for mild relief and Voltaren  gel.  Has had adequate pain relief with tramadol  in the past.  She reported BP readings have been higher at home but possibly due to anxiety.  Reports she is compliant with her BP medications.  Had recently stopped taking her metformin  but continuing to take glipizide .   Past Medical History:  Diagnosis Date   Anemia    Arthritis    Bradycardia    Cataract    Chronic kidney disease    Diabetes mellitus type II    Hypertension    Osteoporosis    Syncope    Weakness     Past Surgical History:  Procedure Laterality Date   ABDOMINAL HYSTERECTOMY     CESAREAN SECTION     COLON SURGERY     EYE SURGERY     FRACTURE SURGERY      Prior to Admission medications   Medication Sig Start Date End Date Taking? Authorizing Provider  ACCU-CHEK AVIVA PLUS test strip TEST BLOOD SUGAR ONE TIME DAILY 10/15/21   Elvira Hammersmith, MD  Accu-Chek Softclix Lancets lancets TEST BLOOD SUGAR ONE TIME DAILY 07/16/21   Elvira Hammersmith, MD  acetaminophen  (TYLENOL ) 500 MG tablet Take 1 tablet (500 mg total) by mouth every 6 (six) hours as needed. 11/19/16   Cranston Dk, MD  Alcohol  Swabs (B-D SINGLE USE SWABS REGULAR) PADS USE TO  CHECK BLOOD SUGAR DAILY 09/03/20   Elvira Hammersmith, MD  Blood Glucose Calibration (SURECHEK CONTROL SOLUTION) Normal LIQD Use to check blood sugar daily. Dx E11.65 09/08/15   Cranston Dk, MD  Blood Glucose Monitoring Suppl (ACCU-CHEK AVIVA PLUS) w/Device KIT USE AS DIRECTED 08/14/21   Elvira Hammersmith, MD  cyclobenzaprine  (FLEXERIL ) 5 MG tablet Take 1 tablet (5 mg total) by mouth 2 (two) times daily as needed for muscle spasms. 09/29/22   Meldon Sport, MD  diclofenac  Sodium (VOLTAREN ) 1 % GEL Apply 2 g topically 4 (four) times daily as needed. 07/18/20   Harris, Abigail, PA-C  famotidine  (PEPCID ) 40 MG tablet  Take 1 tablet (40 mg total) by mouth daily. 04/06/23   Meldon Sport, MD  flecainide  (TAMBOCOR ) 100 MG tablet Take 0.5 tablets (50 mg total) by mouth 2 (two) times daily. 10/10/23   Nishan, Peter C, MD  fluticasone  (FLONASE ) 50 MCG/ACT nasal spray Place 2 sprays into both nostrils daily. 11/13/21   Leath-Warren, Belen Bowers, NP  glipiZIDE  (GLUCOTROL ) 10 MG tablet TAKE 1 TABLET TWICE DAILY BEFORE MEALS 03/10/23   Meldon Sport, MD  hydrALAZINE  (APRESOLINE ) 50 MG tablet Take 1 tablet (50 mg total) by mouth 3 (three) times daily. 07/12/23   Meldon Sport, MD  irbesartan  (AVAPRO ) 300 MG tablet TAKE 1 TABLET EVERY DAY 09/01/23   Meldon Sport, MD  lidocaine  (LIDODERM ) 5 % Place 1 patch onto the skin daily. Remove & Discard patch within 12 hours or as directed by MD 01/31/23   Meldon Sport, MD  meclizine  (ANTIVERT ) 25 MG tablet TAKE 1 TABLET TWICE DAILY AS NEEDED FOR DIZZINESS 04/25/23   Patel, Rutwik K, MD  nystatin  cream (MYCOSTATIN ) APPLY 1 APPLICATION TOPICALLY 2 (TWO) TIMES DAILY. 05/31/23   Patel, Rutwik K, MD  polyethylene glycol powder (GLYCOLAX /MIRALAX ) 17 GM/SCOOP powder Take 17 g by mouth 2 (two) times daily as needed. 05/24/19   Elyce Hams, Marguerita Shih, MD  polyethylene glycol powder (GLYCOLAX /MIRALAX ) powder Take 17 g by mouth 2 (two) times daily as needed. 11/19/16   Cranston Dk, MD  rivaroxaban  (XARELTO ) 20 MG TABS tablet TAKE 1 TABLET(20 MG) BY MOUTH DAILY WITH SUPPER 10/10/23   Nishan, Peter C, MD  simvastatin  (ZOCOR ) 20 MG tablet Take 1 tablet (20 mg total) by mouth daily at 6 PM. 04/06/23   Meldon Sport, MD  traMADol  (ULTRAM ) 50 MG tablet Take 1 tablet (50 mg total) by mouth every 12 (twelve) hours as needed for severe pain (pain score 7-10). 10/12/23   Meldon Sport, MD    Current Facility-Administered Medications  Medication Dose Route Frequency Provider Last Rate Last Admin   0.9 %  sodium chloride  infusion   Intravenous Continuous Justina Oman, MD       acetaminophen  (TYLENOL )  tablet 650 mg  650 mg Oral Q6H PRN Justina Oman, MD       Or   acetaminophen  (TYLENOL ) suppository 650 mg  650 mg Rectal Q6H PRN Justina Oman, MD       cyclobenzaprine  (FLEXERIL ) tablet 5 mg  5 mg Oral Q12H PRN Justina Oman, MD       flecainide  (TAMBOCOR ) tablet 50 mg  50 mg Oral BID Justina Oman, MD       fluticasone  (FLONASE ) 50 MCG/ACT nasal spray 2 spray  2 spray Each Nare Daily Justina Oman, MD       hydrALAZINE  (APRESOLINE ) injection 10 mg  10 mg Intravenous Q8H PRN Justina Oman,  MD       insulin  aspart (novoLOG ) injection 0-9 Units  0-9 Units Subcutaneous TID WC Justina Oman, MD       meclizine  (ANTIVERT ) tablet 25 mg  25 mg Oral BID PRN Justina Oman, MD       ondansetron  (ZOFRAN ) tablet 4 mg  4 mg Oral Q6H PRN Justina Oman, MD       Or   ondansetron  (ZOFRAN ) injection 4 mg  4 mg Intravenous Q6H PRN Justina Oman, MD       pantoprazole  (PROTONIX ) injection 40 mg  40 mg Intravenous Q12H Ninetta Basket, MD       simvastatin  (ZOCOR ) tablet 20 mg  20 mg Oral q1800 Justina Oman, MD       Current Outpatient Medications  Medication Sig Dispense Refill   ACCU-CHEK AVIVA PLUS test strip TEST BLOOD SUGAR ONE TIME DAILY 100 strip 1   Accu-Chek Softclix Lancets lancets TEST BLOOD SUGAR ONE TIME DAILY 100 each 3   acetaminophen  (TYLENOL ) 500 MG tablet Take 1 tablet (500 mg total) by mouth every 6 (six) hours as needed. 30 tablet 0   Alcohol  Swabs (B-D SINGLE USE SWABS REGULAR) PADS USE TO CHECK BLOOD SUGAR DAILY 100 each 3   Blood Glucose Calibration (SURECHEK CONTROL SOLUTION) Normal LIQD Use to check blood sugar daily. Dx E11.65 1 each 3   Blood Glucose Monitoring Suppl (ACCU-CHEK AVIVA PLUS) w/Device KIT USE AS DIRECTED 1 kit 5   cyclobenzaprine  (FLEXERIL ) 5 MG tablet Take 1 tablet (5 mg total) by mouth 2 (two) times daily as needed for muscle spasms. 30 tablet 1   diclofenac  Sodium (VOLTAREN ) 1 % GEL Apply 2 g topically 4 (four) times daily as needed. 50 g 0    famotidine  (PEPCID ) 40 MG tablet Take 1 tablet (40 mg total) by mouth daily. 90 tablet 1   flecainide  (TAMBOCOR ) 100 MG tablet Take 0.5 tablets (50 mg total) by mouth 2 (two) times daily. 90 tablet 3   fluticasone  (FLONASE ) 50 MCG/ACT nasal spray Place 2 sprays into both nostrils daily. 16 g 0   glipiZIDE  (GLUCOTROL ) 10 MG tablet TAKE 1 TABLET TWICE DAILY BEFORE MEALS 180 tablet 3   hydrALAZINE  (APRESOLINE ) 50 MG tablet Take 1 tablet (50 mg total) by mouth 3 (three) times daily. 270 tablet 1   irbesartan  (AVAPRO ) 300 MG tablet TAKE 1 TABLET EVERY DAY 90 tablet 3   lidocaine  (LIDODERM ) 5 % Place 1 patch onto the skin daily. Remove & Discard patch within 12 hours or as directed by MD 30 patch 0   meclizine  (ANTIVERT ) 25 MG tablet TAKE 1 TABLET TWICE DAILY AS NEEDED FOR DIZZINESS 60 tablet 3   nystatin  cream (MYCOSTATIN ) APPLY 1 APPLICATION TOPICALLY 2 (TWO) TIMES DAILY. 30 g 11   polyethylene glycol powder (GLYCOLAX /MIRALAX ) 17 GM/SCOOP powder Take 17 g by mouth 2 (two) times daily as needed. 255 g 3   polyethylene glycol powder (GLYCOLAX /MIRALAX ) powder Take 17 g by mouth 2 (two) times daily as needed. 500 g 1   rivaroxaban  (XARELTO ) 20 MG TABS tablet TAKE 1 TABLET(20 MG) BY MOUTH DAILY WITH SUPPER 90 tablet 1   simvastatin  (ZOCOR ) 20 MG tablet Take 1 tablet (20 mg total) by mouth daily at 6 PM. 90 tablet 3   traMADol  (ULTRAM ) 50 MG tablet Take 1 tablet (50 mg total) by mouth every 12 (twelve) hours as needed for severe pain (pain score 7-10). 30 tablet 0    Allergies as of 10/14/2023 - Review  Complete 10/14/2023  Allergen Reaction Noted   Codeine Other (See Comments) and Rash 07/22/2011    Family History  Problem Relation Age of Onset   Diabetes Sister    Hyperlipidemia Sister    Hypertension Sister    Diabetes Brother    Hyperlipidemia Brother    Hypertension Brother     Social History   Socioeconomic History   Marital status: Widowed    Spouse name: Not on file   Number of  children: Not on file   Years of education: Not on file   Highest education level: Not on file  Occupational History   Not on file  Tobacco Use   Smoking status: Never   Smokeless tobacco: Never  Vaping Use   Vaping status: Never Used  Substance and Sexual Activity   Alcohol  use: No   Drug use: No   Sexual activity: Not Currently  Other Topics Concern   Not on file  Social History Narrative   ambudexterty   No caffeine   Two story home    Social Drivers of Corporate investment banker Strain: Low Risk  (08/01/2023)   Overall Financial Resource Strain (CARDIA)    Difficulty of Paying Living Expenses: Not hard at all  Food Insecurity: Food Insecurity Present (08/01/2023)   Hunger Vital Sign    Worried About Running Out of Food in the Last Year: Sometimes true    Ran Out of Food in the Last Year: Sometimes true  Transportation Needs: Unmet Transportation Needs (08/01/2023)   PRAPARE - Transportation    Lack of Transportation (Medical): Yes    Lack of Transportation (Non-Medical): Yes  Physical Activity: Inactive (08/01/2023)   Exercise Vital Sign    Days of Exercise per Week: 0 days    Minutes of Exercise per Session: 0 min  Stress: No Stress Concern Present (08/01/2023)   [employer] of Occupational Health - Occupational Stress Questionnaire    Feeling of Stress : Not at all  Social Connections: Moderately Integrated (08/01/2023)   Social Connection and Isolation Panel [NHANES]    Frequency of Communication with Friends and Family: More than three times a week    Frequency of Social Gatherings with Friends and Family: More than three times a week    Attends Religious Services: More than 4 times per year    Active Member of [employer] or Organizations: Yes    Attends Banker Meetings: More than 4 times per year    Marital Status: Widowed  Intimate Partner Violence: Not At Risk (08/01/2023)   Humiliation, Afraid, Rape, and Kick questionnaire    Fear of Current or  Ex-Partner: No    Emotionally Abused: No    Physically Abused: No    Sexually Abused: No     Review of Systems   Gen: Denies any fever, chills, loss of appetite, change in weight or weight loss CV: + afib. Denies chest pain, heart palpitations, syncope, edema  Resp: Denies shortness of breath with rest, cough, wheezing, coughing up blood, and pleurisy. GI: see HPI  GU : Denies urinary burning, blood in urine, urinary frequency, and urinary incontinence. MS: Denies joint pain, limitation of movement, swelling, cramps, and atrophy.  Derm: Denies rash, itching, dry skin, hives. Psych: Denies depression, anxiety, memory loss, hallucinations, and confusion. Heme: Denies bruising or bleeding Neuro:  Denies any headaches, dizziness, paresthesias, shaking  Physical Exam   Vital Signs in last 24 hours: Temp:  [97.9 F (36.6 C)] 97.9 F (36.6 C) (  04/25 0658) Pulse Rate:  [68] 68 (04/25 0658) Resp:  [18] 18 (04/25 0658) BP: (139)/(87) 139/87 (04/25 0658) SpO2:  [93 %] 93 % (04/25 0658) Weight:  [87.5 kg] 87.5 kg (04/25 0655)    General:   Alert,  Well-developed, well-nourished, pleasant and cooperative in NAD Head:  Normocephalic and atraumatic. Eyes:  Sclera clear, no icterus.   Conjunctiva pink. Ears:  Normal auditory acuity. Mouth:  No deformity or lesions, dentition normal. Neck:  Supple; no masses Lungs:  Clear throughout to auscultation.   No wheezes, crackles, or rhonchi. No acute distress. Heart: Irregularly irregular Abdomen:  Soft, nontender and nondistended. No masses, hepatosplenomegaly or hernias noted. Normal bowel sounds, without guarding, and without rebound.   Rectal: deferred Msk:  Symmetrical without gross deformities. Normal posture. Extremities:  Without clubbing or edema. Neurologic:  Alert and  oriented x4. Skin:  Intact without significant lesions or rashes. Psych:  Alert and cooperative. Normal mood and affect.  Intake/Output from previous day: No  intake/output data recorded. Intake/Output this shift: No intake/output data recorded.   Labs/Studies   Recent Labs Recent Labs    10/14/23 0726  WBC 8.9  HGB 10.4*  HCT 34.0*  PLT 167   BMET Recent Labs    10/12/23 1408 10/14/23 0726  NA 138 137  K 4.8 4.0  CL 99 105  CO2 24 24  GLUCOSE 257* 252*  BUN 19 34*  CREATININE 1.40* 1.26*  CALCIUM 9.2 8.4*   LFT Recent Labs    10/14/23 0726  PROT 7.5  ALBUMIN 3.1*  AST 17  ALT 13  ALKPHOS 79  BILITOT 0.5   PT/INR No results for input(s): "LABPROT", "INR" in the last 72 hours. Hepatitis Panel No results for input(s): "HEPBSAG", "HCVAB", "HEPAIGM", "HEPBIGM" in the last 72 hours. C-Diff No results for input(s): "CDIFFTOX" in the last 72 hours.  Radiology/Studies No results found.   Assessment   Catherine Joseph is a 83 y.o. year old female with history of with history of A-fib on Xarelto , CKD stage III, type 2 diabetes, HTN, HLD who presented to the ED with complaints of coffee-ground emesis and melena as well as some associated abdominal pain.  GI consulted for further evaluation.  Coffee-ground emesis, melena, anemia:  BUN elevated, hemoglobin 10.4, was 13.63 months ago. Sudden onset of coffee-ground emesis and melena x 2 this morning with some mild abdominal pain Abdominal pain has improved and none currently on exam. Not currently on any Xarelto , has been out for 3 months Denies NSAIDs, alcohol  use, tobacco abuse, or other drug use Denies typical reflux symptoms, nausea, vomiting, or dysphagia Etiology unclear at this time but will perform EGD to evaluate for esophagitis, gastritis, duodenitis, peptic ulcer disease, AVMs, etc.  Plan / Recommendations   Npo Trend H/H, transfuse for hemoglobin < 7 Check iron studies EGD today with Dr. Riley Cheadle Further recommendations to follow.     10/14/2023, 9:17 AM  Julian Obey, MSN, FNP-BC, AGACNP-BC [facility]

## 2023-10-14 NOTE — Progress Notes (Signed)
   10/14/23 2253  TOC Brief Assessment  Insurance and Status Reviewed  Patient has primary care physician Yes  Home environment has been reviewed From home  Prior level of function: Independent  Prior/Current Home Services No current home services  Social Drivers of Health Review SDOH reviewed no interventions necessary  Readmission risk has been reviewed Yes  Transition of care needs no transition of care needs at this time   Transition of Care Department [facility]) has reviewed patient and no other TOC needs have been identified at this time. We will continue to monitor patient advancement through interdisciplinary progression rounds. If new patient needs arise, please place a TOC consult.

## 2023-10-14 NOTE — Transfer of Care (Signed)
 Immediate Anesthesia Transfer of Care Note  Patient: Catherine Joseph  Procedure(s) Performed: EGD (ESOPHAGOGASTRODUODENOSCOPY)  Patient Location: PACU  Anesthesia Type:General  Level of Consciousness: drowsy  Airway & Oxygen Therapy: Patient Spontanous Breathing  Post-op Assessment: Report given to RN and Post -op Vital signs reviewed and stable  Post vital signs: Reviewed and stable  Last Vitals:  Vitals Value Taken Time  BP 121/101   Temp 36.6 C 10/14/23 1331  Pulse 61 10/14/23 1331  Resp 19 10/14/23 1331  SpO2 100 % 10/14/23 1331  Vitals shown include unfiled device data.  Last Pain:  Vitals:   10/14/23 1201  TempSrc:   PainSc: 0-No pain         Complications: No notable events documented.

## 2023-10-14 NOTE — H&P (Signed)
 History and Physical    Patient: Catherine Joseph ZOX:[id number] DOB: [birth year] DOA: 10/14/2023 DOS: the patient was seen and examined on 10/14/2023 PCP: Meldon Sport, MD   Patient coming from: Home  Chief Complaint: No chief complaint on file.  HPI: Catherine Joseph is a 83 y.o. female with medical history significant of dismal atrial fibrillation, type 2 diabetes mellitus, hypertension, chronic kidney disease stage IIIa, hyperlipidemia, GERD and class I obesity; who presented to the emergency department after experiencing 2 episodes of dark bloody stools at home and 1 episode of dark coffee-ground emesis.  Patient reports symptoms occurred suddenly, no use of NSAIDs, no alcohol , no changes in her diet, no sick contacts and expressed no associated abdominal pain, chest pain or shortness of breath.  Patient has also reported no focal weaknesses, dysuria, hematuria, headaches/blurred vision or palpitations.  Workup in the ED demonstrating positive fecal occult blood test and a CBC showing 3 g drop in her hemoglobin in the last 3 months.  Patient expressed couple month of Xarelto  due to lack of affordability.  Case discussed with gastroenterology service who recommended initiating PPI and they will see her in consultation for most likely endoscopic evaluation.  [facility] contacted to place patient in the hospital for further evaluation and management.  Review of Systems: As mentioned in the history of present illness. All other systems reviewed and are negative. Past Medical History:  Diagnosis Date   Anemia    Arthritis    Bradycardia    Cataract    Chronic kidney disease    Diabetes mellitus type II    Hypertension    Osteoporosis    Syncope    Weakness    Past Surgical History:  Procedure Laterality Date   ABDOMINAL HYSTERECTOMY     CESAREAN SECTION     COLON SURGERY     EYE SURGERY     FRACTURE SURGERY     Social History:  reports that she has never smoked. She has never  used smokeless tobacco. She reports that she does not drink alcohol  and does not use drugs.  Allergies  Allergen Reactions   Codeine Other (See Comments) and Rash    vertigo vertigo     Family History  Problem Relation Age of Onset   Diabetes Sister    Hyperlipidemia Sister    Hypertension Sister    Diabetes Brother    Hyperlipidemia Brother    Hypertension Brother     Prior to Admission medications   Medication Sig Start Date End Date Taking? Authorizing Provider  ACCU-CHEK AVIVA PLUS test strip TEST BLOOD SUGAR ONE TIME DAILY 10/15/21   Elvira Hammersmith, MD  Accu-Chek Softclix Lancets lancets TEST BLOOD SUGAR ONE TIME DAILY 07/16/21   Elvira Hammersmith, MD  acetaminophen  (TYLENOL ) 500 MG tablet Take 1 tablet (500 mg total) by mouth every 6 (six) hours as needed. 11/19/16   Cranston Dk, MD  Alcohol  Swabs (B-D SINGLE USE SWABS REGULAR) PADS USE TO CHECK BLOOD SUGAR DAILY 09/03/20   Elvira Hammersmith, MD  Blood Glucose Calibration (SURECHEK CONTROL SOLUTION) Normal LIQD Use to check blood sugar daily. Dx E11.65 09/08/15   Cranston Dk, MD  Blood Glucose Monitoring Suppl (ACCU-CHEK AVIVA PLUS) w/Device KIT USE AS DIRECTED 08/14/21   Elvira Hammersmith, MD  cyclobenzaprine  (FLEXERIL ) 5 MG tablet Take 1 tablet (5 mg total) by mouth 2 (two) times daily as needed for muscle spasms. 09/29/22   Meldon Sport, MD  diclofenac   Sodium (VOLTAREN ) 1 % GEL Apply 2 g topically 4 (four) times daily as needed. 07/18/20   Harris, Abigail, PA-C  famotidine  (PEPCID ) 40 MG tablet Take 1 tablet (40 mg total) by mouth daily. 04/06/23   Meldon Sport, MD  flecainide  (TAMBOCOR ) 100 MG tablet Take 0.5 tablets (50 mg total) by mouth 2 (two) times daily. 10/10/23   Nishan, Peter C, MD  fluticasone  (FLONASE ) 50 MCG/ACT nasal spray Place 2 sprays into both nostrils daily. 11/13/21   Leath-Warren, Belen Bowers, NP  glipiZIDE  (GLUCOTROL ) 10 MG tablet TAKE 1 TABLET TWICE DAILY BEFORE MEALS 03/10/23   Meldon Sport, MD  hydrALAZINE  (APRESOLINE ) 50 MG tablet Take 1 tablet (50 mg total) by mouth 3 (three) times daily. 07/12/23   Meldon Sport, MD  irbesartan  (AVAPRO ) 300 MG tablet TAKE 1 TABLET EVERY DAY 09/01/23   Meldon Sport, MD  lidocaine  (LIDODERM ) 5 % Place 1 patch onto the skin daily. Remove & Discard patch within 12 hours or as directed by MD 01/31/23   Meldon Sport, MD  meclizine  (ANTIVERT ) 25 MG tablet TAKE 1 TABLET TWICE DAILY AS NEEDED FOR DIZZINESS 04/25/23   Patel, Rutwik K, MD  nystatin  cream (MYCOSTATIN ) APPLY 1 APPLICATION TOPICALLY 2 (TWO) TIMES DAILY. 05/31/23   Patel, Rutwik K, MD  polyethylene glycol powder (GLYCOLAX /MIRALAX ) 17 GM/SCOOP powder Take 17 g by mouth 2 (two) times daily as needed. 05/24/19   Elyce Hams, Marguerita Shih, MD  polyethylene glycol powder (GLYCOLAX /MIRALAX ) powder Take 17 g by mouth 2 (two) times daily as needed. 11/19/16   Cranston Dk, MD  rivaroxaban  (XARELTO ) 20 MG TABS tablet TAKE 1 TABLET(20 MG) BY MOUTH DAILY WITH SUPPER 10/10/23   Nishan, Peter C, MD  simvastatin  (ZOCOR ) 20 MG tablet Take 1 tablet (20 mg total) by mouth daily at 6 PM. 04/06/23   Meldon Sport, MD  traMADol  (ULTRAM ) 50 MG tablet Take 1 tablet (50 mg total) by mouth every 12 (twelve) hours as needed for severe pain (pain score 7-10). 10/12/23   Meldon Sport, MD    Physical Exam: Vitals:   10/14/23 0655 10/14/23 0658  BP:  139/87  Pulse:  68  Resp:  18  Temp:  97.9 F (36.6 C)  SpO2:  93%  Weight: 87.5 kg   Height: 5\' 7"  (1.702 m)    General exam: Alert, awake, oriented x 3; in no acute distress. Respiratory system: Clear to auscultation. Respiratory effort normal.  Good saturation on room air. Cardiovascular system:RRR. No murmurs, rubs, gallops. Gastrointestinal system: Abdomen is nondistended, soft and nontender. No organomegaly or masses felt. Normal bowel sounds heard. Central nervous system: Alert and oriented. No focal neurological deficits. Extremities: No cyanosis or  clubbing. Skin: No petechiae. Psychiatry: Judgement and insight appear normal. Mood & affect appropriate.   Data Reviewed: CBC: White cells 8.9, hemoglobin 10.4 and platelet count 167K Comprehensive metabolic panel: Sodium 137, potassium 4.4, chloride 105, bicarb 24, BUN 34, creatinine 1.26, normal LFTs and GFR 42  Assessment and Plan: Acute blood loss anemia (ABLA) - Patient with coffee-ground emesis and melanotic stools - Positive fecal occult blood test - Hemoglobin down roughly 3 g in the last 3 months. -Hemodynamically stable - 2 large IV requested; patient has been typed and screened - Follow hemoglobin trend - IV PPI has been started - GI service consulted for endoscopic evaluation - No needing transfusion currently.   PAF (paroxysmal atrial fibrillation) (HCC) - Rate control and currently sinus -  Continue Tambocor  - Patient on Xarelto  for secondary prevention; has been open month of medications due to lack of affordability.  Cardiology service assisting with manufacturer assistance programs; planning endoscopic evaluation and clearance by GI prior to resuming blood thinner.  GERD (gastroesophageal reflux disease) - Continue PPI.  Class 1 obesity -Body mass index is 30.2 kg/m. - Low-calorie diet and portion control discussed with patient.  HLD (hyperlipidemia) - Continue statin.  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  HTN (hypertension) - Holding hydralazine  and Avapro  in the setting of acute GI bleed - As needed hydralazine  while closely monitoring vital signs has been ordered. -Planning to start heart healthy/low-sodium diet once cleared by GI.  Chronic kidney disease stage IIIa - Appears to be stable at baseline - Maintain adequate  hydration - Minimize nephrotoxic agents - Follow renal function trend.     Advance Care Planning:   Code Status: Full Code   Consults: Gastroenterology service.  Family Communication: No family at bedside.  Severity of Illness: The appropriate patient status for this patient is OBSERVATION. Observation status is judged to be reasonable and necessary in order to provide the required intensity of service to ensure the patient's safety. The patient's presenting symptoms, physical exam findings, and initial radiographic and laboratory data in the context of their medical condition is felt to place them at decreased risk for further clinical deterioration. Furthermore, it is anticipated that the patient will be medically stable for discharge from the hospital within 2 midnights of admission.   Author: Justina Oman, MD 10/14/2023 9:46 AM  For on call review [email or web address].

## 2023-10-14 NOTE — Assessment & Plan Note (Signed)
 Continue statin.

## 2023-10-14 NOTE — Care Management Obs Status (Signed)
 MEDICARE OBSERVATION STATUS NOTIFICATION   Patient Details  Name: MICKY OVERTURF MRN: [id number] Date of Birth: [birth year]   Medicare Observation Status Notification Given:  Yes    Geraldina Klinefelter, RN 10/14/2023, 9:14 PM

## 2023-10-14 NOTE — Assessment & Plan Note (Signed)
-   Rate control and currently sinus - Continue Tambocor  - Patient on Xarelto  for secondary prevention; has been open month of medications due to lack of affordability.  Cardiology service assisting with manufacturer assistance programs; planning endoscopic evaluation and clearance by GI prior to resuming blood thinner.

## 2023-10-14 NOTE — Anesthesia Postprocedure Evaluation (Signed)
 Anesthesia Post Note  Patient: Catherine Joseph  Procedure(s) Performed: EGD (ESOPHAGOGASTRODUODENOSCOPY)  Patient location during evaluation: Phase II Anesthesia Type: General Level of consciousness: awake Pain management: pain level controlled Vital Signs Assessment: post-procedure vital signs reviewed and stable Respiratory status: spontaneous breathing and respiratory function stable Cardiovascular status: blood pressure returned to baseline and stable Postop Assessment: no headache and no apparent nausea or vomiting Anesthetic complications: no Comments: Late entry   No notable events documented.   Last Vitals:  Vitals:   10/14/23 1404 10/14/23 1407  BP: (!) 87/66 121/68  Pulse: (!) 126 (!) 59  Resp: 18 16  Temp: 36.7 C   SpO2: 99% 98%    Last Pain:  Vitals:   10/14/23 1407  TempSrc:   PainSc: 0-No pain                 Coretha Dew

## 2023-10-14 NOTE — Assessment & Plan Note (Signed)
 Continue PPI ?

## 2023-10-14 NOTE — Anesthesia Preprocedure Evaluation (Signed)
 Anesthesia Evaluation  Patient identified by MRN, date of birth, ID band Patient awake    Reviewed: Allergy & Precautions, H&P , NPO status , Patient's Chart, lab work & pertinent test results, reviewed documented beta blocker date and time   Airway Mallampati: II  TM Distance: >3 FB Neck ROM: full    Dental no notable dental hx.    Pulmonary neg pulmonary ROS   Pulmonary exam normal breath sounds clear to auscultation       Cardiovascular Exercise Tolerance: Good hypertension,  Rhythm:regular Rate:Normal     Neuro/Psych negative neurological ROS  negative psych ROS   GI/Hepatic Neg liver ROS,GERD  ,,  Endo/Other  diabetes    Renal/GU Renal disease  negative genitourinary   Musculoskeletal   Abdominal   Peds  Hematology  (+) Blood dyscrasia, anemia   Anesthesia Other Findings   Reproductive/Obstetrics negative OB ROS                             Anesthesia Physical Anesthesia Plan  ASA: 3 and emergent  Anesthesia Plan: General   Post-op Pain Management:    Induction:   PONV Risk Score and Plan: Propofol  infusion  Airway Management Planned:   Additional Equipment:   Intra-op Plan:   Post-operative Plan:   Informed Consent: I have reviewed the patients History and Physical, chart, labs and discussed the procedure including the risks, benefits and alternatives for the proposed anesthesia with the patient or authorized representative who has indicated his/her understanding and acceptance.     Dental Advisory Given  Plan Discussed with: CRNA  Anesthesia Plan Comments:        Anesthesia Quick Evaluation

## 2023-10-14 NOTE — Op Note (Addendum)
 [facility] Patient Name: Catherine Joseph Procedure Date: 10/14/2023 11:19 AM MRN: [id number] Date of Birth: 09/03/1940 Attending MD: Gemma Kelp , MD, [id number] CSN: [id number] Age: 83 Admit Type: Outpatient Procedure:                Upper GI endoscopy Indications:              Hematemesis Providers:                Gemma Kelp, MD, Tammy Vaught, RN, Theola Fitch, [place] Wilson, Technician Referring MD:              Medicines:                Propofol  per Anesthesia Complications:            No immediate complications. Estimated Blood Loss:     Estimated blood loss was minimal. Procedure:                Pre-Anesthesia Assessment:                           - Prior to the procedure, a History and Physical                            was performed, and patient medications and                            allergies were reviewed. The patient's tolerance of                            previous anesthesia was also reviewed. The risks                            and benefits of the procedure and the sedation                            options and risks were discussed with the patient.                            All questions were answered, and informed consent                            was obtained. Prior Anticoagulants: The patient has                            taken no anticoagulant or antiplatelet agents. ASA                            Grade Assessment: II - A patient with mild systemic                            disease. After reviewing the risks and benefits,  the patient was deemed in satisfactory condition to                            undergo the procedure.                           After obtaining informed consent, the endoscope was                            passed under direct vision. Throughout the                            procedure, the patient's blood pressure, pulse, and                             oxygen saturations were monitored continuously. The                            GIF-H190 ([id number]) scope was introduced through the                            mouth, and advanced to the second part of duodenum. Scope In: 12:06:53 PM Scope Out: 1:26:24 PM Total Procedure Duration: 1 hour 19 minutes 31 seconds  Findings:      The examined esophagus was normal. Stomach contained a large amount of       bloody fluid and clot. An extended period of time required to suction       and lavage the stomach out. BioVac attached to the gastroscope which       facilitated large-volume lavage.      1800 cc of lavage/old blood/clot recovered. Eventually gained good       visualization of the gastric mucosa. On the greater curvature, there was       an 8 mm elliptical ulcer with friable margins with an adjacent       triangular-shaped 4 mm ulceration which appeared to have a clean base.       There were also associated linear erosions along the greater curvature.       The larger ulcer was closed with 1 mantis and 2 resolution clips. Please       note there was no active bleeding found anywhere throughout the       examination. Finally, biopsies of the antrum and body were taken for H.       pylori assessment.      The duodenal bulb and second portion of the duodenum were normal. Impression:               - Normal esophagus. Large amount of clot and old                            blood in the stomach. Greater curvature ulcers as                            described above clips placed. Associated linear                            erosions. Gastric  biopsies taken.                           - Normal duodenal bulb and second portion of the                            duodenum. I cannot rule out a Mallory-Weiss                            component here although she appeared to have 2                            small pre-existing gastric ulcers.                           - Daughter states patient has been  having back pain                            recently and has multiple various anti-inflammatory                            agents at home. She is believes she has been taking                            quite a bit of Motrin recently. This would fit with                            her clinical scenario. Moderate Sedation:      Moderate (conscious) sedation was personally administered by an       anesthesia professional. The following parameters were monitored: oxygen       saturation, heart rate, blood pressure, respiratory rate, EKG, adequacy       of pulmonary ventilation, and response to care. Recommendation:           - Return patient to hospital ward for ongoing care.                           - Avoid all NSAIDs. Daughter is going to Help                            Follow Those Recommendations. Protonix  40 Mg Orally                            Twice Daily x 12 Weeks.                           - Clear liquid diet today advance as tolerated                            tomorrow. If she remains stable, she can likely go                            home tomorrow                           -  At patient request, I called Remonia Carmin (daughter) at                            [phone] - reviewed findings and                            recommendations. Questions answered. Procedure Code(s):        --- Professional ---                           [phone], Esophagogastroduodenoscopy, flexible,                            transoral; diagnostic, including collection of                            specimen(s) by brushing or washing, when performed                            (separate procedure) Diagnosis Code(s):        --- Professional ---                           K92.0, Hematemesis CPT copyright 2022 American Medical Association. All rights reserved. The codes documented in this report are preliminary and upon coder review may  be revised to meet current compliance requirements. Windsor Hatcher. Hawley Michel, MD Gemma Kelp, MD 10/14/2023 1:55:56 PM This report has been signed electronically. Number of Addenda: 0

## 2023-10-14 NOTE — Assessment & Plan Note (Signed)
-   Recent A1c 6.6 - Modified carbohydrate diet discussed with patient - While inpatient we will hold oral hypoglycemic agents - Sliding scale insulin  has been started.

## 2023-10-14 NOTE — Assessment & Plan Note (Signed)
-  Body mass index is 30.2 kg/m?. ?-Low calorie diet and portion control discussed with patient. ?

## 2023-10-14 NOTE — Anesthesia Procedure Notes (Signed)
 Date/Time: 10/14/2023 12:01 PM  Performed by: Sherwin Donate, CRNAPre-anesthesia Checklist: Patient identified, Emergency Drugs available, Suction available and Patient being monitored Patient Re-evaluated:Patient Re-evaluated prior to induction Oxygen Delivery Method: Nasal cannula Induction Type: IV induction Placement Confirmation: positive ETCO2 Comments: Optilfow High Flow [place] O2 used.

## 2023-10-14 NOTE — Assessment & Plan Note (Signed)
-   Patient with coffee-ground emesis and melanotic stools - Positive fecal occult blood test - Hemoglobin down roughly 3 g in the last 3 months. -Hemodynamically stable - 2 large IV requested; patient has been typed and screened - Follow hemoglobin trend - IV PPI has been started - GI service consulted for endoscopic evaluation - No needing transfusion currently.

## 2023-10-15 DIAGNOSIS — K920 Hematemesis: Secondary | ICD-10-CM | POA: Diagnosis not present

## 2023-10-15 DIAGNOSIS — K921 Melena: Secondary | ICD-10-CM | POA: Diagnosis not present

## 2023-10-15 DIAGNOSIS — E782 Mixed hyperlipidemia: Secondary | ICD-10-CM | POA: Diagnosis not present

## 2023-10-15 DIAGNOSIS — D649 Anemia, unspecified: Secondary | ICD-10-CM | POA: Diagnosis not present

## 2023-10-15 DIAGNOSIS — K259 Gastric ulcer, unspecified as acute or chronic, without hemorrhage or perforation: Secondary | ICD-10-CM | POA: Diagnosis not present

## 2023-10-15 DIAGNOSIS — K219 Gastro-esophageal reflux disease without esophagitis: Secondary | ICD-10-CM | POA: Diagnosis not present

## 2023-10-15 DIAGNOSIS — D62 Acute posthemorrhagic anemia: Secondary | ICD-10-CM | POA: Diagnosis not present

## 2023-10-15 DIAGNOSIS — E119 Type 2 diabetes mellitus without complications: Secondary | ICD-10-CM | POA: Diagnosis not present

## 2023-10-15 DIAGNOSIS — E66811 Obesity, class 1: Secondary | ICD-10-CM | POA: Diagnosis not present

## 2023-10-15 DIAGNOSIS — K922 Gastrointestinal hemorrhage, unspecified: Principal | ICD-10-CM

## 2023-10-15 DIAGNOSIS — I10 Essential (primary) hypertension: Secondary | ICD-10-CM | POA: Diagnosis not present

## 2023-10-15 DIAGNOSIS — I480 Paroxysmal atrial fibrillation: Secondary | ICD-10-CM | POA: Diagnosis not present

## 2023-10-15 LAB — CBC
HCT: 23.7 % — ABNORMAL LOW (ref 36.0–46.0)
Hemoglobin: 7.5 g/dL — ABNORMAL LOW (ref 12.0–15.0)
MCH: 28.4 pg (ref 26.0–34.0)
MCHC: 31.6 g/dL (ref 30.0–36.0)
MCV: 89.8 fL (ref 80.0–100.0)
Platelets: 125 10*3/uL — ABNORMAL LOW (ref 150–400)
RBC: 2.64 MIL/uL — ABNORMAL LOW (ref 3.87–5.11)
RDW: 15 % (ref 11.5–15.5)
WBC: 4.2 10*3/uL (ref 4.0–10.5)
nRBC: 0 % (ref 0.0–0.2)

## 2023-10-15 LAB — GLUCOSE, CAPILLARY
Glucose-Capillary: 114 mg/dL — ABNORMAL HIGH (ref 70–99)
Glucose-Capillary: 129 mg/dL — ABNORMAL HIGH (ref 70–99)
Glucose-Capillary: 165 mg/dL — ABNORMAL HIGH (ref 70–99)
Glucose-Capillary: 153 mg/dL — ABNORMAL HIGH (ref 70–99)

## 2023-10-15 LAB — ABO/RH: ABO/RH(D): B POS

## 2023-10-15 LAB — PREPARE RBC (CROSSMATCH)

## 2023-10-15 MED ORDER — PANTOPRAZOLE SODIUM 40 MG PO TBEC
40.0000 mg | DELAYED_RELEASE_TABLET | Freq: Two times a day (BID) | ORAL | 3 refills | Status: DC
Start: 2023-10-15 — End: 2024-02-08

## 2023-10-15 MED ORDER — INSULIN ASPART 100 UNIT/ML IJ SOLN: 0.0000 [IU] | Freq: Every day | INTRAMUSCULAR | Status: DC

## 2023-10-15 MED ORDER — FUROSEMIDE 10 MG/ML IJ SOLN
20.0000 mg | Freq: Once | INTRAMUSCULAR | Status: AC
  Administered 2023-10-15: 20 mg via INTRAVENOUS
  Filled 2023-10-15: qty 2

## 2023-10-15 MED ORDER — SODIUM CHLORIDE 0.9% IV SOLUTION: Freq: Once | INTRAVENOUS | Status: AC

## 2023-10-15 MED ORDER — TRAMADOL HCL 50 MG PO TABS: 50.0000 mg | ORAL_TABLET | Freq: Two times a day (BID) | ORAL | 0 refills | Status: AC | PRN

## 2023-10-15 MED ORDER — INSULIN ASPART 100 UNIT/ML IJ SOLN
0.0000 [IU] | Freq: Three times a day (TID) | INTRAMUSCULAR | Status: DC
  Administered 2023-10-15: 2 [IU] via SUBCUTANEOUS

## 2023-10-15 NOTE — Progress Notes (Signed)
Patient discharged home today, transported home by family. Discharge paperwork went over with patient and daughter, both verbalized understanding. Belongings sent home with patient.  

## 2023-10-15 NOTE — Progress Notes (Signed)
 Patient completed blood transfusion, vital signs: T-98.5, BP-126/65, R-17, P-87, O2-99% at room air. Patient verbalized no complaints. MD Michaelene Admire made aware.

## 2023-10-15 NOTE — Discharge Summary (Signed)
 Physician Discharge Summary   Patient: Catherine Joseph MRN: [id number] DOB: [birth year]  Admit date:     10/14/2023  Discharge date: 10/15/23  Discharge Physician: Justina Oman   PCP: Meldon Sport, MD   Recommendations at discharge:  Repeat CBC to follow hemoglobin trend/stability Continue assisting patient with weight loss management Repeat basic metabolic panel to follow electrolytes and renal function Make sure patient follow-up with gastroenterology service as instructed.  Discharge Diagnoses: Principal Problem:   Acute blood loss anemia (ABLA) Active Problems:   HTN (hypertension)   PAF (paroxysmal atrial fibrillation) (HCC)   Controlled type 2 diabetes mellitus without complication, without long-term current use of insulin  (HCC)   HLD (hyperlipidemia)   Class 1 obesity   GERD (gastroesophageal reflux disease)   Upper GI bleed  Brief Hospital admission narrative: Catherine Joseph is a 83 y.o. female with medical history significant of dismal atrial fibrillation, type 2 diabetes mellitus, hypertension, chronic kidney disease stage IIIa, hyperlipidemia, GERD and class I obesity; who presented to the emergency department after experiencing 2 episodes of dark bloody stools at home and 1 episode of dark coffee-ground emesis.   Patient reports symptoms occurred suddenly, no use of NSAIDs, no alcohol , no changes in her diet, no sick contacts and expressed no associated abdominal pain, chest pain or shortness of breath.   Patient has also reported no focal weaknesses, dysuria, hematuria, headaches/blurred vision or palpitations.   Workup in the ED demonstrating positive fecal occult blood test and a CBC showing 3 g drop in her hemoglobin in the last 3 months.  Patient expressed couple month of Xarelto  due to lack of affordability.  Case discussed with gastroenterology service who recommended initiating PPI and they will see her in consultation for most likely endoscopic  evaluation.  [facility] contacted to place patient in the hospital for further evaluation and management.    Assessment and Plan: acute blood loss anemia/upper GI bleed - Status post endoscopy demonstrating gastric ulcer status post clipping by GI service; there was also some irritation and Mallory-Weiss changes unable to be ruled out.  Per GI most likely cause was the use of NSAIDs - Patient advised to stop NSAIDs - Pain medication prescribed to assist and instruction to use Tylenol  provided - Repeat CBC at follow-up visit to assess stability - PPI twice a day prescribed - Patient advised to maintain adequate hydration.  PAF (paroxysmal atrial fibrillation) (HCC) - Rate control and currently sinus - Continue Tambocor  - Patient will be safe to resume Xarelto  once medication acquire (or after 1 week). - Continue patient follow-up with cardiology service.   GERD (gastroesophageal reflux disease) - Continue PPI. -Dose adjusted to twice a day as mentioned in problem #1.   Class 1 obesity -Body mass index is 30.2 kg/m. - Low-calorie diet and portion control discussed with patient.   HLD (hyperlipidemia) - Continue statin.   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   HTN (hypertension) - Resume home antihypertensive agents - Heart healthy diet discussed with patient.   Chronic kidney disease stage IIIa - Appears to be stable at baseline - Maintain adequate hydration - Continue to minimize nephrotoxic agents - Follow renal function trend.  Consultants: GI service Procedures performed: See below for x-ray reports. Disposition: Home Diet recommendation: Heart healthy/modified carbohydrate diet.  DISCHARGE MEDICATION: Allergies as of 10/15/2023  Reactions   Codeine Rash, Other (See Comments)   Vertigo          Medication List     TAKE these medications    Accu-Chek Aviva Plus test strip Generic drug: glucose blood TEST BLOOD SUGAR ONE TIME DAILY   Accu-Chek Aviva Plus w/Device Kit USE AS DIRECTED   Accu-Chek Softclix Lancets lancets TEST BLOOD SUGAR ONE TIME DAILY   acetaminophen  500 MG tablet Commonly known as: TYLENOL  Take 1 tablet (500 mg total) by mouth every 6 (six) hours as needed.   B-D SINGLE USE SWABS REGULAR Pads USE TO CHECK BLOOD SUGAR DAILY   flecainide  100 MG tablet Commonly known as: TAMBOCOR  Take 0.5 tablets (50 mg total) by mouth 2 (two) times daily. What changed:  how much to take when to take this   glipiZIDE  10 MG tablet Commonly known as: GLUCOTROL  TAKE 1 TABLET TWICE DAILY BEFORE MEALS What changed: See the new instructions.   hydrALAZINE  50 MG tablet Commonly known as: APRESOLINE  Take 1 tablet (50 mg total) by mouth 3 (three) times daily.   irbesartan  300 MG tablet Commonly known as: AVAPRO  TAKE 1 TABLET EVERY DAY   meclizine  25 MG tablet Commonly known as: ANTIVERT  TAKE 1 TABLET TWICE DAILY AS NEEDED FOR DIZZINESS What changed: See the new instructions.   nystatin  cream Commonly known as: MYCOSTATIN  APPLY 1 APPLICATION TOPICALLY 2 (TWO) TIMES DAILY.   pantoprazole  40 MG tablet Commonly known as: Protonix  Take 1 tablet (40 mg total) by mouth 2 (two) times daily.   simvastatin  20 MG tablet Commonly known as: ZOCOR  Take 1 tablet (20 mg total) by mouth daily at 6 PM.   SureChek Control Solution Normal Liqd Use to check blood sugar daily. Dx E11.65   traMADol  50 MG tablet Commonly known as: ULTRAM  Take 1 tablet (50 mg total) by mouth every 12 (twelve) hours as needed for severe pain (pain score 7-10).        Follow-up Information     Meldon Sport, MD. Schedule an appointment as soon as possible for a visit in 10 day(s).   Specialty: Internal Medicine Contact information: [address] [place]  [address] [phone]                Discharge Exam: Cleavon Curls Weights   10/14/23 0655 10/14/23 1147  Weight: 87.5 kg 87.5 kg   General exam: Alert, awake, oriented x 3 Respiratory system: Clear to auscultation. Respiratory effort normal. Cardiovascular system:RRR. No murmurs, rubs, gallops. Gastrointestinal system: Abdomen is obese, nondistended, soft and nontender. No organomegaly or masses felt. Normal bowel sounds heard. Central nervous system: Alert and oriented. No focal neurological deficits. Extremities: No C/C/E, +pedal pulses Skin: No rashes, lesions or ulcers Psychiatry: Judgement and insight appear normal. Mood & affect appropriate.    Condition at discharge: Stable and improved.  The results of significant diagnostics from this hospitalization (including imaging, microbiology, ancillary and laboratory) are listed below for reference.   Imaging Studies: No results found.  Microbiology: Results for orders placed or performed in visit on 09/01/23  Anaerobic and Aerobic Culture     Status: Abnormal   Collection Time: 09/01/23 12:00 AM  Result Value Ref Range Status   Anaerobic Culture Final report  Final   Result 1 Comment  Final    Comment: No anaerobic growth in 72 hours.   Aerobic Culture Final report (A)  Final   Result 1 Klebsiella pneumoniae (A)  Final    Comment: Heavy growth  Result 2 Routine flora  Final    Comment: Light growth   Antimicrobial Susceptibility Comment  Final    Comment:       ** S = Susceptible; I = Intermediate; R = Resistant **                    P = Positive; N = Negative             MICS are expressed in micrograms per mL    Antibiotic                 RSLT#1    RSLT#2    RSLT#3    RSLT#4 Amoxicillin /Clavulanic Acid    I Ampicillin                      R Cefazolin                      S Cefepime                       S Cefoxitin                      S Cefpodoxime                    S Ceftriaxone                     S Ciprofloxacin                   S Ertapenem                      S Gentamicin                     S Levofloxacin                   S Meropenem                      S Piperacillin /Tazobactam        S Tetracycline                   S Tobramycin                     S Trimethoprim/Sulfa             S     Labs: CBC: Recent Labs  Lab 10/14/23 0726 10/14/23 1509 10/15/23 0021  WBC 8.9 5.8 4.2  HGB 10.4* 9.1* 7.5*  HCT 34.0* 29.1* 23.7*  MCV 92.4 92.1 89.8  PLT 167 143* 125*   Basic Metabolic Panel: Recent Labs  Lab 10/12/23 1408 10/14/23 0726  NA 138 137  K 4.8 4.0  CL 99 105  CO2 24 24  GLUCOSE 257* 252*  BUN 19 34*  CREATININE 1.40* 1.26*  CALCIUM 9.2 8.4*   Liver Function Tests: Recent Labs  Lab 10/14/23 0726  AST 17  ALT 13  ALKPHOS 79  BILITOT 0.5  PROT 7.5  ALBUMIN 3.1*   CBG: Recent Labs  Lab 10/14/23 1755 10/14/23 2332 10/15/23 0605 10/15/23 0738 10/15/23 1113  GLUCAP 194* 88 114* 129* 165*    Discharge time spent: greater than 30 minutes.  Signed: Justina Oman, MD [facility] 10/15/2023

## 2023-10-15 NOTE — Progress Notes (Signed)
 Patient without complaints today.  No more hematemesis no melena/no BM overnight.  Remains stable.  Diet has been advanced.   Vital signs in last 24 hours: Temp:  [97.8 F (36.6 C)-99.2 F (37.3 C)] 99.2 F (37.3 C) (04/26 0603) Pulse Rate:  [57-126] 57 (04/26 0603) Resp:  [15-24] 16 (04/26 0603) BP: (87-165)/(41-101) 120/59 (04/26 0603) SpO2:  [93 %-100 %] 97 % (04/26 0603) Weight:  [87.5 kg] 87.5 kg (04/25 1147) Last BM Date : 10/14/23 General:   Alert,  Well-developed, well-nourished, pleasant and cooperative in NAD Abdomen: Nondistended.  Soft and nontender  Intake/Output from previous day: 04/25 0701 - 04/26 0700 In: 2400.6 [P.O.:720; I.V.:1680.6] Out: -  Intake/Output this shift: No intake/output data recorded.  Lab Results: Recent Labs    10/14/23 0726 10/14/23 1509 10/15/23 0021  WBC 8.9 5.8 4.2  HGB 10.4* 9.1* 7.5*  HCT 34.0* 29.1* 23.7*  PLT 167 143* 125*   BMET Recent Labs    10/12/23 1408 10/14/23 0726  NA 138 137  K 4.8 4.0  CL 99 105  CO2 24 24  GLUCOSE 257* 252*  BUN 19 34*  CREATININE 1.40* 1.26*  CALCIUM 9.2 8.4*   LFT Recent Labs    10/14/23 0726  PROT 7.5  ALBUMIN 3.1*  AST 17  ALT 13  ALKPHOS 79  BILITOT 0.5    Impression: Pleasant 83 year old lady admitted to the hospital yesterday with painless hematemesis and melena.  EGD demonstrated large amount of clotted blood in the stomach.  Eventually I found 2 small ulcers on the greater curvature - largest of the two sealed with clips.    There were associated linear greater curvature erosions.  Biopsies taken for H. pylori.  Clinically, no further bleeding.  I suspect heaving may have exacerbated bleeding.  The patient denied taking any nonsteroidals, [name], endorses multiple OTC pain relievers at home and believes her mother was taking ibuprofen recently.  Recommendations:  Continue Protonix  40 mg twice daily  Check  medicine cabinet at home.  Patient needs to avoid all  NSAIDs moving forward.  Discussed at length with patient and daughter at the bedside today.  Follow-up on pathology  Office visit with us  in 12 weeks.  Would recommend a repeat EGD to verify ulcer healing  From a GI standpoint, could go home later today as discussed with Dr. Michaelene Admire.

## 2023-10-15 NOTE — Plan of Care (Signed)
   Problem: Activity: Goal: Risk for activity intolerance will decrease Outcome: Progressing   Problem: Coping: Goal: Level of anxiety will decrease Outcome: Progressing

## 2023-10-16 LAB — BPAM RBC
Blood Product Expiration Date: 202505122359
ISSUE DATE / TIME: 202504261155
Unit Type and Rh: 5100

## 2023-10-16 LAB — TYPE AND SCREEN
ABO/RH(D): B POS
Antibody Screen: NEGATIVE
Unit division: 0

## 2023-10-17 ENCOUNTER — Telehealth: Payer: Self-pay | Admitting: Internal Medicine

## 2023-10-17 ENCOUNTER — Encounter (HOSPITAL_COMMUNITY): Payer: Self-pay | Admitting: Internal Medicine

## 2023-10-17 NOTE — Telephone Encounter (Signed)
 Patient put on wait list- if anything else I can do please advise

## 2023-10-17 NOTE — Telephone Encounter (Signed)
 Copied from CRM [id number]. Topic: Appointments - Scheduling Inquiry for Clinic >> Oct 17, 2023  1:54 PM Everlene Hobby D wrote: Patient was released from the hospital last Friday and called in to setup her f/u with her pcp I got her scheduled for May 12 at 11:00 she needs a sooner appointment and only wants to f/u with her pcp.

## 2023-10-17 NOTE — Telephone Encounter (Signed)
 I tried to call pt, no answer    Mailed application anyways

## 2023-10-18 LAB — SURGICAL PATHOLOGY

## 2023-10-19 ENCOUNTER — Encounter: Payer: Self-pay | Admitting: Internal Medicine

## 2023-10-31 ENCOUNTER — Encounter: Payer: Self-pay | Admitting: Internal Medicine

## 2023-10-31 ENCOUNTER — Ambulatory Visit (INDEPENDENT_AMBULATORY_CARE_PROVIDER_SITE_OTHER): Payer: Medicare (Managed Care) | Admitting: Internal Medicine

## 2023-10-31 VITALS — BP 126/53 | HR 63 | Ht 66.0 in | Wt 193.0 lb

## 2023-10-31 DIAGNOSIS — I480 Paroxysmal atrial fibrillation: Secondary | ICD-10-CM

## 2023-10-31 DIAGNOSIS — N1831 Chronic kidney disease, stage 3a: Secondary | ICD-10-CM

## 2023-10-31 DIAGNOSIS — K922 Gastrointestinal hemorrhage, unspecified: Secondary | ICD-10-CM

## 2023-10-31 DIAGNOSIS — K279 Peptic ulcer, site unspecified, unspecified as acute or chronic, without hemorrhage or perforation: Secondary | ICD-10-CM

## 2023-10-31 DIAGNOSIS — D62 Acute posthemorrhagic anemia: Secondary | ICD-10-CM | POA: Diagnosis not present

## 2023-10-31 DIAGNOSIS — Z09 Encounter for follow-up examination after completed treatment for conditions other than malignant neoplasm: Secondary | ICD-10-CM | POA: Insufficient documentation

## 2023-10-31 NOTE — Assessment & Plan Note (Signed)
 On flecainide and Xarelto, has run out of Xarelto due to cost concern - advised to contact cardiology office Followed by cardiology

## 2023-10-31 NOTE — Patient Instructions (Addendum)
 Please start taking ferrous sulfate 325 mg once daily and Vitamin B12 500 mcg once daily.  Please continue to take medications as prescribed.  Please continue to follow low carb diet and ambulate as tolerated.

## 2023-10-31 NOTE — Assessment & Plan Note (Signed)
 Was found during EGD, s/p clip placement On Pantoprazole  40 mg BID Avoid hot and spicy food

## 2023-10-31 NOTE — Progress Notes (Signed)
 Established Patient Office Visit  Subjective:  Patient ID: Catherine Joseph, female    DOB: 09-May-1941  Age: 83 y.o. MRN: [id number]  CC:  Chief Complaint  Patient presents with   Hospitalization Follow-up    Hospital Follow up     HPI Catherine Joseph is a 83 y.o. female with past medical history of atrial fibrillation, HTN, type II DM, HLD and obesity who presents for follow-up after recent hospitalization.  She presented to the emergency department on 10/14/23 after experiencing 2 episodes of dark bloody stools at home and 1 episode of dark coffee-ground emesis. She had endoscopy demonstrating gastric ulcer status post clipping by GI service; there was also some irritation and Mallory-Weiss changes unable to be ruled out. Per GI most likely cause was the use of NSAIDs, but she denies taking any NSAID. Her Hb was 7.5 on the day of discharge, but was given 1 U PRBC after it.  She denies any episode of melena or hematochezia currently. She still has epigastric pain. Takes Pantoprazole  40 mg BID now.  Past Medical History:  Diagnosis Date   Anemia    Arthritis    Bradycardia    Cataract    Chronic kidney disease    Diabetes mellitus type II    Hypertension    Osteoporosis    Syncope    Weakness     Past Surgical History:  Procedure Laterality Date   ABDOMINAL HYSTERECTOMY     CESAREAN SECTION     COLON SURGERY     ESOPHAGOGASTRODUODENOSCOPY N/A 10/14/2023   Procedure: EGD (ESOPHAGOGASTRODUODENOSCOPY);  Surgeon: Suzette Espy, MD;  Location: [facility];  Service: Endoscopy;  Laterality: N/A;   EYE SURGERY     FRACTURE SURGERY      Family History  Problem Relation Age of Onset   Diabetes Sister    Hyperlipidemia Sister    Hypertension Sister    Diabetes Brother    Hyperlipidemia Brother    Hypertension Brother     Social History   Socioeconomic History   Marital status: Widowed    Spouse name: Not on file   Number of children: Not on file   Years  of education: Not on file   Highest education level: Not on file  Occupational History   Not on file  Tobacco Use   Smoking status: Never   Smokeless tobacco: Never  Vaping Use   Vaping status: Never Used  Substance and Sexual Activity   Alcohol  use: No   Drug use: No   Sexual activity: Not Currently  Other Topics Concern   Not on file  Social History Narrative   ambudexterty   No caffeine   Two story home    Social Drivers of Corporate investment banker Strain: Low Risk  (08/01/2023)   Overall Financial Resource Strain (CARDIA)    Difficulty of Paying Living Expenses: Not hard at all  Food Insecurity: No Food Insecurity (10/14/2023)   Hunger Vital Sign    Worried About Running Out of Food in the Last Year: Never true    Ran Out of Food in the Last Year: Never true  Recent Concern: Food Insecurity - Food Insecurity Present (08/01/2023)   Hunger Vital Sign    Worried About Running Out of Food in the Last Year: Sometimes true    Ran Out of Food in the Last Year: Sometimes true  Transportation Needs: No Transportation Needs (10/14/2023)   PRAPARE - Transportation    Lack  of Transportation (Medical): No    Lack of Transportation (Non-Medical): No  Recent Concern: Transportation Needs - Unmet Transportation Needs (08/01/2023)   PRAPARE - Transportation    Lack of Transportation (Medical): Yes    Lack of Transportation (Non-Medical): Yes  Physical Activity: Inactive (08/01/2023)   Exercise Vital Sign    Days of Exercise per Week: 0 days    Minutes of Exercise per Session: 0 min  Stress: No Stress Concern Present (08/01/2023)   [employer] of Occupational Health - Occupational Stress Questionnaire    Feeling of Stress : Not at all  Social Connections: Moderately Integrated (10/14/2023)   Social Connection and Isolation Panel [NHANES]    Frequency of Communication with Friends and Family: Three times a week    Frequency of Social Gatherings with Friends and Family: More than  three times a week    Attends Religious Services: More than 4 times per year    Active Member of Clubs or Organizations: Yes    Attends Banker Meetings: More than 4 times per year    Marital Status: Widowed  Intimate Partner Violence: Not At Risk (10/14/2023)   Humiliation, Afraid, Rape, and Kick questionnaire    Fear of Current or Ex-Partner: No    Emotionally Abused: No    Physically Abused: No    Sexually Abused: No    Outpatient Medications Prior to Visit  Medication Sig Dispense Refill   rivaroxaban  (XARELTO ) 20 MG TABS tablet Take 20 mg by mouth daily with supper.     ACCU-CHEK AVIVA PLUS test strip TEST BLOOD SUGAR ONE TIME DAILY 100 strip 1   Accu-Chek Softclix Lancets lancets TEST BLOOD SUGAR ONE TIME DAILY 100 each 3   acetaminophen  (TYLENOL ) 500 MG tablet Take 1 tablet (500 mg total) by mouth every 6 (six) hours as needed. 30 tablet 0   Alcohol  Swabs (B-D SINGLE USE SWABS REGULAR) PADS USE TO CHECK BLOOD SUGAR DAILY 100 each 3   Blood Glucose Calibration (SURECHEK CONTROL SOLUTION) Normal LIQD Use to check blood sugar daily. Dx E11.65 1 each 3   Blood Glucose Monitoring Suppl (ACCU-CHEK AVIVA PLUS) w/Device KIT USE AS DIRECTED 1 kit 5   flecainide  (TAMBOCOR ) 100 MG tablet Take 0.5 tablets (50 mg total) by mouth 2 (two) times daily. (Patient taking differently: Take 100 mg by mouth at bedtime.) 90 tablet 3   glipiZIDE  (GLUCOTROL ) 10 MG tablet TAKE 1 TABLET TWICE DAILY BEFORE MEALS (Patient taking differently: Take 20 mg by mouth daily before breakfast.) 180 tablet 3   hydrALAZINE  (APRESOLINE ) 50 MG tablet Take 1 tablet (50 mg total) by mouth 3 (three) times daily. 270 tablet 1   irbesartan  (AVAPRO ) 300 MG tablet TAKE 1 TABLET EVERY DAY 90 tablet 3   meclizine  (ANTIVERT ) 25 MG tablet TAKE 1 TABLET TWICE DAILY AS NEEDED FOR DIZZINESS (Patient taking differently: Take 25 mg by mouth 2 (two) times daily as needed for dizziness.) 60 tablet 3   nystatin  cream (MYCOSTATIN )  APPLY 1 APPLICATION TOPICALLY 2 (TWO) TIMES DAILY. 30 g 11   pantoprazole  (PROTONIX ) 40 MG tablet Take 1 tablet (40 mg total) by mouth 2 (two) times daily. 60 tablet 3   simvastatin  (ZOCOR ) 20 MG tablet Take 1 tablet (20 mg total) by mouth daily at 6 PM. 90 tablet 3   traMADol  (ULTRAM ) 50 MG tablet Take 1 tablet (50 mg total) by mouth every 12 (twelve) hours as needed for severe pain (pain score 7-10). 30 tablet 0  No facility-administered medications prior to visit.    Allergies  Allergen Reactions   Codeine Rash and Other (See Comments)    Vertigo     ROS Review of Systems  Constitutional:  Negative for chills and fever.  HENT:  Negative for congestion, sinus pressure, sinus pain and sore throat.   Eyes:  Negative for pain and discharge.  Respiratory:  Negative for cough and shortness of breath.   Cardiovascular:  Negative for chest pain and palpitations.  Gastrointestinal:  Positive for abdominal pain (Epigastric). Negative for diarrhea, nausea and vomiting.  Endocrine: Negative for polydipsia and polyuria.  Genitourinary:  Negative for dysuria and hematuria.  Musculoskeletal:  Positive for back pain. Negative for neck pain and neck stiffness.  Skin:  Negative for rash.  Neurological:  Negative for dizziness and weakness.  Psychiatric/Behavioral:  Negative for agitation and behavioral problems.       Objective:     Physical Exam Vitals reviewed.  Constitutional:      General: She is not in acute distress.    Appearance: She is obese. She is not diaphoretic.  HENT:     Head: Normocephalic and atraumatic.     Nose: Nose normal.     Mouth/Throat:     Mouth: Mucous membranes are moist.  Eyes:     General: No scleral icterus.    Extraocular Movements: Extraocular movements intact.  Cardiovascular:     Rate and Rhythm: Normal rate. Rhythm irregular.     Heart sounds: Normal heart sounds. No murmur heard. Pulmonary:     Breath sounds: Normal breath sounds. No wheezing  or rales.  Musculoskeletal:     Cervical back: Neck supple. No tenderness.     Lumbar back: Tenderness present. Decreased range of motion.     Right lower leg: No edema.     Left lower leg: No edema.  Skin:    General: Skin is warm.     Findings: No rash.  Neurological:     General: No focal deficit present.     Mental Status: She is alert and oriented to person, place, and time.     Sensory: No sensory deficit.     Motor: No weakness.  Psychiatric:        Mood and Affect: Mood normal.        Behavior: Behavior normal.     BP (!) 126/53   Pulse 63   Ht 5\' 6"  (1.676 m)   Wt 193 lb (87.5 kg)   SpO2 96%   BMI 31.15 kg/m  Wt Readings from Last 3 Encounters:  10/31/23 193 lb (87.5 kg)  10/14/23 192 lb 12.7 oz (87.5 kg)  10/12/23 192 lb 12.8 oz (87.5 kg)    Lab Results  Component Value Date   TSH 1.030 07/12/2023   Lab Results  Component Value Date   WBC 4.2 10/15/2023   HGB 7.5 (L) 10/15/2023   HCT 23.7 (L) 10/15/2023   MCV 89.8 10/15/2023   PLT 125 (L) 10/15/2023   Lab Results  Component Value Date   NA 137 10/14/2023   K 4.0 10/14/2023   CO2 24 10/14/2023   GLUCOSE 252 (H) 10/14/2023   BUN 34 (H) 10/14/2023   CREATININE 1.26 (H) 10/14/2023   BILITOT 0.5 10/14/2023   ALKPHOS 79 10/14/2023   AST 17 10/14/2023   ALT 13 10/14/2023   PROT 7.5 10/14/2023   ALBUMIN 3.1 (L) 10/14/2023   CALCIUM 8.4 (L) 10/14/2023   ANIONGAP 8 10/14/2023  EGFR 37 (L) 10/12/2023   GFR 63.85 03/17/2015   Lab Results  Component Value Date   CHOL 188 07/12/2023   Lab Results  Component Value Date   HDL 61 07/12/2023   Lab Results  Component Value Date   LDLCALC 113 (H) 07/12/2023   Lab Results  Component Value Date   TRIG 77 07/12/2023   Lab Results  Component Value Date   CHOLHDL 3.1 07/12/2023   Lab Results  Component Value Date   HGBA1C 6.9 (H) 10/12/2023      Assessment & Plan:   Problem List Items Addressed This Visit       Cardiovascular and  Mediastinum   PAF (paroxysmal atrial fibrillation) (HCC)   On flecainide  and Xarelto , has run out of Xarelto  due to cost concern - advised to contact cardiology office Followed by cardiology      Relevant Medications   rivaroxaban  (XARELTO ) 20 MG TABS tablet     Digestive   Peptic ulcer   Was found during EGD, s/p clip placement On Pantoprazole  40 mg BID Avoid hot and spicy food      Upper GI bleed - Primary   EGD showed old clot and gastric ulcer, s/p clip On Pantoprazole  40 mg BID Maintain adequate hydration      Relevant Orders   CBC with Differential/Platelet     Genitourinary   Stage 3a chronic kidney disease (HCC)   Last BMP reviewed, GFR stable at 42 Avoid nephrotoxic agents On ARB Checked urine microalbumin/creatinine ratio Check BMP today      Relevant Orders   Basic Metabolic Panel (BMET)     Other   Acute blood loss anemia (ABLA)   She had coffee-ground emesis and melanotic stools, positive fecal occult blood test - Hemoglobin down roughly 3 g in the last 3 months. - Hemodynamically stable S/p 1 U PRBC transfusion Check CBC Advised to take oral iron supplement and Vitamin B12 for now      Hospital discharge follow-up   Hospital chart reviewed, including discharge summary Medications reconciled and reviewed with the patient in detail       No orders of the defined types were placed in this encounter.   Follow-up: Return if symptoms worsen or fail to improve.    Meldon Sport, MD

## 2023-10-31 NOTE — Assessment & Plan Note (Signed)
 Last BMP reviewed, GFR stable at 42 Avoid nephrotoxic agents On ARB Checked urine microalbumin/creatinine ratio Check BMP today

## 2023-10-31 NOTE — Assessment & Plan Note (Signed)
 She had coffee-ground emesis and melanotic stools, positive fecal occult blood test - Hemoglobin down roughly 3 g in the last 3 months. - Hemodynamically stable S/p 1 U PRBC transfusion Check CBC Advised to take oral iron supplement and Vitamin B12 for now

## 2023-10-31 NOTE — Assessment & Plan Note (Signed)
 Hospital chart reviewed, including discharge summary Medications reconciled and reviewed with the patient in detail

## 2023-10-31 NOTE — Assessment & Plan Note (Signed)
 EGD showed old clot and gastric ulcer, s/p clip On Pantoprazole  40 mg BID Maintain adequate hydration

## 2023-11-01 ENCOUNTER — Ambulatory Visit: Payer: Self-pay | Admitting: Internal Medicine

## 2023-11-01 LAB — CBC WITH DIFFERENTIAL/PLATELET
Basophils Absolute: 0 10*3/uL (ref 0.0–0.2)
Basos: 0 %
EOS (ABSOLUTE): 0 10*3/uL (ref 0.0–0.4)
Eos: 0 %
Hematocrit: 36.2 % (ref 34.0–46.6)
Hemoglobin: 11.1 g/dL (ref 11.1–15.9)
Immature Grans (Abs): 0 10*3/uL (ref 0.0–0.1)
Immature Granulocytes: 0 %
Lymphocytes Absolute: 1.8 10*3/uL (ref 0.7–3.1)
Lymphs: 39 %
MCH: 28.2 pg (ref 26.6–33.0)
MCHC: 30.7 g/dL — ABNORMAL LOW (ref 31.5–35.7)
MCV: 92 fL (ref 79–97)
Monocytes Absolute: 0.3 10*3/uL (ref 0.1–0.9)
Monocytes: 6 %
Neutrophils Absolute: 2.5 10*3/uL (ref 1.4–7.0)
Neutrophils: 55 %
Platelets: 252 10*3/uL (ref 150–450)
RBC: 3.94 x10E6/uL (ref 3.77–5.28)
RDW: 13.6 % (ref 11.7–15.4)
WBC: 4.5 10*3/uL (ref 3.4–10.8)

## 2023-11-01 LAB — BASIC METABOLIC PANEL WITH GFR
BUN/Creatinine Ratio: 13 (ref 12–28)
BUN: 18 mg/dL (ref 8–27)
CO2: 22 mmol/L (ref 20–29)
Calcium: 8.9 mg/dL (ref 8.7–10.3)
Chloride: 102 mmol/L (ref 96–106)
Creatinine, Ser: 1.41 mg/dL — ABNORMAL HIGH (ref 0.57–1.00)
Glucose: 211 mg/dL — ABNORMAL HIGH (ref 70–99)
Potassium: 5 mmol/L (ref 3.5–5.2)
Sodium: 140 mmol/L (ref 134–144)
eGFR: 37 mL/min/{1.73_m2} — ABNORMAL LOW (ref >59)

## 2023-11-03 NOTE — Telephone Encounter (Signed)
 Called the patient to follow up since we have not heard back about their application

## 2023-11-07 ENCOUNTER — Other Ambulatory Visit (HOSPITAL_COMMUNITY): Payer: Self-pay

## 2023-11-07 NOTE — Telephone Encounter (Signed)
 Hi, in media under "xarelto  blank provider form" is the form if someone can get the provider to sign and fax back to us  at [phone]. Thank you!      I have patient portion now

## 2023-11-08 NOTE — Telephone Encounter (Signed)
 Provider back in [place] office on 5/22- will have him address at that time.

## 2023-11-10 NOTE — Telephone Encounter (Signed)
 Faxed [employer] application

## 2023-11-15 NOTE — Telephone Encounter (Signed)
 PAP: Patient assistance application for Xarelto  has been approved by PAP Companies: [employer] from 11/11/23 to 06/20/24. Medication should be delivered to PAP Delivery: Home. For further shipping updates, please contact [employer] ([employer]) at [phone].    Scheduled to be filled 11/20/23. I called the patient to make her aware but no answer.

## 2023-11-24 ENCOUNTER — Encounter (INDEPENDENT_AMBULATORY_CARE_PROVIDER_SITE_OTHER): Payer: Self-pay | Admitting: *Deleted

## 2024-01-04 DIAGNOSIS — R059 Cough, unspecified: Secondary | ICD-10-CM | POA: Diagnosis not present

## 2024-01-04 DIAGNOSIS — I517 Cardiomegaly: Secondary | ICD-10-CM | POA: Diagnosis not present

## 2024-01-04 DIAGNOSIS — R918 Other nonspecific abnormal finding of lung field: Secondary | ICD-10-CM | POA: Diagnosis not present

## 2024-01-04 DIAGNOSIS — R509 Fever, unspecified: Secondary | ICD-10-CM | POA: Diagnosis not present

## 2024-01-04 DIAGNOSIS — I700 Atherosclerosis of aorta: Secondary | ICD-10-CM | POA: Diagnosis not present

## 2024-02-08 ENCOUNTER — Ambulatory Visit: Payer: Medicare (Managed Care) | Admitting: Internal Medicine

## 2024-02-08 ENCOUNTER — Encounter: Payer: Self-pay | Admitting: Internal Medicine

## 2024-02-08 VITALS — BP 138/68 | HR 69 | Ht 66.0 in | Wt 190.8 lb

## 2024-02-08 DIAGNOSIS — K279 Peptic ulcer, site unspecified, unspecified as acute or chronic, without hemorrhage or perforation: Secondary | ICD-10-CM

## 2024-02-08 DIAGNOSIS — Z7984 Long term (current) use of oral hypoglycemic drugs: Secondary | ICD-10-CM | POA: Diagnosis not present

## 2024-02-08 DIAGNOSIS — E1169 Type 2 diabetes mellitus with other specified complication: Secondary | ICD-10-CM

## 2024-02-08 DIAGNOSIS — I10 Essential (primary) hypertension: Secondary | ICD-10-CM

## 2024-02-08 DIAGNOSIS — I480 Paroxysmal atrial fibrillation: Secondary | ICD-10-CM

## 2024-02-08 DIAGNOSIS — N1832 Chronic kidney disease, stage 3b: Secondary | ICD-10-CM

## 2024-02-08 DIAGNOSIS — E782 Mixed hyperlipidemia: Secondary | ICD-10-CM | POA: Diagnosis not present

## 2024-02-08 DIAGNOSIS — M48061 Spinal stenosis, lumbar region without neurogenic claudication: Secondary | ICD-10-CM

## 2024-02-08 MED ORDER — PANTOPRAZOLE SODIUM 40 MG PO TBEC: 40.0000 mg | DELAYED_RELEASE_TABLET | Freq: Two times a day (BID) | ORAL | 5 refills | Status: AC

## 2024-02-08 MED ORDER — SIMVASTATIN 40 MG PO TABS: 40.0000 mg | ORAL_TABLET | Freq: Every day | ORAL | 3 refills | Status: AC

## 2024-02-08 MED ORDER — HYDRALAZINE HCL 50 MG PO TABS: 50.0000 mg | ORAL_TABLET | Freq: Three times a day (TID) | ORAL | 1 refills | Status: DC

## 2024-02-08 MED ORDER — IRBESARTAN 300 MG PO TABS: 300.0000 mg | ORAL_TABLET | Freq: Every day | ORAL | 3 refills | Status: AC

## 2024-02-08 MED ORDER — GLIPIZIDE 10 MG PO TABS: ORAL_TABLET | ORAL | 3 refills | Status: AC

## 2024-02-08 NOTE — Assessment & Plan Note (Signed)
 On flecainide and Xarelto, has run out of Xarelto due to cost concern - advised to contact cardiology office Followed by cardiology

## 2024-02-08 NOTE — Assessment & Plan Note (Signed)
 Lab Results  Component Value Date   HGBA1C 6.9 (H) 10/12/2023   Well-controlled Associated with HTN and HLD On glipizide  10 mg twice daily Has stopped taking metformin  1000 mg once daily, and does not want to take it now despite counseling Advised to follow diabetic diet On statin and ARB F/u CMP and lipid panel Diabetic eye exam: Advised to follow up with Ophthalmology for diabetic eye exam

## 2024-02-08 NOTE — Patient Instructions (Signed)
 Please take Irbesartan  and Hydralazine  regularly for blood pressure.  Please start taking Simvastatin  40 mg once daily instead of 20 mg.  Please continue to take medications as prescribed.  Please continue to follow low carb diet and ambulate as tolerated.

## 2024-02-08 NOTE — Progress Notes (Signed)
 Established Patient Office Visit  Subjective:  Patient ID: Catherine Joseph, female    DOB: Oct 01, 1940  Age: 83 y.o. MRN: [id number]  CC:  Chief Complaint  Patient presents with   Diabetes    Follow up   Back Pain    Sx of back pain.     HPI Catherine Joseph is a 83 y.o. female with past medical history of atrial fibrillation, HTN, type II DM, HLD and obesity who presents for f/u of her chronic medical conditions.  HTN and atrial fibrillation: Her BP is elevated today as she has not taken her medications today. She has had better BP readings at home. She attributes it to anxiety.  She is currently taking irbesartan  300 mg QD and Hydralazine  50 mg 3 times daily. She is taking flecainide  for A-fib, but has not been able to get Xarelto  due to cost concern.  She is followed by Dr. Nishan for it.  She denies any headache, dizziness, chest pain, dyspnea or, palpitations.  Type II DM: She is on glipizide  10 mg twice daily currently.  She preferred not to take metformin .  Her HbA1c was stable at 6.9 in 04/25. Denies any polyuria or polyphagia currently.  She is also on simvastatin  20 mg QD for HLD.  Her last BMP showed GFR at 37, slightly worse compared to prior.  She currently denies any dysuria, hematuria or urinary hesitancy or resistance.  Back pain: She reports chronic low back pain, intermittent, worse with bending.  She has tried Tylenol  with mild relief.  She has also tried Voltaren  gel.  Denies any numbness or tingling of the LE.  She had MRI of lumbar spine in 2023, which showed L4-5 spinal canal stenosis and neural foraminal narrowing at L4-5, L5-S1 levels. She had adequate relief with Tramadol , but has not taken it recently.   Past Medical History:  Diagnosis Date   Anemia    Arthritis    Bradycardia    Cataract    Chronic kidney disease    Diabetes mellitus type II    Hypertension    Osteoporosis    Syncope    Weakness     Past Surgical History:  Procedure  Laterality Date   ABDOMINAL HYSTERECTOMY     CESAREAN SECTION     COLON SURGERY     ESOPHAGOGASTRODUODENOSCOPY N/A 10/14/2023   Procedure: EGD (ESOPHAGOGASTRODUODENOSCOPY);  Surgeon: Shaaron Lamar HERO, MD;  Location: [facility];  Service: Endoscopy;  Laterality: N/A;   EYE SURGERY     FRACTURE SURGERY      Family History  Problem Relation Age of Onset   Diabetes Sister    Hyperlipidemia Sister    Hypertension Sister    Diabetes Brother    Hyperlipidemia Brother    Hypertension Brother     Social History   Socioeconomic History   Marital status: Widowed    Spouse name: Not on file   Number of children: Not on file   Years of education: Not on file   Highest education level: Not on file  Occupational History   Not on file  Tobacco Use   Smoking status: Never   Smokeless tobacco: Never  Vaping Use   Vaping status: Never Used  Substance and Sexual Activity   Alcohol  use: No   Drug use: No   Sexual activity: Not Currently  Other Topics Concern   Not on file  Social History Narrative   ambudexterty   No caffeine   Two story  home    Social Drivers of Health   Financial Resource Strain: Low Risk  (08/01/2023)   Overall Financial Resource Strain (CARDIA)    Difficulty of Paying Living Expenses: Not hard at all  Food Insecurity: No Food Insecurity (10/14/2023)   Hunger Vital Sign    Worried About Running Out of Food in the Last Year: Never true    Ran Out of Food in the Last Year: Never true  Recent Concern: Food Insecurity - Food Insecurity Present (08/01/2023)   Hunger Vital Sign    Worried About Running Out of Food in the Last Year: Sometimes true    Ran Out of Food in the Last Year: Sometimes true  Transportation Needs: No Transportation Needs (10/14/2023)   PRAPARE - Administrator, Civil Service (Medical): No    Lack of Transportation (Non-Medical): No  Recent Concern: Transportation Needs - Unmet Transportation Needs (08/01/2023)   PRAPARE -  Transportation    Lack of Transportation (Medical): Yes    Lack of Transportation (Non-Medical): Yes  Physical Activity: Inactive (08/01/2023)   Exercise Vital Sign    Days of Exercise per Week: 0 days    Minutes of Exercise per Session: 0 min  Stress: No Stress Concern Present (08/01/2023)   [employer] of Occupational Health - Occupational Stress Questionnaire    Feeling of Stress : Not at all  Social Connections: Moderately Integrated (10/14/2023)   Social Connection and Isolation Panel    Frequency of Communication with Friends and Family: Three times a week    Frequency of Social Gatherings with Friends and Family: More than three times a week    Attends Religious Services: More than 4 times per year    Active Member of Clubs or Organizations: Yes    Attends Banker Meetings: More than 4 times per year    Marital Status: Widowed  Intimate Partner Violence: Not At Risk (10/14/2023)   Humiliation, Afraid, Rape, and Kick questionnaire    Fear of Current or Ex-Partner: No    Emotionally Abused: No    Physically Abused: No    Sexually Abused: No    Outpatient Medications Prior to Visit  Medication Sig Dispense Refill   ACCU-CHEK AVIVA PLUS test strip TEST BLOOD SUGAR ONE TIME DAILY 100 strip 1   Accu-Chek Softclix Lancets lancets TEST BLOOD SUGAR ONE TIME DAILY 100 each 3   acetaminophen  (TYLENOL ) 500 MG tablet Take 1 tablet (500 mg total) by mouth every 6 (six) hours as needed. 30 tablet 0   Alcohol  Swabs (B-D SINGLE USE SWABS REGULAR) PADS USE TO CHECK BLOOD SUGAR DAILY 100 each 3   Blood Glucose Calibration (SURECHEK CONTROL SOLUTION) Normal LIQD Use to check blood sugar daily. Dx E11.65 1 each 3   Blood Glucose Monitoring Suppl (ACCU-CHEK AVIVA PLUS) w/Device KIT USE AS DIRECTED 1 kit 5   flecainide  (TAMBOCOR ) 100 MG tablet Take 0.5 tablets (50 mg total) by mouth 2 (two) times daily. (Patient taking differently: Take 100 mg by mouth at bedtime.) 90 tablet 3    meclizine  (ANTIVERT ) 25 MG tablet TAKE 1 TABLET TWICE DAILY AS NEEDED FOR DIZZINESS (Patient taking differently: Take 25 mg by mouth 2 (two) times daily as needed for dizziness.) 60 tablet 3   nystatin  cream (MYCOSTATIN ) APPLY 1 APPLICATION TOPICALLY 2 (TWO) TIMES DAILY. 30 g 11   rivaroxaban  (XARELTO ) 20 MG TABS tablet Take 20 mg by mouth daily with supper.     traMADol  (ULTRAM ) 50 MG tablet  Take 1 tablet (50 mg total) by mouth every 12 (twelve) hours as needed for severe pain (pain score 7-10). 30 tablet 0   glipiZIDE  (GLUCOTROL ) 10 MG tablet TAKE 1 TABLET TWICE DAILY BEFORE MEALS (Patient taking differently: Take 20 mg by mouth daily before breakfast.) 180 tablet 3   hydrALAZINE  (APRESOLINE ) 50 MG tablet Take 1 tablet (50 mg total) by mouth 3 (three) times daily. 270 tablet 1   irbesartan  (AVAPRO ) 300 MG tablet TAKE 1 TABLET EVERY DAY 90 tablet 3   pantoprazole  (PROTONIX ) 40 MG tablet Take 1 tablet (40 mg total) by mouth 2 (two) times daily. 60 tablet 3   simvastatin  (ZOCOR ) 20 MG tablet Take 1 tablet (20 mg total) by mouth daily at 6 PM. 90 tablet 3   No facility-administered medications prior to visit.    Allergies  Allergen Reactions   Codeine Rash and Other (See Comments)    Vertigo     ROS Review of Systems  Constitutional:  Negative for chills and fever.  HENT:  Negative for congestion, sinus pressure, sinus pain and sore throat.   Eyes:  Negative for pain and discharge.  Respiratory:  Negative for cough and shortness of breath.   Cardiovascular:  Negative for chest pain and palpitations.  Gastrointestinal:  Negative for diarrhea, nausea and vomiting.  Endocrine: Negative for polydipsia and polyuria.  Genitourinary:  Negative for dysuria and hematuria.  Musculoskeletal:  Positive for back pain. Negative for neck pain and neck stiffness.  Skin:  Negative for rash.  Neurological:  Negative for dizziness and weakness.  Psychiatric/Behavioral:  Negative for agitation and  behavioral problems.       Objective:    Physical Exam Vitals reviewed.  Constitutional:      General: She is not in acute distress.    Appearance: She is obese. She is not diaphoretic.  HENT:     Head: Normocephalic and atraumatic.     Nose: Nose normal.     Mouth/Throat:     Mouth: Mucous membranes are moist.  Eyes:     General: No scleral icterus.    Extraocular Movements: Extraocular movements intact.  Cardiovascular:     Rate and Rhythm: Normal rate. Rhythm irregular.     Heart sounds: Normal heart sounds. No murmur heard. Pulmonary:     Breath sounds: Normal breath sounds. No wheezing or rales.  Musculoskeletal:     Cervical back: Neck supple. No tenderness.     Lumbar back: Tenderness present. Decreased range of motion.     Right lower leg: No edema.     Left lower leg: No edema.  Skin:    General: Skin is warm.     Findings: No rash.  Neurological:     General: No focal deficit present.     Mental Status: She is alert and oriented to person, place, and time.     Sensory: No sensory deficit.     Motor: No weakness.  Psychiatric:        Mood and Affect: Mood normal.        Behavior: Behavior normal.     BP 138/68 (BP Location: Left Arm) Comment: At home  Pulse 69   Ht 5' 6 (1.676 m)   Wt 190 lb 12.8 oz (86.5 kg)   SpO2 94%   BMI 30.80 kg/m  Wt Readings from Last 3 Encounters:  02/08/24 190 lb 12.8 oz (86.5 kg)  10/31/23 193 lb (87.5 kg)  10/14/23 192 lb 12.7 oz (87.5 kg)  Lab Results  Component Value Date   TSH 1.030 07/12/2023   Lab Results  Component Value Date   WBC 4.5 10/31/2023   HGB 11.1 10/31/2023   HCT 36.2 10/31/2023   MCV 92 10/31/2023   PLT 252 10/31/2023   Lab Results  Component Value Date   NA 140 10/31/2023   K 5.0 10/31/2023   CO2 22 10/31/2023   GLUCOSE 211 (H) 10/31/2023   BUN 18 10/31/2023   CREATININE 1.41 (H) 10/31/2023   BILITOT 0.5 10/14/2023   ALKPHOS 79 10/14/2023   AST 17 10/14/2023   ALT 13 10/14/2023    PROT 7.5 10/14/2023   ALBUMIN 3.1 (L) 10/14/2023   CALCIUM 8.9 10/31/2023   ANIONGAP 8 10/14/2023   EGFR 37 (L) 10/31/2023   GFR 63.85 03/17/2015   Lab Results  Component Value Date   CHOL 188 07/12/2023   Lab Results  Component Value Date   HDL 61 07/12/2023   Lab Results  Component Value Date   LDLCALC 113 (H) 07/12/2023   Lab Results  Component Value Date   TRIG 77 07/12/2023   Lab Results  Component Value Date   CHOLHDL 3.1 07/12/2023   Lab Results  Component Value Date   HGBA1C 6.9 (H) 10/12/2023      Assessment & Plan:   Problem List Items Addressed This Visit       Cardiovascular and Mediastinum   Essential hypertension   BP Readings from Last 1 Encounters:  02/08/24 138/68   Usually well-controlled with irbesartan  300 mg QD and hydralazine  50 mg TID Counseled for compliance with the medications Advised DASH diet and moderate exercise/walking as tolerated      Relevant Medications   simvastatin  (ZOCOR ) 40 MG tablet   hydrALAZINE  (APRESOLINE ) 50 MG tablet   irbesartan  (AVAPRO ) 300 MG tablet   Other Relevant Orders   CMP14+EGFR   CBC with Differential/Platelet   PAF (paroxysmal atrial fibrillation) (HCC)   On flecainide  and Xarelto , has run out of Xarelto  due to cost concern - advised to contact cardiology office Followed by cardiology      Relevant Medications   simvastatin  (ZOCOR ) 40 MG tablet   hydrALAZINE  (APRESOLINE ) 50 MG tablet   irbesartan  (AVAPRO ) 300 MG tablet     Digestive   Peptic ulcer   Was found during EGD, s/p clip placement On Pantoprazole  40 mg BID Avoid hot and spicy food Referred to GI for follow-up of PUD      Relevant Medications   pantoprazole  (PROTONIX ) 40 MG tablet   Other Relevant Orders   Ambulatory referral to Gastroenterology     Endocrine   Type 2 diabetes mellitus with other specified complication (HCC) - Primary   Lab Results  Component Value Date   HGBA1C 6.9 (H) 10/12/2023    Well-controlled Associated with HTN and HLD On glipizide  10 mg twice daily Has stopped taking metformin  1000 mg once daily, and does not want to take it now despite counseling Advised to follow diabetic diet On statin and ARB F/u CMP and lipid panel Diabetic eye exam: Advised to follow up with Ophthalmology for diabetic eye exam      Relevant Medications   simvastatin  (ZOCOR ) 40 MG tablet   irbesartan  (AVAPRO ) 300 MG tablet   glipiZIDE  (GLUCOTROL ) 10 MG tablet   Other Relevant Orders   CMP14+EGFR   Hemoglobin A1c     Musculoskeletal and Integument   Neural foraminal stenosis of lumbar spine   Her flank pain/back pain is likely  due to DDD of lumbar spine in addition to muscular strain Tylenol  arthritis as needed for mild to moderate Avoid oral NSAIDs due to CKD and anticoagulation Tramadol  as needed for severe pain - advised it can cause sedation, avoid taking with Flexeril  Flexeril  as needed for back muscle spasms Avoid heavy lifting and frequent bending Had referred to Physiatry - discuss about epidural injection, but did not follow up        Genitourinary   Chronic kidney disease, stage 3b (HCC)   Last BMP reviewed, GFR stable around 40 Avoid nephrotoxic agents On ARB Checked urine microalbumin/creatinine ratio Check CMP today        Other   Mixed hyperlipidemia   On simvastatin  20 mg QD Check lipid profile - LDL above goal, but needs to improve diet Increased dose of simvastatin  to 40 mg QD      Relevant Medications   simvastatin  (ZOCOR ) 40 MG tablet   hydrALAZINE  (APRESOLINE ) 50 MG tablet   irbesartan  (AVAPRO ) 300 MG tablet       Meds ordered this encounter  Medications   simvastatin  (ZOCOR ) 40 MG tablet    Sig: Take 1 tablet (40 mg total) by mouth daily at 6 PM.    Dispense:  90 tablet    Refill:  3   pantoprazole  (PROTONIX ) 40 MG tablet    Sig: Take 1 tablet (40 mg total) by mouth 2 (two) times daily.    Dispense:  60 tablet    Refill:  5    hydrALAZINE  (APRESOLINE ) 50 MG tablet    Sig: Take 1 tablet (50 mg total) by mouth 3 (three) times daily.    Dispense:  270 tablet    Refill:  1   irbesartan  (AVAPRO ) 300 MG tablet    Sig: Take 1 tablet (300 mg total) by mouth daily.    Dispense:  90 tablet    Refill:  3   glipiZIDE  (GLUCOTROL ) 10 MG tablet    Sig: TAKE 1 TABLET TWICE DAILY BEFORE MEALS    Dispense:  180 tablet    Refill:  3    Follow-up: Return in about 4 months (around 06/09/2024) for HTN and DM.    Suzzane MARLA Blanch, MD

## 2024-02-08 NOTE — Assessment & Plan Note (Signed)
 Her flank pain/back pain is likely due to DDD of lumbar spine in addition to muscular strain Tylenol  arthritis as needed for mild to moderate Avoid oral NSAIDs due to CKD and anticoagulation Tramadol  as needed for severe pain - advised it can cause sedation, avoid taking with Flexeril  Flexeril  as needed for back muscle spasms Avoid heavy lifting and frequent bending Had referred to Physiatry - discuss about epidural injection, but did not follow up

## 2024-02-08 NOTE — Assessment & Plan Note (Addendum)
 BP Readings from Last 1 Encounters:  02/08/24 138/68   Usually well-controlled with irbesartan  300 mg QD and hydralazine  50 mg TID Counseled for compliance with the medications Advised DASH diet and moderate exercise/walking as tolerated

## 2024-02-08 NOTE — Assessment & Plan Note (Addendum)
 On simvastatin  20 mg QD Check lipid profile - LDL above goal, but needs to improve diet Increased dose of simvastatin  to 40 mg QD

## 2024-02-08 NOTE — Assessment & Plan Note (Signed)
 Was found during EGD, s/p clip placement On Pantoprazole  40 mg BID Avoid hot and spicy food Referred to GI for follow-up of PUD

## 2024-02-08 NOTE — Assessment & Plan Note (Addendum)
 Last BMP reviewed, GFR stable around 40 Avoid nephrotoxic agents On ARB Checked urine microalbumin/creatinine ratio Check CMP today

## 2024-02-09 ENCOUNTER — Ambulatory Visit: Payer: Self-pay | Admitting: Internal Medicine

## 2024-02-09 LAB — CBC WITH DIFFERENTIAL/PLATELET
Basophils Absolute: 0 x10E3/uL (ref 0.0–0.2)
Basos: 0 %
EOS (ABSOLUTE): 0 x10E3/uL (ref 0.0–0.4)
Eos: 1 %
Hematocrit: 37.1 % (ref 34.0–46.6)
Hemoglobin: 10.9 g/dL — ABNORMAL LOW (ref 11.1–15.9)
Immature Grans (Abs): 0 x10E3/uL (ref 0.0–0.1)
Immature Granulocytes: 0 %
Lymphocytes Absolute: 2.4 x10E3/uL (ref 0.7–3.1)
Lymphs: 54 %
MCH: 25.3 pg — ABNORMAL LOW (ref 26.6–33.0)
MCHC: 29.4 g/dL — ABNORMAL LOW (ref 31.5–35.7)
MCV: 86 fL (ref 79–97)
Monocytes Absolute: 0.3 x10E3/uL (ref 0.1–0.9)
Monocytes: 7 %
Neutrophils Absolute: 1.7 x10E3/uL (ref 1.4–7.0)
Neutrophils: 38 %
Platelets: 234 x10E3/uL (ref 150–450)
RBC: 4.3 x10E6/uL (ref 3.77–5.28)
RDW: 15.9 % — ABNORMAL HIGH (ref 11.7–15.4)
WBC: 4.5 x10E3/uL (ref 3.4–10.8)

## 2024-02-09 LAB — CMP14+EGFR
ALT: 11 IU/L (ref 0–32)
AST: 18 IU/L (ref 0–40)
Albumin: 3.8 g/dL (ref 3.7–4.7)
Alkaline Phosphatase: 119 IU/L (ref 44–121)
BUN/Creatinine Ratio: 13 (ref 12–28)
BUN: 15 mg/dL (ref 8–27)
Bilirubin Total: 0.7 mg/dL (ref 0.0–1.2)
CO2: 24 mmol/L (ref 20–29)
Calcium: 8.9 mg/dL (ref 8.7–10.3)
Chloride: 98 mmol/L (ref 96–106)
Creatinine, Ser: 1.12 mg/dL — ABNORMAL HIGH (ref 0.57–1.00)
Globulin, Total: 5.1 g/dL — ABNORMAL HIGH (ref 1.5–4.5)
Glucose: 243 mg/dL — ABNORMAL HIGH (ref 70–99)
Potassium: 4 mmol/L (ref 3.5–5.2)
Sodium: 135 mmol/L (ref 134–144)
Total Protein: 8.9 g/dL — ABNORMAL HIGH (ref 6.0–8.5)
eGFR: 48 mL/min/1.73 — ABNORMAL LOW (ref >59)

## 2024-02-09 LAB — HEMOGLOBIN A1C
Est. average glucose Bld gHb Est-mCnc: 163 mg/dL
Hgb A1c MFr Bld: 7.3 % — ABNORMAL HIGH (ref 4.8–5.6)

## 2024-03-18 ENCOUNTER — Other Ambulatory Visit: Payer: Self-pay | Admitting: Cardiovascular Disease

## 2024-03-18 DIAGNOSIS — I480 Paroxysmal atrial fibrillation: Secondary | ICD-10-CM

## 2024-03-26 NOTE — Progress Notes (Unsigned)
 GI Office Note    Referring Provider: Tobie Suzzane POUR, MD Primary Care Physician:  Tobie Suzzane POUR, MD Primary Gastroenterologist: Lamar HERO.Rourk, MD  Date:  03/27/2024  ID:  Catherine Joseph, Catherine Joseph [birth year], MRN [id number]  Chief Complaint   Chief Complaint  Patient presents with   Emesis    Vomited blood once last year.    History of Present Illness  Catherine Joseph is a 83 y.o. female with a history of A-fib on Xarelto , CKD, type 2 diabetes, HLD, and HTN presenting today for hospital follow up.   Seen by our service inpatient in April 2025.  Presented to the ED with reports of hematemesis and melena at home.  Noted not be taking her Xarelto  for about 3 months prior.  Stool was heme positive.  She had about a 3 point drop in hemoglobin within 3 months. Hgb 13.6>> 10.4.  She reported an episode of coffee-ground emesis times 2 in the morning upon waking and reportedly also been having some black stools prior to this with some mild abdominal pain.  Denied NSAIDs, tobacco use, or EtOH.  Having some anxiety as well as some hypertension at home.  Given symptoms she was scheduled for EGD the following day.  Underwent EGD as noted below.  After EGD performed she as well as family were advised to check her medicine cabinet at home and avoid any NSAIDs and continue pantoprazole  twice daily and to follow-up in the office in 12 weeks and recommended repeat EGD to verify ulcer healing.  EGD 10/14/23: - Normal esophagus - Large amount of clot and old blood in the stomach - 8 mm and 4 mm ulceration in the greater curvature s/p biopsy - Associated linear erosions s/p biopsy - Normal duodenum - Unable to exclude Mallory-Weiss tear given heaving and presence of ulcers - Daughter believes patient may have been taking Motrin. - Advised PPI twice daily and avoidance of NSAIDs  Labs 02/08/2024: Hemoglobin 10.9, glucose 243, creatinine 1.12, GFR 48.  Normal LFTs.  A1c 7.3 (average glucose  163)  Today:  Discussed the use of AI scribe software for clinical note transcription with the patient, who gave verbal consent to proceed.  In April 2025, she was hospitalized due to hematemesis and melena. During this time, she had not been taking her prescribed Xarelto  for about three months. Her family reported that she was likely taking Motrin at home per review of her medicine cabinet. An upper endoscopy revealed a large amount of clot and blood in the stomach, along with 8 mm and 4 mm ulcerations in the greater curvature. Biopsies were benign.  Since her hospitalization, she has not experienced any further episodes of hematemesis or melena. No current nausea, dysphagia, or changes in bowel habits. Her stools have been normal without any blood or black discoloration. She is currently taking Tylenol  for back pain and has not resumed taking Motrin.  She has not restarted Xarelto  per her report due to cost concerns. No chest pain, shortness of breath, weight loss or changes in appetite. Denies any abdominal pain.      Wt Readings from Last 5 Encounters:  03/27/24 190 lb 9.6 oz (86.5 kg)  02/08/24 190 lb 12.8 oz (86.5 kg)  10/31/23 193 lb (87.5 kg)  10/14/23 192 lb 12.7 oz (87.5 kg)  10/12/23 192 lb 12.8 oz (87.5 kg)    Current Outpatient Medications  Medication Sig Dispense Refill   ACCU-CHEK AVIVA PLUS test strip TEST BLOOD SUGAR ONE TIME  DAILY 100 strip 1   Accu-Chek Softclix Lancets lancets TEST BLOOD SUGAR ONE TIME DAILY 100 each 3   acetaminophen  (TYLENOL ) 500 MG tablet Take 1 tablet (500 mg total) by mouth every 6 (six) hours as needed. 30 tablet 0   Alcohol  Swabs (B-D SINGLE USE SWABS REGULAR) PADS USE TO CHECK BLOOD SUGAR DAILY 100 each 3   Blood Glucose Calibration (SURECHEK CONTROL SOLUTION) Normal LIQD Use to check blood sugar daily. Dx E11.65 1 each 3   Blood Glucose Monitoring Suppl (ACCU-CHEK AVIVA PLUS) w/Device KIT USE AS DIRECTED 1 kit 5   glipiZIDE  (GLUCOTROL ) 10 MG  tablet TAKE 1 TABLET TWICE DAILY BEFORE MEALS 180 tablet 3   hydrALAZINE  (APRESOLINE ) 50 MG tablet Take 1 tablet (50 mg total) by mouth 3 (three) times daily. 270 tablet 1   irbesartan  (AVAPRO ) 300 MG tablet Take 1 tablet (300 mg total) by mouth daily. 90 tablet 3   meclizine  (ANTIVERT ) 25 MG tablet TAKE 1 TABLET TWICE DAILY AS NEEDED FOR DIZZINESS (Patient taking differently: 3 (three) times daily as needed.) 60 tablet 3   nystatin  cream (MYCOSTATIN ) APPLY 1 APPLICATION TOPICALLY 2 (TWO) TIMES DAILY. 30 g 11   pantoprazole  (PROTONIX ) 40 MG tablet Take 1 tablet (40 mg total) by mouth 2 (two) times daily. 60 tablet 5   simvastatin  (ZOCOR ) 40 MG tablet Take 1 tablet (40 mg total) by mouth daily at 6 PM. 90 tablet 3   flecainide  (TAMBOCOR ) 100 MG tablet TAKE 1/2 TABLET(50 MG) BY MOUTH TWICE DAILY (Patient not taking: Reported on 03/27/2024) 90 tablet 1   rivaroxaban  (XARELTO ) 20 MG TABS tablet Take 20 mg by mouth daily with supper. (Patient not taking: Reported on 03/27/2024)     traMADol  (ULTRAM ) 50 MG tablet Take 1 tablet (50 mg total) by mouth every 12 (twelve) hours as needed for severe pain (pain score 7-10). (Patient not taking: Reported on 03/27/2024) 30 tablet 0   No current facility-administered medications for this visit.    Past Medical History:  Diagnosis Date   Anemia    Arthritis    Bradycardia    Cataract    Chronic kidney disease    Diabetes mellitus type II    Hypertension    Osteoporosis    Syncope    Weakness     Past Surgical History:  Procedure Laterality Date   ABDOMINAL HYSTERECTOMY     CESAREAN SECTION     COLON SURGERY     ESOPHAGOGASTRODUODENOSCOPY N/A 10/14/2023   Procedure: EGD (ESOPHAGOGASTRODUODENOSCOPY);  Surgeon: Shaaron Lamar HERO, MD;  Location: [facility];  Service: Endoscopy;  Laterality: N/A;   EYE SURGERY     FRACTURE SURGERY      Family History  Problem Relation Age of Onset   Diabetes Sister    Hyperlipidemia Sister    Hypertension Sister     Diabetes Brother    Hyperlipidemia Brother    Hypertension Brother     Allergies as of 03/27/2024 - Review Complete 03/27/2024  Allergen Reaction Noted   Codeine Rash and Other (See Comments) 07/22/2011    Social History   Socioeconomic History   Marital status: Widowed    Spouse name: Not on file   Number of children: Not on file   Years of education: Not on file   Highest education level: Not on file  Occupational History   Not on file  Tobacco Use   Smoking status: Never   Smokeless tobacco: Never  Vaping Use   Vaping  status: Never Used  Substance and Sexual Activity   Alcohol  use: No   Drug use: No   Sexual activity: Not Currently  Other Topics Concern   Not on file  Social History Narrative   ambudexterty   No caffeine   Two story home    Social Drivers of Corporate investment banker Strain: Low Risk  (08/01/2023)   Overall Financial Resource Strain (CARDIA)    Difficulty of Paying Living Expenses: Not hard at all  Food Insecurity: No Food Insecurity (10/14/2023)   Hunger Vital Sign    Worried About Running Out of Food in the Last Year: Never true    Ran Out of Food in the Last Year: Never true  Recent Concern: Food Insecurity - Food Insecurity Present (08/01/2023)   Hunger Vital Sign    Worried About Running Out of Food in the Last Year: Sometimes true    Ran Out of Food in the Last Year: Sometimes true  Transportation Needs: No Transportation Needs (10/14/2023)   PRAPARE - Administrator, Civil Service (Medical): No    Lack of Transportation (Non-Medical): No  Recent Concern: Transportation Needs - Unmet Transportation Needs (08/01/2023)   PRAPARE - Transportation    Lack of Transportation (Medical): Yes    Lack of Transportation (Non-Medical): Yes  Physical Activity: Inactive (08/01/2023)   Exercise Vital Sign    Days of Exercise per Week: 0 days    Minutes of Exercise per Session: 0 min  Stress: No Stress Concern Present (08/01/2023)    [employer] of Occupational Health - Occupational Stress Questionnaire    Feeling of Stress : Not at all  Social Connections: Moderately Integrated (10/14/2023)   Social Connection and Isolation Panel    Frequency of Communication with Friends and Family: Three times a week    Frequency of Social Gatherings with Friends and Family: More than three times a week    Attends Religious Services: More than 4 times per year    Active Member of Clubs or Organizations: Yes    Attends Banker Meetings: More than 4 times per year    Marital Status: Widowed   Review of Systems   Gen: Denies fever, chills, anorexia. Denies fatigue, weakness, weight loss.  CV: Denies chest pain, palpitations, syncope, peripheral edema, and claudication. Resp: Denies dyspnea at rest, cough, wheezing, coughing up blood, and pleurisy. GI: See HPI Derm: Denies rash, itching, dry skin Psych: Denies depression, anxiety, memory loss, confusion. No homicidal or suicidal ideation.  Heme: Denies bruising, bleeding, and enlarged lymph nodes.  Physical Exam   BP 136/78 (BP Location: Right Arm, Patient Position: Sitting, Cuff Size: Normal)   Pulse 82   Temp 97.7 F (36.5 C) (Temporal)   Ht 5' 3 (1.6 m)   Wt 190 lb 9.6 oz (86.5 kg)   BMI 33.76 kg/m   General:   Alert and oriented. No distress noted. Pleasant and cooperative.  Head:  Normocephalic and atraumatic. Eyes:  Conjuctiva clear without scleral icterus. Mouth:  Oral mucosa pink and moist. Good dentition. No lesions. Lungs:  Clear to auscultation bilaterally. No wheezes, rales, or rhonchi. No distress.  Heart:  S1, S2 present without murmurs appreciated. No afib. RRR Abdomen:  +BS, soft, non-tender and non-distended. No rebound or guarding. No HSM or masses noted. Rectal: deferred Msk:  Symmetrical without gross deformities. Normal posture. Extremities:  Without edema. Neurologic:  Alert and  oriented x4 Psych:  Alert and cooperative. Normal  mood and  affect.  Assessment & Plan  Catherine Joseph is a 83 y.o. female presenting today for hospital follow up.   Gastric ulcer, follow-up for healing Gastric ulcers located in the greater curvature of the stomach in April 2025, measuring 8 mm and 4 mm, with benign biopsies. Previously experienced hematemesis and melena in April 2025. Currently asymptomatic with normal bowel movements. Adhering to pantoprazole  twice daily and switched from Motrin to Tylenol  for pain management. Denies abdominal pain or further signs of anemia. - Schedule follow-up EGD to verify ulcer healing - Continue pantoprazole  40 twice daily until follow-up EGD, if clear can reduce to once daily dosing. - Avoid NSAIDs, continue using Tylenol  for pain management  Proceed with upper endoscopy with propofol  by Dr. Shaaron in near future: the risks, benefits, and alternatives have been discussed with the patient in detail. The patient states understanding and desires to proceed. ASA 3  Cardiac clearance to hold Xarelto  for 2 days.   Appears patient received assistance for medication and it was delivered to her home however patient reports she is not taking. (May need to reach her daughter to verify).   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 No current evidence of ongoing bleeding, as she reports no hematemesis, melena, or other symptoms suggestive of active bleeding. Most recent Hgb stable at 10.9 in August 2025. Not on iron therapy. - Continue PPI     Follow up   Follow up post procedure as needed.    Charmaine Melia, MSN, FNP-BC, AGACNP-BC [facility]

## 2024-03-27 ENCOUNTER — Encounter: Payer: Self-pay | Admitting: *Deleted

## 2024-03-27 ENCOUNTER — Other Ambulatory Visit: Payer: Self-pay

## 2024-03-27 ENCOUNTER — Telehealth: Payer: Self-pay | Admitting: Pharmacy Technician

## 2024-03-27 ENCOUNTER — Ambulatory Visit (INDEPENDENT_AMBULATORY_CARE_PROVIDER_SITE_OTHER): Payer: Medicare (Managed Care) | Admitting: Gastroenterology

## 2024-03-27 ENCOUNTER — Encounter: Payer: Self-pay | Admitting: Gastroenterology

## 2024-03-27 ENCOUNTER — Telehealth: Payer: Self-pay | Admitting: Cardiovascular Disease

## 2024-03-27 ENCOUNTER — Telehealth: Payer: Self-pay | Admitting: *Deleted

## 2024-03-27 VITALS — BP 136/78 | HR 82 | Temp 97.7°F | Ht 63.0 in | Wt 190.6 lb

## 2024-03-27 DIAGNOSIS — Z8719 Personal history of other diseases of the digestive system: Secondary | ICD-10-CM

## 2024-03-27 DIAGNOSIS — K253 Acute gastric ulcer without hemorrhage or perforation: Secondary | ICD-10-CM

## 2024-03-27 DIAGNOSIS — K279 Peptic ulcer, site unspecified, unspecified as acute or chronic, without hemorrhage or perforation: Secondary | ICD-10-CM

## 2024-03-27 DIAGNOSIS — K922 Gastrointestinal hemorrhage, unspecified: Secondary | ICD-10-CM

## 2024-03-27 DIAGNOSIS — D500 Iron deficiency anemia secondary to blood loss (chronic): Secondary | ICD-10-CM

## 2024-03-27 MED ORDER — RIVAROXABAN 20 MG PO TABS: 20.0000 mg | ORAL_TABLET | Freq: Every day | ORAL | 1 refills | Status: AC

## 2024-03-27 NOTE — Telephone Encounter (Signed)
 Prescription refill request for Xarelto  received.  Indication:afib Last office visit:4/25 Weight:86.5  kg Age:83 Scr:1.12  8/25 CrCl:50.15  ml/min  Prescription refilled

## 2024-03-27 NOTE — Telephone Encounter (Signed)
 Spoke with daughter (OK per DPR) RE: xarelto .  Pt has not been taking medication because she could not afford it.  According to documentation in chart, she had been approved by [employer] to receive the medication but she has never received it.  Daughter is asking since she has not received the medication she was approval for and no one notified them of the approval, if the approval can be updated and started now.  Advised I will forward to the patient assistance team to follow up and notify pt/daughter of the status.  Daughter will await a call back from pt assistance team.

## 2024-03-27 NOTE — Telephone Encounter (Signed)
 Please advise holding Xarelto  prior to EGD on 11/19  Thank you!  DW

## 2024-03-27 NOTE — Telephone Encounter (Signed)
*  STAT* If patient is at the pharmacy, call can be transferred to refill team.   1. Which medications need to be refilled? (please list name of each medication and dose if known) rivaroxaban  (XARELTO ) 20 MG TABS tablet   2. Which pharmacy/location (including street and city if local pharmacy) is medication to be sent to? [id number] - [place],  - [address]   3. Do they need a 30 day or 90 day supply? 90

## 2024-03-27 NOTE — Telephone Encounter (Signed)
  Request for patient to stop medication prior to procedure or is needing cleareance  03/27/24  Catherine Joseph January 21, 1941  What type of surgery is being performed? Esophagogastroduodenoscopy (EGD)   When is surgery scheduled? 05/09/24  What type of clearance is required (medical or pharmacy to hold medication or both? medication  Are there any medications that need to be held prior to surgery and how long? Xarelto  x 2 days.  Name of physician performing surgery?  Dr.Rourk [facility] [employer]: [phone], option 5 Fax: [phone]  Anesthesia type (none, local, MAC, general)? MAC   ? Yes ? No Patient can hold medication as requested   Signature: ___________________________

## 2024-03-27 NOTE — Patient Instructions (Addendum)
 We will get you scheduled for upper endoscopy to assess for healing of your ulcer in the near future with Dr. Shaaron.  You should continue taking your pantoprazole  40 mg twice daily until your upper endoscopy.  Pending the results you may be able to change you to once daily dosing.  Continue to only take Tylenol  for pain.  Please avoid any NSAIDs including (Motrin, Advil, Aleve, ibuprofen, meloxicam , diclofenac ) or aspirin powders (BC or Goody powders).   It was a pleasure to see you today. I want to create trusting relationships with patients. If you receive a survey regarding your visit,  I greatly appreciate you taking time to fill this out on paper or through your MyChart. I value your feedback.  Charmaine Melia, MSN, FNP-BC, AGACNP-BC [facility]

## 2024-03-27 NOTE — Telephone Encounter (Signed)
 Spoke to pt's daughter Roselind (on dpr) regarding pt taking Xarelto . She states pt is not taking the Xarelto . Pt had filled out an application for supplement program and never heard anything back. Advised her that pt was approved and medication was sent in 6/25. Told her that she needs to contact cardiology office regarding the medication. She stated she would contact cardiology office to see what is going on with medication. Will call back to schedule pt for procedure.  EGD w/Dr.Rourk, asa 3

## 2024-03-27 NOTE — Telephone Encounter (Signed)
 Pt c/o medication issue:  1. Name of Medication:   rivaroxaban  (XARELTO ) 20 MG TABS tablet    2. How are you currently taking this medication (dosage and times per day)?    3. Are you having a reaction (difficulty breathing--STAT)? no  4. What is your medication issue? Patient has been taking the medicaion for the last couple of months. It was too expensive but didn't realize they were approve for the grant for medication. Daughter is calling with concern on how it works since the patient has missed the dosage. Please advise

## 2024-03-27 NOTE — Telephone Encounter (Signed)
   I called the daughter and gave her the [place] and [employer] phone number. I myself had a hard time getting in touch with her back in April/may. I called her back in may to let her know she was approved and no answer. [employer] the approval and tried to call her and they never could get in touch with her so that is why they did not mail. They have to talk to the patient before mailing. Every application ends at the end of the year so she will have to re-apply at the end of the year. The daughter will call [employer] and [employer]

## 2024-03-28 NOTE — Telephone Encounter (Signed)
   Patient Name: Catherine Joseph  DOB: [birth year] MRN: [id number]  Primary Cardiologist: Maude Emmer, MD  Clinical pharmacists have reviewed the patient's past medical history, labs, and current medications as part of preoperative protocol coverage. The following recommendations have been made:  Patient with diagnosis of A Fib on Xarelto  for anticoagulation.     Procedure: EGD Date of procedure: 05/09/24     CHA2DS2-VASc Score = 5  This indicates a 7.2% annual risk of stroke. The patient's score is based upon: CHF History: 0 HTN History: 1 Diabetes History: 1 Stroke History: 0 Vascular Disease History: 0 Age Score: 2 Gender Score: 1     CrCl 50 ml/min Platelet count 234K   Patient has not had an Afib/aflutter ablation in the last 3 months, DCCV within the last 4 weeks or a watchman implanted in the last 45 days      Per office protocol, patient can hold Xarelto  for 2 days prior to procedure.      I will route this recommendation to the requesting party via Epic fax function and remove from pre-op pool.  Please call with questions.  Lamarr Satterfield, NP 03/28/2024, 4:28 PM

## 2024-03-28 NOTE — Telephone Encounter (Signed)
 Patient with diagnosis of A Fib on Xarelto  for anticoagulation.    Procedure: EGD Date of procedure: 05/09/24   CHA2DS2-VASc Score = 5  This indicates a 7.2% annual risk of stroke. The patient's score is based upon: CHF History: 0 HTN History: 1 Diabetes History: 1 Stroke History: 0 Vascular Disease History: 0 Age Score: 2 Gender Score: 1    CrCl 50 ml/min Platelet count 234K  Patient has not had an Afib/aflutter ablation in the last 3 months, DCCV within the last 4 weeks or a watchman implanted in the last 45 days    Per office protocol, patient can hold Xarelto  for 2 days prior to procedure.     **This guidance is not considered finalized until pre-operative APP has relayed final recommendations.**

## 2024-03-28 NOTE — Telephone Encounter (Signed)
 I believe this was meant to be sent to pharm-d for xarelto  recommendations. I will confirm this with the preop APP.

## 2024-03-28 NOTE — Telephone Encounter (Signed)
 Pharmacy please advise on holding Xarelto  for 2 days  prior to EDG scheduled for 05/09/2024. Last labs on 02/08/2024. Thank you.

## 2024-04-02 ENCOUNTER — Encounter: Payer: Self-pay | Admitting: *Deleted

## 2024-04-02 NOTE — Telephone Encounter (Signed)
 Pt has been scheduled for 05/09/24. Instructions mailed.

## 2024-05-04 ENCOUNTER — Encounter (HOSPITAL_COMMUNITY): Payer: Self-pay

## 2024-05-04 ENCOUNTER — Other Ambulatory Visit: Payer: Self-pay

## 2024-05-04 ENCOUNTER — Encounter (HOSPITAL_COMMUNITY)
Admission: RE | Admit: 2024-05-04 | Discharge: 2024-05-04 | Disposition: A | Discharge status: Home / Self Care | Payer: Medicare (Managed Care) | Source: Ambulatory Visit | Attending: Internal Medicine | Admitting: Internal Medicine

## 2024-05-04 NOTE — Progress Notes (Signed)
 Spoke w/patient and her daughter Roselind via phone for pre-op interview Lab needs dos----  none       Lab results------see chart COVID test -----patient states asymptomatic no test needed Arrive at -------0600 NPO after MN NO Solid Food.   Med rec completed Medications to take morning of surgery Pantoprazole  Diabetic medication none  GLP1 agonist last dose:n/a GLP1 instructions:n/a  Patient instructed no nail polish to be worn day of surgery Patient instructed to bring photo id and insurance card day of surgery Patient aware to have Driver (ride ) / caregiver    for 24 hours after surgery -  Patient Special Instructions stop xarelto  05/06/2024 Pre-Op special Instructions -----  Patient verbalized understanding of instructions that were given at this phone interview. Patient denies chest pain, sob, fever, cough at the interview.

## 2024-05-08 NOTE — Anesthesia Preprocedure Evaluation (Signed)
 Anesthesia Evaluation  Patient identified by MRN, date of birth, ID band Patient awake    Reviewed: Allergy & Precautions, H&P , NPO status , Patient's Chart, lab work & pertinent test results, reviewed documented beta blocker date and time   Airway Mallampati: II  TM Distance: >3 FB Neck ROM: full    Dental no notable dental hx. (+) Dental Advisory Given, Teeth Intact   Pulmonary neg pulmonary ROS   Pulmonary exam normal breath sounds clear to auscultation       Cardiovascular hypertension, Normal cardiovascular exam+ dysrhythmias Atrial Fibrillation  Rhythm:regular Rate:Normal  bradycardia   Neuro/Psych Lumbar stenosis negative neurological ROS  negative psych ROS   GI/Hepatic Neg liver ROS, PUD,GERD  ,,  Endo/Other  diabetes, Type 2    Renal/GU CRFRenal diseaseStage 3b CKD  negative genitourinary   Musculoskeletal  (+) Arthritis , Osteoarthritis,    Abdominal   Peds  Hematology  (+) Blood dyscrasia, anemia   Anesthesia Other Findings   Reproductive/Obstetrics negative OB ROS                              Anesthesia Physical Anesthesia Plan  ASA: 3  Anesthesia Plan: General   Post-op Pain Management: Minimal or no pain anticipated   Induction:   PONV Risk Score and Plan: Propofol  infusion  Airway Management Planned: Nasal Cannula and Natural Airway  Additional Equipment: None  Intra-op Plan:   Post-operative Plan:   Informed Consent: I have reviewed the patients History and Physical, chart, labs and discussed the procedure including the risks, benefits and alternatives for the proposed anesthesia with the patient or authorized representative who has indicated his/her understanding and acceptance.     Dental Advisory Given  Plan Discussed with: CRNA  Anesthesia Plan Comments:         Anesthesia Quick Evaluation

## 2024-05-09 ENCOUNTER — Ambulatory Visit (HOSPITAL_COMMUNITY)
Admission: RE | Admit: 2024-05-09 | Discharge: 2024-05-09 | Disposition: A | Discharge status: Home / Self Care | Payer: Medicare (Managed Care) | Attending: Internal Medicine | Admitting: Internal Medicine

## 2024-05-09 ENCOUNTER — Encounter (HOSPITAL_COMMUNITY): Payer: Self-pay | Admitting: Internal Medicine

## 2024-05-09 ENCOUNTER — Encounter (HOSPITAL_COMMUNITY)
Admission: RE | Disposition: A | Discharge status: Home / Self Care | Payer: Self-pay | Source: Home / Self Care | Attending: Internal Medicine

## 2024-05-09 ENCOUNTER — Ambulatory Visit (HOSPITAL_COMMUNITY): Payer: Self-pay | Admitting: Anesthesiology

## 2024-05-09 ENCOUNTER — Encounter (HOSPITAL_COMMUNITY): Payer: Self-pay | Admitting: Anesthesiology

## 2024-05-09 DIAGNOSIS — K3189 Other diseases of stomach and duodenum: Secondary | ICD-10-CM | POA: Insufficient documentation

## 2024-05-09 DIAGNOSIS — Z7984 Long term (current) use of oral hypoglycemic drugs: Secondary | ICD-10-CM | POA: Insufficient documentation

## 2024-05-09 DIAGNOSIS — Z8711 Personal history of peptic ulcer disease: Secondary | ICD-10-CM | POA: Diagnosis not present

## 2024-05-09 DIAGNOSIS — K259 Gastric ulcer, unspecified as acute or chronic, without hemorrhage or perforation: Secondary | ICD-10-CM | POA: Diagnosis not present

## 2024-05-09 DIAGNOSIS — Z5982 Transportation insecurity: Secondary | ICD-10-CM | POA: Insufficient documentation

## 2024-05-09 DIAGNOSIS — I129 Hypertensive chronic kidney disease with stage 1 through stage 4 chronic kidney disease, or unspecified chronic kidney disease: Secondary | ICD-10-CM | POA: Diagnosis not present

## 2024-05-09 DIAGNOSIS — Z8249 Family history of ischemic heart disease and other diseases of the circulatory system: Secondary | ICD-10-CM | POA: Diagnosis not present

## 2024-05-09 DIAGNOSIS — M48061 Spinal stenosis, lumbar region without neurogenic claudication: Secondary | ICD-10-CM | POA: Diagnosis not present

## 2024-05-09 DIAGNOSIS — Z7901 Long term (current) use of anticoagulants: Secondary | ICD-10-CM | POA: Diagnosis not present

## 2024-05-09 DIAGNOSIS — K2211 Ulcer of esophagus with bleeding: Secondary | ICD-10-CM | POA: Diagnosis not present

## 2024-05-09 DIAGNOSIS — N1832 Chronic kidney disease, stage 3b: Secondary | ICD-10-CM | POA: Diagnosis not present

## 2024-05-09 DIAGNOSIS — I4891 Unspecified atrial fibrillation: Secondary | ICD-10-CM | POA: Insufficient documentation

## 2024-05-09 DIAGNOSIS — Z833 Family history of diabetes mellitus: Secondary | ICD-10-CM | POA: Insufficient documentation

## 2024-05-09 DIAGNOSIS — E1122 Type 2 diabetes mellitus with diabetic chronic kidney disease: Secondary | ICD-10-CM | POA: Diagnosis not present

## 2024-05-09 DIAGNOSIS — M1990 Unspecified osteoarthritis, unspecified site: Secondary | ICD-10-CM | POA: Diagnosis not present

## 2024-05-09 DIAGNOSIS — K219 Gastro-esophageal reflux disease without esophagitis: Secondary | ICD-10-CM | POA: Diagnosis not present

## 2024-05-09 DIAGNOSIS — Z79899 Other long term (current) drug therapy: Secondary | ICD-10-CM | POA: Insufficient documentation

## 2024-05-09 DIAGNOSIS — Z09 Encounter for follow-up examination after completed treatment for conditions other than malignant neoplasm: Secondary | ICD-10-CM | POA: Diagnosis not present

## 2024-05-09 DIAGNOSIS — E1169 Type 2 diabetes mellitus with other specified complication: Secondary | ICD-10-CM

## 2024-05-09 HISTORY — PX: ESOPHAGOGASTRODUODENOSCOPY: SHX5428

## 2024-05-09 LAB — GLUCOSE, CAPILLARY: Glucose-Capillary: 129 mg/dL — ABNORMAL HIGH (ref 70–99)

## 2024-05-09 SURGERY — EGD (ESOPHAGOGASTRODUODENOSCOPY)
Anesthesia: General

## 2024-05-09 MED ORDER — LIDOCAINE HCL 1 % IJ SOLN
INTRAMUSCULAR | Status: DC | PRN
  Administered 2024-05-09: 50 mg via INTRADERMAL

## 2024-05-09 MED ORDER — LACTATED RINGERS IV SOLN: INTRAVENOUS | Status: DC

## 2024-05-09 MED ORDER — PROPOFOL 10 MG/ML IV BOLUS
INTRAVENOUS | Status: DC | PRN
  Administered 2024-05-09 (×4): 20 mg via INTRAVENOUS
  Administered 2024-05-09: 80 mg via INTRAVENOUS

## 2024-05-09 NOTE — Discharge Instructions (Addendum)
 EGD Discharge instructions Please read the instructions outlined below and refer to this sheet in the next few weeks. These discharge instructions provide you with general information on caring for yourself after you leave the hospital. Your doctor may also give you specific instructions. While your treatment has been planned according to the most current medical practices available, unavoidable complications occasionally occur. If you have any problems or questions after discharge, please call your doctor. ACTIVITY You may resume your regular activity but move at a slower pace for the next 24 hours.  Take frequent rest periods for the next 24 hours.  Walking will help expel (get rid of) the air and reduce the bloated feeling in your abdomen.  No driving for 24 hours (because of the anesthesia (medicine) used during the test).  You may shower.  Do not sign any important legal documents or operate any machinery for 24 hours (because of the anesthesia used during the test).  NUTRITION Drink plenty of fluids.  You may resume your normal diet.  Begin with a light meal and progress to your normal diet.  Avoid alcoholic beverages for 24 hours or as instructed by your caregiver.  MEDICATIONS You may resume your normal medications unless your caregiver tells you otherwise.  WHAT YOU CAN EXPECT TODAY You may experience abdominal discomfort such as a feeling of fullness or "gas" pains.  FOLLOW-UP Your doctor will discuss the results of your test with you.  SEEK IMMEDIATE MEDICAL ATTENTION IF ANY OF THE FOLLOWING OCCUR: Excessive nausea (feeling sick to your stomach) and/or vomiting.  Severe abdominal pain and distention (swelling).  Trouble swallowing.  Temperature over 101 F (37.8 C).  Rectal bleeding or vomiting of blood.     You still have quite a bit of irritation and erosions in your stomach.  Additional biopsies taken.  Please continue to avoid all forms of NSAIDs like Aleve and Advil.  81  mg aspirin daily is okay  Continue taking Protonix  40 mg twice daily  Resume Xarelto  05/11/24  Further recommendations to follow pending review of pathology report  At patient request I called Roselind Bollard, daughter, at [phone] findings and recommendations

## 2024-05-09 NOTE — Op Note (Signed)
 [facility] Patient Name: Catherine Joseph Procedure Date: 05/09/2024 7:19 AM MRN: [id number] Date of Birth: [birth year] Attending MD: Lamar Ozell Hollingshead , MD, [id number] CSN: [id number] Age: 83 Admit Type: Outpatient Procedure:                Upper GI endoscopy Indications:              Surveillance procedure, Bleeding esophageal ulcer Providers:                Lamar Ozell Hollingshead, MD, Jon LABOR. Gerome RN, RN,                            Devere Lodge Referring MD:              Medicines:                Propofol  per Anesthesia Complications:            No immediate complications. Estimated Blood Loss:     Estimated blood loss was minimal. Procedure:                Pre-Anesthesia Assessment:                           - Prior to the procedure, a History and Physical                            was performed, and patient medications and                            allergies were reviewed. The patient's tolerance of                            previous anesthesia was also reviewed. The risks                            and benefits of the procedure and the sedation                            options and risks were discussed with the patient.                            All questions were answered, and informed consent                            was obtained. Prior Anticoagulants: The patient                            last took Xarelto  (rivaroxaban ) 3 days prior to the                            procedure. ASA Grade Assessment: III - A patient                            with severe systemic disease. After reviewing the  risks and benefits, the patient was deemed in                            satisfactory condition to undergo the procedure.                           After obtaining informed consent, the endoscope was                            passed under direct vision. Throughout the                            procedure, the patient's blood pressure, pulse,  and                            oxygen saturations were monitored continuously. The                            [id number] ([id number]) Upper was introduced through                            the mouth, and advanced to the second part of                            duodenum. The upper GI endoscopy was accomplished                            without difficulty. The patient tolerated the                            procedure well. Scope In: 7:39:40 AM Scope Out: 7:53:09 AM Total Procedure Duration: 0 hours 13 minutes 29 seconds  Findings:      The examined esophagus was normal.      Multiple linear erosions in the gastric fundus. 2 cm area of linear       erosion with adherent clot but no active bleeding in the same area. Area       of ulcer looks to be well-healed with 3 clips remaining. The cardia was       somewhat polypoid in appearance but no obvious infiltrating process.       Pylorus patent.      The duodenal bulb and second portion of the duodenum were normal. Impression:               - Normal esophagus.                           - Erosive gastropathy with stigmata of recent                            bleeding. Status post gastric biopsies; area of                            most extensive erosion with overlying clot biopsied  separately                           - Normal duodenal bulb and second portion of the                            duodenum.                           - Discussed at length with daughter. She denies                            patient taking any NSAIDs other than a baby aspirin                            daily she has been on Protonix  twice daily. Moderate Sedation:      Moderate (conscious) sedation was personally administered by an       anesthesia professional. The following parameters were monitored: oxygen       saturation, heart rate, blood pressure, respiratory rate, EKG, adequacy       of pulmonary ventilation, and response to  care. Recommendation:           - Patient has a contact number available for                            emergencies. The signs and symptoms of potential                            delayed complications were discussed with the                            patient. Return to normal activities tomorrow.                            Written discharge instructions were provided to the                            patient.                           - Advance diet as tolerated. Continue twice daily                            PPI. No NSAIDs. Follow-up on pathology. Office                            visit with us  in 4 weeks. Procedure Code(s):        --- Professional ---                           [phone], Esophagogastroduodenoscopy, flexible,                            transoral; diagnostic, including collection of  specimen(s) by brushing or washing, when performed                            (separate procedure) Diagnosis Code(s):        --- Professional ---                           K92.2, Gastrointestinal hemorrhage, unspecified                           K22.11, Ulcer of esophagus with bleeding CPT copyright 2022 American Medical Association. All rights reserved. The codes documented in this report are preliminary and upon coder review may  be revised to meet current compliance requirements. Lamar HERO. Fredie Majano, MD Lamar Ozell Hollingshead, MD 05/09/2024 8:12:55 AM This report has been signed electronically. Number of Addenda: 0

## 2024-05-09 NOTE — Anesthesia Postprocedure Evaluation (Signed)
 Anesthesia Post Note  Patient: Catherine Joseph  Procedure(s) Performed: EGD (ESOPHAGOGASTRODUODENOSCOPY)  Patient location during evaluation: Phase II Anesthesia Type: General Level of consciousness: awake and alert Pain management: pain level controlled Vital Signs Assessment: post-procedure vital signs reviewed and stable Respiratory status: spontaneous breathing, nonlabored ventilation and respiratory function stable Cardiovascular status: stable Anesthetic complications: no   There were no known notable events for this encounter.   Last Vitals:  Vitals:   05/09/24 0801 05/09/24 0810  BP: (!) 111/45 (!) 115/53  Pulse: (!) 108   Resp: 15   Temp: 36.6 C   SpO2: 98%     Last Pain:  Vitals:   05/09/24 0801  TempSrc: Oral  PainSc: 0-No pain                 Noelia Lenart L Jamya Starry
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
## 2024-05-09 NOTE — Transfer of Care (Signed)
 Immediate Anesthesia Transfer of Care Note  Patient: Catherine Joseph  Procedure(s) Performed: EGD (ESOPHAGOGASTRODUODENOSCOPY)  Patient Location: Short Stay  Anesthesia Type:General  Level of Consciousness: awake  Airway & Oxygen Therapy: Patient Spontanous Breathing  Post-op Assessment: Report given to RN  Post vital signs: Reviewed and stable  Last Vitals:  Vitals Value Taken Time  BP    Temp    Pulse    Resp    SpO2      Last Pain:  Vitals:   05/09/24 0740  TempSrc:   PainSc: 0-No pain      Patients Stated Pain Goal: 6 (05/09/24 0651)  Complications: No notable events documented.

## 2024-05-09 NOTE — Anesthesia Postprocedure Evaluation (Signed)
 Anesthesia Post Note  Patient: Catherine Joseph  Procedure(s) Performed: EGD (ESOPHAGOGASTRODUODENOSCOPY)  Patient location during evaluation: Short Stay Anesthesia Type: General Level of consciousness: awake and alert Pain management: pain level controlled Vital Signs Assessment: post-procedure vital signs reviewed and stable Respiratory status: spontaneous breathing Cardiovascular status: stable Postop Assessment: no apparent nausea or vomiting Anesthetic complications: no   There were no known notable events for this encounter.   Last Vitals:  Vitals:   05/09/24 0801 05/09/24 0810  BP: (!) 111/45 (!) 115/53  Pulse: (!) 108   Resp: 15   Temp: 36.6 C   SpO2: 98%     Last Pain:  Vitals:   05/09/24 0801  TempSrc: Oral  PainSc: 0-No pain                 Iram Lundberg

## 2024-05-10 LAB — SURGICAL PATHOLOGY

## 2024-05-11 ENCOUNTER — Encounter (HOSPITAL_COMMUNITY): Payer: Self-pay | Admitting: Internal Medicine

## 2024-05-11 ENCOUNTER — Ambulatory Visit: Payer: Self-pay | Admitting: Internal Medicine

## 2024-05-15 ENCOUNTER — Ambulatory Visit: Payer: Self-pay

## 2024-05-15 NOTE — Telephone Encounter (Signed)
 FYI Only or Action Required?: Action required by provider: request for appointment.  Patient was last seen in primary care on 02/08/2024 by Tobie Suzzane POUR, MD.  Called Nurse Triage reporting Dizziness.  Symptoms began several weeks ago.  Interventions attempted: Nothing.  Symptoms are: stable.  Triage Disposition: See PCP Within 2 Weeks  Patient/caregiver understands and will follow disposition?: Yes Reason for Disposition  [1] MILD dizziness (e.g., walking normally) AND [2] has been evaluated by doctor (or NP/PA) for this  Answer Assessment - Initial Assessment Questions States BP has been 156/64. Patient is taking BP medication every day. Patient is denying appointments with any other office and other provider, wants to see Dr. Tobie. Advised patient Dr. Tobie has no available appointments until January and she has an appointment with him Dec 18. Patient states she wants to see him before then. Please advise.   1. LIGHTHEADED: Do you feel lightheaded? (e.g., somewhat faint, woozy, weak upon standing)     Faint  2. VERTIGO: Do you feel like either you or the room is spinning or tilting? (i.e., vertigo)     Denies  3. SEVERITY: How bad is it?  Do you feel like you are going to faint? Can you stand and walk?     Getting worse, patient states she can walk.  4. ONSET:  When did the dizziness begin?     Last week  5. AGGRAVATING FACTORS: Does anything make it worse? (e.g., standing, change in head position)     Denies  6. CAUSE: What do you think is causing the dizziness? (e.g., decreased fluids or food, diarrhea, emotional distress, heat exposure, new medicine, sudden standing, vomiting; unknown)     Unsure  7. RECURRENT SYMPTOM: Have you had dizziness before? If Yes, ask: When was the last time? What happened that time?     Long time ago  8. OTHER SYMPTOMS: Do you have any other symptoms? (e.g., fever, chest pain, vomiting, diarrhea, bleeding)        Very cold and very fatigued  Protocols used: Dizziness - Lightheadedness-A-AH  Copied from CRM #[id number]. Topic: Clinical - Red Word Triage >> May 15, 2024  3:27 PM Shanda MATSU wrote: Red Word that prompted transfer to Nurse Triage: Patient is reporting extreme dizziness.

## 2024-05-15 NOTE — Telephone Encounter (Signed)
 Per Tobie okay to provider video appt at 9:40 or 11:40 on 11.26.2025

## 2024-05-16 ENCOUNTER — Telehealth (INDEPENDENT_AMBULATORY_CARE_PROVIDER_SITE_OTHER): Payer: Medicare (Managed Care) | Admitting: Internal Medicine

## 2024-05-16 ENCOUNTER — Encounter: Payer: Self-pay | Admitting: Internal Medicine

## 2024-05-16 DIAGNOSIS — I10 Essential (primary) hypertension: Secondary | ICD-10-CM

## 2024-05-16 DIAGNOSIS — R42 Dizziness and giddiness: Secondary | ICD-10-CM | POA: Diagnosis not present

## 2024-05-16 NOTE — Assessment & Plan Note (Addendum)
 Episodes of dizziness/room spinning sensation likely due to vertigo Meclizine  as needed for dizziness, she has adequate supply currently Avoid sudden positional changes Advised to maintain at least 64 ounces of fluid intake in a day and eat at regular intervals Simple vestibular exercises advised, video material link provided

## 2024-05-16 NOTE — Progress Notes (Signed)
 Virtual Visit via Video Note   Because of Catherine Joseph's co-morbid illnesses, she is at least at moderate risk for complications without adequate follow up.  This format is felt to be most appropriate for this patient at this time.  All issues noted in this document were discussed and addressed.  A limited physical exam was performed with this format.      Evaluation Performed:  Follow-up visit  Date:  05/16/2024   ID:  Catherine Joseph, DOB 06-11-1941, MRN [id number]  Patient Location: Home Provider Location: Office/Clinic  Participants: Patient Location of Patient: Home Location of Provider: Telehealth Consent was obtain for visit to be over via telehealth. I verified that I am speaking with the correct person using two identifiers.  PCP:  Catherine Suzzane POUR, MD   Chief Complaint: Dizziness  History of Present Illness:    Catherine Joseph is a 83 y.o. female who has a video visit for complaint of dizziness for the last 2 weeks.  She reports episodes of dizziness, lasting for few seconds, and feels room spinning sensation during the episodes.  She has noticed dizziness in sitting or standing position.  Has mild nausea, but denies any episode of vomiting recently.  Denies any recent episode of URTI.  She has history of vertigo, has tried taking meclizine  once or twice recently with adequate relief.  The patient does not have symptoms concerning for COVID-19 infection (fever, chills, cough, or new shortness of breath).   Past Medical, Surgical, Social History, Allergies, and Medications have been Reviewed.  Past Medical History:  Diagnosis Date   Anemia    Arthritis    Bradycardia    Cataract    Chronic kidney disease    Diabetes mellitus type II    Hypertension    Osteoporosis    Syncope    Weakness    Past Surgical History:  Procedure Laterality Date   ABDOMINAL HYSTERECTOMY     CESAREAN SECTION     COLON SURGERY     ESOPHAGOGASTRODUODENOSCOPY N/A  10/14/2023   Procedure: EGD (ESOPHAGOGASTRODUODENOSCOPY);  Surgeon: Shaaron Lamar HERO, MD;  [facility];  Service: Endoscopy;  Laterality: N/A;   ESOPHAGOGASTRODUODENOSCOPY N/A 05/09/2024   Procedure: EGD (ESOPHAGOGASTRODUODENOSCOPY);  Surgeon: Shaaron Lamar HERO, MD;  Location: [facility];  Service: Endoscopy;  Laterality: N/A;   EYE SURGERY     FRACTURE SURGERY       Current Meds  Medication Sig   ACCU-CHEK AVIVA PLUS test strip TEST BLOOD SUGAR ONE TIME DAILY   Accu-Chek Softclix Lancets lancets TEST BLOOD SUGAR ONE TIME DAILY   acetaminophen  (TYLENOL ) 500 MG tablet Take 1 tablet (500 mg total) by mouth every 6 (six) hours as needed.   Alcohol  Swabs (B-D SINGLE USE SWABS REGULAR) PADS USE TO CHECK BLOOD SUGAR DAILY   Blood Glucose Calibration (SURECHEK CONTROL SOLUTION) Normal LIQD Use to check blood sugar daily. Dx E11.65   Blood Glucose Monitoring Suppl (ACCU-CHEK AVIVA PLUS) w/Device KIT USE AS DIRECTED   flecainide  (TAMBOCOR ) 100 MG tablet TAKE 1/2 TABLET(50 MG) BY MOUTH TWICE DAILY   glipiZIDE  (GLUCOTROL ) 10 MG tablet TAKE 1 TABLET TWICE DAILY BEFORE MEALS   hydrALAZINE  (APRESOLINE ) 50 MG tablet Take 1 tablet (50 mg total) by mouth 3 (three) times daily.   irbesartan  (AVAPRO ) 300 MG tablet Take 1 tablet (300 mg total) by mouth daily.   meclizine  (ANTIVERT ) 25 MG tablet TAKE 1 TABLET TWICE DAILY AS NEEDED FOR DIZZINESS (Patient taking  differently: 3 (three) times daily as needed.)   nystatin  cream (MYCOSTATIN ) APPLY 1 APPLICATION TOPICALLY 2 (TWO) TIMES DAILY.   pantoprazole  (PROTONIX ) 40 MG tablet Take 1 tablet (40 mg total) by mouth 2 (two) times daily.   rivaroxaban  (XARELTO ) 20 MG TABS tablet Take 1 tablet (20 mg total) by mouth daily with supper.   simvastatin  (ZOCOR ) 40 MG tablet Take 1 tablet (40 mg total) by mouth daily at 6 PM.   traMADol  (ULTRAM ) 50 MG tablet Take 1 tablet (50 mg total) by mouth every 12 (twelve) hours as needed for severe pain (pain score 7-10).      Allergies:   Codeine   ROS:   Please see the history of present illness. All other systems reviewed and are negative.   Labs/Other Tests and Data Reviewed:    Recent Labs: 07/12/2023: TSH 1.030 02/08/2024: ALT 11; BUN 15; Creatinine, Ser 1.12; Hemoglobin 10.9; Platelets 234; Potassium 4.0; Sodium 135   Recent Lipid Panel Lab Results  Component Value Date/Time   CHOL 188 07/12/2023 02:13 PM   TRIG 77 07/12/2023 02:13 PM   HDL 61 07/12/2023 02:13 PM   CHOLHDL 3.1 07/12/2023 02:13 PM   CHOLHDL 2.6 11/29/2015 09:13 AM   LDLCALC 113 (H) 07/12/2023 02:13 PM   LDLDIRECT 92 08/08/2017 04:43 PM   LDLDIRECT 119 (H) 02/20/2013 05:14 PM    Wt Readings from Last 3 Encounters:  05/09/24 186 lb (84.4 kg)  03/27/24 190 lb 9.6 oz (86.5 kg)  02/08/24 190 lb 12.8 oz (86.5 kg)     Objective:    Vital Signs:  There were no vitals taken for this visit.   VITAL SIGNS:  reviewed GEN:  no acute distress EYES:  sclerae anicteric, EOMI - Extraocular Movements Intact RESPIRATORY:  normal respiratory effort, symmetric expansion NEURO:  alert and oriented x 3, no obvious focal deficit PSYCH:  normal affect  ASSESSMENT & PLAN:    Assessment & Plan Vertigo Episodes of dizziness/room spinning sensation likely due to vertigo Meclizine  as needed for dizziness, she has adequate supply currently Avoid sudden positional changes Advised to maintain at least 64 ounces of fluid intake in a day and eat at regular intervals Simple vestibular exercises advised, video material link provided Essential hypertension BP Readings from Last 1 Encounters:  05/09/24 (!) 115/53   Reports elevated BP recently at home, advised to continue irbesartan  and hydralazine  regularly, contact if BP more than 160/90 Usually well-controlled with irbesartan  300 mg QD and hydralazine  50 mg TID Counseled for compliance with the medications Advised DASH diet and moderate exercise/walking as tolerated    I discussed the  assessment and treatment plan with the patient. The patient was provided an opportunity to ask questions, and all were answered. The patient agreed with the plan and demonstrated an understanding of the instructions.   The patient was advised to call back or seek an in-person evaluation if the symptoms worsen or if the condition fails to improve as anticipated.  The above assessment and management plan was discussed with the patient. The patient verbalized understanding of and has agreed to the management plan.   Medication Adjustments/Labs and Tests Ordered: Current medicines are reviewed at length with the patient today.  Concerns regarding medicines are outlined above.   Tests Ordered: No orders of the defined types were placed in this encounter.   Medication Changes: No orders of the defined types were placed in this encounter.    Note: This dictation was prepared with Dragon dictation along  with smaller phrase technology. Similar sounding words can be transcribed inadequately or may not be corrected upon review. Any transcriptional errors that result from this process are unintentional.      Disposition:  Follow up  Signed, Suzzane MARLA Blanch, MD  05/16/2024 11:53 AM     [facility]

## 2024-05-16 NOTE — Assessment & Plan Note (Addendum)
 BP Readings from Last 1 Encounters:  05/09/24 (!) 115/53   Reports elevated BP recently at home, advised to continue irbesartan  and hydralazine  regularly, contact if BP more than 160/90 Usually well-controlled with irbesartan  300 mg QD and hydralazine  50 mg TID Counseled for compliance with the medications Advised DASH diet and moderate exercise/walking as tolerated

## 2024-05-16 NOTE — Patient Instructions (Signed)
 Please take meclizine  twice daily as needed for dizziness.  Please avoid sudden positional changes.  Please maintain at least 64 ounces of fluid intake in a day and eat at regular intervals.  Please perform simple vestibular exercises as shown in the video: link below.  [email or web address].[email or web address]

## 2024-05-20 ENCOUNTER — Other Ambulatory Visit: Payer: Self-pay | Admitting: Internal Medicine

## 2024-05-20 DIAGNOSIS — I10 Essential (primary) hypertension: Secondary | ICD-10-CM

## 2024-06-01 NOTE — Progress Notes (Signed)
° °  06/01/2024  Patient ID: Arlyne JINNY Pac, female   DOB: 1941/03/06, 83 y.o.   MRN: [id number]  Pharmacy Quality Measure Review  This patient is appearing on a report for being at risk of failing the adherence measure for cholesterol (statin), diabetes, and hypertension (ACEi/ARB) medications this calendar year.   Medication: irbesarta, glipizide , simvastatin   Last fill date: 11/20 each for 90 day supply  Insurance report was not up to date. No action needed at this time.   Lang Sieve, PharmD, BCGP Clinical Pharmacist  [phone]

## 2024-06-07 ENCOUNTER — Ambulatory Visit (INDEPENDENT_AMBULATORY_CARE_PROVIDER_SITE_OTHER): Payer: Medicare (Managed Care) | Admitting: Internal Medicine

## 2024-06-07 VITALS — BP 112/54 | HR 107 | Resp 18 | Ht 63.0 in | Wt 194.2 lb

## 2024-06-07 DIAGNOSIS — I480 Paroxysmal atrial fibrillation: Secondary | ICD-10-CM

## 2024-06-07 DIAGNOSIS — Z0001 Encounter for general adult medical examination with abnormal findings: Secondary | ICD-10-CM

## 2024-06-07 DIAGNOSIS — N1832 Chronic kidney disease, stage 3b: Secondary | ICD-10-CM

## 2024-06-07 DIAGNOSIS — Z23 Encounter for immunization: Secondary | ICD-10-CM | POA: Diagnosis not present

## 2024-06-07 DIAGNOSIS — Z7984 Long term (current) use of oral hypoglycemic drugs: Secondary | ICD-10-CM

## 2024-06-07 DIAGNOSIS — E1169 Type 2 diabetes mellitus with other specified complication: Secondary | ICD-10-CM

## 2024-06-07 DIAGNOSIS — I10 Essential (primary) hypertension: Secondary | ICD-10-CM | POA: Diagnosis not present

## 2024-06-07 DIAGNOSIS — K219 Gastro-esophageal reflux disease without esophagitis: Secondary | ICD-10-CM

## 2024-06-07 DIAGNOSIS — R42 Dizziness and giddiness: Secondary | ICD-10-CM

## 2024-06-07 MED ORDER — HYDRALAZINE HCL 50 MG PO TABS: 50.0000 mg | ORAL_TABLET | Freq: Two times a day (BID) | ORAL | Status: AC

## 2024-06-07 NOTE — Assessment & Plan Note (Addendum)
 Physical exam as documented. Blood tests today. Has had Shingrix vaccines at [facility] - date unknown.

## 2024-06-07 NOTE — Assessment & Plan Note (Addendum)
 On flecainide  and Xarelto  - advised to contact cardiology office for Xarelto  Needs to get flecainide  refilled from pharmacy Followed by cardiology

## 2024-06-07 NOTE — Assessment & Plan Note (Addendum)
 Well-controlled with pantoprazole  Also takes Pepcid  40 mg once daily

## 2024-06-07 NOTE — Progress Notes (Signed)
 Established Patient Office Visit  Subjective:  Patient ID: Catherine Joseph, female    DOB: 1940/09/26  Age: 83 y.o. MRN: [id number]  CC:  Chief Complaint  Patient presents with   Medical Management of Chronic Issues    Follow up    Dizziness    Pt complains of dizziness and weakness for last couple weeks. Denies chest pains or SOB    HPI Catherine Joseph is a 83 y.o. female with past medical history of atrial fibrillation, HTN, type II DM, HLD and obesity who presents for f/u of her chronic medical conditions.  HTN and atrial fibrillation: Her BP is low normal today, especially diastolic BP.  She reports intermittent dizziness and fatigue for the last 3 weeks.  She has had similar BP readings at home. She is currently taking irbesartan  300 mg QD and Hydralazine  50 mg 3 times daily. She is taking flecainide  and Xarelto  for A-fib, but has run out of flecainide  recently.  She usually has cost concern with Xarelto .  She is followed by Dr. Nishan for it.  She denies any headache, chest pain, dyspnea or, palpitations.  Type II DM: She is on glipizide  10 mg twice daily currently.  She preferred not to take metformin .  Her HbA1c was stable at 7.3 in 08/25. Denies any polyuria or polyphagia currently.  She is also on simvastatin  20 mg QD for HLD.  Her last BMP showed GFR at 48, which was better compared to prior.  She currently denies any dysuria, hematuria or urinary hesitancy or resistance.  Back pain: She reports chronic low back pain, intermittent, worse with bending.  She has tried Tylenol  with mild relief.  She has also tried Voltaren  gel.  Denies any numbness or tingling of the LE.  She had MRI of lumbar spine in 2023, which showed L4-5 spinal canal stenosis and neural foraminal narrowing at L4-5, L5-S1 levels. She had adequate relief with Tramadol , but has not taken it recently.   Past Medical History:  Diagnosis Date   Anemia    Arthritis    Bradycardia    Cataract    Chronic  kidney disease    Diabetes mellitus type II    Hypertension    Osteoporosis    Syncope    Weakness     Past Surgical History:  Procedure Laterality Date   ABDOMINAL HYSTERECTOMY     CESAREAN SECTION     COLON SURGERY     ESOPHAGOGASTRODUODENOSCOPY N/A 10/14/2023   Procedure: EGD (ESOPHAGOGASTRODUODENOSCOPY);  Surgeon: Shaaron Lamar HERO, MD;  [facility];  Service: Endoscopy;  Laterality: N/A;   ESOPHAGOGASTRODUODENOSCOPY N/A 05/09/2024   Procedure: EGD (ESOPHAGOGASTRODUODENOSCOPY);  Surgeon: Shaaron Lamar HERO, MD;  Location: [facility];  Service: Endoscopy;  Laterality: N/A;   EYE SURGERY     FRACTURE SURGERY      Family History  Problem Relation Age of Onset   Diabetes Sister    Hyperlipidemia Sister    Hypertension Sister    Diabetes Brother    Hyperlipidemia Brother    Hypertension Brother     Social History   Socioeconomic History   Marital status: Widowed    Spouse name: Not on file   Number of children: Not on file   Years of education: Not on file   Highest education level: Not on file  Occupational History   Not on file  Tobacco Use   Smoking status: Never   Smokeless tobacco: Never  Vaping Use  Vaping status: Never Used  Substance and Sexual Activity   Alcohol  use: No   Drug use: No   Sexual activity: Not Currently  Other Topics Concern   Not on file  Social History Narrative   ambudexterty   No caffeine   Two story home    Social Drivers of Health   Tobacco Use: Low Risk (06/07/2024)   Patient History    Smoking Tobacco Use: Never    Smokeless Tobacco Use: Never    Passive Exposure: Not on file  Financial Resource Strain: Low Risk (08/01/2023)   Overall Financial Resource Strain (CARDIA)    Difficulty of Paying Living Expenses: Not hard at all  Food Insecurity: No Food Insecurity (10/14/2023)   Hunger Vital Sign    Worried About Running Out of Food in the Last Year: Never true    Ran Out of Food in the Last Year: Never true   Recent Concern: Food Insecurity - Food Insecurity Present (08/01/2023)   Hunger Vital Sign    Worried About Running Out of Food in the Last Year: Sometimes true    Ran Out of Food in the Last Year: Sometimes true  Transportation Needs: No Transportation Needs (10/14/2023)   PRAPARE - Administrator, Civil Service (Medical): No    Lack of Transportation (Non-Medical): No  Recent Concern: Transportation Needs - Unmet Transportation Needs (08/01/2023)   PRAPARE - Transportation    Lack of Transportation (Medical): Yes    Lack of Transportation (Non-Medical): Yes  Physical Activity: Inactive (08/01/2023)   Exercise Vital Sign    Days of Exercise per Week: 0 days    Minutes of Exercise per Session: 0 min  Stress: No Stress Concern Present (08/01/2023)   [employer] of Occupational Health - Occupational Stress Questionnaire    Feeling of Stress : Not at all  Social Connections: Moderately Integrated (10/14/2023)   Social Connection and Isolation Panel    Frequency of Communication with Friends and Family: Three times a week    Frequency of Social Gatherings with Friends and Family: More than three times a week    Attends Religious Services: More than 4 times per year    Active Member of Clubs or Organizations: Yes    Attends Banker Meetings: More than 4 times per year    Marital Status: Widowed  Intimate Partner Violence: Not At Risk (10/14/2023)   Humiliation, Afraid, Rape, and Kick questionnaire    Fear of Current or Ex-Partner: No    Emotionally Abused: No    Physically Abused: No    Sexually Abused: No  Depression (PHQ2-9): Low Risk (06/07/2024)   Depression (PHQ2-9)    PHQ-2 Score: 0  Alcohol  Screen: Low Risk (08/01/2023)   Alcohol  Screen    Last Alcohol  Screening Score (AUDIT): 0  Housing: Low Risk (10/14/2023)   Housing Stability Vital Sign    Unable to Pay for Housing in the Last Year: No    Number of Times Moved in the Last Year: 0    Homeless in  the Last Year: No  Utilities: Not At Risk (10/14/2023)   AHC Utilities    Threatened with loss of utilities: No  Health Literacy: Adequate Health Literacy (08/01/2023)   B1300 Health Literacy    Frequency of need for help with medical instructions: Never    Outpatient Medications Prior to Visit  Medication Sig Dispense Refill   ACCU-CHEK AVIVA PLUS test strip TEST BLOOD SUGAR ONE TIME DAILY 100 strip 1  Accu-Chek Softclix Lancets lancets TEST BLOOD SUGAR ONE TIME DAILY 100 each 3   acetaminophen  (TYLENOL ) 500 MG tablet Take 1 tablet (500 mg total) by mouth every 6 (six) hours as needed. 30 tablet 0   Alcohol  Swabs (B-D SINGLE USE SWABS REGULAR) PADS USE TO CHECK BLOOD SUGAR DAILY 100 each 3   Blood Glucose Calibration (SURECHEK CONTROL SOLUTION) Normal LIQD Use to check blood sugar daily. Dx E11.65 1 each 3   Blood Glucose Monitoring Suppl (ACCU-CHEK AVIVA PLUS) w/Device KIT USE AS DIRECTED 1 kit 5   flecainide  (TAMBOCOR ) 100 MG tablet TAKE 1/2 TABLET(50 MG) BY MOUTH TWICE DAILY 90 tablet 1   glipiZIDE  (GLUCOTROL ) 10 MG tablet TAKE 1 TABLET TWICE DAILY BEFORE MEALS 180 tablet 3   irbesartan  (AVAPRO ) 300 MG tablet Take 1 tablet (300 mg total) by mouth daily. 90 tablet 3   meclizine  (ANTIVERT ) 25 MG tablet TAKE 1 TABLET TWICE DAILY AS NEEDED FOR DIZZINESS (Patient taking differently: 3 (three) times daily as needed.) 60 tablet 3   nystatin  cream (MYCOSTATIN ) APPLY 1 APPLICATION TOPICALLY 2 (TWO) TIMES DAILY. 30 g 11   pantoprazole  (PROTONIX ) 40 MG tablet Take 1 tablet (40 mg total) by mouth 2 (two) times daily. 60 tablet 5   rivaroxaban  (XARELTO ) 20 MG TABS tablet Take 1 tablet (20 mg total) by mouth daily with supper. 90 tablet 1   simvastatin  (ZOCOR ) 40 MG tablet Take 1 tablet (40 mg total) by mouth daily at 6 PM. 90 tablet 3   traMADol  (ULTRAM ) 50 MG tablet Take 1 tablet (50 mg total) by mouth every 12 (twelve) hours as needed for severe pain (pain score 7-10). 30 tablet 0   hydrALAZINE   (APRESOLINE ) 50 MG tablet TAKE 1 TABLET(50 MG) BY MOUTH THREE TIMES DAILY 270 tablet 1   No facility-administered medications prior to visit.    Allergies  Allergen Reactions   Codeine Rash and Other (See Comments)    Vertigo     ROS Review of Systems  Constitutional:  Negative for chills and fever.  HENT:  Negative for congestion, sinus pressure, sinus pain and sore throat.   Eyes:  Negative for pain and discharge.  Respiratory:  Negative for cough and shortness of breath.   Cardiovascular:  Negative for chest pain and palpitations.  Gastrointestinal:  Negative for diarrhea, nausea and vomiting.  Endocrine: Negative for polydipsia and polyuria.  Genitourinary:  Negative for dysuria and hematuria.  Musculoskeletal:  Positive for back pain. Negative for neck pain and neck stiffness.  Skin:  Negative for rash.  Neurological:  Positive for dizziness. Negative for weakness.  Psychiatric/Behavioral:  Negative for agitation and behavioral problems.       Objective:    Physical Exam Vitals reviewed.  Constitutional:      General: She is not in acute distress.    Appearance: She is obese. She is not diaphoretic.  HENT:     Head: Normocephalic and atraumatic.     Nose: Nose normal.     Mouth/Throat:     Mouth: Mucous membranes are moist.  Eyes:     General: No scleral icterus.    Extraocular Movements: Extraocular movements intact.  Cardiovascular:     Rate and Rhythm: Normal rate. Rhythm irregular.     Heart sounds: Normal heart sounds. No murmur heard. Pulmonary:     Breath sounds: Normal breath sounds. No wheezing or rales.  Musculoskeletal:     Cervical back: Neck supple. No tenderness.     Lumbar back:  Tenderness present. Decreased range of motion.     Right lower leg: No edema.     Left lower leg: No edema.  Skin:    General: Skin is warm.     Findings: No rash.  Neurological:     General: No focal deficit present.     Mental Status: She is alert and oriented to  person, place, and time.     Sensory: No sensory deficit.     Motor: No weakness.  Psychiatric:        Mood and Affect: Mood normal.        Behavior: Behavior normal.     BP (!) 112/54 (BP Location: Left Arm)   Pulse (!) 107   Resp 18   Ht 5' 3 (1.6 m)   Wt 194 lb 3.2 oz (88.1 kg)   SpO2 98%   BMI 34.40 kg/m  Wt Readings from Last 3 Encounters:  06/07/24 194 lb 3.2 oz (88.1 kg)  05/09/24 186 lb (84.4 kg)  03/27/24 190 lb 9.6 oz (86.5 kg)    Lab Results  Component Value Date   TSH 1.030 07/12/2023   Lab Results  Component Value Date   WBC 4.5 02/08/2024   HGB 10.9 (L) 02/08/2024   HCT 37.1 02/08/2024   MCV 86 02/08/2024   PLT 234 02/08/2024   Lab Results  Component Value Date   NA 135 02/08/2024   K 4.0 02/08/2024   CO2 24 02/08/2024   GLUCOSE 243 (H) 02/08/2024   BUN 15 02/08/2024   CREATININE 1.12 (H) 02/08/2024   BILITOT 0.7 02/08/2024   ALKPHOS 119 02/08/2024   AST 18 02/08/2024   ALT 11 02/08/2024   PROT 8.9 (H) 02/08/2024   ALBUMIN 3.8 02/08/2024   CALCIUM 8.9 02/08/2024   ANIONGAP 8 10/14/2023   EGFR 48 (L) 02/08/2024   GFR 63.85 03/17/2015   Lab Results  Component Value Date   CHOL 188 07/12/2023   Lab Results  Component Value Date   HDL 61 07/12/2023   Lab Results  Component Value Date   LDLCALC 113 (H) 07/12/2023   Lab Results  Component Value Date   TRIG 77 07/12/2023   Lab Results  Component Value Date   CHOLHDL 3.1 07/12/2023   Lab Results  Component Value Date   HGBA1C 7.3 (H) 02/08/2024      Assessment & Plan:   Problem List Items Addressed This Visit       Cardiovascular and Mediastinum   Essential hypertension   BP Readings from Last 1 Encounters:  06/07/24 (!) 112/54   Dizzy spells and low diastolic BP Usually well-controlled with irbesartan  300 mg QD and hydralazine  50 mg TID Advised to take Hydralazine  50 mg BID for now Advised to contact if BP more than 160/90 Counseled for compliance with the  medications Advised DASH diet and moderate exercise/walking as tolerated      Relevant Medications   hydrALAZINE  (APRESOLINE ) 50 MG tablet   Other Relevant Orders   CBC with Differential/Platelet   CMP14+EGFR   PAF (paroxysmal atrial fibrillation) (HCC)   On flecainide  and Xarelto  - advised to contact cardiology office for Xarelto  Needs to get flecainide  refilled from pharmacy Followed by cardiology      Relevant Medications   hydrALAZINE  (APRESOLINE ) 50 MG tablet     Digestive   Gastroesophageal reflux disease   Well-controlled with pantoprazole  Also takes Pepcid  40 mg once daily        Endocrine   Type 2  diabetes mellitus with other specified complication (HCC)   Lab Results  Component Value Date   HGBA1C 7.3 (H) 02/08/2024   Well-controlled Associated with HTN and HLD On glipizide  10 mg twice daily Has stopped taking metformin  1000 mg once daily, and does not want to take it now despite counseling Advised to follow diabetic diet On statin and ARB F/u CMP and lipid panel Diabetic eye exam: Advised to follow up with Ophthalmology for diabetic eye exam      Relevant Orders   CMP14+EGFR   Hemoglobin A1c     Genitourinary   Chronic kidney disease, stage 3b (HCC)   Last BMP reviewed, GFR stable around 40 Avoid nephrotoxic agents On ARB Checked urine microalbumin/creatinine ratio Check CMP today      Relevant Orders   CBC with Differential/Platelet   CMP14+EGFR     Other   Encounter for general adult medical examination with abnormal findings - Primary   Physical exam as documented. Blood tests today. Has had Shingrix vaccines at [facility] - date unknown.      Dizziness   Episodes of dizziness/room spinning sensation were initially thought to be due to vertigo Meclizine  as needed for dizziness, she has adequate supply currently -room spinning sensation has improved now Decreased dose of hydralazine  due to low diastolic BP Avoid sudden positional  changes Advised to maintain at least 64 ounces of fluid intake in a day and eat at regular intervals Simple vestibular exercises advised, video material link provided      Other Visit Diagnoses       Encounter for immunization       Relevant Orders   Flu vaccine HIGH DOSE PF(Fluzone Trivalent) (Completed)           Meds ordered this encounter  Medications   hydrALAZINE  (APRESOLINE ) 50 MG tablet    Sig: Take 1 tablet (50 mg total) by mouth 2 (two) times daily.    Follow-up: Return in about 4 months (around 10/06/2024) for HTN and DM.    Suzzane MARLA Blanch, MD

## 2024-06-07 NOTE — Assessment & Plan Note (Signed)
 BP Readings from Last 1 Encounters:  06/07/24 (!) 112/54   Dizzy spells and low diastolic BP Usually well-controlled with irbesartan  300 mg QD and hydralazine  50 mg TID Advised to take Hydralazine  50 mg BID for now Advised to contact if BP more than 160/90 Counseled for compliance with the medications Advised DASH diet and moderate exercise/walking as tolerated

## 2024-06-07 NOTE — Patient Instructions (Signed)
 Please start taking Hydralazine  50 mg twice daily instead of 3 times.  Please continue to take medications as prescribed.  Please continue to follow low carb diet and perform moderate exercise/walking at least 150 mins/week.

## 2024-06-07 NOTE — Assessment & Plan Note (Signed)
 Lab Results  Component Value Date   HGBA1C 7.3 (H) 02/08/2024   Well-controlled Associated with HTN and HLD On glipizide  10 mg twice daily Has stopped taking metformin  1000 mg once daily, and does not want to take it now despite counseling Advised to follow diabetic diet On statin and ARB F/u CMP and lipid panel Diabetic eye exam: Advised to follow up with Ophthalmology for diabetic eye exam

## 2024-06-07 NOTE — Assessment & Plan Note (Signed)
 Episodes of dizziness/room spinning sensation were initially thought to be due to vertigo Meclizine  as needed for dizziness, she has adequate supply currently -room spinning sensation has improved now Decreased dose of hydralazine  due to low diastolic BP Avoid sudden positional changes Advised to maintain at least 64 ounces of fluid intake in a day and eat at regular intervals Simple vestibular exercises advised, video material link provided

## 2024-06-07 NOTE — Assessment & Plan Note (Signed)
 Last BMP reviewed, GFR stable around 40 Avoid nephrotoxic agents On ARB Checked urine microalbumin/creatinine ratio Check CMP today

## 2024-06-08 ENCOUNTER — Ambulatory Visit: Payer: Self-pay | Admitting: Internal Medicine

## 2024-06-08 LAB — CBC WITH DIFFERENTIAL/PLATELET
Basophils Absolute: 0 x10E3/uL (ref 0.0–0.2)
Basos: 0 %
EOS (ABSOLUTE): 0 x10E3/uL (ref 0.0–0.4)
Eos: 0 %
Hematocrit: 37.5 % (ref 34.0–46.6)
Hemoglobin: 11.7 g/dL (ref 11.1–15.9)
Immature Grans (Abs): 0 x10E3/uL (ref 0.0–0.1)
Immature Granulocytes: 0 %
Lymphocytes Absolute: 2.3 x10E3/uL (ref 0.7–3.1)
Lymphs: 53 %
MCH: 27.2 pg (ref 26.6–33.0)
MCHC: 31.2 g/dL — ABNORMAL LOW (ref 31.5–35.7)
MCV: 87 fL (ref 79–97)
Monocytes Absolute: 0.3 x10E3/uL (ref 0.1–0.9)
Monocytes: 6 %
Neutrophils Absolute: 1.8 x10E3/uL (ref 1.4–7.0)
Neutrophils: 41 %
Platelets: 197 x10E3/uL (ref 150–450)
RBC: 4.3 x10E6/uL (ref 3.77–5.28)
RDW: 14.4 % (ref 11.7–15.4)
WBC: 4.4 x10E3/uL (ref 3.4–10.8)

## 2024-06-08 LAB — CMP14+EGFR
ALT: 10 IU/L (ref 0–32)
AST: 18 IU/L (ref 0–40)
Albumin: 3.9 g/dL (ref 3.7–4.7)
Alkaline Phosphatase: 99 IU/L (ref 48–129)
BUN/Creatinine Ratio: 15 (ref 12–28)
BUN: 20 mg/dL (ref 8–27)
Bilirubin Total: 0.7 mg/dL (ref 0.0–1.2)
CO2: 21 mmol/L (ref 20–29)
Calcium: 9 mg/dL (ref 8.7–10.3)
Chloride: 102 mmol/L (ref 96–106)
Creatinine, Ser: 1.33 mg/dL — ABNORMAL HIGH (ref 0.57–1.00)
Globulin, Total: 4.6 g/dL — ABNORMAL HIGH (ref 1.5–4.5)
Glucose: 255 mg/dL — ABNORMAL HIGH (ref 70–99)
Potassium: 4.8 mmol/L (ref 3.5–5.2)
Sodium: 137 mmol/L (ref 134–144)
Total Protein: 8.5 g/dL (ref 6.0–8.5)
eGFR: 39 mL/min/1.73 — ABNORMAL LOW

## 2024-06-08 LAB — HEMOGLOBIN A1C
Est. average glucose Bld gHb Est-mCnc: 157 mg/dL
Hgb A1c MFr Bld: 7.1 % — ABNORMAL HIGH (ref 4.8–5.6)

## 2024-06-29 ENCOUNTER — Telehealth: Payer: Self-pay | Admitting: Pharmacy Technician

## 2024-06-29 ENCOUNTER — Encounter: Payer: Self-pay | Admitting: Internal Medicine

## 2024-06-29 NOTE — Telephone Encounter (Signed)
" ° °  Xarelto  approval [facility] 2026  "

## 2024-08-01 ENCOUNTER — Ambulatory Visit: Payer: Medicare PPO

## 2024-10-11 ENCOUNTER — Ambulatory Visit: Payer: Self-pay | Admitting: Internal Medicine
